# Patient Record
Sex: Female | Born: 1945 | Race: Black or African American | Hispanic: No | Smoking: Never smoker
Health system: Southern US, Community
[De-identification: ages and names within clinical notes are randomized; demographics above are authoritative.]

## PROBLEM LIST (undated history)

## (undated) DIAGNOSIS — T8859XA Other complications of anesthesia, initial encounter: Secondary | ICD-10-CM

## (undated) DIAGNOSIS — K2970 Gastritis, unspecified, without bleeding: Secondary | ICD-10-CM

## (undated) DIAGNOSIS — G971 Other reaction to spinal and lumbar puncture: Secondary | ICD-10-CM

## (undated) DIAGNOSIS — F985 Adult onset fluency disorder: Secondary | ICD-10-CM

## (undated) DIAGNOSIS — F329 Major depressive disorder, single episode, unspecified: Secondary | ICD-10-CM

## (undated) DIAGNOSIS — K469 Unspecified abdominal hernia without obstruction or gangrene: Secondary | ICD-10-CM

## (undated) DIAGNOSIS — Z7409 Other reduced mobility: Secondary | ICD-10-CM

## (undated) DIAGNOSIS — Z9289 Personal history of other medical treatment: Secondary | ICD-10-CM

## (undated) DIAGNOSIS — E669 Obesity, unspecified: Secondary | ICD-10-CM

## (undated) DIAGNOSIS — A048 Other specified bacterial intestinal infections: Secondary | ICD-10-CM

## (undated) DIAGNOSIS — E119 Type 2 diabetes mellitus without complications: Secondary | ICD-10-CM

## (undated) DIAGNOSIS — R413 Other amnesia: Secondary | ICD-10-CM

## (undated) DIAGNOSIS — K635 Polyp of colon: Secondary | ICD-10-CM

## (undated) DIAGNOSIS — M1990 Unspecified osteoarthritis, unspecified site: Secondary | ICD-10-CM

## (undated) DIAGNOSIS — IMO0001 Reserved for inherently not codable concepts without codable children: Secondary | ICD-10-CM

## (undated) DIAGNOSIS — Z87442 Personal history of urinary calculi: Secondary | ICD-10-CM

## (undated) DIAGNOSIS — G4733 Obstructive sleep apnea (adult) (pediatric): Secondary | ICD-10-CM

## (undated) DIAGNOSIS — K2270 Barrett's esophagus without dysplasia: Secondary | ICD-10-CM

## (undated) DIAGNOSIS — M7022 Olecranon bursitis, left elbow: Secondary | ICD-10-CM

## (undated) DIAGNOSIS — I2510 Atherosclerotic heart disease of native coronary artery without angina pectoris: Secondary | ICD-10-CM

## (undated) DIAGNOSIS — M810 Age-related osteoporosis without current pathological fracture: Secondary | ICD-10-CM

## (undated) DIAGNOSIS — T4145XA Adverse effect of unspecified anesthetic, initial encounter: Secondary | ICD-10-CM

## (undated) DIAGNOSIS — I471 Supraventricular tachycardia, unspecified: Secondary | ICD-10-CM

## (undated) DIAGNOSIS — D649 Anemia, unspecified: Secondary | ICD-10-CM

## (undated) DIAGNOSIS — H409 Unspecified glaucoma: Secondary | ICD-10-CM

## (undated) DIAGNOSIS — M1991 Primary osteoarthritis, unspecified site: Secondary | ICD-10-CM

## (undated) DIAGNOSIS — G629 Polyneuropathy, unspecified: Secondary | ICD-10-CM

## (undated) DIAGNOSIS — D509 Iron deficiency anemia, unspecified: Secondary | ICD-10-CM

## (undated) DIAGNOSIS — G4730 Sleep apnea, unspecified: Secondary | ICD-10-CM

## (undated) DIAGNOSIS — F32A Depression, unspecified: Secondary | ICD-10-CM

## (undated) DIAGNOSIS — K5790 Diverticulosis of intestine, part unspecified, without perforation or abscess without bleeding: Secondary | ICD-10-CM

## (undated) DIAGNOSIS — I10 Essential (primary) hypertension: Secondary | ICD-10-CM

## (undated) DIAGNOSIS — F419 Anxiety disorder, unspecified: Secondary | ICD-10-CM

## (undated) HISTORY — PX: EXTRACORPOREAL SHOCK WAVE LITHOTRIPSY: SHX1557

## (undated) HISTORY — DX: Obesity, unspecified: E66.9

## (undated) HISTORY — DX: Olecranon bursitis, left elbow: M70.22

## (undated) HISTORY — DX: Unspecified osteoarthritis, unspecified site: M19.90

## (undated) HISTORY — DX: Other specified bacterial intestinal infections: A04.8

## (undated) HISTORY — DX: Adult onset fluency disorder: F98.5

## (undated) HISTORY — PX: UMBILICAL HERNIA REPAIR: SHX196

## (undated) HISTORY — DX: Obstructive sleep apnea (adult) (pediatric): G47.33

## (undated) HISTORY — DX: Type 2 diabetes mellitus without complications: E11.9

## (undated) HISTORY — DX: Age-related osteoporosis without current pathological fracture: M81.0

## (undated) HISTORY — DX: Diverticulosis of intestine, part unspecified, without perforation or abscess without bleeding: K57.90

## (undated) HISTORY — DX: Barrett's esophagus without dysplasia: K22.70

## (undated) HISTORY — DX: Primary osteoarthritis, unspecified site: M19.91

## (undated) HISTORY — DX: Other amnesia: R41.3

## (undated) HISTORY — PX: TUBAL LIGATION: SHX77

## (undated) HISTORY — DX: Atherosclerotic heart disease of native coronary artery without angina pectoris: I25.10

## (undated) HISTORY — DX: Morbid (severe) obesity due to excess calories: E66.01

## (undated) HISTORY — PX: OTHER SURGICAL HISTORY: SHX169

## (undated) HISTORY — DX: Supraventricular tachycardia: I47.1

## (undated) HISTORY — DX: Essential (primary) hypertension: I10

## (undated) HISTORY — DX: Other reduced mobility: Z74.09

## (undated) HISTORY — DX: Gastritis, unspecified, without bleeding: K29.70

## (undated) HISTORY — PX: HERNIA REPAIR: SHX51

## (undated) HISTORY — PX: CHOLECYSTECTOMY: SHX55

## (undated) HISTORY — DX: Anemia, unspecified: D64.9

## (undated) HISTORY — DX: Supraventricular tachycardia, unspecified: I47.10

## (undated) HISTORY — PX: KNEE ARTHROSCOPY: SUR90

## (undated) HISTORY — DX: Iron deficiency anemia, unspecified: D50.9

## (undated) HISTORY — DX: Sleep apnea, unspecified: G47.30

## (undated) HISTORY — DX: Unspecified abdominal hernia without obstruction or gangrene: K46.9

## (undated) HISTORY — DX: Depression, unspecified: F32.A

## (undated) HISTORY — DX: Major depressive disorder, single episode, unspecified: F32.9

## (undated) HISTORY — DX: Polyp of colon: K63.5

## (undated) SURGERY — Surgical Case
Anesthesia: *Unknown

---

## 1984-01-14 DIAGNOSIS — G971 Other reaction to spinal and lumbar puncture: Secondary | ICD-10-CM

## 1984-01-14 HISTORY — DX: Other reaction to spinal and lumbar puncture: G97.1

## 2001-01-13 HISTORY — PX: CARDIAC CATHETERIZATION: SHX172

## 2001-03-21 ENCOUNTER — Inpatient Hospital Stay (HOSPITAL_COMMUNITY): Admission: EM | Admit: 2001-03-21 | Discharge: 2001-03-24 | Payer: Self-pay | Admitting: Emergency Medicine

## 2001-03-21 ENCOUNTER — Encounter: Payer: Self-pay | Admitting: Emergency Medicine

## 2005-11-18 ENCOUNTER — Encounter: Admission: RE | Admit: 2005-11-18 | Discharge: 2005-12-22 | Payer: Self-pay | Admitting: Orthopedic Surgery

## 2005-12-23 ENCOUNTER — Encounter: Admission: RE | Admit: 2005-12-23 | Discharge: 2006-01-14 | Payer: Self-pay | Admitting: Orthopedic Surgery

## 2006-01-15 ENCOUNTER — Encounter: Admission: RE | Admit: 2006-01-15 | Discharge: 2006-03-31 | Payer: Self-pay | Admitting: Orthopedic Surgery

## 2006-03-13 ENCOUNTER — Encounter: Admission: RE | Admit: 2006-03-13 | Discharge: 2006-03-31 | Payer: Self-pay | Admitting: Orthopedic Surgery

## 2006-08-04 ENCOUNTER — Ambulatory Visit: Payer: Self-pay | Admitting: Physical Medicine & Rehabilitation

## 2006-08-04 ENCOUNTER — Encounter
Admission: RE | Admit: 2006-08-04 | Discharge: 2006-11-02 | Payer: Self-pay | Admitting: Physical Medicine & Rehabilitation

## 2006-09-17 ENCOUNTER — Ambulatory Visit: Payer: Self-pay | Admitting: Physical Medicine & Rehabilitation

## 2006-10-26 ENCOUNTER — Ambulatory Visit: Payer: Self-pay | Admitting: Internal Medicine

## 2006-10-27 ENCOUNTER — Encounter: Payer: Self-pay | Admitting: Internal Medicine

## 2006-10-27 ENCOUNTER — Ambulatory Visit: Payer: Self-pay | Admitting: Internal Medicine

## 2006-11-23 ENCOUNTER — Ambulatory Visit (HOSPITAL_COMMUNITY): Admission: RE | Admit: 2006-11-23 | Discharge: 2006-11-23 | Payer: Self-pay | Admitting: General Surgery

## 2006-11-23 ENCOUNTER — Encounter (HOSPITAL_BASED_OUTPATIENT_CLINIC_OR_DEPARTMENT_OTHER): Payer: Self-pay | Admitting: General Surgery

## 2006-12-15 DIAGNOSIS — M170 Bilateral primary osteoarthritis of knee: Secondary | ICD-10-CM | POA: Insufficient documentation

## 2006-12-15 DIAGNOSIS — I2510 Atherosclerotic heart disease of native coronary artery without angina pectoris: Secondary | ICD-10-CM | POA: Insufficient documentation

## 2007-03-01 DIAGNOSIS — F330 Major depressive disorder, recurrent, mild: Secondary | ICD-10-CM | POA: Insufficient documentation

## 2007-03-25 DIAGNOSIS — D126 Benign neoplasm of colon, unspecified: Secondary | ICD-10-CM | POA: Insufficient documentation

## 2007-03-25 DIAGNOSIS — F32A Depression, unspecified: Secondary | ICD-10-CM | POA: Insufficient documentation

## 2007-03-25 DIAGNOSIS — Z87442 Personal history of urinary calculi: Secondary | ICD-10-CM | POA: Insufficient documentation

## 2007-03-25 DIAGNOSIS — IMO0001 Reserved for inherently not codable concepts without codable children: Secondary | ICD-10-CM | POA: Insufficient documentation

## 2007-03-25 DIAGNOSIS — F329 Major depressive disorder, single episode, unspecified: Secondary | ICD-10-CM

## 2007-03-25 DIAGNOSIS — I2510 Atherosclerotic heart disease of native coronary artery without angina pectoris: Secondary | ICD-10-CM | POA: Insufficient documentation

## 2007-03-25 DIAGNOSIS — E1149 Type 2 diabetes mellitus with other diabetic neurological complication: Secondary | ICD-10-CM

## 2007-03-25 DIAGNOSIS — I10 Essential (primary) hypertension: Secondary | ICD-10-CM

## 2007-03-25 DIAGNOSIS — K649 Unspecified hemorrhoids: Secondary | ICD-10-CM | POA: Insufficient documentation

## 2007-03-25 DIAGNOSIS — K5730 Diverticulosis of large intestine without perforation or abscess without bleeding: Secondary | ICD-10-CM | POA: Insufficient documentation

## 2007-03-25 DIAGNOSIS — G4730 Sleep apnea, unspecified: Secondary | ICD-10-CM

## 2007-03-25 DIAGNOSIS — F3289 Other specified depressive episodes: Secondary | ICD-10-CM | POA: Insufficient documentation

## 2007-03-25 DIAGNOSIS — G4733 Obstructive sleep apnea (adult) (pediatric): Secondary | ICD-10-CM | POA: Insufficient documentation

## 2007-03-31 ENCOUNTER — Encounter: Admission: RE | Admit: 2007-03-31 | Discharge: 2007-03-31 | Payer: Self-pay | Admitting: Family Medicine

## 2007-04-07 DIAGNOSIS — N3946 Mixed incontinence: Secondary | ICD-10-CM | POA: Insufficient documentation

## 2007-05-21 DIAGNOSIS — E1122 Type 2 diabetes mellitus with diabetic chronic kidney disease: Secondary | ICD-10-CM | POA: Insufficient documentation

## 2007-05-29 ENCOUNTER — Encounter: Admission: RE | Admit: 2007-05-29 | Discharge: 2007-05-29 | Payer: Self-pay | Admitting: Family Medicine

## 2008-04-04 ENCOUNTER — Encounter: Admission: RE | Admit: 2008-04-04 | Discharge: 2008-04-04 | Payer: Self-pay | Admitting: Obstetrics & Gynecology

## 2008-04-11 ENCOUNTER — Encounter: Admission: RE | Admit: 2008-04-11 | Discharge: 2008-04-11 | Payer: Self-pay | Admitting: Obstetrics & Gynecology

## 2008-07-31 ENCOUNTER — Encounter
Admission: RE | Admit: 2008-07-31 | Discharge: 2008-10-29 | Payer: Self-pay | Admitting: Physical Medicine & Rehabilitation

## 2008-08-02 ENCOUNTER — Ambulatory Visit: Payer: Self-pay | Admitting: Physical Medicine & Rehabilitation

## 2008-09-06 ENCOUNTER — Ambulatory Visit: Payer: Self-pay | Admitting: Physical Medicine & Rehabilitation

## 2008-09-13 ENCOUNTER — Encounter
Admission: RE | Admit: 2008-09-13 | Discharge: 2008-12-12 | Payer: Self-pay | Admitting: Physical Medicine & Rehabilitation

## 2008-11-28 ENCOUNTER — Encounter
Admission: RE | Admit: 2008-11-28 | Discharge: 2009-01-04 | Payer: Self-pay | Admitting: Physical Medicine & Rehabilitation

## 2008-11-29 ENCOUNTER — Ambulatory Visit: Payer: Self-pay | Admitting: Physical Medicine & Rehabilitation

## 2009-03-02 ENCOUNTER — Encounter
Admission: RE | Admit: 2009-03-02 | Discharge: 2009-05-31 | Payer: Self-pay | Admitting: Physical Medicine & Rehabilitation

## 2009-04-05 ENCOUNTER — Encounter: Admission: RE | Admit: 2009-04-05 | Discharge: 2009-04-05 | Payer: Self-pay | Admitting: Obstetrics & Gynecology

## 2009-04-06 ENCOUNTER — Ambulatory Visit: Payer: Self-pay | Admitting: Physical Medicine & Rehabilitation

## 2009-04-11 ENCOUNTER — Encounter
Admission: RE | Admit: 2009-04-11 | Discharge: 2009-07-05 | Payer: Self-pay | Admitting: Physical Medicine & Rehabilitation

## 2009-06-29 ENCOUNTER — Encounter
Admission: RE | Admit: 2009-06-29 | Discharge: 2009-07-06 | Payer: Self-pay | Admitting: Physical Medicine & Rehabilitation

## 2009-07-06 ENCOUNTER — Ambulatory Visit: Payer: Self-pay | Admitting: Physical Medicine & Rehabilitation

## 2009-10-25 ENCOUNTER — Encounter
Admission: RE | Admit: 2009-10-25 | Discharge: 2010-01-23 | Payer: Self-pay | Source: Home / Self Care | Attending: Physical Medicine & Rehabilitation | Admitting: Physical Medicine & Rehabilitation

## 2009-11-02 ENCOUNTER — Ambulatory Visit: Payer: Self-pay | Admitting: Physical Medicine & Rehabilitation

## 2009-11-16 ENCOUNTER — Ambulatory Visit: Payer: Self-pay | Admitting: Physical Medicine & Rehabilitation

## 2009-11-20 ENCOUNTER — Ambulatory Visit (HOSPITAL_COMMUNITY)
Admission: RE | Admit: 2009-11-20 | Discharge: 2009-11-20 | Payer: Self-pay | Source: Home / Self Care | Admitting: Physical Medicine & Rehabilitation

## 2010-02-03 ENCOUNTER — Encounter: Payer: Self-pay | Admitting: Family Medicine

## 2010-02-03 ENCOUNTER — Encounter: Payer: Self-pay | Admitting: Obstetrics & Gynecology

## 2010-03-04 ENCOUNTER — Other Ambulatory Visit: Payer: Self-pay | Admitting: *Deleted

## 2010-03-04 DIAGNOSIS — Z1231 Encounter for screening mammogram for malignant neoplasm of breast: Secondary | ICD-10-CM

## 2010-03-05 ENCOUNTER — Encounter: Payer: Medicare Other | Attending: Physical Medicine & Rehabilitation

## 2010-03-05 ENCOUNTER — Ambulatory Visit: Payer: Medicare Other | Admitting: Physical Medicine & Rehabilitation

## 2010-03-05 DIAGNOSIS — M1610 Unilateral primary osteoarthritis, unspecified hip: Secondary | ICD-10-CM

## 2010-03-05 DIAGNOSIS — G8929 Other chronic pain: Secondary | ICD-10-CM | POA: Insufficient documentation

## 2010-03-05 DIAGNOSIS — M1710 Unilateral primary osteoarthritis, unspecified knee: Secondary | ICD-10-CM

## 2010-03-05 DIAGNOSIS — M719 Bursopathy, unspecified: Secondary | ICD-10-CM | POA: Insufficient documentation

## 2010-03-05 DIAGNOSIS — M7520 Bicipital tendinitis, unspecified shoulder: Secondary | ICD-10-CM | POA: Insufficient documentation

## 2010-03-05 DIAGNOSIS — E669 Obesity, unspecified: Secondary | ICD-10-CM

## 2010-03-05 DIAGNOSIS — F329 Major depressive disorder, single episode, unspecified: Secondary | ICD-10-CM

## 2010-03-05 DIAGNOSIS — M67919 Unspecified disorder of synovium and tendon, unspecified shoulder: Secondary | ICD-10-CM | POA: Insufficient documentation

## 2010-04-15 ENCOUNTER — Ambulatory Visit: Payer: Medicare Other

## 2010-04-17 ENCOUNTER — Inpatient Hospital Stay: Admission: RE | Admit: 2010-04-17 | Payer: Medicare Other | Source: Ambulatory Visit

## 2010-05-28 NOTE — Assessment & Plan Note (Signed)
Jade Ellison is an old patient of mine, who had treated previously for back  and hip pain.  She had been seeing Dr. Dorene Grebe for orthopedic  management of her knee and hips.  Dr. August Saucer had mentioned that it was too  risky to try knee replacement surgery for her and did not have much to  offer her from an orthopedic standpoint.  They tried injections with  little relief.  She is back here and seeking other options for pain  control.  Pain in her knees are most prominent in the left than the  right as well as right hip pain.  The hip and knees are sharp and aching  in nature.  Pain worsens with walking, bending, and prolonged standing.  She feels very limited with her activities.  She has tried to lose  weight and has lost 10 pounds over the last few months.  Her mood has  been poor as she has been stressed at home and she is fed up with her  situation.   The patient states that she can walk about 10 minutes at a time.  She  uses a cane or walker for balance.  She rarely gets out of the  house so  she is afraid of how her appearance is to others.  She notes occasional  bladder control issues.  Pain interferes with general activity,  relations with others, and enjoyment of life on a moderate-to-severe  level as a whole.   CURRENT MEDICATIONS:  1. HCTZ and Actos, which is on hold.  2. Metformin.  3. Crestor.  4. Travatan drops.  5. Amlodipine.  6. Benazepril.  7. Hydrocodone 5/500 one to two per day p.r.n.  8. Aspirin.  9. Omega-3 supplement.   REVIEW OF SYSTEMS:  Notable for depression, anxiety, trouble walking,  and bladder control issues.  Full review is in the written health and  history section of the chart.   PAST MEDICAL HISTORY:  Positive for hypertension, glaucoma, non-insulin  requiring diabetes, arthritis, obesity, and hemorrhoids with removal of  kidney stones.   SOCIAL HISTORY:  The patient is widowed and lives alone.  She did not  disclosed some of her family problems  today.   FAMILY HISTORY:  Positive diabetes, high blood pressure, and heart  disease.   PHYSICAL EXAMINATION:  VITAL SIGNS:  Blood pressure is 129/57, pulse is  91, and respiratory rate is 18.  She is sating 99% on room air.  GENERAL:  The patient is generally pleasant, alert, and oriented x3.  She is obese.  EXTREMITIES:  On examination of the leg, she had external rotation of  the left femur with mild valgus deformity as well.  Less deformity was  noted in the right knee.  Both knees were painful somewhat with resisted  extension and flexion exercises today.  Right hip was painful with  movement particularly with Luisa Hart maneuver.  Strength seemed to be  fairly well preserved except at the left knee, where strength was 4-  4+/5.  There was definitely pain inhibition there.  Strength in the  upper extremities is 5/5.  The patient walk with antalgic gait favoring  both limbs really today.  She had difficulty transferring from a lying  to sitting position and sitting to standing position.  She used a cane  for balance.  HEART:  Regular.  CHEST:  Clear.  ABDOMEN:  Soft, nontender.  The patient was alert and appropriate.  She  is well dressed.  Did  not test her back and leg today for range of  motion or pain.  Both knees had some crepitus with movement, but no  structural instability that I could see on provocative maneuvers today.   ASSESSMENT:  1. Bilateral knee pain and osteoarthritis.  2. Osteoarthritis, right hip.  3. Morbid obesity.  4. Depression.  5. Non-insulin requiring diabetes.   PLAN:  1. Obviously the first thing that is most important for her is to lose      weight.  She is aware of this and at least wants to loose weight.      We will start by sending her to [facility] for diet      and weight loss suggestion and plan.  2. We will initiate low-dose Mobic 7.5 mg daily for any inflammatory      effects to see if we can reduce her pain levels and thus  increase      the activity.  3. I encouraged her to use her hydrocodone for breakthrough pain and      perhaps schedule a pill prior to her morning exercise.  4. I recommended Glucosamine chondroitin supplements.  She may take      her Omega-3 fatty acids as well.  5. The patient may do well with osteoarthritis braces for the knees to      help unload them.  6. Recommended ongoing aquatic pool-based therapy to help increase her      activity while unloading the knees.  She might do well with a      stationary bike as well.  7. I will see her back in 4-6 week's time.      Ranelle Oyster, M.D.  Electronically Signed     ZTS/MedQ  D:  08/02/2008 12:37:57  T:  08/03/2008 01:59:24  Job #:  [id number]   cc:   G. Dorene Grebe, M.D.  Fax: [phone]

## 2010-05-28 NOTE — Assessment & Plan Note (Signed)
Jade Ellison is back regarding her chronic knee and back pain.  She has done  a bit better with the Mobic and glucosamine supplements.  She is  enrolled in weight loss course next week and she has been working on  better diet.  She has lost a bit of weight.  She uses hydrocodone for  more severe pain, but tries to be sparing with it due to the  constipation side effects.  Her pain is 7-8/10.  Pain interferes with  general activity, relations with others, enjoyment of life on a moderate  level.  Sleep is fair.   REVIEW OF SYSTEMS:  Notable for trouble walking, depression, anxiety.  Full 14-point review is in the written health and history section of the  chart.   SOCIAL HISTORY:  Unchanged.  She did go on a trip with her daughter  requiring walking and this was tough for her.   PHYSICAL EXAMINATION:  VITAL SIGNS:  Blood pressure is 125/71, pulse is  87, respiratory rate 18.  She is sating 98% on room air.  GENERAL:  The patient is pleasant, alert, and oriented x3.  Affect is  bright and appropriate.  She remains overweight.  She uses  her cane for  gait and is antalgic bilaterally, but more on the left.  She has  significant valgus deformity of the left knee and pain over the medial  aspect.  Pain is more notable with flexion than extension today.  Strength is inhibited at knee extension and flexion due to pain and  rated strength there is 3-4/5.  Otherwise strength is in the 5/5 range.  Sensory exam is normal.  Cognitively, she is intact.  HEART:  Regular.  CHEST:  Clear.  ABDOMEN:  Soft, nontender.   ASSESSMENT:  1. Bilateral knee pain left greater than right with osteoarthritis.  2. Osteoarthritis, right hip.  3. Morbid obesity.  4. Diabetes type 2.   PLAN:  1. Continue weight loss and diet efforts.  She understands that this      is a long-term process.  She seems to have realistic goals.  2. Mobic 7.5 mg daily.  3. Hydrocodone 5/500 for breakthrough pain one daily p.r.n.  4.  Continue supplements.  5. We will send her through [facility] for osteoarthritis brace      with the medial brace for the left leg.  Then, this will be very      helpful for her.  6. I will see her back in about 3 months.      Ranelle Oyster, M.D.  Electronically Signed    ZTS/MedQ  D:  09/06/2008 10:51:36  T:  09/07/2008 03:08:04  Job #:  [id number]   cc:   G. Dorene Grebe, M.D.  Fax: [phone]

## 2010-05-28 NOTE — Op Note (Signed)
NAMETARRIE, MCMICHEN              ACCOUNT NO.:  [id number]   MEDICAL RECORD NO.:  [id number]          PATIENT TYPE:  AMB   LOCATION:  SDS                          FACILITY:  [facility]   PHYSICIAN:  Leonie Man, M.D.   DATE OF BIRTH:  [birth year]   DATE OF PROCEDURE:  11/23/2006  DATE OF DISCHARGE:                               OPERATIVE REPORT   PREOPERATIVE DIAGNOSIS:  Anal polyp involving internal hemorrhoid.   POSTOPERATIVE DIAGNOSIS:  Anal polyp involving internal hemorrhoid.   PROCEDURE:  Hemorrhoidectomy with polypectomy   SURGEON:  Leonie Man, M.D.   ASSISTANT:  O.R. nurse.   ANESTHESIA:  General.   The patient is a 65 year old morbidly obese female presenting with anal  drainage.  She has recently undergone colonoscopy and polypectomy.  She  presented with a prolapsed polyp coming through the anal verge.  She  comes to the operating room for excision after the risks and potential  benefits of surgery have been discussed, all questions answered, and  consent obtained.   PROCEDURE:  Following the induction of satisfactory general anesthesia,  the patient is positioned in lithotomy position and the perianal tissues  prepped and draped to be included in the sterile operative field.  Positive identification of the patient and the procedure is carried out.  The anal verge is dilated slightly to 2 fingerbreadths, and the  prolapsing polyp was grasped with a Pennington forceps, and the mucosal  prolapse is brought down out of the anus.  This area is then infiltrated  with 0.5% Marcaine with epinephrine.  Using electrocautery, the entire  hemorrhoid inclusive of the polyp is dissected away from the underlying  sphincters and removed for pathologic evaluation.  Hemostasis obtained  with electrocautery.  The mucosa and mucocutaneous junction is closed  with a running suture of 3-0 chromic catgut.  A Gelfoam pad is placed  within the anus over the incision line for additional  hemostasis.  Sponge and instrument counts are verified, the anesthetic reversed, and  the patient removed from the operating room to the recovery room in  stable condition.  She tolerated the procedure well.      Leonie Man, M.D.  Electronically Signed     PB/MEDQ  D:  11/23/2006  T:  11/23/2006  Job:  [id number]

## 2010-05-28 NOTE — Assessment & Plan Note (Signed)
[facility]                         GASTROENTEROLOGY OFFICE NOTE   LUJAIN, KRASZEWSKI                     MRN:          [id number]  DATE:10/26/2006                            DOB:          [birth year]    Ms. Vicens is a very nice 65 year old African American female who is  here today because of intermittent rectal bleeding and because she has  been evaluated by Dr. Leonie Man for hemorrhoidectomy and was told  to have a colonoscopy first.  Ms. Covell has been followed by Dr.  Tamela Oddi.  She has a coronary artery disease, diabetes mellitus on  oral hypoglycemic agents.  She has also high blood pressure and obesity.  She was diagnosed with sleep apnea and recently underwent lithotripsy.  She has 3-4 bowel movements a day which is quite different than it used  to be.  he is having more loose stools and urgent stools.  She is status  post cholecystectomy.  She has also total knee replacements in the past.   MEDICATIONS:  1. Zoloft 100 mg daily.  2. Metformin 1,000 mg daily.  3. Crestor 10 mg p.o. daily.  4. Quinapril 40 mg p.o. daily.  5. Actos 30 mg p.o. daily.  6. Tiazac 180 mg daily.  7. HCTZ 40 mg p.o. daily.  8. Xalatan drops.  9. Percocet.  10.Naprosyn.  11.Omega-3.  12.Glucosamine.   PAST HISTORY:  1. The patient had a colonoscopy at [facility] more than 10      years ago but does not know who did it and what was the outcome.      She also might have had an upper endoscopy years ago in      [place], [place].  2. Past history is significant for kidney stones.  3. High blood pressure.  4. Obesity.  5. Diabetes for 22 years.  6. Depression.  7. She had an open cholecystectomy in 1995.  8. Hernia surgery in 1984.  9. Tubal ligation in 1986.  10.She was found to have __________  of the arteries to her heart      after having a cardiac catheterization.  11.She also had scar tissue removed on her left lung.   FAMILY HISTORY:  Positive for diabetes in brother and sister, heart  disease in mother and father.   SOCIAL HISTORY:  She has one child.  She has some college education.  She does not smoke and drinks alcohol socially.   REVIEW OF SYSTEMS:  Positive for obesity, swelling of her feet, frequent  cough, severe fatigue, night sweats, excessive urination, blood in her  urine, shortness of breath.   PHYSICAL EXAMINATION:  VITAL SIGNS:  Blood pressure 114/70, pulse 60,  and weight was 300 pounds.  GENERAL:  She was quite obese, very nice, pleasant, alert and oriented.  EYES:  Sclerae nonicteric.  NECK:  Supple.  No adenopathy.  LUNGS:  Clear to auscultation.  COR:  Normal S1, normal S2.  ABDOMEN:  Protuberant, very obese, soft with post cholecystectomy scar  in the right upper quadrant.  There was no tenderness on her abdominal  exam which  was quite limited because of the enormous size it.  She had a  periumbilical hernia.  RECTAL:  Shows a prolapsing 2nd degree hemorrhoid which was quite  erythematous, injected, and had some contact bleeding.  It was reducible  by pushing it into the rectal os and rectal ampulla but it prolapsed  spontaneously again.  There was no stool in the rectal ampulla to check  for blood.  No other hemorrhoids were noted.  EXTREMITIES:  No edema.   IMPRESSION:  86. A 65 year old African American female with a change in bowel habits      which could be related to either her medications such as Metformin      or possibly due to post cholecystectomy state.  This could be due      to irritable bowel syndrome as well.  2. Rectal bleeding.  May be related to protruding 2nd degree      hemorrhoid but at her age of 31 she needs to be evaluated for colon      polyps/colon cancer.  3. Diabetes mellitus.  4. High blood pressure.  5. Obesity.  6. Coronary artery disease by history.  7. Sleep apnea.  8. Status post left sided lithotripsy.   PLAN:  1. Colonoscopy has  been discussed with the patient.  She will use the      routine colonoscopy prep with modification of her diabetic      medications.  2. Anusol-HC suppositories q.h.s.  3. Bentyl 10 mg p.o. b.i.d. to slow down her bowel movements.  4. Samples of Analpram cream 2.5% given to use for rectal irritation.   I will forward this report to Dr. Lurene Shadow whom she is supposed to see  back for a hemorrhoidectomy.     Hedwig Morton. Juanda Chance, MD  Electronically Signed    DMB/MedQ  DD: 10/26/2006  DT: 10/26/2006  Job #: [id number]   cc:   Roseanna Rainbow, M.D.  Leonie Man, M.D.  Veverly Fells. Al[place]imer, M.D.

## 2010-05-28 NOTE — Procedures (Signed)
NAMELILLAR, [name]              ACCOUNT NO.:  [id number]   MEDICAL RECORD NO.:  [id number]          PATIENT TYPE:  REC   LOCATION:  TPC                          FACILITY:  [facility]   PHYSICIAN:  Ranelle Oyster, M.D.DATE OF BIRTH:  1945/06/10   DATE OF PROCEDURE:  09/18/2006  DATE OF DISCHARGE:                               OPERATIVE REPORT   Jade Ellison is here for Synvisc injections.  ICD-9 code 715.96.   DESCRIPTION OF PROCEDURE:  After informed consent and preparation of the  skin with Betadine, we injected via lateral approach both knees using 2  mL of aqueous Synvisc solution.  The patient tolerated it well.  She was  given patient information regarding the medication itself.  I will see  her back in about 7-10 days for the second of three injections.      Ranelle Oyster, M.D.  Electronically Signed     ZTS/MEDQ  D:  09/18/2006 12:39:19  T:  09/18/2006 13:42:59  Job:  [id number]

## 2010-05-31 NOTE — Cardiovascular Report (Signed)
[place]  Patient:    Jade Ellison, Jade Ellison Visit Number: [id number] MRN: [id number]          Service Type: MED Location: 2000 2010 01 Attending Physician:  Nelta Numbers Dictated by:   Noralyn Pick. Eden Emms, M.D., [facility] Proc. Date: 03/23/01 Admit Date:  03/21/2001   CC:         Gabriel Earing, M.D.  Madolyn Frieze Crenshaw, M.D. [facility]   Cardiac Catheterization  INDICATION:  Recurrent chest pain in a diabetic relieved with nitroglycerin.  PROCEDURE:  Coronary Angiography.  CARDIOLOGIST:  Noralyn Pick. Eden Emms, M.D., [facility]  DESCRIPTION OF PROCEDURE:  Catheterization was done from the right femoral artery.  The patient had somewhat unusual anatomy.  The first inguinal crease was way below the femoral head using fluoroscopy.  Visually our stick seemed quite high, but I made sure we were over the femoral head by fluoroscopy.  RESULTS:  The left main coronary artery was normal.  Left anterior descending artery was normal.  First and second diagonal branches were normal.  The circumflex coronary artery was normal.  Right coronary artery had a 30% discrete lesion in the mid PDA.  For a diabetic, the patient had extremely large arteries.  RAO VENTRICULOGRAPHY:  RAO ventriculography was normal.  Ejection fraction was in the 65% range.  There was no gradient across the aortic valve and no MR. LV pressure was in the 160/18 range.  Aortic pressure was in the 170/68 range.  IMPRESSION:  The patients chest pain would appear to be noncardiac in etiology.  She actually has quite large arteries for a diabetic, and we will observe her for 24 hours since she is at high risk for retroperitoneal bleed given how deep her artery is and her size.  So long as she does not have any drop in her hemoglobin or bleeding, she will discharged in the morning. Dictated by:   Noralyn Pick Eden Emms, M.D., [facility] Attending Physician:  Nelta Numbers DD:   03/23/01 TD:  03/23/01 Job: [id number]

## 2010-05-31 NOTE — Discharge Summary (Signed)
Jade Ellison. [facility]  Patient:    Jade Ellison, Jade Ellison Visit Number: [id number] MRN: [id number]          Service Type: MED Location: 2000 2010 01 Attending Physician:  Nelta Numbers Dictated by:   Jade Ellison, P.A. Admit Date:  03/21/2001 Discharge Date: 03/24/2001   CC:         Dr. Enzo Ellison at [facility]  Dr. Andi Ellison at [facility]   Referring Physician Discharge [facility]  DATE OF BIRTH:  Dec 31, 1945  PROCEDURES: 1. Cardiac catheterization. 2. Coronary arteriogram. 3. Left ventriculogram.  HOSPITAL COURSE:  [name] is a 64 year old female with no known history of coronary artery disease who has multiple risk factors for coronary artery disease and presented to the emergency room with substernal chest pain that radiated to her right neck.  This occurred at approximately 2:45 a.m. on the morning of March 21, 2001.  The patient was seen in the emergency room and admitted to rule out MI and for further evaluation.  She was started on aspirin, heparin, and Zocor was added to her medication regimen.  She also had a low-dose beta blocker added as well.  Her enzymes were negative for MI and she was scheduled for a cardiac catheterization.  The cardiac catheterization was performed on March 23, 2001.  It showed normal coronary arteries with a normal LV and an EF of 55%.  The next day, the patient was ambulating without difficulty and her groin was stable.  There was no further cardiac indicated at this time.  The patient had some hematuria and a urinalysis and culture were done.  The urinalysis did show a urine that was positive for blood and a few epithelial cells as well as a few bacteria, so she was started on empiric antibiotics. The culture, though, came back with less than 10,000 colonies of multiple species felt contaminant and so the antibiotics were discontinued.  The patient had a lipid profile checked to  evaluate her for hyperlipidemia but this was in normal limits with an HDL of 48 and an LDL of 85.  It was felt that no therapy was indicated.  Because she had no further symptoms and her catheterization was without coronary artery disease, she was considered stable for discharge on March 24, 2001.  LABORATORY DATA:  Hemoglobin 13.2, hematocrit 39.4, wbcs 6.0, platelets 177. Total cholesterol 160, triglycerides 86, HDL 58, LDL 85.  Serial CK-MB a day. troponin I negative for MI.  LFTs within normal limits.  Amylase within normal limits.  Sodium 139, potassium 4.2, chloride 107, CO2 26, BUN 9, creatinine 0.7, glucose 153.  Chest x-ray:  No acute abnormalities.  DISCHARGE CONDITION:  Stable.  DISCHARGE DIAGNOSES: 1. Chest pain, no significant coronary artery disease by catheterization this    admission, symptoms resolved. 2. Non-insulin-dependent diabetes mellitus. 3. Hypertension. 4. Obesity. 5. Obstructive sleep apnea. 6. Status post cholecystectomy, cesarean section, total knee replacement, and    bilateral tubal ligation. 7. Family history of coronary artery disease.  DISCHARGE INSTRUCTIONS:  ACTIVITY:  Her activity level is to include no driving, sexual, or strenuous activity for two days.  DIET:  She is to stick to a low fat diabetic diet.  WOUND CARE:  She is to call the office for bleeding, swelling, or drainage at the catheterization site.  FOLLOW-UP:  She is to get a BMET in one week at family physicians office. She is to follow up with Dr. Glennon Ellison  on a p.r.n. basis.  She is to follow up with Dr. Enzo Ellison and Dr. Andi Ellison.  DISCHARGE MEDICATIONS: 1. Glucophage 500 mg b.i.d., restart March 26, 2001. 2. Prandin 2 mg t.i.d. 3. Amaryl 4 mg b.i.d. 4. HCTZ 12.5 mg q.d. 5. Topamax 50 mg q.d. 6. Lisinopril 20 mg q.d. 7. Tiazac 300 mg q.d. Dictated by:   Jade Ellison, P.A. Attending Physician:  Nelta Numbers DD:  03/24/01 TD:  03/24/01 Job:  [id number]

## 2010-06-07 ENCOUNTER — Ambulatory Visit: Payer: Medicare Other

## 2010-06-17 ENCOUNTER — Other Ambulatory Visit: Payer: Self-pay | Admitting: Family Medicine

## 2010-06-17 DIAGNOSIS — Z1231 Encounter for screening mammogram for malignant neoplasm of breast: Secondary | ICD-10-CM

## 2010-06-24 ENCOUNTER — Ambulatory Visit: Payer: Medicare Other

## 2010-07-01 ENCOUNTER — Encounter: Payer: Medicare Other | Attending: Physical Medicine & Rehabilitation | Admitting: Physical Medicine & Rehabilitation

## 2010-07-22 ENCOUNTER — Encounter: Payer: Medicare Other | Attending: Physical Medicine & Rehabilitation | Admitting: Physical Medicine & Rehabilitation

## 2010-07-22 DIAGNOSIS — F329 Major depressive disorder, single episode, unspecified: Secondary | ICD-10-CM

## 2010-07-22 DIAGNOSIS — M1710 Unilateral primary osteoarthritis, unspecified knee: Secondary | ICD-10-CM | POA: Insufficient documentation

## 2010-07-22 DIAGNOSIS — M1610 Unilateral primary osteoarthritis, unspecified hip: Secondary | ICD-10-CM

## 2010-07-22 DIAGNOSIS — E669 Obesity, unspecified: Secondary | ICD-10-CM

## 2010-07-22 DIAGNOSIS — M25569 Pain in unspecified knee: Secondary | ICD-10-CM | POA: Insufficient documentation

## 2010-07-22 DIAGNOSIS — M67919 Unspecified disorder of synovium and tendon, unspecified shoulder: Secondary | ICD-10-CM | POA: Insufficient documentation

## 2010-07-22 DIAGNOSIS — M719 Bursopathy, unspecified: Secondary | ICD-10-CM | POA: Insufficient documentation

## 2010-07-22 NOTE — Assessment & Plan Note (Signed)
Jade Ellison is back regarding her multiple pain issues, particularly the left knee.  She continues to have significant pain and is limited with knee flexion now.  She has hard time getting in her car.  The injection helped in November last year, but she has not had another one since. She uses the hydrocodone for some breakthrough pain relief.  The Voltaren gel and Pennsaid drops were no longer helping.  She does like her brace, but feels limited in its use due to her inability to get into car with it.  REVIEW OF SYSTEMS:  Notable for the above.  She does report some limb swelling, urine retention and depression.  Full 12-point review is written health and history section of the chart.  SOCIAL HISTORY:  The patient is widow, lives alone.  PHYSICAL EXAMINATION:  VITAL SIGNS:  Blood pressure is 147/53, pulse 101, respiratory rate 18 and she is satting 98% on room air. GENERAL:  The patient is pleasant and alert.  Her weight is unchanged. EXTREMITIES:  She has antalgia left more than right leg.  She has significant crepitus in the left knee.  I am unable to flex her past 70- 75 degrees. CARDIAC:  Heart rate is slightly tachycardic. CHEST:  Clear. ABDOMEN:  Soft and nontender.  ASSESSMENT: 1. 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     Arthritis is severe. 2. Right rotator cuff syndrome/bicipital tendonitis/bursitis. 3. Morbid obesity.  PLAN: 1. I discussed options including total knee replacement.  She would     like to hold off on this and work on her weight further.  Discussed     range of motion and appropriate shoe wear, use of her brace, etc. 2. I increased her hydrocodone to 7.5 to use q.6-8 h. p.r.n. 3. Discussed low impact activities such as pool walking, a stationary     bike, etc. which she can try. 4. I will see her back here in about 3 months.     Ranelle Oyster, M.D. Electronically Signed    ZTS/MedQ D:  07/22/2010 13:24:54  T:   07/22/2010 22:37:24  Job #:  [id number]  cc:   Devra Dopp, MD

## 2010-09-27 ENCOUNTER — Telehealth: Payer: Self-pay | Admitting: Internal Medicine

## 2010-09-27 NOTE — Telephone Encounter (Signed)
Patient scheduled on 10/02/10 at 9:15 AM. Lurena Joiner to fax records to [place].

## 2010-09-30 ENCOUNTER — Encounter: Payer: Self-pay | Admitting: *Deleted

## 2010-10-02 ENCOUNTER — Encounter: Payer: Self-pay | Admitting: *Deleted

## 2010-10-02 ENCOUNTER — Other Ambulatory Visit (INDEPENDENT_AMBULATORY_CARE_PROVIDER_SITE_OTHER): Payer: Medicare Other

## 2010-10-02 ENCOUNTER — Encounter: Payer: Self-pay | Admitting: Internal Medicine

## 2010-10-02 ENCOUNTER — Telehealth: Payer: Self-pay | Admitting: *Deleted

## 2010-10-02 ENCOUNTER — Ambulatory Visit (INDEPENDENT_AMBULATORY_CARE_PROVIDER_SITE_OTHER): Payer: Medicare Other | Admitting: Internal Medicine

## 2010-10-02 DIAGNOSIS — D649 Anemia, unspecified: Secondary | ICD-10-CM

## 2010-10-02 DIAGNOSIS — R1013 Epigastric pain: Secondary | ICD-10-CM

## 2010-10-02 DIAGNOSIS — R197 Diarrhea, unspecified: Secondary | ICD-10-CM

## 2010-10-02 DIAGNOSIS — R141 Gas pain: Secondary | ICD-10-CM

## 2010-10-02 DIAGNOSIS — K3189 Other diseases of stomach and duodenum: Secondary | ICD-10-CM

## 2010-10-02 DIAGNOSIS — R142 Eructation: Secondary | ICD-10-CM

## 2010-10-02 LAB — CBC WITH DIFFERENTIAL/PLATELET
Basophils Relative: 0.1 % (ref 0.0–3.0)
Eosinophils Relative: 0.8 % (ref 0.0–5.0)
HCT: 26.2 % — ABNORMAL LOW (ref 36.0–46.0)
Lymphs Abs: 0.9 10*3/uL (ref 0.7–4.0)
MCV: 57.9 fl — ABNORMAL LOW (ref 78.0–100.0)
Monocytes Absolute: 0.5 10*3/uL (ref 0.1–1.0)
RBC: 4.58 Mil/uL (ref 3.87–5.11)
WBC: 6 10*3/uL (ref 4.5–10.5)

## 2010-10-02 LAB — COMPREHENSIVE METABOLIC PANEL
Alkaline Phosphatase: 64 U/L (ref 39–117)
BUN: 24 mg/dL — ABNORMAL HIGH (ref 6–23)
Creatinine, Ser: 1 mg/dL (ref 0.4–1.2)
Glucose, Bld: 101 mg/dL — ABNORMAL HIGH (ref 70–99)
Total Bilirubin: 0.9 mg/dL (ref 0.3–1.2)

## 2010-10-02 MED ORDER — ALIGN PO CAPS: 1.0000 | ORAL_CAPSULE | Freq: Every day | ORAL | Status: DC

## 2010-10-02 MED ORDER — OMEPRAZOLE 20 MG PO CPDR: 20.0000 mg | DELAYED_RELEASE_CAPSULE | Freq: Every day | ORAL | Status: DC

## 2010-10-02 MED ORDER — METRONIDAZOLE 250 MG PO TABS: 250.0000 mg | ORAL_TABLET | Freq: Four times a day (QID) | ORAL | Status: AC

## 2010-10-02 NOTE — Telephone Encounter (Signed)
Per Dr Juanda Chance, patient needs blood transfusion. I have spoken to Sarah @ [facility] Short Stay and patient has been scheduled for type and cross and blood transfusion (2 units prbc) @ 8 am on 10/03/10. I have spoken to patient and have advised to her to be at [facility] @ 8 am 10/03/10. Orders have also been sent to [facility]. I have also scheduled patient for a previsit and endoscopy/colonoscopy as per Dr Regino Schultze recommendations and patient has been made aware of these dates and times as well.

## 2010-10-02 NOTE — Progress Notes (Signed)
See phone note from 10/03/10

## 2010-10-02 NOTE — Progress Notes (Signed)
Jade Ellison 03/06/1945 MRN [id number]    History of Present Illness:  This is a 65 year old African American female with a several week history of diarrhea which occurs during the day and occasionally at night. Her usual bowel habits are 2 bowel movements a day. She is now  having loose stools several times a day. There has been no blood. There has been a lot of indigestion, belching ,dyspepsia and even vomiting. She took amoxicillin twice a day from a dentist office 5-6 weeks ago. There is a remote history of an open cholecystectomy. She has been on metformin 1,000 mg twice a day for several years without having diarrhea. She takes Metamucil 2 teaspoon daily. There is no family history of colon cancer. A colonoscopy in October 2008 showed an anorectal polyp which was removed. She had mild diverticulosis.   Past Medical History  Diagnosis Date  . Colon polyp     polypoid colorectal mucosa  . Diverticulosis   . Depression   . Nephrolithiasis   . Sleep apnea   . Hypertension   . Obesity   . Diabetes mellitus   . Coronary artery disease   . Hemorrhoids   . Hernia of unspecified site of abdominal cavity without mention of obstruction or gangrene   . Arthritis    Past Surgical History  Procedure Date  . Left sided lithotripsy   . Cholecystectomy   . Knee arthroscopy   . Cardiac catheterization   . Hernia repair   . Tubal ligation     reports that she has never smoked. She does not have any smokeless tobacco history on file. She reports that she drinks alcohol. She reports that she does not use illicit drugs. family history includes Diabetes in her brother and sister; Heart disease in her father and mother; and Kidney cancer in her brother. Allergies  Allergen Reactions  . Codeine         Review of Systems: Positive for dysphagia and odynophagia as well as dyspepsia. Negative for constipation or rectal bleeding  The remainder of the 10  point ROS is negative except as  outlined in H&P   Physical Exam: General appearance  Well developed, in no distress, markedly obese. Eyes- non icteric HEENT nontraumatic, normocephalic. Mouth no lesions, tongue papillated, no cheilosis, no thrush. Neck supple without adenopathy, thyroid not enlarged, no carotid bruits, no JVD. Lungs Clear to auscultation bilaterally. Cor normal S1 normal S2, regular rhythm , no murmur,  quiet precordium. Abdomen massively obese, soft without tenderness. Normal active bowel sounds. Post-open cholecystectomy scar in right upper quadrant. No fluid wave. Rectal: Unable to do, patient could not get up on examining table. Extremities no pedal edema. Skin no lesions. Neurological alert and oriented x 3. Psychological normal mood and affect.  Assessment and Plan:  Problem #1 diarrhea. This seema to be multifactorial. Metformin can cause diarrhea. Post cholecystectomy state can cause choleretic diarrhea. A recent onset of diarrhea seems likely attributed to recent antibiotics for dental work. A C.Diff toxin was negative and stool studies were negative. We will treat her empirically with Flagyl 250 mg by mouth 4 times a day x 10 days. We will check a metabolic panel and CBC. I gave her samples of probiotics to take daily.  Problem  #2 dyspepsia. She has done a lot of belching. She is post remote cholecystectomy. We are checking her liver function tests. We will obtain an upper abdominal ultrasound and start her empirically on Prilosec 20 mg daily. If she  is not improved in 5-7 days, she will call us. We may then consider an upper endoscopy and colonoscopy.  10/02/2010 Jade Ellison

## 2010-10-02 NOTE — Patient Instructions (Signed)
We have sent the following medications to your pharmacy for you to pick up at your convenience: Flagyl 250 mg 1 tablet 4 times daily x 10 days. Prilosec 20 mg once daily. Your physician has requested that you go to the basement for the following lab work before leaving today: CMET, CBC You have been scheduled for an abdominal ultrasound at [facility] (1st floor of hospital) on Friday 10/04/10 at 11:00 am. Please arrive 15 minutes prior to your appointment for registration. Make certain not to have anything to eat or drink 6 hours prior to your appointment. Should you need to reschedule your appointment, please contact radiology at [phone]. CC: Dr Providence Lanius

## 2010-10-03 ENCOUNTER — Ambulatory Visit (HOSPITAL_COMMUNITY): Payer: Medicare Other | Attending: Internal Medicine

## 2010-10-03 ENCOUNTER — Other Ambulatory Visit: Payer: Self-pay | Admitting: Internal Medicine

## 2010-10-03 DIAGNOSIS — D509 Iron deficiency anemia, unspecified: Secondary | ICD-10-CM | POA: Insufficient documentation

## 2010-10-03 LAB — GLUCOSE, CAPILLARY: Glucose-Capillary: 81 mg/dL (ref 70–99)

## 2010-10-04 ENCOUNTER — Ambulatory Visit (HOSPITAL_COMMUNITY)
Admission: RE | Admit: 2010-10-04 | Discharge: 2010-10-04 | Disposition: A | Discharge status: Home / Self Care | Payer: Medicare Other | Source: Ambulatory Visit | Attending: Internal Medicine | Admitting: Internal Medicine

## 2010-10-04 DIAGNOSIS — R141 Gas pain: Secondary | ICD-10-CM | POA: Insufficient documentation

## 2010-10-04 DIAGNOSIS — K3189 Other diseases of stomach and duodenum: Secondary | ICD-10-CM | POA: Insufficient documentation

## 2010-10-04 DIAGNOSIS — R142 Eructation: Secondary | ICD-10-CM | POA: Insufficient documentation

## 2010-10-04 DIAGNOSIS — Z9089 Acquired absence of other organs: Secondary | ICD-10-CM | POA: Insufficient documentation

## 2010-10-04 DIAGNOSIS — R1013 Epigastric pain: Secondary | ICD-10-CM

## 2010-10-04 DIAGNOSIS — R197 Diarrhea, unspecified: Secondary | ICD-10-CM | POA: Insufficient documentation

## 2010-10-04 LAB — CROSSMATCH
Antibody Screen: NEGATIVE
Unit division: 0

## 2010-10-07 ENCOUNTER — Telehealth: Payer: Self-pay | Admitting: *Deleted

## 2010-10-07 NOTE — Telephone Encounter (Signed)
Message copied by Daphine Deutscher on Mon Oct 07, 2010 10:17 AM ------      Message from: [place], [place]      Created: Fri Oct 04, 2010  8:00 PM       Please call pt with normal ultrasound.

## 2010-10-07 NOTE — Telephone Encounter (Signed)
Patient given results as per Dr. Brodie 

## 2010-10-11 ENCOUNTER — Ambulatory Visit (AMBULATORY_SURGERY_CENTER): Payer: Medicare Other | Admitting: *Deleted

## 2010-10-11 ENCOUNTER — Telehealth: Payer: Self-pay | Admitting: Internal Medicine

## 2010-10-11 VITALS — Ht 64.0 in | Wt 294.1 lb

## 2010-10-11 DIAGNOSIS — D500 Iron deficiency anemia secondary to blood loss (chronic): Secondary | ICD-10-CM

## 2010-10-11 MED ORDER — PEG-KCL-NACL-NASULF-NA ASC-C 100 G PO SOLR: 1.0000 | Freq: Once | ORAL | Status: DC

## 2010-10-11 NOTE — Telephone Encounter (Signed)
Rx for MoviPrep called in to [facility] on [address].  Pt notified. Jade Ellison

## 2010-10-14 ENCOUNTER — Encounter: Payer: Self-pay | Admitting: Internal Medicine

## 2010-10-14 ENCOUNTER — Ambulatory Visit: Payer: Medicare Other | Admitting: Physical Medicine & Rehabilitation

## 2010-10-14 ENCOUNTER — Ambulatory Visit (AMBULATORY_SURGERY_CENTER): Payer: Medicare Other | Admitting: Internal Medicine

## 2010-10-14 DIAGNOSIS — K2270 Barrett's esophagus without dysplasia: Secondary | ICD-10-CM

## 2010-10-14 DIAGNOSIS — Z1211 Encounter for screening for malignant neoplasm of colon: Secondary | ICD-10-CM

## 2010-10-14 DIAGNOSIS — D500 Iron deficiency anemia secondary to blood loss (chronic): Secondary | ICD-10-CM

## 2010-10-14 DIAGNOSIS — K2970 Gastritis, unspecified, without bleeding: Secondary | ICD-10-CM

## 2010-10-14 DIAGNOSIS — Z86010 Personal history of colonic polyps: Secondary | ICD-10-CM

## 2010-10-14 DIAGNOSIS — K2940 Chronic atrophic gastritis without bleeding: Secondary | ICD-10-CM

## 2010-10-14 DIAGNOSIS — R197 Diarrhea, unspecified: Secondary | ICD-10-CM

## 2010-10-14 DIAGNOSIS — K2990 Gastroduodenitis, unspecified, without bleeding: Secondary | ICD-10-CM

## 2010-10-14 HISTORY — DX: Barrett's esophagus without dysplasia: K22.70

## 2010-10-14 LAB — GLUCOSE, CAPILLARY: Glucose-Capillary: 113 mg/dL — ABNORMAL HIGH (ref 70–99)

## 2010-10-14 MED ORDER — SODIUM CHLORIDE 0.9 % IV SOLN: 500.0000 mL | INTRAVENOUS | Status: DC

## 2010-10-14 NOTE — Patient Instructions (Signed)
Discharge instructions given with verbal understanding.  Handouts on gastritis,esophagitis,diverticulosis given.  Resume previous medications.

## 2010-10-15 ENCOUNTER — Telehealth: Payer: Self-pay | Admitting: *Deleted

## 2010-10-15 ENCOUNTER — Telehealth: Payer: Self-pay | Admitting: Internal Medicine

## 2010-10-15 NOTE — Telephone Encounter (Signed)
Please finish the Flagyl prescription which was  Written for 10 days. Please finish the Prilosec which was written for 30 days.

## 2010-10-15 NOTE — Telephone Encounter (Signed)
Patient wants to know if Dr. Juanda Chance wants her to continue her prilosec and flagyl.   IF so, she needs refills on both.   This note forwarded to Dr. Juanda Chance.

## 2010-10-15 NOTE — Telephone Encounter (Signed)

## 2010-10-17 ENCOUNTER — Telehealth: Payer: Self-pay | Admitting: *Deleted

## 2010-10-17 ENCOUNTER — Encounter: Payer: Self-pay | Admitting: Internal Medicine

## 2010-10-17 NOTE — Telephone Encounter (Signed)
Duplicated encounter.

## 2010-10-17 NOTE — Telephone Encounter (Signed)
Spoke with patient today, and patient was told to take her flagyl as ordered and her prilosec for 30 days.   She will call us if it helps.  Patient thanked me for calling her back.

## 2010-10-19 ENCOUNTER — Other Ambulatory Visit: Payer: Self-pay | Admitting: Internal Medicine

## 2010-10-22 LAB — COMPREHENSIVE METABOLIC PANEL
Albumin: 4.1
BUN: 15
Creatinine, Ser: 0.9
Total Bilirubin: 0.9
Total Protein: 6.7

## 2010-10-22 LAB — CBC
HCT: 35.6 — ABNORMAL LOW
MCV: 81.3
Platelets: 316
RDW: 16.3 — ABNORMAL HIGH

## 2010-10-22 LAB — DIFFERENTIAL
Basophils Absolute: 0
Lymphocytes Relative: 17
Monocytes Absolute: 0.5
Neutro Abs: 5.3
Neutrophils Relative %: 74

## 2010-10-23 ENCOUNTER — Other Ambulatory Visit: Payer: Self-pay | Admitting: Internal Medicine

## 2010-10-23 NOTE — Telephone Encounter (Signed)
Patient states that she has now completed the 10 day Flagyl course. She states that her stools are better consistency now. However, she still has 4-5 stools daily which still are not quite normal for her. She states that she stopped taking the Align that was originally recommended to her because she ran out. I have asked that she get some more Align over the counter (since she feels like that helped). I have also told her she may take Imodium OTC. However, she states that Imodium does not seem to help. Dr Juanda Chance- Do I need to send her any new prescriptions for continued problems?

## 2010-10-24 NOTE — Telephone Encounter (Signed)
Bentyl 20mg  po bid, #40, 1 refill. If she already has it, send Lomotil 1 po qd, #30,  1 refill.

## 2010-10-25 MED ORDER — DICYCLOMINE HCL 20 MG PO TABS: 20.0000 mg | ORAL_TABLET | Freq: Two times a day (BID) | ORAL | Status: DC

## 2010-10-25 NOTE — Telephone Encounter (Signed)
Patient states that she has never tried Bentyl. I have advised her that we will send script to her pharmacy for her to take twice daily. Patient verbalizes understanding.

## 2010-10-31 NOTE — Telephone Encounter (Signed)
See other phone message  

## 2010-11-15 ENCOUNTER — Encounter: Payer: Medicare Other | Attending: Physical Medicine & Rehabilitation | Admitting: Physical Medicine & Rehabilitation

## 2010-11-15 DIAGNOSIS — M67919 Unspecified disorder of synovium and tendon, unspecified shoulder: Secondary | ICD-10-CM | POA: Insufficient documentation

## 2010-11-15 DIAGNOSIS — M25559 Pain in unspecified hip: Secondary | ICD-10-CM | POA: Insufficient documentation

## 2010-11-15 DIAGNOSIS — M542 Cervicalgia: Secondary | ICD-10-CM | POA: Insufficient documentation

## 2010-11-15 DIAGNOSIS — K209 Esophagitis, unspecified without bleeding: Secondary | ICD-10-CM | POA: Insufficient documentation

## 2010-11-15 DIAGNOSIS — M79609 Pain in unspecified limb: Secondary | ICD-10-CM | POA: Insufficient documentation

## 2010-11-15 DIAGNOSIS — M25519 Pain in unspecified shoulder: Secondary | ICD-10-CM | POA: Insufficient documentation

## 2010-11-15 DIAGNOSIS — M7530 Calcific tendinitis of unspecified shoulder: Secondary | ICD-10-CM

## 2010-11-15 DIAGNOSIS — M1710 Unilateral primary osteoarthritis, unspecified knee: Secondary | ICD-10-CM | POA: Insufficient documentation

## 2010-11-15 DIAGNOSIS — IMO0001 Reserved for inherently not codable concepts without codable children: Secondary | ICD-10-CM | POA: Insufficient documentation

## 2010-11-15 DIAGNOSIS — F329 Major depressive disorder, single episode, unspecified: Secondary | ICD-10-CM

## 2010-11-15 DIAGNOSIS — E669 Obesity, unspecified: Secondary | ICD-10-CM

## 2010-11-15 DIAGNOSIS — K279 Peptic ulcer, site unspecified, unspecified as acute or chronic, without hemorrhage or perforation: Secondary | ICD-10-CM | POA: Insufficient documentation

## 2010-11-15 DIAGNOSIS — M7520 Bicipital tendinitis, unspecified shoulder: Secondary | ICD-10-CM | POA: Insufficient documentation

## 2010-11-15 DIAGNOSIS — M719 Bursopathy, unspecified: Secondary | ICD-10-CM | POA: Insufficient documentation

## 2010-11-15 NOTE — Assessment & Plan Note (Signed)
HISTORY:  Jade Ellison is back regarding her multiple pain complaints. Unfortunately she suffered some gastritis and esophagitis, requiring blood transfusion I believe admission to the hospital.  She has lost some weight, as result had some more pain afterwards.  Pain is most prominent in the right leg as well as her hips and left shoulder.  Neck has also been a big problem  over the last few weeks and bothersome more when she rotates to either side.  Pain is about 9/10 today.  REVIEW OF SYSTEMS:  Notable for trouble walking, depression, urine retention, poor appetite, sleep apnea.  Full 12-point review is in the written health and history section of the chart.  SOCIAL HISTORY:  The patient is widowed, lives alone.  PHYSICAL EXAMINATION:  VITAL SIGNS:  Blood pressure is 151/76, pulse 89, respiratory rate 18, and she is satting 97% on room air. GENERAL:  The patient is pleasant, alert.  She in some distress but as appropriate otherwise cognitively.  Both shoulders are notable for rotator cuff impingement signs particularly in the left arm today.  Both the sternocleidomastoids are tender particularly in the middle and upper 3rd and somewhat tight with palpation.  She had pain with rotation, lateral bending to either side.  Strength is generally preserved except for pain inhibition in the upper extremities.  Right knee remains tender with resisted flexion.  It is a significantly difficult time transferring from sit to stand, due to knee pain. HEART:  Regular. CHEST:  Clear. ABDOMEN:  Soft and nontender.  ASSESSMENT: 1. 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 2. Right rotator cuff syndrome with bicipital tendonitis and bursitis.     The patient now is left-sided symptoms also. 3. Morbid obesity. 4. Myofascial cervical pain. 5. Peptic ulcer disease/esophagitis.  PLAN: 1. I asked the patient immediately stopped for Relafen which was not     stopped for some reason  by her gastroenterologist. 2. We will use the Robaxin for spasm. 3. I will send the patient for physical therapy to work on cervical     range of motion as well as rotator cuff exercises scapular     stabilization.  She likely will need some type of interventional     assistance with these problems in my opinion, although we will try     therapy first. 4. Refill hydrocodone 7.5/325 #75 today. 5. Add Voltaren gel again to the regimen 1% t.i.d. to the left     bilateral shoulder girdles and neck #3 tubes. 6. I will see her back here in about a month.     Jade Ellison, M.D. Electronically Signed    ZTS/MedQ D:  11/15/2010 13:27:53  T:  11/15/2010 14:54:41  Job #:  [id number]  cc:   Devra Dopp, MD

## 2010-11-20 ENCOUNTER — Ambulatory Visit: Payer: Medicare Other

## 2010-11-27 ENCOUNTER — Ambulatory Visit: Payer: Medicare Other | Admitting: Physical Therapy

## 2010-12-18 ENCOUNTER — Ambulatory Visit: Payer: Medicare Other

## 2010-12-20 ENCOUNTER — Ambulatory Visit: Payer: Medicare Other

## 2010-12-26 ENCOUNTER — Other Ambulatory Visit: Payer: Self-pay | Admitting: Internal Medicine

## 2010-12-27 ENCOUNTER — Encounter: Payer: Medicare Other | Admitting: Physical Medicine & Rehabilitation

## 2011-01-27 ENCOUNTER — Other Ambulatory Visit: Payer: Self-pay | Admitting: Internal Medicine

## 2011-02-14 ENCOUNTER — Encounter: Payer: Medicare Other | Attending: Neurosurgery | Admitting: Neurosurgery

## 2011-02-14 DIAGNOSIS — IMO0001 Reserved for inherently not codable concepts without codable children: Secondary | ICD-10-CM | POA: Insufficient documentation

## 2011-02-14 DIAGNOSIS — M542 Cervicalgia: Secondary | ICD-10-CM

## 2011-02-14 DIAGNOSIS — M1710 Unilateral primary osteoarthritis, unspecified knee: Secondary | ICD-10-CM | POA: Insufficient documentation

## 2011-02-14 DIAGNOSIS — M25519 Pain in unspecified shoulder: Secondary | ICD-10-CM | POA: Insufficient documentation

## 2011-02-14 DIAGNOSIS — M25569 Pain in unspecified knee: Secondary | ICD-10-CM | POA: Insufficient documentation

## 2011-02-14 DIAGNOSIS — M67919 Unspecified disorder of synovium and tendon, unspecified shoulder: Secondary | ICD-10-CM | POA: Insufficient documentation

## 2011-02-14 DIAGNOSIS — M719 Bursopathy, unspecified: Secondary | ICD-10-CM | POA: Insufficient documentation

## 2011-02-15 NOTE — Assessment & Plan Note (Signed)
This is a patient of Dr. Riley Kill, seen for right shoulder pain and bilateral knee pain.  She reports pain unchanged at 8-9.  It is constant.  General activity level is 8.  Pain is worse at night.  Sleep patterns are fair.  Walking, bending, standing activity aggravate; rest, heat, and medication helps.  She walks without assistance.  She uses a cane or walker.  She can walk about 15 minutes at a time.  Functionally, she is on disability.  REVIEW OF SYSTEMS:  Notable for difficulties as described above as well as some bladder control issues, trouble walking, limb swelling, painful urination, weight gain, sleep apnea.  Past medical history, social history, and family history are unchanged.  PHYSICAL EXAMINATION:  VITAL SIGNS:  Her blood pressure is 145/61, pulse 99, respirations 18, O2 sats 98 on room air. MUSCULOSKELETAL:  Sensation is intact in upper and lower extremities due to her size and strength, does seems hard to complete. NEUROLOGIC: Constitutionally, she is morbidly obese.  She is alert and oriented x3. She has a significant limp.  ASSESSMENT: 1. History of bilateral knee osteoarthritis. 2. Right rotator cuff syndrome. 3. Morbid obesity. 4. Myofascial cervical pain.  PLAN:  Refill hydrocodone 7.5/325, one p.o. q.8 hours p.r.n. 75 with no refills.  Her questions were encouraged and answered.  She will follow up here in a month.     Jade Ellison Electronically Signed    RLW/MedQ D:  02/14/2011 14:15:58  T:  02/15/2011 04:30:46  Job #:  [id number]

## 2011-02-26 ENCOUNTER — Ambulatory Visit: Payer: Medicare Other

## 2011-03-14 ENCOUNTER — Encounter: Payer: Self-pay | Admitting: *Deleted

## 2011-03-14 ENCOUNTER — Encounter: Payer: Medicare Other | Attending: Physical Medicine & Rehabilitation | Admitting: *Deleted

## 2011-03-14 VITALS — BP 147/68 | HR 98 | Resp 18 | Ht 64.0 in | Wt 300.0 lb

## 2011-03-14 DIAGNOSIS — IMO0001 Reserved for inherently not codable concepts without codable children: Secondary | ICD-10-CM | POA: Insufficient documentation

## 2011-03-14 DIAGNOSIS — M542 Cervicalgia: Secondary | ICD-10-CM | POA: Insufficient documentation

## 2011-03-14 DIAGNOSIS — M719 Bursopathy, unspecified: Secondary | ICD-10-CM | POA: Insufficient documentation

## 2011-03-14 DIAGNOSIS — M1710 Unilateral primary osteoarthritis, unspecified knee: Secondary | ICD-10-CM | POA: Insufficient documentation

## 2011-03-14 DIAGNOSIS — M67919 Unspecified disorder of synovium and tendon, unspecified shoulder: Secondary | ICD-10-CM | POA: Insufficient documentation

## 2011-03-14 MED ORDER — HYDROCODONE-ACETAMINOPHEN 7.5-325 MG PO TABS: 1.0000 | ORAL_TABLET | Freq: Three times a day (TID) | ORAL | Status: DC | PRN

## 2011-03-14 NOTE — Progress Notes (Signed)
Reports not getting much better. Couldn't go to PT. Very difficult ambulating to exam room today. Walks a little in the house. Wishes she could have more convenient place to go for her Rx's. C/O neck arthritis. States keeps meds in safe place. No questions voiced.

## 2011-04-02 ENCOUNTER — Other Ambulatory Visit: Payer: Self-pay | Admitting: Internal Medicine

## 2011-04-08 ENCOUNTER — Other Ambulatory Visit: Payer: Self-pay | Admitting: Physical Medicine & Rehabilitation

## 2011-04-09 ENCOUNTER — Encounter: Payer: Medicare Other | Admitting: Physical Medicine & Rehabilitation

## 2011-04-09 ENCOUNTER — Telehealth: Payer: Self-pay | Admitting: Physical Medicine & Rehabilitation

## 2011-04-09 MED ORDER — HYDROCODONE-ACETAMINOPHEN 7.5-325 MG PO TABS: 1.0000 | ORAL_TABLET | Freq: Three times a day (TID) | ORAL | Status: DC | PRN

## 2011-04-09 NOTE — Telephone Encounter (Signed)
Pt aware that she will have to be seen in order to get rx.

## 2011-04-09 NOTE — Telephone Encounter (Signed)
Patient cannot come in today.  Caught something from granddaughter.  Hard for her to come in anyway, because it is hard for her to get in/out of care with her knee the way it is.  Please call

## 2011-04-09 NOTE — Telephone Encounter (Signed)
Ok

## 2011-04-09 NOTE — Telephone Encounter (Signed)
Rx called in. Pt aware

## 2011-04-09 NOTE — Telephone Encounter (Signed)
Pt is wanting to get an rx this month without having to come in. Please advise.

## 2011-04-09 NOTE — Telephone Encounter (Signed)
Patient would like to p/u prescription this month because it is too painful getting in and out of car.  Please advise.

## 2011-04-10 DIAGNOSIS — Z8719 Personal history of other diseases of the digestive system: Secondary | ICD-10-CM | POA: Insufficient documentation

## 2011-04-10 DIAGNOSIS — D649 Anemia, unspecified: Secondary | ICD-10-CM | POA: Insufficient documentation

## 2011-04-10 DIAGNOSIS — D509 Iron deficiency anemia, unspecified: Secondary | ICD-10-CM | POA: Insufficient documentation

## 2011-04-10 DIAGNOSIS — E66813 Obesity, class 3: Secondary | ICD-10-CM | POA: Insufficient documentation

## 2011-04-22 ENCOUNTER — Other Ambulatory Visit: Payer: Self-pay | Admitting: Family Medicine

## 2011-04-22 DIAGNOSIS — Z780 Asymptomatic menopausal state: Secondary | ICD-10-CM

## 2011-04-22 DIAGNOSIS — Z1231 Encounter for screening mammogram for malignant neoplasm of breast: Secondary | ICD-10-CM

## 2011-04-24 ENCOUNTER — Telehealth: Payer: Self-pay | Admitting: Oncology

## 2011-04-24 NOTE — Telephone Encounter (Signed)
called pt and scheduled appt for 04/16.  aill fax over appt to Dr. Malva Cogan

## 2011-04-25 ENCOUNTER — Telehealth: Payer: Self-pay | Admitting: Hematology and Oncology

## 2011-04-25 NOTE — Telephone Encounter (Signed)
Referred by Dr. Daphine Deutscher Dx- Anemia

## 2011-04-28 ENCOUNTER — Encounter: Payer: Self-pay | Admitting: *Deleted

## 2011-04-28 ENCOUNTER — Other Ambulatory Visit: Payer: Self-pay | Admitting: Oncology

## 2011-04-28 DIAGNOSIS — D649 Anemia, unspecified: Secondary | ICD-10-CM

## 2011-04-29 ENCOUNTER — Telehealth: Payer: Self-pay | Admitting: Oncology

## 2011-04-29 ENCOUNTER — Encounter: Payer: Self-pay | Admitting: Oncology

## 2011-04-29 ENCOUNTER — Ambulatory Visit (HOSPITAL_BASED_OUTPATIENT_CLINIC_OR_DEPARTMENT_OTHER): Payer: Medicare Other | Admitting: Oncology

## 2011-04-29 ENCOUNTER — Ambulatory Visit: Payer: Medicare Other

## 2011-04-29 ENCOUNTER — Ambulatory Visit: Payer: Medicare Other | Admitting: Lab

## 2011-04-29 VITALS — BP 151/81 | HR 66 | Temp 98.6°F | Ht 64.0 in | Wt 299.5 lb

## 2011-04-29 DIAGNOSIS — D649 Anemia, unspecified: Secondary | ICD-10-CM

## 2011-04-29 LAB — CBC WITH DIFFERENTIAL/PLATELET
BASO%: 0.4 % (ref 0.0–2.0)
EOS%: 0.8 % (ref 0.0–7.0)
HCT: 32.5 % — ABNORMAL LOW (ref 34.8–46.6)
LYMPH%: 15.3 % (ref 14.0–49.7)
MCH: 20 pg — ABNORMAL LOW (ref 25.1–34.0)
MCHC: 29.1 g/dL — ABNORMAL LOW (ref 31.5–36.0)
MONO%: 8.5 % (ref 0.0–14.0)
NEUT%: 75 % (ref 38.4–76.8)
Platelets: 203 10*3/uL (ref 145–400)

## 2011-04-29 NOTE — Telephone Encounter (Signed)
appts made and printed for pt aom °

## 2011-04-29 NOTE — Progress Notes (Signed)
Note dictated

## 2011-04-29 NOTE — Progress Notes (Signed)
Patient came in today as a new patient with her daughter,she has two insurance.I did explain to her our financial assistance program and co-pay assistance,she said she get to much money a month, and she think she will be [place].

## 2011-04-30 NOTE — Progress Notes (Signed)
CC:   Cameron Sprang, NP  REASON FOR CONSULTATION:  Anemia.  HISTORY OF PRESENT ILLNESS:  This is a pleasant 66 year old woman native of [place] who currently lives in [place] and has retired from the school system, currently lives by herself although she has a daughter that intermittently lives with her.  She is a pleasant 66 year old woman with a past medical history that is significant for diabetes and arthritis.  She was also diagnosed with what appears to be iron deficiency anemia and about a week ago started on iron replacement.  Her diagnosis of anemia appears to date back to September of 2012 where she received blood transfusion at that time.  Her GI workup including a colonoscopy that was done on October of 2012 was unrevealing.  There were no polyps or cancers or diverticula.  She had also an endoscopy which showed a mild gastritis and esophagitis and was prescribed a PPI at that time.  She has done fairly well.  She does report some occasional fatigue but has not had any bleeding to speak of.  She had not had any hematochezia, had not reported any melena, had not reported any changes in her performance status, had not had any gyn bleeding and had not reported any epistaxis.  Her most recent CBC from what I can see done on 04/14/2011 showed a hemoglobin of 9.6, white cell count of 7.1. Her MCV was 65, RDW was 20, platelet count of 200.  She had iron levels showed an iron level of 20, saturation of 5% and TIBC 421.  She has tolerated the iron fairly well.  She had not had any dyspepsia, had not reported any constipation, overall was doing relatively well.  REVIEW OF SYSTEMS:  Had not reported any headaches, blurry vision, double vision.  Had not reported any motor or sensory neuropathy.  Had not reported any alteration in mental status.  No reported any psychiatric issues, depression.  Not reported any fever, chills, sweats. Has not reported any cough, hemoptysis,  hematemesis.  No nausea or vomiting.  No abdominal pain, hematochezia or melena.  No genitourinary complaints.  The rest of review of systems was unremarkable.  PAST MEDICAL HISTORY:  Significant for diabetes, arthritis, history of iron deficiency anemia, obesity, history of gastritis.  MEDICATIONS:  She is currently on amlodipine, Bentyl, Lasix, Norco, Glucophage, multivitamin, naproxen, Benicar, omega-3, Prilosec, Actos, Metamucil, Crestor and Zoloft.  ALLERGIES:  To codeine.  SOCIAL HISTORY:  She is widowed.  She has 1 biological daughter and 1 adopted.  She denied any alcohol or tobacco abuse.  FAMILY HISTORY:  There is really no history of any blood disorders.  No history of sickle cell or thalassemia.  PHYSICAL EXAMINATION:  General:  Alert, awake female appearing in no active distress today.  Vital signs:  Her blood pressure is 151/81, pulse 66, respirations 20.  She is afebrile.  HEENT:  Head is normocephalic, atraumatic.  Pupils equal, round, reactive to light. Oral mucosa moist and pink.  Neck:  Supple without adenopathy.  Heart: Regular rate and rhythm.  S1, S2.  Lungs:  Clear to auscultation without wheeze or dullness to percussion.  Abdomen:  Soft.  Nontender.  No hepatosplenomegaly.  Extremities:  No clubbing, cyanosis or edema. Neurological:  Intact motor and sensory and deep tendon reflexes.  LABORATORY DATA:  Discussed in the history of present illness.  ASSESSMENT AND PLAN:  A 66 year old female with the following issues:  Microcytosis, microcytic anemia.  Undoubtedly this is related  to iron deficiency anemia although the differential diagnosis was fully detailed today with Ms. Rubye Oaks.  Other etiologies include hemoglobinopathy such as sickle cell anemia or thalassemia as definitely a possibility.  I do not think there are any other causes of anemia such as anemia of chronic disease, myelodysplastic syndrome, hemolytic process are all I think extremely  unlikely.  In terms of working this up I am repeating a CBC as well as iron studies today to confirm that, also hemoglobin electrophoresis to make sure she does not have any evidence of hemoglobinopathy.  In terms of management I discussed today in detail with Ms. Mclaurin that iron supplements orally is a reasonable maneuver to try.  If she does not have any good response in about 6 weeks, I talked to her about the risks and benefits of using IV iron in the form of Feraheme or IV dextran and for the time being she would like to defer that until she has completed oral therapy.  All her questions were answered today.    ______________________________ Benjiman Core, M.D. FNS/MEDQ  D:  04/29/2011  T:  04/29/2011  Job:  [id number]

## 2011-05-01 ENCOUNTER — Other Ambulatory Visit: Payer: Medicare Other

## 2011-05-01 ENCOUNTER — Ambulatory Visit: Payer: Medicare Other

## 2011-05-01 LAB — HEMOGLOBINOPATHY EVALUATION
Hgb A2 Quant: 1.6 % — ABNORMAL LOW (ref 2.2–3.2)
Hgb A: 98.4 % — ABNORMAL HIGH (ref 96.8–97.8)

## 2011-05-01 LAB — IRON AND TIBC
TIBC: 387 ug/dL (ref 250–470)
UIBC: 264 ug/dL (ref 125–400)

## 2011-05-01 LAB — COMPREHENSIVE METABOLIC PANEL
AST: 16 U/L (ref 0–37)
Alkaline Phosphatase: 76 U/L (ref 39–117)
BUN: 11 mg/dL (ref 6–23)
Calcium: 9.1 mg/dL (ref 8.4–10.5)
Creatinine, Ser: 0.71 mg/dL (ref 0.50–1.10)

## 2011-05-01 LAB — FERRITIN: Ferritin: 25 ng/mL (ref 10–291)

## 2011-05-07 ENCOUNTER — Other Ambulatory Visit: Payer: Self-pay | Admitting: Physical Medicine & Rehabilitation

## 2011-05-08 ENCOUNTER — Encounter: Payer: Self-pay | Admitting: *Deleted

## 2011-05-08 ENCOUNTER — Telehealth: Payer: Self-pay | Admitting: *Deleted

## 2011-05-08 ENCOUNTER — Other Ambulatory Visit: Payer: Self-pay | Admitting: Physical Medicine & Rehabilitation

## 2011-05-08 NOTE — Telephone Encounter (Signed)
Message copied by Richardson Chiquito on Thu May 08, 2011  8:12 AM ------      Message from: Hart Carwin      Created: Wed May 07, 2011 11:31 PM       Please keep her appointment. Iron deficiency anemia. Will likely need a SBCE      ----- Message -----         From: Richardson Chiquito, CMA         Sent: 05/07/2011   4:23 PM           To: Hart Carwin, MD            DB-      This patient is on your schedule for 05/19/11 for anemia (she scheduled her own appt). On review of her chart, it appears she is already being worked up for anemia by Dr Clelia Croft.... Is there any reason we need to see her for this in addition to Dr Clelia Croft?

## 2011-05-19 ENCOUNTER — Ambulatory Visit: Payer: Medicare Other | Admitting: Internal Medicine

## 2011-05-19 ENCOUNTER — Telehealth: Payer: Self-pay | Admitting: Internal Medicine

## 2011-05-19 NOTE — Telephone Encounter (Signed)
Please charge no show 

## 2011-05-21 ENCOUNTER — Ambulatory Visit
Admission: RE | Admit: 2011-05-21 | Discharge: 2011-05-21 | Disposition: A | Discharge status: Home / Self Care | Payer: Medicare Other | Source: Ambulatory Visit | Attending: Family Medicine | Admitting: Family Medicine

## 2011-05-21 DIAGNOSIS — Z780 Asymptomatic menopausal state: Secondary | ICD-10-CM

## 2011-05-21 DIAGNOSIS — Z1231 Encounter for screening mammogram for malignant neoplasm of breast: Secondary | ICD-10-CM

## 2011-05-23 ENCOUNTER — Ambulatory Visit: Payer: Medicare Other | Admitting: Internal Medicine

## 2011-05-28 ENCOUNTER — Encounter: Payer: Medicare Other | Attending: Neurosurgery | Admitting: Physical Medicine & Rehabilitation

## 2011-05-28 DIAGNOSIS — M1710 Unilateral primary osteoarthritis, unspecified knee: Secondary | ICD-10-CM | POA: Insufficient documentation

## 2011-05-28 DIAGNOSIS — M67919 Unspecified disorder of synovium and tendon, unspecified shoulder: Secondary | ICD-10-CM | POA: Insufficient documentation

## 2011-05-28 DIAGNOSIS — M25519 Pain in unspecified shoulder: Secondary | ICD-10-CM | POA: Insufficient documentation

## 2011-05-28 DIAGNOSIS — IMO0001 Reserved for inherently not codable concepts without codable children: Secondary | ICD-10-CM | POA: Insufficient documentation

## 2011-05-28 DIAGNOSIS — M542 Cervicalgia: Secondary | ICD-10-CM | POA: Insufficient documentation

## 2011-05-28 DIAGNOSIS — M25569 Pain in unspecified knee: Secondary | ICD-10-CM | POA: Insufficient documentation

## 2011-05-28 DIAGNOSIS — M719 Bursopathy, unspecified: Secondary | ICD-10-CM | POA: Insufficient documentation

## 2011-05-29 ENCOUNTER — Telehealth: Payer: Self-pay | Admitting: *Deleted

## 2011-05-29 NOTE — Telephone Encounter (Signed)
Pt daughter had to change appointment because of her work schedule. Appointment has been moved to 06/10/11 but pt will need a refill on her medication before appointment.

## 2011-05-29 NOTE — Telephone Encounter (Signed)
Pt daughter aware that pt isn't due for a refill until 06/07/11. Walker Kehr to give [place] a call on next Thursday so we can get medication refilled. She agreed.

## 2011-06-06 ENCOUNTER — Encounter: Payer: Self-pay | Admitting: Internal Medicine

## 2011-06-06 ENCOUNTER — Other Ambulatory Visit: Payer: Self-pay | Admitting: Physical Medicine & Rehabilitation

## 2011-06-06 ENCOUNTER — Ambulatory Visit (INDEPENDENT_AMBULATORY_CARE_PROVIDER_SITE_OTHER): Payer: Medicare Other | Admitting: Internal Medicine

## 2011-06-06 ENCOUNTER — Other Ambulatory Visit: Payer: Self-pay | Admitting: *Deleted

## 2011-06-06 VITALS — BP 140/76 | HR 88 | Ht 64.0 in | Wt 285.2 lb

## 2011-06-06 DIAGNOSIS — D509 Iron deficiency anemia, unspecified: Secondary | ICD-10-CM

## 2011-06-06 DIAGNOSIS — D649 Anemia, unspecified: Secondary | ICD-10-CM

## 2011-06-06 NOTE — Patient Instructions (Addendum)
We have scheduled you for a capsule endoscopy.with separate instructions given. CC: Dr Maryelizabeth Rowan, Dr Clelia Croft

## 2011-06-06 NOTE — Progress Notes (Signed)
Jade Ellison [birth year] MRN [id number]        History of Present Illness:  This is a 66 year old white female with a chronic iron deficiency anemia partially responsive to iron supplements. She had a colonoscopy in 2008 and again in October 2012 without finding of specific bleeding lesion. She was at that time having diarrhea which was attributed to metformin. She had a cholecystectomy in the past. Upper endoscopy in October 2012 showed  mild gastritis with metaplasia and also Barrett's esophagus. There was no bleeding from the upper GI tract. On April 16 ,2013 hemoglobin 9.4 ,hematocrit 32.5 with MCV of 69. Her iron saturation was so 32% while taking oral iron. She has been on omeprazole 20 mg a day. Marland Kitchen Upper abdominal ultrasound in September 2012 showed  normal common bile duct at 5.5 mm. Normal-appearing liver, poor visualization of the pancreas due to patient's size   Past Medical History  Diagnosis Date  . Colon polyp     polypoid colorectal mucosa  . Diverticulosis   . Depression   . Nephrolithiasis   . Sleep apnea   . Hypertension   . Obesity   . Diabetes mellitus   . Hemorrhoids   . Hernia of unspecified site of abdominal cavity without mention of obstruction or gangrene   . Arthritis   . Anemia   . Gastritis   . Esophagitis   . Barrett esophagus 10/14/10   Past Surgical History  Procedure Date  . Left sided lithotripsy   . Cholecystectomy   . Knee arthroscopy   . Cardiac catheterization   . Hernia repair   . Tubal ligation   . Cesarean section 1986    reports that she has never smoked. She does not have any smokeless tobacco history on file. She reports that she drinks alcohol. She reports that she does not use illicit drugs. family history includes Diabetes in her brother and sister; Heart disease in her father and mother; and Kidney cancer in her brother. Allergies  Allergen Reactions  . Codeine Rash  . Nsaids     Abdominal Pain        Review of  Systems: Occasional pressure at the side of the ventral hernia. Denies dysphagia odynophagia  The remainder of the 10 point ROS is negative except as outlined in H&P   Physical Exam: General appearance  Well developed, in no distress. Massively overweight Eyes- non icteric. HEENT nontraumatic, normocephalic. Mouth no lesions, tongue papillated, no cheilosis. Neck supple without adenopathy, thyroid not enlarged, no carotid bruits, no JVD. Lungs Clear to auscultation bilaterally. Cor normal S1, normal S2, regular rhythm, no murmur,  quiet precordium. Abdomen: Well-healed surgical scar. Reducible ventral hernia below the umbilical is slightly tender. Normal active bowel sounds Rectal: Unable to do D2 patient's size and immobility Extremities pedal edema.bilat Skin no lesions. Neurological alert and oriented x 3. Psychological normal mood and affect.  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 Partial response to iron supplements resulting in temporary  improvement in her Hgb,which eventually keeps drifting down. Negative upper endoscopy and colonoscopy. It is not clear as to the source of the blood loss. We will proceed with small bowel capsule endoscopy to look for small bowel lesions such as leiomyoma or AV malformations, less likely Crohn's disease. I have explained the procedure to the patient and her daughter she will follow up instructions prior to the capsule endoscopy. She may need iron infusion in the future to restore her iron  stores.   06/06/2011 Jade Ellison

## 2011-06-10 ENCOUNTER — Encounter: Payer: Medicare Other | Admitting: Physical Medicine & Rehabilitation

## 2011-06-10 ENCOUNTER — Ambulatory Visit: Payer: Medicare Other | Admitting: Oncology

## 2011-06-10 ENCOUNTER — Other Ambulatory Visit: Payer: Medicare Other | Admitting: Lab

## 2011-06-12 ENCOUNTER — Ambulatory Visit (INDEPENDENT_AMBULATORY_CARE_PROVIDER_SITE_OTHER): Payer: Medicare Other | Admitting: Internal Medicine

## 2011-06-12 DIAGNOSIS — R195 Other fecal abnormalities: Secondary | ICD-10-CM

## 2011-06-12 DIAGNOSIS — D500 Iron deficiency anemia secondary to blood loss (chronic): Secondary | ICD-10-CM

## 2011-06-12 NOTE — Progress Notes (Signed)
Patient here for capsule endoscopy for Dr. Juanda Chance. Patient and daughter verbalized understanding of all verbal and written information. Patient has been NPO and did the prep for the procedure. Patient swallowed pill without difficulty. Lot-[id number] 25 Expires- 2014-05

## 2011-06-14 ENCOUNTER — Other Ambulatory Visit: Payer: Self-pay | Admitting: Internal Medicine

## 2011-06-16 ENCOUNTER — Encounter: Payer: Self-pay | Admitting: Internal Medicine

## 2011-06-19 ENCOUNTER — Telehealth: Payer: Self-pay | Admitting: *Deleted

## 2011-06-19 ENCOUNTER — Other Ambulatory Visit: Payer: Self-pay | Admitting: *Deleted

## 2011-06-19 NOTE — Telephone Encounter (Signed)
Patient called to reschedule appointments.  Request sent to scheduling to follow-up with patient from POF sent from Dr. Clelia Croft on 04/29/2011.  Patient verbalizes understanding, will contact office if does not hear about re-scheduling appointment.

## 2011-06-23 ENCOUNTER — Telehealth: Payer: Self-pay | Admitting: Internal Medicine

## 2011-06-23 DIAGNOSIS — D649 Anemia, unspecified: Secondary | ICD-10-CM

## 2011-06-23 NOTE — Telephone Encounter (Signed)
Small bowl capsule endoscopy  On 06/12/2011 showed  Duodenal avm"s and gastritis ( EGD did not show any bleeding). Avm's are likely causing low grade GI blood blood. I recommend to have an Iron infusion  To supply Iron IV, in an efficient way. Please set up Iron infusion 1000mg  IV Imfed  Preceded by a test dose of 25 mg IV . Please recheck CBC 2 weeks following Imfed infusion.

## 2011-06-23 NOTE — Telephone Encounter (Signed)
Spoke with patient and she is calling for her capsule endo results. Please, advise.

## 2011-06-24 ENCOUNTER — Other Ambulatory Visit: Payer: Self-pay | Admitting: *Deleted

## 2011-06-24 ENCOUNTER — Telehealth: Payer: Self-pay | Admitting: Internal Medicine

## 2011-06-24 DIAGNOSIS — D509 Iron deficiency anemia, unspecified: Secondary | ICD-10-CM

## 2011-06-24 NOTE — Telephone Encounter (Signed)
Patient calling to report she has not seen the capsule from SBCE in her stool. Should she have KUB?

## 2011-06-24 NOTE — Telephone Encounter (Signed)
Scheduled patient for IV iron at [facility] short stay on 07/01/11 at 10:30 AM. Labs in EPIC. Patient notified and results and recommendations.

## 2011-06-24 NOTE — Telephone Encounter (Signed)
OK to get KUB, "r/o retained capsule"

## 2011-06-24 NOTE — Telephone Encounter (Signed)
Spoke with patient's daughter and she will come for the xray.

## 2011-06-25 ENCOUNTER — Telehealth: Payer: Self-pay | Admitting: Oncology

## 2011-06-25 NOTE — Telephone Encounter (Signed)
Pt called today to r/s 6/18 appt to 6/25.

## 2011-07-01 ENCOUNTER — Ambulatory Visit: Payer: Medicare Other | Admitting: Oncology

## 2011-07-01 ENCOUNTER — Encounter (HOSPITAL_COMMUNITY): Payer: Self-pay

## 2011-07-01 ENCOUNTER — Other Ambulatory Visit: Payer: Medicare Other | Admitting: Lab

## 2011-07-01 ENCOUNTER — Encounter (HOSPITAL_COMMUNITY)
Admission: RE | Admit: 2011-07-01 | Discharge: 2011-07-01 | Disposition: A | Discharge status: Home / Self Care | Payer: Medicare Other | Source: Ambulatory Visit | Attending: Internal Medicine | Admitting: Internal Medicine

## 2011-07-01 DIAGNOSIS — D509 Iron deficiency anemia, unspecified: Secondary | ICD-10-CM | POA: Insufficient documentation

## 2011-07-01 MED ORDER — SODIUM CHLORIDE 0.9 % IV SOLN
1300.0000 mg | Freq: Once | INTRAVENOUS | Status: AC
  Administered 2011-07-01: 1300 mg via INTRAVENOUS
  Filled 2011-07-01: qty 26

## 2011-07-01 MED ORDER — SODIUM CHLORIDE 0.9 % IV SOLN
Freq: Once | INTRAVENOUS | Status: AC
  Administered 2011-07-01: 11:00:00 via INTRAVENOUS

## 2011-07-01 MED ORDER — SODIUM CHLORIDE 0.9 % IV SOLN
25.0000 mg | Freq: Once | INTRAVENOUS | Status: AC
  Administered 2011-07-01: 25 mg via INTRAVENOUS
  Filled 2011-07-01: qty 0.5

## 2011-07-01 MED ORDER — IRON DEXTRAN 50 MG/ML IJ SOLN
1330.0000 mg | Freq: Once | INTRAMUSCULAR | Status: DC
  Filled 2011-07-01: qty 26.6

## 2011-07-01 NOTE — Progress Notes (Signed)
MEDICATION RELATED CONSULT NOTE - INITIAL   Pharmacy Consult for IV Iron Indication: anemia  Allergies  Allergen Reactions  . Codeine Rash  . Nsaids     Abdominal Pain    Patient Measurements: Height: 5\' 3"  (160 cm) Weight: 286 lb (129.729 kg) (Weighed at the doctor's office yesterday.) IBW/kg (Calculated) : 52.4    Vital Signs: Temp: 99 F (37.2 C) (06/18 1045) Temp src: Oral (06/18 1045) BP: 153/89 mmHg (06/18 1045) Pulse Rate: 90  (06/18 1045) Intake/Output from previous day:   Intake/Output from this shift:    Labs: No results found for this basename: WBC:3,HGB:3,HCT:3,PLT:3,APTT:3;INR:3,CREATININE:3,LABCREA:3,CREATININE:3,CREAT24HRUR:3,MG:3,PHOS:3,ALBUMIN:3,PROT:3,ALBUMIN:3,AST:3,ALT:3,ALKPHOS:3,BILITOT:3,BILIDIR:3,IBILI:3 in the last 72 hours Estimated Creatinine Clearance: 91 ml/min (by C-G formula based on Cr of 0.71).   Microbiology: No results found for this or any previous visit (from the past 720 hour(s)).  Medical History: Past Medical History  Diagnosis Date  . Colon polyp     polypoid colorectal mucosa  . Diverticulosis   . Depression   . Nephrolithiasis   . Sleep apnea   . Hypertension   . Obesity   . Diabetes mellitus   . Hemorrhoids   . Hernia of unspecified site of abdominal cavity without mention of obstruction or gangrene   . Arthritis   . Anemia   . Gastritis   . Esophagitis   . Barrett esophagus 10/14/10    Medications:  Scheduled:    . sodium chloride   Intravenous Once  . iron dextran (INFED/DEXFERRUM) infusion  1,330 mg Intravenous Once  . iron dextran (INFED/DEXFERRUM) infusion  25 mg Intravenous Once    Assessment: 66 yof w/ iron deficiency anemia. Pharmacy asked to dose IV Iron hgb 9.4, desired 14.8 [0.0442 x (14.8-9.4) x 52.4] + (0.26x52.4) = 26 ml x 50mg /ml = 1300mg     Plan:  25mg  IV test dose followed by 1300mg   IV iron x 1  Jade Ellison 07/01/2011,10:49 AM

## 2011-07-01 NOTE — Progress Notes (Signed)
Tolerated test dose well

## 2011-07-07 ENCOUNTER — Telehealth: Payer: Self-pay | Admitting: *Deleted

## 2011-07-07 ENCOUNTER — Other Ambulatory Visit: Payer: Self-pay | Admitting: *Deleted

## 2011-07-07 MED ORDER — HYDROCODONE-ACETAMINOPHEN 7.5-325 MG PO TABS: 1.0000 | ORAL_TABLET | Freq: Three times a day (TID) | ORAL | Status: DC | PRN

## 2011-07-07 NOTE — Telephone Encounter (Signed)
Appointment on Friday but will need a refill on pain medication.

## 2011-07-07 NOTE — Telephone Encounter (Signed)
Error

## 2011-07-08 ENCOUNTER — Other Ambulatory Visit (HOSPITAL_BASED_OUTPATIENT_CLINIC_OR_DEPARTMENT_OTHER): Payer: Medicare Other | Admitting: Lab

## 2011-07-08 ENCOUNTER — Telehealth: Payer: Self-pay | Admitting: *Deleted

## 2011-07-08 ENCOUNTER — Encounter: Payer: Self-pay | Admitting: Oncology

## 2011-07-08 ENCOUNTER — Ambulatory Visit (HOSPITAL_BASED_OUTPATIENT_CLINIC_OR_DEPARTMENT_OTHER): Payer: Medicare Other | Admitting: Oncology

## 2011-07-08 ENCOUNTER — Telehealth: Payer: Self-pay | Admitting: Oncology

## 2011-07-08 VITALS — BP 136/77 | HR 80 | Temp 97.1°F | Ht 63.0 in | Wt 284.0 lb

## 2011-07-08 DIAGNOSIS — D509 Iron deficiency anemia, unspecified: Secondary | ICD-10-CM

## 2011-07-08 DIAGNOSIS — D649 Anemia, unspecified: Secondary | ICD-10-CM | POA: Insufficient documentation

## 2011-07-08 LAB — CBC WITH DIFFERENTIAL/PLATELET
BASO%: 0.4 % (ref 0.0–2.0)
Basophils Absolute: 0 10*3/uL (ref 0.0–0.1)
EOS%: 0.8 % (ref 0.0–7.0)
MCH: 24 pg — ABNORMAL LOW (ref 25.1–34.0)
MCHC: 31 g/dL — ABNORMAL LOW (ref 31.5–36.0)
MCV: 77.2 fL — ABNORMAL LOW (ref 79.5–101.0)
MONO%: 7.7 % (ref 0.0–14.0)
RDW: 19.3 % — ABNORMAL HIGH (ref 11.2–14.5)
lymph#: 1.1 10*3/uL (ref 0.9–3.3)

## 2011-07-08 LAB — IRON AND TIBC
TIBC: 334 ug/dL (ref 250–470)
UIBC: 262 ug/dL (ref 125–400)

## 2011-07-08 NOTE — Telephone Encounter (Signed)
Called patient to see if she passed capsule or just has not come for xray. She states she is waiting on her daughter to bring her for xray.

## 2011-07-08 NOTE — Patient Instructions (Addendum)
Your Hemoglobin is normal today - 12.9 Continue iron tablets 3 times a day. Follow-up with [place] in about 3 months for labs to monitor your anemia.  For gas/belching: Beano drops with each meal OR Gas-X (Simethicone) up to 4 times a day as needed.

## 2011-07-08 NOTE — Progress Notes (Signed)
Hematology and Oncology Follow Up Visit  SHAKAYLA HICKOX [id number] Mar 23, 1945 66 y.o. 07/08/2011 3:59 PM DEWEY,ELIZABETH, MDDewey, Lanora Manis, MD   Principle Diagnosis: Iron deficiency anemia  Current therapy: Ferrous sulfate 325 mg PO TID. She was given iron dextran 1300 mg IV on 07/01/11 at her GI office   Interim History:  Ms Issa is a 67 year old female seen for follow-up today. The patient has been seen by GI and had an capsule endoscopy. Reports that she had a dose of IV iron at [facility] last week. Reports fatigue is better. No chest pain, shortness of breath, dyspnea. No abdominal pain, nausea, or vomiting. Reports that she has belching at times. She has not noticed any bleeding recently.  Medications: I have reviewed the patient's current medications. Current outpatient prescriptions:ferrous sulfate 325 (65 FE) MG tablet, Take 325 mg by mouth 3 (three) times daily., Disp: , Rfl: ;  amLODipine (NORVASC) 5 MG tablet, Take 5 mg by mouth daily.  , Disp: , Rfl: ;  aspirin 325 MG tablet, Take 81 mg by mouth daily. , Disp: , Rfl: ;  benazepril (LOTENSIN) 10 MG tablet, Take 10 mg by mouth daily., Disp: , Rfl:  bifidobacterium infantis (ALIGN) capsule, Take 1 capsule by mouth daily., Disp: 1 capsule, Rfl: 0;  dicyclomine (BENTYL) 20 MG tablet, TAKE ONE TABLET BY MOUTH TWICE DAILY, Disp: 60 tablet, Rfl: 5;  furosemide (LASIX) 40 MG tablet, Take 40 mg by mouth as needed.  , Disp: , Rfl: ;  HYDROcodone-acetaminophen (NORCO) 7.5-325 MG per tablet, Take 1 tablet by mouth every 8 (eight) hours as needed for pain., Disp: 75 tablet, Rfl: 0 metFORMIN (GLUCOPHAGE) 1000 MG tablet, Take 1,000 mg by mouth 2 (two) times daily with a meal.  , Disp: , Rfl: ;  Multiple Vitamins-Minerals (MULTIVITAL PO), Take 1 capsule by mouth daily.  , Disp: , Rfl: ;  olmesartan (BENICAR) 40 MG tablet, Take 40 mg by mouth daily.  , Disp: , Rfl: ;  OMEGA 3 1000 MG CAPS, Take 1 capsule by mouth daily.  , Disp: , Rfl:  omeprazole  (PRILOSEC) 20 MG capsule, TAKE ONE CAPSULE BY MOUTH EVERY DAY, Disp: 30 capsule, Rfl: 3;  pioglitazone (ACTOS) 45 MG tablet, Take 45 mg by mouth daily., Disp: , Rfl: ;  Psyllium (METAMUCIL) 28.3 % POWD, Takes 2 tsp in 8 ounces of water once a day , Disp: , Rfl: ;  rosuvastatin (CRESTOR) 20 MG tablet, Take 20 mg by mouth daily.  , Disp: , Rfl: ;  sertraline (ZOLOFT) 50 MG tablet, Take 50 mg by mouth daily.  , Disp: , Rfl:  Travoprost, BAK Free, (TRAVATAMN) 0.004 % SOLN ophthalmic solution, Apply 1 drop to eye at bedtime.  , Disp: , Rfl:  Current facility-administered medications:0.9 %  sodium chloride infusion, 500 mL, Intravenous, Continuous, Hart Carwin, MD  Allergies:  Allergies  Allergen Reactions  . Codeine Rash  . Nsaids     Abdominal Pain    Past Medical History, Surgical history, Social history, and Family History were reviewed and updated.  Review of Systems: Constitutional:  Negative for fever, chills, night sweats, anorexia, weight loss, pain. Cardiovascular: no chest pain or dyspnea on exertion Respiratory: no cough, shortness of breath, or wheezing Neurological: no TIA or stroke symptoms Dermatological: negative ENT: negative Skin: Negative. Gastrointestinal: no abdominal pain, change in bowel habits, or black or bloody stools Genito-Urinary: no dysuria, trouble voiding, or hematuria Hematological and Lymphatic: negative Breast: negative for breast lumps Musculoskeletal:  negative Remaining ROS negative.  Physical Exam: Blood pressure 136/77, pulse 80, temperature 97.1 F (36.2 C), temperature source Oral, height 5\' 3"  (1.6 m), weight 284 lb (128.822 kg). ECOG: 1-2 General appearance: alert, cooperative and no distress Head: Normocephalic, without obvious abnormality, atraumatic Neck: no adenopathy, no carotid bruit, no JVD, supple, symmetrical, trachea midline and thyroid not enlarged, symmetric, no tenderness/mass/nodules Lymph nodes: Cervical, supraclavicular, and  axillary nodes normal. Heart:regular rate and rhythm, S1, S2 normal, no murmur, click, rub or gallop Lung:chest clear, no wheezing, rales, normal symmetric air entry, no tachypnea, retractions or cyanosis Abdomen: soft, non-tender, without masses or organomegaly EXT:no erythema, induration, or nodules  Lab Results: Lab Results  Component Value Date   WBC 6.7 07/08/2011   HGB 12.9 07/08/2011   HCT 41.7 07/08/2011   MCV 77.2* 07/08/2011   PLT 178 07/08/2011     Chemistry      Component Value Date/Time   NA 140 04/29/2011 1434   K 3.8 04/29/2011 1434   CL 110 04/29/2011 1434   CO2 26 04/29/2011 1434   BUN 11 04/29/2011 1434   CREATININE 0.71 04/29/2011 1434      Component Value Date/Time   CALCIUM 9.1 04/29/2011 1434   ALKPHOS 76 04/29/2011 1434   AST 16 04/29/2011 1434   ALT 9 04/29/2011 1434   BILITOT 0.6 04/29/2011 1434     Impression and Plan: This is a 66 year old female with the following issues: 1. Iron deficiency anemia. GI work-up in progress due to heme positive stools. Hemoglobin has now corrected with oral iron and IV iron. Recommend that she continue oral iron. Will continue to monitor her labs. She may need additional IV iron in the future. 2. DM. Continue Metformin and Actos per PCP. 3. Hyperlipidemia. Continue Crestor per PCP. 4. HTN. Continue Norvasc, Lotensin, Lasix, and Benicar per PCP. 5. Follow-up. In about 3 months to monitor labs.  Spent more than half the time coordinating care.    Clenton Pare 6/25/20133:59 PM

## 2011-07-08 NOTE — Telephone Encounter (Signed)
lmonvm for pt re appt for 9/25. Schedule mailed today.

## 2011-07-08 NOTE — Telephone Encounter (Signed)
Message copied by Daphine Deutscher on Tue Jul 08, 2011  8:52 AM ------      Message from: Daphine Deutscher      Created: Tue Jun 24, 2011 12:59 PM       Did patient have KUB for DB

## 2011-07-09 ENCOUNTER — Ambulatory Visit (INDEPENDENT_AMBULATORY_CARE_PROVIDER_SITE_OTHER)
Admission: RE | Admit: 2011-07-09 | Discharge: 2011-07-09 | Disposition: A | Discharge status: Home / Self Care | Payer: Medicare Other | Source: Ambulatory Visit | Attending: Internal Medicine | Admitting: Internal Medicine

## 2011-07-09 DIAGNOSIS — D509 Iron deficiency anemia, unspecified: Secondary | ICD-10-CM

## 2011-07-11 ENCOUNTER — Telehealth: Payer: Self-pay | Admitting: *Deleted

## 2011-07-11 ENCOUNTER — Encounter: Payer: Medicare Other | Admitting: Physical Medicine & Rehabilitation

## 2011-07-11 NOTE — Telephone Encounter (Signed)
Please call pt with normal Hgb up to 12.0 was 9.0 in April. ----- Message ----- From: Daphine Deutscher, RN Sent: 07/11/2011 1:43 PM To: Hart Carwin, MD Patient notified.

## 2011-07-29 DIAGNOSIS — M8580 Other specified disorders of bone density and structure, unspecified site: Secondary | ICD-10-CM | POA: Insufficient documentation

## 2011-08-01 ENCOUNTER — Telehealth: Payer: Self-pay | Admitting: Physical Medicine & Rehabilitation

## 2011-08-01 NOTE — Telephone Encounter (Signed)
Can take Mobic if she can tolerate NSAIDS, can refill oxy on 22, she should put her meds in a safe place, it could be deadly for a 66 year old !!!

## 2011-08-01 NOTE — Telephone Encounter (Signed)
Pt was given Norco on 07/07/11 #75. Please advise.

## 2011-08-01 NOTE — Telephone Encounter (Signed)
Pt aware. She is going to call us on Monday so she can get a refill of her Hydrocodone.

## 2011-08-01 NOTE — Telephone Encounter (Signed)
Patient receiving HH PT.  Having hard time getting mobile.  Has lost pain pills at house.  66yr old put them somewhere.  Needs something for pain.

## 2011-08-02 ENCOUNTER — Other Ambulatory Visit: Payer: Self-pay | Admitting: Physical Medicine & Rehabilitation

## 2011-08-08 ENCOUNTER — Telehealth: Payer: Self-pay

## 2011-08-08 NOTE — Telephone Encounter (Signed)
The wrong dose of Hydrocodone was called in for her. I advised her to take the med like she is used and call us when she is almost out and we will call in the right dose for her.

## 2011-08-08 NOTE — Telephone Encounter (Signed)
Pt called regarding incorrect sig on her last script normally she gets #75 but this time she only got #60.  Please advise.

## 2011-08-12 ENCOUNTER — Ambulatory Visit: Payer: Medicare Other | Admitting: Physical Medicine & Rehabilitation

## 2011-08-22 ENCOUNTER — Emergency Department (HOSPITAL_COMMUNITY)
Admission: EM | Admit: 2011-08-22 | Discharge: 2011-08-22 | Disposition: A | Discharge status: Home / Self Care | Payer: Medicare Other | Attending: Emergency Medicine | Admitting: Emergency Medicine

## 2011-08-22 ENCOUNTER — Encounter (HOSPITAL_COMMUNITY): Payer: Self-pay | Admitting: Emergency Medicine

## 2011-08-22 DIAGNOSIS — Z886 Allergy status to analgesic agent status: Secondary | ICD-10-CM | POA: Insufficient documentation

## 2011-08-22 DIAGNOSIS — I10 Essential (primary) hypertension: Secondary | ICD-10-CM | POA: Insufficient documentation

## 2011-08-22 DIAGNOSIS — Z7982 Long term (current) use of aspirin: Secondary | ICD-10-CM | POA: Insufficient documentation

## 2011-08-22 DIAGNOSIS — Z885 Allergy status to narcotic agent status: Secondary | ICD-10-CM | POA: Insufficient documentation

## 2011-08-22 DIAGNOSIS — Z79899 Other long term (current) drug therapy: Secondary | ICD-10-CM | POA: Insufficient documentation

## 2011-08-22 DIAGNOSIS — R202 Paresthesia of skin: Secondary | ICD-10-CM

## 2011-08-22 DIAGNOSIS — E119 Type 2 diabetes mellitus without complications: Secondary | ICD-10-CM | POA: Insufficient documentation

## 2011-08-22 DIAGNOSIS — R209 Unspecified disturbances of skin sensation: Secondary | ICD-10-CM | POA: Insufficient documentation

## 2011-08-22 LAB — BASIC METABOLIC PANEL
BUN: 12 mg/dL (ref 6–23)
Calcium: 9.8 mg/dL (ref 8.4–10.5)
Creatinine, Ser: 0.78 mg/dL (ref 0.50–1.10)
GFR calc Af Amer: >90 mL/min (ref >90)
GFR calc non Af Amer: 85 mL/min — ABNORMAL LOW (ref >90)
Glucose, Bld: 99 mg/dL (ref 70–99)

## 2011-08-22 LAB — CBC WITH DIFFERENTIAL/PLATELET
Basophils Relative: 0 % (ref 0–1)
Eosinophils Absolute: 0.1 10*3/uL (ref 0.0–0.7)
Eosinophils Relative: 1 % (ref 0–5)
Hemoglobin: 12.7 g/dL (ref 12.0–15.0)
Lymphs Abs: 1.4 10*3/uL (ref 0.7–4.0)
MCH: 25.3 pg — ABNORMAL LOW (ref 26.0–34.0)
MCHC: 31.8 g/dL (ref 30.0–36.0)
MCV: 79.6 fL (ref 78.0–100.0)
Monocytes Relative: 8 % (ref 3–12)
Neutrophils Relative %: 70 % (ref 43–77)
RBC: 5.01 MIL/uL (ref 3.87–5.11)

## 2011-08-22 NOTE — ED Notes (Signed)
Pt states that she has not had her BP meds in past 2 days.

## 2011-08-22 NOTE — ED Notes (Signed)
Pt from home.  Pt was eating peppers, mushrooms and onions when she began to have L hand numbness that spread to her elbow and then to her face.  Pt also began to have chest pressure and sob.  EMS called.  Stroke scale negative by EMS.  CBG 79 by ems.  Numbness subsided by the time she got in the ambulance and chest pressure and sob subsided by the time she got to the ED.  Lungs equal and clear, stroke scale negative, and pt denies chest pain at this time.

## 2011-08-22 NOTE — ED Provider Notes (Signed)
History     CSN: [id number]  Arrival date & time 08/22/11  1413   First MD Initiated Contact with Patient 08/22/11 1502      Chief Complaint  Patient presents with  . Numbness  . Shortness of Breath    (Consider location/radiation/quality/duration/timing/severity/associated sxs/prior treatment) Patient is a 66 y.o. female presenting with shortness of breath. The history is provided by the patient and a relative.  Shortness of Breath  Associated symptoms include shortness of breath. Pertinent negatives include no fever and no cough.   66 year old, female, with history of diabetes, hypertension, morbid obesity, presents to emergency department complaining of numbness in her left arm, and face, along with mild chest tightness and lightheadedness.  Her symptoms began after she was eating peppers mushrooms, and onions.  Her symptoms lasted for probably 15 minutes and then resolved.  She denied pain anywhere.  She denied nausea, vomiting.  She denied weakness, or palpitations.  She denies recent illness.  She is completely asymptomatic now.  Past Medical History  Diagnosis Date  . Colon polyp     polypoid colorectal mucosa  . Diverticulosis   . Depression   . Nephrolithiasis   . Sleep apnea   . Hypertension   . Obesity   . Diabetes mellitus   . Hemorrhoids   . Hernia of unspecified site of abdominal cavity without mention of obstruction or gangrene   . Arthritis   . Anemia   . Gastritis   . Esophagitis   . Barrett esophagus 10/14/10    Past Surgical History  Procedure Date  . Left sided lithotripsy   . Cholecystectomy   . Knee arthroscopy   . Cardiac catheterization   . Hernia repair   . Tubal ligation   . Cesarean section 1986    Family History  Problem Relation Age of Onset  . Heart disease Mother   . Heart disease Father   . Diabetes Brother   . Diabetes Sister   . Kidney cancer Brother     History  Substance Use Topics  . Smoking status: Never Smoker   .  Smokeless tobacco: Not on file  . Alcohol Use: 0.6 oz/week    1 Glasses of wine per week     wine sometimes on holidays and bedtime    OB History    Grav Para Term Preterm Abortions TAB SAB Ect Mult Living                  Review of Systems  Constitutional: Negative for fever and diaphoresis.  HENT: Negative for neck pain.   Eyes: Negative for visual disturbance.  Respiratory: Positive for chest tightness and shortness of breath. Negative for cough.   Cardiovascular: Negative for palpitations and leg swelling.  Gastrointestinal: Negative for nausea, vomiting and abdominal pain.  Genitourinary: Negative for dysuria.  Musculoskeletal: Negative for back pain.  Skin: Negative for rash.  Neurological: Positive for dizziness and light-headedness. Negative for headaches.  Hematological: Does not bruise/bleed easily.  Psychiatric/Behavioral: Negative for confusion.  All other systems reviewed and are negative.    Allergies  Codeine and Nsaids  Home Medications   Current Outpatient Rx  Name Route Sig Dispense Refill  . AMLODIPINE BESYLATE 5 MG PO TABS Oral Take 5 mg by mouth daily.      . ASPIRIN 325 MG PO TABS Oral Take 81 mg by mouth daily.     Marland Kitchen BENAZEPRIL HCL 10 MG PO TABS Oral Take 10 mg by mouth daily.    Marland Kitchen  CIPROFLOXACIN HCL 500 MG PO TABS Oral Take 500 mg by mouth 2 (two) times daily.    Marland Kitchen DICYCLOMINE HCL 20 MG PO TABS Oral Take 20 mg by mouth every 6 (six) hours.    Di Kindle SULFATE 325 (65 FE) MG PO TABS Oral Take 325 mg by mouth 3 (three) times daily.    Marland Kitchen METFORMIN HCL 1000 MG PO TABS Oral Take 1,000 mg by mouth 2 (two) times daily with a meal.      . MIRABEGRON ER 50 MG PO TB24 Oral Take 50 mg by mouth daily.    . MULTIVITAL PO Oral Take 1 capsule by mouth daily.      Marland Kitchen OLMESARTAN MEDOXOMIL 40 MG PO TABS Oral Take 40 mg by mouth daily.      . OMEGA 3 1000 MG PO CAPS Oral Take 1 capsule by mouth daily.      Marland Kitchen OMEPRAZOLE 20 MG PO CPDR Oral Take 20 mg by mouth daily.     Marland Kitchen ALIGN 4 MG PO CAPS Oral Take 1 capsule by mouth daily.    . PSYLLIUM 28.3 % PO POWD  Takes 2 tsp in 8 ounces of water once a day     . ROSUVASTATIN CALCIUM 20 MG PO TABS Oral Take 20 mg by mouth daily.      . TRAVOPROST (BAK FREE) 0.004 % OP SOLN Ophthalmic Apply 1 drop to eye at bedtime.        BP 174/79  Pulse 87  Temp 97.6 F (36.4 C) (Oral)  Resp 20  SpO2 100%  Physical Exam  Nursing note and vitals reviewed. Constitutional: She is oriented to person, place, and time. No distress.       Morbidly obese  HENT:  Head: Normocephalic and atraumatic.  Eyes: Conjunctivae are normal.  Neck: Normal range of motion. Neck supple. No JVD present.       No bruit  Cardiovascular: Normal rate.   No murmur heard. Pulmonary/Chest: Effort normal and breath sounds normal. She has no rales.  Abdominal: Soft. Bowel sounds are normal.  Musculoskeletal: Normal range of motion. She exhibits no edema.  Neurological: She is alert and oriented to person, place, and time. She has normal strength. No cranial nerve deficit.  Skin: Skin is warm and dry. No rash noted.  Psychiatric: She has a normal mood and affect. Thought content normal.    ED Course  Procedures (including critical care time) 66 year old, female, had left-sided paresthesias, with mild chest tightness and shortness of breath.  That lasted approximately 15 minutes following eating food.  Today.  She is completely asymptomatic now.  Her neuro neurological examination, and physical examination are unremarkable except for her morbid obesity.  We'll perform laboratory testing, and an EKG, for evaluation.  There is no indication, that she's had a stroke or acute coronary syndrome.  Labs Reviewed - No data to display No results found.   No diagnosis found.  ECG Normal sinus rhythm at 90 beats per minute. Normal axis. Normal intervals. Normal.  ST and T waves. Impression normal EKG   MDM  Left-sided numbness.  No evidence of  stroke, or ACS.  Symptoms resolved, normal.  Physical except for morbid obesity        Cheri Guppy, MD 08/22/11 [phone]

## 2011-08-29 ENCOUNTER — Other Ambulatory Visit: Payer: Self-pay

## 2011-08-29 MED ORDER — HYDROCODONE-ACETAMINOPHEN 7.5-325 MG PO TABS: 1.0000 | ORAL_TABLET | Freq: Three times a day (TID) | ORAL | Status: DC | PRN

## 2011-09-19 ENCOUNTER — Emergency Department (HOSPITAL_COMMUNITY): Payer: Medicare Other

## 2011-09-19 ENCOUNTER — Encounter (HOSPITAL_COMMUNITY): Payer: Self-pay | Admitting: Emergency Medicine

## 2011-09-19 ENCOUNTER — Inpatient Hospital Stay (HOSPITAL_COMMUNITY): Payer: Medicare Other

## 2011-09-19 ENCOUNTER — Observation Stay (HOSPITAL_COMMUNITY)
Admission: EM | Admit: 2011-09-19 | Discharge: 2011-09-20 | Disposition: A | Discharge status: Home / Self Care | Payer: Medicare Other | Attending: Internal Medicine | Admitting: Internal Medicine

## 2011-09-19 DIAGNOSIS — G4733 Obstructive sleep apnea (adult) (pediatric): Secondary | ICD-10-CM | POA: Diagnosis present

## 2011-09-19 DIAGNOSIS — D649 Anemia, unspecified: Secondary | ICD-10-CM | POA: Diagnosis present

## 2011-09-19 DIAGNOSIS — IMO0001 Reserved for inherently not codable concepts without codable children: Secondary | ICD-10-CM | POA: Diagnosis present

## 2011-09-19 DIAGNOSIS — Z860100 Personal history of colon polyps, unspecified: Secondary | ICD-10-CM

## 2011-09-19 DIAGNOSIS — E785 Hyperlipidemia, unspecified: Secondary | ICD-10-CM | POA: Insufficient documentation

## 2011-09-19 DIAGNOSIS — E119 Type 2 diabetes mellitus without complications: Secondary | ICD-10-CM

## 2011-09-19 DIAGNOSIS — R130 Aphagia: Secondary | ICD-10-CM | POA: Diagnosis present

## 2011-09-19 DIAGNOSIS — R4789 Other speech disturbances: Secondary | ICD-10-CM | POA: Insufficient documentation

## 2011-09-19 DIAGNOSIS — R1310 Dysphagia, unspecified: Secondary | ICD-10-CM | POA: Insufficient documentation

## 2011-09-19 DIAGNOSIS — G43109 Migraine with aura, not intractable, without status migrainosus: Principal | ICD-10-CM

## 2011-09-19 DIAGNOSIS — F8081 Childhood onset fluency disorder: Secondary | ICD-10-CM

## 2011-09-19 DIAGNOSIS — G4730 Sleep apnea, unspecified: Secondary | ICD-10-CM

## 2011-09-19 DIAGNOSIS — F329 Major depressive disorder, single episode, unspecified: Secondary | ICD-10-CM

## 2011-09-19 DIAGNOSIS — I471 Supraventricular tachycardia, unspecified: Secondary | ICD-10-CM

## 2011-09-19 DIAGNOSIS — D500 Iron deficiency anemia secondary to blood loss (chronic): Secondary | ICD-10-CM

## 2011-09-19 DIAGNOSIS — F985 Adult onset fluency disorder: Secondary | ICD-10-CM | POA: Insufficient documentation

## 2011-09-19 DIAGNOSIS — I10 Essential (primary) hypertension: Secondary | ICD-10-CM

## 2011-09-19 DIAGNOSIS — I498 Other specified cardiac arrhythmias: Secondary | ICD-10-CM | POA: Insufficient documentation

## 2011-09-19 DIAGNOSIS — F3289 Other specified depressive episodes: Secondary | ICD-10-CM

## 2011-09-19 LAB — DIFFERENTIAL
Basophils Relative: 0 % (ref 0–1)
Eosinophils Absolute: 0.1 10*3/uL (ref 0.0–0.7)
Eosinophils Relative: 2 % (ref 0–5)
Monocytes Absolute: 0.4 10*3/uL (ref 0.1–1.0)
Monocytes Relative: 8 % (ref 3–12)
Neutro Abs: 3.2 10*3/uL (ref 1.7–7.7)

## 2011-09-19 LAB — COMPREHENSIVE METABOLIC PANEL
Albumin: 3.8 g/dL (ref 3.5–5.2)
BUN: 16 mg/dL (ref 6–23)
Calcium: 10.1 mg/dL (ref 8.4–10.5)
Chloride: 101 mEq/L (ref 96–112)
Creatinine, Ser: 0.84 mg/dL (ref 0.50–1.10)
Total Bilirubin: 0.8 mg/dL (ref 0.3–1.2)

## 2011-09-19 LAB — CK TOTAL AND CKMB (NOT AT ARMC)
CK, MB: 2.9 ng/mL (ref 0.3–4.0)
Relative Index: 1.7 (ref 0.0–2.5)
Total CK: 167 U/L (ref 7–177)

## 2011-09-19 LAB — URINALYSIS, ROUTINE W REFLEX MICROSCOPIC
Bilirubin Urine: NEGATIVE
Nitrite: POSITIVE — AB
Specific Gravity, Urine: 1.014 (ref 1.005–1.030)
pH: 7.5 (ref 5.0–8.0)

## 2011-09-19 LAB — GLUCOSE, CAPILLARY: Glucose-Capillary: 183 mg/dL — ABNORMAL HIGH (ref 70–99)

## 2011-09-19 LAB — CBC
HCT: 40.1 % (ref 36.0–46.0)
Hemoglobin: 12.9 g/dL (ref 12.0–15.0)
MCH: 26.1 pg (ref 26.0–34.0)
MCH: 26.1 pg (ref 26.0–34.0)
MCHC: 32.2 g/dL (ref 30.0–36.0)
MCV: 81.2 fL (ref 78.0–100.0)
Platelets: 194 10*3/uL (ref 150–400)
RBC: 4.86 MIL/uL (ref 3.87–5.11)
RDW: 16 % — ABNORMAL HIGH (ref 11.5–15.5)

## 2011-09-19 LAB — POCT I-STAT, CHEM 8
Creatinine, Ser: 0.9 mg/dL (ref 0.50–1.10)
Glucose, Bld: 164 mg/dL — ABNORMAL HIGH (ref 70–99)
Hemoglobin: 13.9 g/dL (ref 12.0–15.0)
Sodium: 140 mEq/L (ref 135–145)
TCO2: 26 mmol/L (ref 0–100)

## 2011-09-19 LAB — URINE MICROSCOPIC-ADD ON

## 2011-09-19 LAB — TROPONIN I: Troponin I: <0.3 ng/mL (ref <0.30)

## 2011-09-19 LAB — CREATININE, SERUM: Creatinine, Ser: 0.66 mg/dL (ref 0.50–1.10)

## 2011-09-19 MED ORDER — ACETAMINOPHEN 650 MG RE SUPP: 650.0000 mg | Freq: Four times a day (QID) | RECTAL | Status: DC | PRN

## 2011-09-19 MED ORDER — ALIGN 4 MG PO CAPS: 1.0000 | ORAL_CAPSULE | Freq: Every day | ORAL | Status: DC

## 2011-09-19 MED ORDER — SODIUM CHLORIDE 0.9 % IJ SOLN
3.0000 mL | Freq: Two times a day (BID) | INTRAMUSCULAR | Status: DC
  Administered 2011-09-19: 21:00:00 via INTRAVENOUS

## 2011-09-19 MED ORDER — INSULIN ASPART 100 UNIT/ML ~~LOC~~ SOLN: 0.0000 [IU] | Freq: Every day | SUBCUTANEOUS | Status: DC

## 2011-09-19 MED ORDER — ALUM & MAG HYDROXIDE-SIMETH 200-200-20 MG/5ML PO SUSP: 30.0000 mL | Freq: Four times a day (QID) | ORAL | Status: DC | PRN

## 2011-09-19 MED ORDER — LEVALBUTEROL HCL 0.63 MG/3ML IN NEBU
0.6300 mg | INHALATION_SOLUTION | Freq: Four times a day (QID) | RESPIRATORY_TRACT | Status: DC | PRN
  Filled 2011-09-19: qty 3

## 2011-09-19 MED ORDER — OMEGA 3 1000 MG PO CAPS: 1.0000 | ORAL_CAPSULE | Freq: Every day | ORAL | Status: DC

## 2011-09-19 MED ORDER — ONDANSETRON HCL 4 MG PO TABS: 4.0000 mg | ORAL_TABLET | Freq: Four times a day (QID) | ORAL | Status: DC | PRN

## 2011-09-19 MED ORDER — ONDANSETRON HCL 4 MG/2ML IJ SOLN: 4.0000 mg | Freq: Four times a day (QID) | INTRAMUSCULAR | Status: DC | PRN

## 2011-09-19 MED ORDER — ALPRAZOLAM 0.5 MG PO TABS
1.0000 mg | ORAL_TABLET | Freq: Every day | ORAL | Status: DC
  Filled 2011-09-19: qty 2

## 2011-09-19 MED ORDER — ACETAMINOPHEN 325 MG PO TABS: 650.0000 mg | ORAL_TABLET | Freq: Four times a day (QID) | ORAL | Status: DC | PRN

## 2011-09-19 MED ORDER — METOPROLOL TARTRATE 1 MG/ML IV SOLN
INTRAVENOUS | Status: AC
  Administered 2011-09-19: 5 mg via INTRAVENOUS
  Filled 2011-09-19: qty 5

## 2011-09-19 MED ORDER — METOPROLOL TARTRATE 1 MG/ML IV SOLN
5.0000 mg | INTRAVENOUS | Status: AC
  Administered 2011-09-19: 5 mg via INTRAVENOUS

## 2011-09-19 MED ORDER — HYDROCODONE-ACETAMINOPHEN 7.5-325 MG PO TABS
1.0000 | ORAL_TABLET | Freq: Three times a day (TID) | ORAL | Status: DC | PRN
  Administered 2011-09-20: 1 via ORAL
  Filled 2011-09-19: qty 1

## 2011-09-19 MED ORDER — SODIUM CHLORIDE 0.9 % IV SOLN: 500.0000 mL | INTRAVENOUS | Status: DC

## 2011-09-19 MED ORDER — ENOXAPARIN SODIUM 40 MG/0.4ML ~~LOC~~ SOLN
40.0000 mg | SUBCUTANEOUS | Status: DC
  Administered 2011-09-19: 40 mg via SUBCUTANEOUS
  Filled 2011-09-19 (×2): qty 0.4

## 2011-09-19 MED ORDER — TRAVOPROST (BAK FREE) 0.004 % OP SOLN
1.0000 [drp] | Freq: Every day | OPHTHALMIC | Status: DC
  Administered 2011-09-19: 1 [drp] via OPHTHALMIC
  Filled 2011-09-19: qty 2.5

## 2011-09-19 MED ORDER — ASPIRIN EC 81 MG PO TBEC
162.0000 mg | DELAYED_RELEASE_TABLET | Freq: Every day | ORAL | Status: DC
  Administered 2011-09-20: 162 mg via ORAL
  Filled 2011-09-19: qty 2

## 2011-09-19 MED ORDER — MORPHINE SULFATE 4 MG/ML IJ SOLN
4.0000 mg | Freq: Once | INTRAMUSCULAR | Status: AC
  Administered 2011-09-19: 4 mg via INTRAVENOUS
  Filled 2011-09-19: qty 1

## 2011-09-19 MED ORDER — PSYLLIUM 95 % PO PACK
1.0000 | PACK | Freq: Every day | ORAL | Status: DC
  Administered 2011-09-20: 1 via ORAL
  Filled 2011-09-19 (×2): qty 1

## 2011-09-19 MED ORDER — ALPRAZOLAM 0.25 MG PO TABS: 0.2500 mg | ORAL_TABLET | Freq: Two times a day (BID) | ORAL | Status: DC | PRN

## 2011-09-19 MED ORDER — DILTIAZEM HCL 100 MG IV SOLR
5.0000 mg/h | INTRAVENOUS | Status: DC
  Administered 2011-09-19: 5 mg/h via INTRAVENOUS
  Filled 2011-09-19 (×2): qty 100

## 2011-09-19 MED ORDER — PANTOPRAZOLE SODIUM 40 MG PO TBEC
40.0000 mg | DELAYED_RELEASE_TABLET | Freq: Every day | ORAL | Status: DC
  Administered 2011-09-20: 40 mg via ORAL
  Filled 2011-09-19: qty 1

## 2011-09-19 MED ORDER — SODIUM CHLORIDE 0.9 % IV SOLN
INTRAVENOUS | Status: DC
  Administered 2011-09-19: 21:00:00 via INTRAVENOUS

## 2011-09-19 MED ORDER — LOSARTAN POTASSIUM 50 MG PO TABS
50.0000 mg | ORAL_TABLET | Freq: Every day | ORAL | Status: DC
  Administered 2011-09-20: 50 mg via ORAL
  Filled 2011-09-19: qty 1

## 2011-09-19 MED ORDER — FERROUS SULFATE 325 (65 FE) MG PO TABS
325.0000 mg | ORAL_TABLET | Freq: Three times a day (TID) | ORAL | Status: DC
  Administered 2011-09-19 – 2011-09-20 (×3): 325 mg via ORAL
  Filled 2011-09-19 (×4): qty 1

## 2011-09-19 MED ORDER — ATORVASTATIN CALCIUM 40 MG PO TABS
40.0000 mg | ORAL_TABLET | Freq: Every day | ORAL | Status: DC
  Filled 2011-09-19: qty 1

## 2011-09-19 MED ORDER — INSULIN ASPART 100 UNIT/ML ~~LOC~~ SOLN
0.0000 [IU] | Freq: Three times a day (TID) | SUBCUTANEOUS | Status: DC
  Administered 2011-09-20: 1 [IU] via SUBCUTANEOUS

## 2011-09-19 MED ORDER — BACID PO TABS
2.0000 | ORAL_TABLET | Freq: Every day | ORAL | Status: DC
  Administered 2011-09-19 – 2011-09-20 (×2): 2 via ORAL
  Filled 2011-09-19 (×2): qty 2

## 2011-09-19 MED ORDER — DILTIAZEM LOAD VIA INFUSION
10.0000 mg | Freq: Once | INTRAVENOUS | Status: AC
  Administered 2011-09-19: 10 mg via INTRAVENOUS
  Filled 2011-09-19: qty 10

## 2011-09-19 MED ORDER — DICYCLOMINE HCL 20 MG PO TABS
20.0000 mg | ORAL_TABLET | Freq: Four times a day (QID) | ORAL | Status: DC
  Administered 2011-09-19 – 2011-09-20 (×3): 20 mg via ORAL
  Filled 2011-09-19 (×9): qty 1

## 2011-09-19 MED ORDER — SODIUM CHLORIDE 0.9 % IV SOLN: INTRAVENOUS | Status: DC

## 2011-09-19 MED ORDER — METOPROLOL TARTRATE 25 MG PO TABS
25.0000 mg | ORAL_TABLET | Freq: Two times a day (BID) | ORAL | Status: DC
  Administered 2011-09-19 – 2011-09-20 (×2): 25 mg via ORAL
  Filled 2011-09-19 (×4): qty 1

## 2011-09-19 MED ORDER — PROCHLORPERAZINE EDISYLATE 5 MG/ML IJ SOLN
10.0000 mg | Freq: Once | INTRAMUSCULAR | Status: AC
  Administered 2011-09-19: 10 mg via INTRAVENOUS
  Filled 2011-09-19 (×2): qty 2

## 2011-09-19 MED ORDER — OMEGA-3-ACID ETHYL ESTERS 1 G PO CAPS
1.0000 g | ORAL_CAPSULE | Freq: Every day | ORAL | Status: DC
  Administered 2011-09-20: 1 g via ORAL
  Filled 2011-09-19: qty 1

## 2011-09-19 MED ORDER — LORAZEPAM 2 MG/ML IJ SOLN
0.5000 mg | INTRAMUSCULAR | Status: DC | PRN
  Administered 2011-09-19: 0.5 mg via INTRAVENOUS
  Filled 2011-09-19: qty 1

## 2011-09-19 MED ORDER — DIPHENHYDRAMINE HCL 50 MG/ML IJ SOLN
25.0000 mg | Freq: Once | INTRAMUSCULAR | Status: AC
  Administered 2011-09-19: 25 mg via INTRAVENOUS
  Filled 2011-09-19: qty 0.5

## 2011-09-19 NOTE — ED Notes (Signed)
Patient from the time of arrival to currently patient has runs of SVT lasting 4 seconds with heart increases to 160's.

## 2011-09-19 NOTE — ED Provider Notes (Signed)
History     CSN: [id number]  Arrival date & time 09/19/11  1141   First MD Initiated Contact with Patient 09/19/11 1144      Chief Complaint  Patient presents with  . Code Stroke    (Consider location/radiation/quality/duration/timing/severity/associated sxs/prior treatment) The history is provided by the patient.   patient was brought in as a code stroke. She has a headache and has had stuttering. Family members noticed the stuttering at 9 AM. Last normal was last night. She states it is a throbbing headache. No other numbness or weakness. No fevers. No chest pain. No palpitations. She's not had stuttering before. She states she was talking with her brother on the phone when it started.  Past Medical History  Diagnosis Date  . Colon polyp     polypoid colorectal mucosa  . Diverticulosis   . Depression   . Nephrolithiasis   . Sleep apnea   . Hypertension   . Obesity   . Diabetes mellitus   . Hemorrhoids   . Hernia of unspecified site of abdominal cavity without mention of obstruction or gangrene   . Arthritis   . Anemia   . Gastritis   . Esophagitis   . Barrett esophagus 10/14/10    Past Surgical History  Procedure Date  . Left sided lithotripsy   . Cholecystectomy   . Knee arthroscopy   . Cardiac catheterization   . Hernia repair   . Tubal ligation   . Cesarean section 1986    Family History  Problem Relation Age of Onset  . Heart disease Mother   . Heart disease Father   . Diabetes Brother   . Diabetes Sister   . Kidney cancer Brother     History  Substance Use Topics  . Smoking status: Never Smoker   . Smokeless tobacco: Not on file  . Alcohol Use: 0.6 oz/week    1 Glasses of wine per week     wine sometimes on holidays and bedtime    OB History    Grav Para Term Preterm Abortions TAB SAB Ect Mult Living                  Review of Systems  Constitutional: Negative for diaphoresis and fatigue.  Eyes: Negative for pain.  Respiratory: Negative  for choking.   Cardiovascular: Negative for chest pain.  Gastrointestinal: Negative for abdominal pain.  Genitourinary: Negative for frequency and flank pain.  Musculoskeletal: Negative for back pain.  Neurological: Positive for speech difficulty and headaches. Negative for seizures and numbness.  Psychiatric/Behavioral: Negative for confusion.    Allergies  Codeine and Nsaids  Home Medications   Current Outpatient Rx  Name Route Sig Dispense Refill  . AMLODIPINE BESYLATE 5 MG PO TABS Oral Take 5 mg by mouth daily.      . ASPIRIN EC 81 MG PO TBEC Oral Take 162 mg by mouth daily.    Marland Kitchen DICYCLOMINE HCL 20 MG PO TABS Oral Take 20 mg by mouth every 6 (six) hours.    Di Kindle SULFATE 325 (65 FE) MG PO TABS Oral Take 325 mg by mouth 3 (three) times daily.    Marland Kitchen HYDROCODONE-ACETAMINOPHEN 7.5-325 MG PO TABS Oral Take 1 tablet by mouth every 8 (eight) hours as needed for pain. 75 tablet 0  . METFORMIN HCL 1000 MG PO TABS Oral Take 1,000 mg by mouth 2 (two) times daily with a meal.      . MIRABEGRON ER 50 MG PO TB24  Oral Take 50 mg by mouth daily.    . MULTIVITAL PO Oral Take 1 capsule by mouth daily.      Marland Kitchen OLMESARTAN MEDOXOMIL 40 MG PO TABS Oral Take 40 mg by mouth daily.      . OMEGA 3 1000 MG PO CAPS Oral Take 1 capsule by mouth daily.      Marland Kitchen OMEPRAZOLE 20 MG PO CPDR Oral Take 20 mg by mouth daily.    Marland Kitchen ALIGN 4 MG PO CAPS Oral Take 1 capsule by mouth daily.    . PSYLLIUM 28.3 % PO POWD  Takes 2 tsp in 8 ounces of water once a day    . ROSUVASTATIN CALCIUM 20 MG PO TABS Oral Take 20 mg by mouth daily.      . TRAVOPROST (BAK FREE) 0.004 % OP SOLN Both Eyes Place 1 drop into both eyes at bedtime.       BP 149/82  Pulse 92  Temp 98 F (36.7 C) (Oral)  Resp 20  SpO2 98%  Physical Exam  Nursing note and vitals reviewed. Constitutional: She is oriented to person, place, and time. She appears well-developed and well-nourished.  HENT:  Head: Normocephalic and atraumatic.  Eyes: EOM are  normal. Pupils are equal, round, and reactive to light.  Neck: Normal range of motion. Neck supple.  Cardiovascular: Normal rate, regular rhythm and normal heart sounds.   No murmur heard. Pulmonary/Chest: Effort normal and breath sounds normal. No respiratory distress. She has no wheezes. She has no rales.  Abdominal: Soft. Bowel sounds are normal. She exhibits no distension. There is no tenderness. There is no rebound and no guarding.  Musculoskeletal: Normal range of motion.  Neurological: She is alert and oriented to person, place, and time. No cranial nerve deficit.       Patient is awake and appropriate. Moves all extremities equally. Has stuttering at times. During the exam she is a tremor of the right hand that also comes and go. Complete NIH score done by neurology.  Skin: Skin is warm and dry.  Psychiatric: She has a normal mood and affect. Her speech is normal.    ED Course  Procedures (including critical care time)  Labs Reviewed  CBC - Abnormal; Notable for the following:    RDW 16.0 (*)     All other components within normal limits  COMPREHENSIVE METABOLIC PANEL - Abnormal; Notable for the following:    Glucose, Bld 163 (*)     GFR calc non Af Amer 71 (*)     GFR calc Af Amer 82 (*)     All other components within normal limits  POCT I-STAT, CHEM 8 - Abnormal; Notable for the following:    Glucose, Bld 164 (*)     All other components within normal limits  PROTIME-INR  APTT  DIFFERENTIAL  CK TOTAL AND CKMB  TROPONIN I   Ct Head Wo Contrast  09/19/2011  *RADIOLOGY REPORT*  Clinical Data: No evidence for dense MCA.  . Code stroke.  Speaking difficulty.  CT HEAD WITHOUT CONTRAST  Technique:  Contiguous axial images were obtained from the base of the skull through the vertex without contrast.  Comparison: None.  Findings: There is no evidence for acute hemorrhage, hydrocephalus, mass lesion, or abnormal extra-axial fluid collection.  No definite CT evidence for acute  infarction.  Patchy low attenuation in the deep hemispheric and periventricular white matter is nonspecific, but likely reflects chronic microvascular ischemic demyelination. The visualized paranasal sinuses and  mastoid air cells are clear  IMPRESSION: No acute intracranial abnormality.  Chronic microvascular ischemic white matter demyelination.  I personally called the results of the study to the physician on call for the stroke team at pager [phone].  This call was made at to 11:57 a.m. on 09/19/2011.   Original Report Authenticated By: ERIC A. MANSELL, M.D.      1. Stuttering   2. SVT (supraventricular tachycardia)     Date: 09/19/2011  Rate: 65  Rhythm: normal sinus rhythm and premature atrial contractions (PAC)  QRS Axis: normal  Intervals: normal  ST/T Wave abnormalities: normal  Conduction Disutrbances:none  Narrative Interpretation:   Old EKG Reviewed: unchanged   Date: 09/19/2011  Rate: 161  Rhythm: supraventricular tachycardia (SVT)  QRS Axis: normal  Intervals: normal  ST/T Wave abnormalities: nonspecific ST/T changes  Conduction Disutrbances:none  Narrative Interpretation:   Old EKG Reviewed: changes noted      MDM  Patient with stuttering. Negative head CT. Seen by neurology and requested MRI. Not a TPA candidate to 2 severity of symptoms and timing. While in ER she was found to have episodes of SVT. No history of this per the patient. They last few seconds. She will be admitted to medicine with cardiology consult.        Juliet Rude. Rubin Payor, MD 09/19/11 1350

## 2011-09-19 NOTE — H&P (Signed)
[facility] History and Physical  Jade Ellison VWU:[id number] DOB: 22-Jul-1945 DOA: 09/19/2011  Referring physician: Carmell Ellison, ER physician PCP: Jade Rowan, MD   Chief Complaint: Headache and stuttering speech  HPI: Jade Ellison is a 66 y.o. female with past medical history of diabetes mellitus, obstructive sleep apnea and hypertension who today started having episodes of headache and stuttering speech. There is a concern for possibility of stroke and patient came into the emergency room. She was noted to have some elevated blood pressures and a CT scan of the head was negative. Neurology was called for consultation evaluation found that her speech to be improved with distraction. They felt that this was more stress related versus migraine with aura. The plan was to treat the patient in the emergency room with medicine for anxiety and send her home but it was noted that she started having rapid heart rate. EKG confirmed rapid SVT and it was felt patient needed to come in for further admission. Patient initially was meant to go to the floor, but upon arrival had continued SVT was not responding to IV Lopressor so she was transferred to the step down unit. [facility] cardiology was consult at and has seen the patient as well.                   Review of Systems: when I saw the patient, she was quite tearful and agitated. She complained of a mild headache. She denied any vision changes, dysphasia, chest pain, shortness of breath, wheeze, cough. She did complain of palpitations. She denied any abdominal pain, hematuria, dysuria, constipation, diarrhea, focal extremity numbness weakness or pain. Review systems is otherwise negative.   Past Medical History  Diagnosis Date  . Colon polyp     polypoid colorectal mucosa  . Diverticulosis   . Depression   . Nephrolithiasis   . Sleep apnea   . Hypertension   . Obesity   . Diabetes mellitus   . Hemorrhoids   . Hernia of  unspecified site of abdominal cavity without mention of obstruction or gangrene   . Arthritis   . Anemia   . Gastritis   . Esophagitis   . Barrett esophagus 10/14/10   Past Surgical History  Procedure Date  . Left sided lithotripsy   . Cholecystectomy   . Knee arthroscopy   . Cardiac catheterization   . Hernia repair   . Tubal ligation   . Cesarean section 1986   Social History:  reports that she has never smoked. She does not have any smokeless tobacco history on file. She reports that she drinks about .6 ounces of alcohol per week. She reports that she does not use illicit drugs. Patient lives at home with other family members. She is normally able to participate in almost all activities of daily living. She says that lately she's been under a increased amount of stress because she cares for her 50-year-old granddaughter while her daughter works which she feels is becoming very overwhelming.   Allergies  Allergen Reactions  . Codeine Rash  . Nsaids     Abdominal Pain    Family History  Problem Relation Age of Onset  . Heart disease Mother   . Heart disease Father   . Diabetes Brother   . Diabetes Sister   . Kidney cancer Brother      Prior to Admission medications   Medication Sig Start Date End Date Taking? Authorizing Provider  amLODipine (NORVASC) 5 MG tablet Take 5  mg by mouth daily.     Yes Historical Provider, MD  aspirin EC 81 MG tablet Take 162 mg by mouth daily.   Yes Historical Provider, MD  dicyclomine (BENTYL) 20 MG tablet Take 20 mg by mouth every 6 (six) hours.   Yes Historical Provider, MD  ferrous sulfate 325 (65 FE) MG tablet Take 325 mg by mouth 3 (three) times daily.   Yes Historical Provider, MD  HYDROcodone-acetaminophen (NORCO) 7.5-325 MG per tablet Take 1 tablet by mouth every 8 (eight) hours as needed for pain. 08/29/11  Yes Ranelle Oyster, MD  losartan (COZAAR) 50 MG tablet Take 50 mg by mouth daily.   Yes Historical Provider, MD  metFORMIN  (GLUCOPHAGE) 1000 MG tablet Take 1,000 mg by mouth 2 (two) times daily with a meal.     Yes Historical Provider, MD  mirabegron ER (MYRBETRIQ) 50 MG TB24 Take 50 mg by mouth daily.   Yes Historical Provider, MD  Multiple Vitamins-Minerals (MULTIVITAL PO) Take 1 capsule by mouth daily.     Yes Historical Provider, MD  OMEGA 3 1000 MG CAPS Take 1 capsule by mouth daily.     Yes Historical Provider, MD  omeprazole (PRILOSEC) 20 MG capsule Take 20 mg by mouth daily.   Yes Historical Provider, MD  Probiotic Product (ALIGN) 4 MG CAPS Take 1 capsule by mouth daily.   Yes Historical Provider, MD  Psyllium (METAMUCIL) 28.3 % POWD Takes 2 tsp in 8 ounces of water once a day   Yes Historical Provider, MD  rosuvastatin (CRESTOR) 20 MG tablet Take 20 mg by mouth daily.     Yes Historical Provider, MD  Travoprost, BAK Free, (TRAVATAMN) 0.004 % SOLN ophthalmic solution Place 1 drop into both eyes at bedtime.    Yes Historical Provider, MD   Physical Exam: Filed Vitals:   09/19/11 1559 09/19/11 1745 09/19/11 1845 09/19/11 2000  BP: 148/90 149/85 141/83 131/59  Pulse:  84    Temp:   99 F (37.2 C) 98.8 F (37.1 C)  TempSrc:   Oral Oral  Resp:    20  Height:   5\' 3"  (1.6 m)   Weight:   127.9 kg (281 lb 15.5 oz)   SpO2:   95% 94%     General:   Alert and oriented x3, mild distress secondary to feeling very anxious, tearful, fatigued and looks younger than stated age  Eyes:  Sclera nonicteric, extraocular movements are intact  ENT:  normocephalic, atraumatic, mucous membranes are moist  Neck:  no thyromegaly, neck is thick, no carotid bruits   Cardiovascular: Currently rate controlled, regular rhythm, S1-S2   Respiratory:  clear to auscultation bilaterally  Abdomen:  soft, nontender, nondistended, morbidly obese, positive bowel sounds   Skin:  no skin breaks tears or lesions  Musculoskeletal: No clubbing or cyanosis or edema   Psychiatric: Patient is anxious but otherwise appropriate, no  evidence of acute psychoses   Neurologic:  no focal deficits, normal finger to nose. Negative for Babinski. Musculoskeletal exam notes close to symmetric 5 minus/5 upper and lower extremity for flexion extension and grip. She has some mild tremor but looks to be more irregular and not consistent   Labs on Admission:  Basic Metabolic Panel:  Lab 09/19/11 7829 09/19/11 1150  NA 140 141  K 3.8 3.8  CL 102 101  CO2 -- 28  GLUCOSE 164* 163*  BUN 16 16  CREATININE 0.90 0.84  CALCIUM -- 10.1  MG -- --  PHOS -- --   Liver Function Tests:  Lab 09/19/11 1150  AST 16  ALT 9  ALKPHOS 86  BILITOT 0.8  PROT 7.4  ALBUMIN 3.8   CBC:  Lab 09/19/11 1157 09/19/11 1150  WBC -- 4.8  NEUTROABS -- 3.2  HGB 13.9 12.9  HCT 41.0 40.1  MCV -- 81.2  PLT -- 195   Cardiac Enzymes:  Lab 09/19/11 1926 09/19/11 1150  CKTOTAL -- 167  CKMB -- 2.9  CKMBINDEX -- --  TROPONINI <0.30 <0.30   CBG:  Lab 09/19/11 1603  GLUCAP 105*    Radiological Exams on Admission: Ct Head Wo Contrast  09/19/2011    IMPRESSION: No acute intracranial abnormality.  Chronic microvascular ischemic white matter demyelination.  I personally called the results of the study to the physician on call for the stroke team at pager [phone].  This call was made at to 11:57 a.m. on 09/19/2011.   Original Report Authenticated By: ERIC A. MANSELL, M.D.    Mr Brain Wo Contrast  09/19/2011   IMPRESSION: 1. No acute intracranial abnormality. 2.  Mild for age nonspecific white matter signal changes, most commonly due to small vessel disease.   Original Report Authenticated By: Harley Hallmark, M.D.     EKG: Independently reviewed.  SVT   Assessment/Plan Principal Problem:  *SVT (supraventricular tachycardia): Appreciate cardiology help. Continue work on rate control. 2-D echo pending. Hopefully by treating anxiety, this will help with her heart rate.  Active Problems:  DIABETES MELLITUS: Stable. Slight nasal. A1c is  excellent.   OBESITY: Stable.   DEPRESSION: Counseled. Treating anxiety with when necessary Xanax.   HYPERTENSION: Continue home meds plus chronotropic agents.   SLEEP APNEA: Stable.   Anemia: Stable.   Aphagia  Migraine headache with aura: This is not a stroke. CT and MRI confirmed this. This is more migraine with aura versus stress related.  1.   Code Status:  full code  Family Communication:  plan discussed with multiple family members in the room Disposition Plan: Home tomorrow  Time spent:  50 minutes  Hollice Espy [facility] Pager [phone]   If 7PM-7AM, please contact night-coverage [email or web address] Password [facility] 09/19/2011, 8:26 PM

## 2011-09-19 NOTE — Progress Notes (Signed)
Disposition Note  Jade Ellison, is a 66 y.o. female,   MRN: [id number]  -  DOB - 19-Apr-1945  Outpatient Primary MD for the patient is [facility], [place] Past Medical History  Diagnosis Date  . Colon polyp     polypoid colorectal mucosa  . Diverticulosis   . Depression   . Nephrolithiasis   . Sleep apnea   . Hypertension   . Obesity   . Diabetes mellitus   . Hemorrhoids   . Hernia of unspecified site of abdominal cavity without mention of obstruction or gangrene   . Arthritis   . Anemia   . Gastritis   . Esophagitis   . Barrett esophagus 10/14/10     Blood pressure 149/82, pulse 92, temperature 98 F (36.7 C), temperature source Oral, resp. rate 20, SpO2 98.00%.  Active Problems:  SVT (supraventricular tachycardia)  Aphagia  DIABETES MELLITUS  OBESITY  DEPRESSION  HYPERTENSION  SLEEP APNEA  Anemia   66 yo female who was last seen normal last night at bedtime was found to have stuttering slurred speech at 9 am this morning.  She was brought to the ED CT head was negative for stroke.  She was seen by neuro who did not think this was a stroke but recommended getting an MRI and discharging the patient.    During this time period the EDP began to notice short episodes of SVT on the monitor, this was captured in her EKG.  [facility] was called as she is a patient of Dr. Tresa Endo.  They have agreed to consult.    At this point the patient is still stuttering.  She is reportedly asymptomatic from the SVT.  I have requested a cardiac tele bed with neuro focus for this patient with acute onset of aphagia and newly observed SVT.  Algis Downs, PA-C [facility] Pager: [phone]

## 2011-09-19 NOTE — H&P (Signed)
Reason for Consult: SVT  Referring Physician: ER MD   Jade Ellison is an 66 y.o. female.    Chief Complaint:  Pt brought in by EMS. "Code Stroke" with stuttering and headache.  HPI: 66 yo female who was last seen normal last night at bedtime was found to have stuttering slurred speech at 9 am this morning. She was brought to the ED CT head was negative for stroke. She was seen by neuro who did not think this was a stroke but recommended getting an MRI and discharging the patient. During this time period the EDP began to notice short episodes of SVT on the monitor, this was captured on EKG. [facility] is called for consult as she is a patient of Dr. Allyson Sabal. At this point the patient is still stuttering. She is reportedly asymptomatic from the SVT.  Though multiple episodes continue to occur.  IV lopressor 5 mg IV has been given.  The patient and her daughter state she awoke feeling well, she was talking to her brother on the telephone she began stuttering this was new for her. She had the headache first and then the stuttering began.   The stuttering was significant on arrival to the emergency room but currently only on a few words does it began and quickly resolves.  Her headache continues to be fairly significant.     Patient has a history of normal coronary arteries per cardiac catheterization in 2003 other history includes obstructive sleep apnea unable to wear CPAP, hypertension hyperlipidemia type 2 diabetes mellitus.  She was last seen by Dr. Allyson Sabal July 2013, she was stable at that time and I find no history of SVT in the past. She occurred in September 2007 EF was greater than 55% there was trace mitral regurg and trace tricuspid regurg.   Past Medical History  Diagnosis Date  . Colon polyp     polypoid colorectal mucosa  . Diverticulosis   . Depression   . Nephrolithiasis   . Sleep apnea   . Hypertension   . Obesity   . Diabetes mellitus   . Hemorrhoids   . Hernia of unspecified site  of abdominal cavity without mention of obstruction or gangrene   . Arthritis   . Anemia   . Gastritis   . Esophagitis   . Barrett esophagus 10/14/10    Past Surgical History  Procedure Date  . Left sided lithotripsy   . Cholecystectomy   . Knee arthroscopy   . Cardiac catheterization   . Hernia repair   . Tubal ligation   . Cesarean section 1986    Family History  Problem Relation Age of Onset  . Heart disease Mother   . Heart disease Father   . Diabetes Brother   . Diabetes Sister   . Kidney cancer Brother    Social History:  reports that she has never smoked. She does not have any smokeless tobacco history on file. She reports that she drinks about .6 ounces of alcohol per week. She reports that she does not use illicit drugs.  Allergies:  Allergies  Allergen Reactions  . Codeine Rash  . Nsaids     Abdominal Pain    Medications Prior to Admission  Medication Sig Dispense Refill  . amLODipine (NORVASC) 5 MG tablet Take 5 mg by mouth daily.        Marland Kitchen aspirin EC 81 MG tablet Take 162 mg by mouth daily.      Marland Kitchen dicyclomine (BENTYL) 20 MG  tablet Take 20 mg by mouth every 6 (six) hours.      . ferrous sulfate 325 (65 FE) MG tablet Take 325 mg by mouth 3 (three) times daily.      Marland Kitchen HYDROcodone-acetaminophen (NORCO) 7.5-325 MG per tablet Take 1 tablet by mouth every 8 (eight) hours as needed for pain.  75 tablet  0  . losartan (COZAAR) 50 MG tablet Take 50 mg by mouth daily.      . metFORMIN (GLUCOPHAGE) 1000 MG tablet Take 1,000 mg by mouth 2 (two) times daily with a meal.        . mirabegron ER (MYRBETRIQ) 50 MG TB24 Take 50 mg by mouth daily.      . Multiple Vitamins-Minerals (MULTIVITAL PO) Take 1 capsule by mouth daily.        . OMEGA 3 1000 MG CAPS Take 1 capsule by mouth daily.        Marland Kitchen omeprazole (PRILOSEC) 20 MG capsule Take 20 mg by mouth daily.      . Probiotic Product (ALIGN) 4 MG CAPS Take 1 capsule by mouth daily.      . Psyllium (METAMUCIL) 28.3 % POWD Takes 2  tsp in 8 ounces of water once a day      . rosuvastatin (CRESTOR) 20 MG tablet Take 20 mg by mouth daily.        . Travoprost, BAK Free, (TRAVATAMN) 0.004 % SOLN ophthalmic solution Place 1 drop into both eyes at bedtime.         Results for orders placed during the hospital encounter of 09/19/11 (from the past 48 hour(s))  PROTIME-INR     Status: Normal   Collection Time   09/19/11 11:50 AM      Component Value Range Comment   Prothrombin Time 13.4  11.6 - 15.2 seconds    INR 1.00  0.00 - 1.49   APTT     Status: Normal   Collection Time   09/19/11 11:50 AM      Component Value Range Comment   aPTT 28  24 - 37 seconds   CBC     Status: Abnormal   Collection Time   09/19/11 11:50 AM      Component Value Range Comment   WBC 4.8  4.0 - 10.5 K/uL    RBC 4.94  3.87 - 5.11 MIL/uL    Hemoglobin 12.9  12.0 - 15.0 g/dL    HCT 19.1  47.8 - 29.5 %    MCV 81.2  78.0 - 100.0 fL    MCH 26.1  26.0 - 34.0 pg    MCHC 32.2  30.0 - 36.0 g/dL    RDW 62.1 (*) 30.8 - 15.5 %    Platelets 195  150 - 400 K/uL   DIFFERENTIAL     Status: Normal   Collection Time   09/19/11 11:50 AM      Component Value Range Comment   Neutrophils Relative 66  43 - 77 %    Neutro Abs 3.2  1.7 - 7.7 K/uL    Lymphocytes Relative 24  12 - 46 %    Lymphs Abs 1.1  0.7 - 4.0 K/uL    Monocytes Relative 8  3 - 12 %    Monocytes Absolute 0.4  0.1 - 1.0 K/uL    Eosinophils Relative 2  0 - 5 %    Eosinophils Absolute 0.1  0.0 - 0.7 K/uL    Basophils Relative 0  0 - 1 %  Basophils Absolute 0.0  0.0 - 0.1 K/uL   COMPREHENSIVE METABOLIC PANEL     Status: Abnormal   Collection Time   09/19/11 11:50 AM      Component Value Range Comment   Sodium 141  135 - 145 mEq/L    Potassium 3.8  3.5 - 5.1 mEq/L    Chloride 101  96 - 112 mEq/L    CO2 28  19 - 32 mEq/L    Glucose, Bld 163 (*) 70 - 99 mg/dL    BUN 16  6 - 23 mg/dL    Creatinine, Ser 1.61  0.50 - 1.10 mg/dL    Calcium 09.6  8.4 - 10.5 mg/dL    Total Protein 7.4  6.0 - 8.3 g/dL     Albumin 3.8  3.5 - 5.2 g/dL    AST 16  0 - 37 U/L    ALT 9  0 - 35 U/L    Alkaline Phosphatase 86  39 - 117 U/L    Total Bilirubin 0.8  0.3 - 1.2 mg/dL    GFR calc non Af Amer 71 (*) >90 mL/min    GFR calc Af Amer 82 (*) >90 mL/min   CK TOTAL AND CKMB     Status: Normal   Collection Time   09/19/11 11:50 AM      Component Value Range Comment   Total CK 167  7 - 177 U/L    CK, MB 2.9  0.3 - 4.0 ng/mL    Relative Index 1.7  0.0 - 2.5   TROPONIN I     Status: Normal   Collection Time   09/19/11 11:50 AM      Component Value Range Comment   Troponin I <0.30  <0.30 ng/mL   POCT I-STAT, CHEM 8     Status: Abnormal   Collection Time   09/19/11 11:57 AM      Component Value Range Comment   Sodium 140  135 - 145 mEq/L    Potassium 3.8  3.5 - 5.1 mEq/L    Chloride 102  96 - 112 mEq/L    BUN 16  6 - 23 mg/dL    Creatinine, Ser 0.45  0.50 - 1.10 mg/dL    Glucose, Bld 409 (*) 70 - 99 mg/dL    Calcium, Ion 8.11  9.14 - 1.30 mmol/L    TCO2 26  0 - 100 mmol/L    Hemoglobin 13.9  12.0 - 15.0 g/dL    HCT 78.2  95.6 - 21.3 %   GLUCOSE, CAPILLARY     Status: Abnormal   Collection Time   09/19/11  4:03 PM      Component Value Range Comment   Glucose-Capillary 105 (*) 70 - 99 mg/dL    Comment 1 Notify RN      Ct Head Wo Contrast  09/19/2011  *RADIOLOGY REPORT*  Clinical Data: No evidence for dense MCA.  . Code stroke.  Speaking difficulty.  CT HEAD WITHOUT CONTRAST  Technique:  Contiguous axial images were obtained from the base of the skull through the vertex without contrast.  Comparison: None.  Findings: There is no evidence for acute hemorrhage, hydrocephalus, mass lesion, or abnormal extra-axial fluid collection.  No definite CT evidence for acute infarction.  Patchy low attenuation in the deep hemispheric and periventricular white matter is nonspecific, but likely reflects chronic microvascular ischemic demyelination. The visualized paranasal sinuses and mastoid air cells are clear  IMPRESSION: No  acute intracranial abnormality.  Chronic  microvascular ischemic white matter demyelination.  I personally called the results of the study to the physician on call for the stroke team at pager [phone].  This call was made at to 11:57 a.m. on 09/19/2011.   Original Report Authenticated By: ERIC A. MANSELL, M.D.    Mr Brain Wo Contrast  09/19/2011  *RADIOLOGY REPORT*  Clinical Data: 66 year old female Code stroke, abnormal speech and stuttering. Severe headache.  MRI HEAD WITHOUT CONTRAST  Technique:  Multiplanar, multiecho pulse sequences of the brain and surrounding structures were obtained according to standard protocol without intravenous contrast.  Comparison: Head CT without contrast 1150 hours the same day.  Findings: Mildly heterogeneous diffusion signal in the brainstem, but No restricted diffusion to suggest acute infarction.  Major intracranial vascular flow voids are preserved.  Partially empty sella configuration.  No ventriculomegaly. No midline shift, mass effect, or evidence of mass lesion.  No acute intracranial hemorrhage identified.     Cervicomedullary junction is within normal limits.  Mild degenerative ligamentous hypertrophy about the odontoid, otherwise negative visualized cervical spine.  Mild scattered cerebral and brain stem white matter T2 and FLAIR hyperintensity.  Deep gray matter nuclei and cerebellum are within normal limits.  Postoperative changes to the right globe.  Minimal paranasal sinus mucosal thickening.  Normal bone marrow signal.  Negative scalp soft tissues.  IMPRESSION: 1. No acute intracranial abnormality. 2.  Mild for age nonspecific white matter signal changes, most commonly due to small vessel disease.   Original Report Authenticated By: Ulla Potash III, M.D.     ROS: General:No recent colds, fevers, No tick bites. Skin:No rashes no ulcers HEENT:No blurred vision no double vision JY:NWGNFA any chest pain PUL:She has chronic shortness of breath but that has not  increased at all in the last several weeks or today, She has had a chronic hacking cough all summer.  She was given nebulizer at one point but she rarely uses. GI:No diarrhea constipation or melena GU:No hematuria or dysuria MS:Her joint pain no back pain Neuro:Stuttering today otherwise no neuro problems until today, No history of migraines Endo:She is diabetic her diabetes has been controlled she has had 48 pound weight loss over the last 9 months that she has tried to do   Blood pressure 149/85, pulse 84, temperature 98.3 F (36.8 C), temperature source Oral, resp. rate 20, SpO2 96.00%. PE: General:Alert and oriented when talking to her but easily drifts to sleep Skin:Warm and dry brisk capillary refill no rashes HEENT:Normocephalic sclera clear, positive facial symmetry with smile and frown, tongue midline Neck:supple no JVD, no carotid bruits Heart:S1S2 RRR with occ. Runs of rapid HR. Lungs:clear without rales, rhonchi or wheezes Abd:+ BS, soft, non tender Ext:no edema, 2 + pedal pulses bil.,  Neuro:Oriented to person, place and time, stuttering on occ. Word.  Grips and push pull of upper ext. Equal.  Finger strength equal.  Lower ext. With equal push pull.      Assessment/Plan Principal Problem:  *SVT (supraventricular tachycardia) Active Problems:  DIABETES MELLITUS  OBESITY  DEPRESSION  HYPERTENSION  SLEEP APNEA  Anemia  Aphagia  PLAN: She continues with episodes of SVT with a heart rate of 170. We have given 5 mg IV Lopressor, though the tachycardia continues, frequently.  Will add IV cardizem and transfer to stepdown, if no step down beds will need ICU. Follow cardiac enzymes.  Currently negative.  MRI and CT of head without acute process.  Will check 2 d echo. ? therapeutic heparin.  Will give  compazine, and benadryl for headache now.  Also pt on Mirabegron for bladder spasms for 1 month.  Side effects are headache and tach.  Will D/C for now.  Will hold norvasc while  on Dilt.   INGOLD,LAURA R 09/19/2011, 5:58 PM  ATTENDING ATTESTATION:  I have seen and examined the patient along with Nada Boozer, NP.  I have reviewed the chart, notes and new data.  I agree with Laura's findings, examination & recommendations as noted above.  Brief Description: 66 year old morbidly obese woman with the type 2 diabetes on insulin, OSA on CPAP who is followed with Dr. Allyson Sabal at [facility] for risk factor modification with hypertension hyperlipidemia. He has had a heart catheterization 2003 which showed normal coronary arteries. She knew relatively well until last night when she started having some stuttering speech concerning for possible TIA or stroke symptoms. She's been evaluated with MRI and CTA of the head/brain which did not reveal any significant findings.  Almost surreptitiously, she has been having episodes of a essentially asymptomatic SVT with an ECG showed documenting a rate of 161 beats a minute. She has noted some palpitation in the past but no significant episodes and nose appear near syncope type symptoms.  Her exam is essentially benign for an abnormal cardiac findings.  Key new findings / data: MRI of brain and CT of the head noted  Recommendation:  I agree with the beta blocker therapy. Beta blockers likely more Vicodin calcitonin blockers.  Hopefully as the beta-blockade level increases, her episodes will come as frequent.  Check 2-D echocardiogram to ensure no structural abnormalities however previous echocardiogram in our office was relatively normal with no evidence of mitral prolapse just a mild concentric LVH.  She were to go into prolonged episode of SVT overnight would consider vagal maneuvers such as carotid massage or coughing/Valsalva. If this is not successful would use IV adenosine.  We will continue to monitor for any further episodes.  Marykay Lex, M.D., M.S. [facility] [address] [place], [place]  [address]  [phone]  09/19/2011 8:10 PM

## 2011-09-19 NOTE — ED Notes (Signed)
Patient at home witnessed at 0900 stuttering, slurred speech, and headache. EMS called and transported patient as a code stroke.  Currently patient ax4 answering and following commands appropriate. Headache 8/10. Throbbing.

## 2011-09-19 NOTE — Care Management Note (Unsigned)
    Page 1 of 1   09/19/2011     4:09:54 PM   CARE MANAGEMENT NOTE 09/19/2011  Patient:  Jade Ellison, Jade Ellison   Account Number:  [id number]  Date Initiated:  09/19/2011  Documentation initiated by:  GRAVES-BIGELOW,Aalijah Mims  Subjective/Objective Assessment:   Pt admitted with a code stoke called. Plan for MRI.     Action/Plan:   CM will continue to monitor for disposition needs.   Anticipated DC Date:  09/22/2011   Anticipated DC Plan:  HOME W HOME HEALTH SERVICES         Choice offered to / List presented to:             Status of service:  In process, will continue to follow Medicare Important Message given?   (If response is "NO", the following Medicare IM given date fields will be blank) Date Medicare IM given:   Date Additional Medicare IM given:    Discharge Disposition:    Per UR Regulation:  Reviewed for med. necessity/level of care/duration of stay  If discussed at Long Length of Stay Meetings, dates discussed:    Comments:

## 2011-09-19 NOTE — ED Notes (Signed)
Patient called a code stroke enroute to hospital per EMS at 1131.  Patient arrived to ED 1140 with Stroke team arrival at 1136 and EDP 1140. Last seen normal 0900. Patient arrived to CT at 1143 and phlebotomist arrival at 33. Neurologist Dr Amada Jupiter cancelled code stroke at 60. NIHSS completed score zero with intermittent stuttering resolves with distraction under #9 best language.

## 2011-09-19 NOTE — Consult Note (Signed)
Reason for Consult: Stuttering Referring Physician: Benjiman Core  CC: Stuttering  History is obtained from: Patient  HPI: Jade Ellison is an 66 y.o. female who was normal up until about 9 AM this morning at which point she was stuttering. Because of the severity of her stuttering, she called EMS and was brought in as a code stroke.  On arrival here, she did have stuttering but would be able to speak normally if she were distracted. She also had a mild tremor on finger-nose-finger on the right that also appear distractible.  She does endorse being under a lot of stress at home. But doesn't feel like anything recent majorly has changed.  She also does complain of headache  ROS: An 11 point ROS was performed and is negative except as noted in the HPI.  Past Medical History  Diagnosis Date  . Colon polyp     polypoid colorectal mucosa  . Diverticulosis   . Depression   . Nephrolithiasis   . Sleep apnea   . Hypertension   . Obesity   . Diabetes mellitus   . Hemorrhoids   . Hernia of unspecified site of abdominal cavity without mention of obstruction or gangrene   . Arthritis   . Anemia   . Gastritis   . Esophagitis   . 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 Immediate and remote memory are intact. Patient is able to give a clear and coherent history, however she frequently stutters during the interview. This does appear  to be distractible and when she begins talking without thinking about it, she is able to talk fluently. Cranial Nerves: II: Visual Fields are full. Pupils are equal, round, and reactive to light.  Discs are sharp. III,IV, VI: EOMI without ptosis or diploplia.  V,VII: Facial sensation and movement are symmetric.  VIII: hearing is intact to voice X: Uvula elevates symmetrically XI: Shoulder shrug is symmetric. XII: tongue is midline without atrophy or fasciculations.  Motor: Tone is normal. Bulk is normal. 5/5 strength was present in all four extremities.  Sensory: Sensation is symmetric to light touch and temperature in the arms and legs. Deep Tendon Reflexes: 2+ and symmetric in the biceps and patellae.  Plantars: Toes are downgoing bilaterally.  Cerebellar: She does have a mild tremor on finger nose finger on the right, however this also appears distractible but less reliably so than her stuttering Gait: Did not assess due to the urgent evaluation of the patient as a code stroke   I have reviewed labs in epic and the results pertinent to this consultation are: EMP, CBC unremarkable glucose mildly elevated  I have reviewed the images obtained: CT head-negative MRI head-nonspecific white matter changes, no stroke  Impression: Stuttering of nonorganic etiology. It is possible that this represents competent migraine and could consider treating her headache as migraine, but with the distractible nature of her deficit,  I suspect psychogenic etiology  Recommendations: 1) could consider Compazine 10 mg IV, Benadryl 25 mg IV for headache 2) no further neurological evaluation is necessary at this time.   Ritta Slot, MD [facility] [phone]

## 2011-09-19 NOTE — ED Notes (Signed)
Spoke with patient and daughter at bedside who is tearful and concerned for her mother.  Explained plan of care to patient and family member and verbalized understanding.

## 2011-09-19 NOTE — Progress Notes (Signed)
UR Completed Malu Pellegrini Graves-Bigelow, RN,BSN [phone]  

## 2011-09-19 NOTE — Progress Notes (Signed)
Called by RN for new admission from ED with lots of episodes of SVT.  Upon arrival to patients room, RN and family at bedside.  Patient laying in bed, denies CP or SOB stated she feels anxious.  MD paged and notified.  Orders received.  Recommend SDU bed for closer monitoring.  RN to call if assistance needed

## 2011-09-19 NOTE — ED Notes (Signed)
EKG taken at first arrival to ED room after CT scan while in room patient heart rate 160 second ekg taken and given to EDP

## 2011-09-19 NOTE — Progress Notes (Addendum)
Pt arrived to floor from ED and went into SVT, HR 170-160s; pt stated she felt palpations; pt anxious and shivering, states her head is 10/10 headache; pt frequently continued to flip in and out of SVT with palpations;  Rapid called and made aware, Dr.Krishnan paged and made aware along with Nada Boozer; pt was given 5mg  IV lopressor and 0.5mg  IV ativan, pt still continued to have bursts of SVT after receiving meds; pt was then started on Cardizem gtt going at 5mg , 10mg  bolus given and order to transfer pt to stepdown; report called to Joy,RN on 2900; pt transferred to 2927, pt remained in stable condition

## 2011-09-20 DIAGNOSIS — I10 Essential (primary) hypertension: Secondary | ICD-10-CM

## 2011-09-20 DIAGNOSIS — G43109 Migraine with aura, not intractable, without status migrainosus: Principal | ICD-10-CM

## 2011-09-20 HISTORY — DX: Essential (primary) hypertension: I10

## 2011-09-20 LAB — GLUCOSE, CAPILLARY

## 2011-09-20 MED ORDER — METOPROLOL TARTRATE 25 MG PO TABS: 25.0000 mg | ORAL_TABLET | Freq: Two times a day (BID) | ORAL | Status: DC

## 2011-09-20 MED ORDER — ALPRAZOLAM 0.25 MG PO TABS: 0.2500 mg | ORAL_TABLET | Freq: Two times a day (BID) | ORAL | Status: AC | PRN

## 2011-09-20 NOTE — Progress Notes (Addendum)
  Echocardiogram 2D Echocardiogram with Bubble study has been performed.  Jade Ellison Jade Ellison 09/20/2011, 4:54 PM

## 2011-09-20 NOTE — Evaluation (Signed)
Physical Therapy Evaluation Patient Details Name: Jade Ellison MRN: [id number] DOB: 22-Dec-1945 Today's Date: 09/20/2011 Time: 9528-4132 PT Time Calculation (min): 36 min  PT Assessment / Plan / Recommendation Clinical Impression  Patient s/p SVT with decr mobility secondary to pain in left knee and right hip.  Patient has difficulty with sit to stand and needs to work on her endurance as well.  Will benefit from purchasing a gait belt and HHPT services as well.      PT Assessment  Patient needs continued PT services    Follow Up Recommendations  Home health PT;Supervision/Assistance - 24 hour    Barriers to Discharge        Equipment Recommendations   (gait belt)    Recommendations for Other Services     Frequency Min 3X/week    Precautions / Restrictions Precautions Precautions: Fall Restrictions Weight Bearing Restrictions: No   Pertinent Vitals/Pain VSS, Some pain      Mobility  Bed Mobility Bed Mobility: Rolling Right;Right Sidelying to Sit;Sitting - Scoot to Edge of Bed Rolling Right: 4: Min assist Right Sidelying to Sit: 4: Min assist Sitting - Scoot to Edge of Bed: 4: Min assist Details for Bed Mobility Assistance: cues for technique.  Assist for safety as patient to far to right side of bed on arrival.   Transfers Transfers: Sit to Stand;Stand to Sit Sit to Stand: 3: Mod assist;With upper extremity assist;From elevated surface;From bed Stand to Sit: 4: Min assist;With upper extremity assist;To bed Details for Transfer Assistance: Patient needed cues for hand placement.  Patient pulled up on RW even though this PT instructed not to do so.  Patient and daughter confirm that that is the only way patient can get up at home secondary to left knee and right hip pain.  Patient reports her lift chair broke and the technician did not show last week to fix it.  Recommended a gait belt to daughter to use to assist with transfers in the mean time.  Daoughter is going to get  a gait belt.   Ambulation/Gait Ambulation/Gait Assistance: 4: Min assist Ambulation Distance (Feet): 65 Feet Assistive device: Rolling walker Ambulation/Gait Assistance Details: Patient needed cues for sequencing steps and RW.  Patient moves slowly and needed cues to stay close to RW as well.  Ambulates very slowly.   Gait Pattern: Step-to pattern;Decreased stride length;Decreased hip/knee flexion - left;Trunk flexed Gait velocity: decreased Stairs: No Wheelchair Mobility Wheelchair Mobility: No         PT Diagnosis: Generalized weakness  PT Problem List: Decreased mobility;Decreased activity tolerance PT Treatment Interventions: DME instruction;Gait training;Therapeutic activities;Therapeutic exercise;Balance training;Patient/family education;Functional mobility training   PT Goals Acute Rehab PT Goals PT Goal Formulation: With patient Time For Goal Achievement: 09/27/11 Potential to Achieve Goals: Good Pt will go Sit to Stand: with min assist;with upper extremity assist PT Goal: Sit to Stand - Progress: Goal set today Pt will Ambulate: 51 - 150 feet;with supervision;with least restrictive assistive device PT Goal: Ambulate - Progress: Goal set today  Visit Information  Last PT Received On: 09/20/11 Assistance Needed: +1    Subjective Data  Subjective: "I have a lot of trouble getting up from a chair." Patient Stated Goal: To go home   Prior Functioning  Home Living Lives With: Alone Available Help at Discharge: Family;Available PRN/intermittently (daughter) Type of Home: House Home Access: Level entry Home Layout: One level Bathroom Shower/Tub: Health visitor: Standard Home Adaptive Equipment: Walker - rolling;Straight cane;Bedside commode/3-in-1 Additional Comments:  uses cane at night, Uses RW for longer distances Prior Function Level of Independence: Independent with assistive device(s) Able to Take Stairs?: No Driving: No Vocation:  Retired Musician: No difficulties Dominant Hand: Right    Cognition  Overall Cognitive Status: Appears within functional limits for tasks assessed/performed Arousal/Alertness: Awake/alert Orientation Level: Appears intact for tasks assessed Behavior During Session: [facility] for tasks performed    Extremity/Trunk Assessment Right Upper Extremity Assessment RUE ROM/Strength/Tone: [facility] for tasks assessed Left Upper Extremity Assessment LUE ROM/Strength/Tone: [facility] for tasks assessed Right Lower Extremity Assessment RLE ROM/Strength/Tone: [facility] for tasks assessed Left Lower Extremity Assessment LLE ROM/Strength/Tone: [facility] for tasks assessed   Balance    End of Session PT - End of Session Equipment Utilized During Treatment: Gait belt Activity Tolerance: Patient tolerated treatment well Patient left: with call bell/phone within reach;in bed;with family/visitor present Nurse Communication: Mobility status       INGOLD,Yzabelle Calles 09/20/2011, 3:53 PM  [facility] [phone] (pager)

## 2011-09-20 NOTE — Progress Notes (Signed)
[facility]  DAILY PROGRESS NOTE   Subjective:  No events overnight. No further SVT episodes. Feels well today.  Objective:  Temp:  [98 F (36.7 C)-99 F (37.2 C)] 98.1 F (36.7 C) (09/07 0745) Pulse Rate:  [84-93] 84  (09/06 1745) Resp:  [18-24] 18  (09/07 0745) BP: (109-168)/(46-93) 127/65 mmHg (09/07 0720) SpO2:  [94 %-100 %] 96 % (09/07 0745) Weight:  [127.9 kg (281 lb 15.5 oz)] 127.9 kg (281 lb 15.5 oz) (09/06 1845) Weight change:   Intake/Output from previous day: 09/06 0701 - 09/07 0700 In: 280.8 [I.V.:280.8] Out: 425 [Urine:425]  Intake/Output from this shift: Total I/O In: 12 [I.V.:12] Out: -   Medications: Current Facility-Administered Medications  Medication Dose Route Frequency Provider Last Rate Last Dose  . 0.9 %  sodium chloride infusion   Intravenous STAT Juliet Rude. Pickering, MD      . 0.9 %  sodium chloride infusion  500 mL Intravenous Continuous Nada Boozer, NP      . 0.9 %  sodium chloride infusion   Intravenous Continuous Hollice Espy, MD      . acetaminophen (TYLENOL) tablet 650 mg  650 mg Oral Q6H PRN Hollice Espy, MD       Or  . acetaminophen (TYLENOL) suppository 650 mg  650 mg Rectal Q6H PRN Hollice Espy, MD      . ALPRAZolam Prudy Feeler) tablet 0.25 mg  0.25 mg Oral BID PRN Hollice Espy, MD      . ALPRAZolam Prudy Feeler) tablet 1 mg  1 mg Oral QHS Hollice Espy, MD      . alum & mag hydroxide-simeth (MAALOX/MYLANTA) 200-200-20 MG/5ML suspension 30 mL  30 mL Oral Q6H PRN Hollice Espy, MD      . aspirin EC tablet 162 mg  162 mg Oral Daily Nada Boozer, NP      . atorvastatin (LIPITOR) tablet 40 mg  40 mg Oral q1800 Nada Boozer, NP      . dicyclomine (BENTYL) tablet 20 mg  20 mg Oral Q6H Nada Boozer, NP   20 mg at 09/20/11 0758  . diltiazem (CARDIZEM) 1 mg/mL load via infusion 10 mg  10 mg Intravenous Once Hollice Espy, MD   10 mg at 09/19/11 1751   And  . diltiazem (CARDIZEM) 100 mg in  dextrose 5 % 100 mL infusion  5-15 mg/hr Intravenous Continuous Hollice Espy, MD   5 mg/hr at 09/19/11 2000  . diphenhydrAMINE (BENADRYL) injection 25 mg  25 mg Intravenous Once Nada Boozer, NP   25 mg at 09/19/11 2120  . enoxaparin (LOVENOX) injection 40 mg  40 mg Subcutaneous Q24H Hollice Espy, MD   40 mg at 09/19/11 2115  . ferrous sulfate tablet 325 mg  325 mg Oral TID Nada Boozer, NP   325 mg at 09/19/11 2115  . HYDROcodone-acetaminophen (NORCO) 7.5-325 MG per tablet 1 tablet  1 tablet Oral Q8H PRN Nada Boozer, NP      . insulin aspart (novoLOG) injection 0-5 Units  0-5 Units Subcutaneous QHS Hollice Espy, MD      . insulin aspart (novoLOG) injection 0-9 Units  0-9 Units Subcutaneous TID WC Hollice Espy, MD      . lactobacillus acidophilus (BACID) tablet 2 tablet  2 tablet Oral Daily Hollice Espy, MD   2 tablet at 09/19/11 2115  . levalbuterol (XOPENEX) nebulizer solution 0.63 mg  0.63 mg Nebulization Q6H PRN Sendil K  Rito Ehrlich, MD      . LORazepam (ATIVAN) injection 0.5 mg  0.5 mg Intravenous Q4H PRN Hollice Espy, MD   0.5 mg at 09/19/11 1650  . losartan (COZAAR) tablet 50 mg  50 mg Oral Daily Nada Boozer, NP      . metoprolol (LOPRESSOR) injection 5 mg  5 mg Intravenous STAT Nada Boozer, NP   5 mg at 09/19/11 1647  . metoprolol tartrate (LOPRESSOR) tablet 25 mg  25 mg Oral BID Nada Boozer, NP   25 mg at 09/19/11 2114  . morphine 4 MG/ML injection 4 mg  4 mg Intravenous Once [employer]. Pickering, MD   4 mg at 09/19/11 1314  . omega-3 acid ethyl esters (LOVAZA) capsule 1 g  1 g Oral Daily Hollice Espy, MD      . ondansetron [facility]) tablet 4 mg  4 mg Oral Q6H PRN Hollice Espy, MD       Or  . ondansetron (ZOFRAN) injection 4 mg  4 mg Intravenous Q6H PRN Hollice Espy, MD      . pantoprazole (PROTONIX) EC tablet 40 mg  40 mg Oral Q1200 Nada Boozer, NP      . prochlorperazine (COMPAZINE) injection 10 mg  10 mg Intravenous Once Nada Boozer, NP    10 mg at 09/19/11 2121  . psyllium (HYDROCIL/METAMUCIL) packet 1 packet  1 packet Oral Daily Nada Boozer, NP      . sodium chloride 0.9 % injection 3 mL  3 mL Intravenous Q12H Hollice Espy, MD      . Travoprost (BAK Free) (TRAVATAN) 0.004 % ophthalmic solution SOLN 1 drop  1 drop Both Eyes QHS Nada Boozer, NP   1 drop at 09/19/11 2115  . DISCONTD: ALIGN CAPS 1 capsule  1 capsule Oral Daily Nada Boozer, NP      . DISCONTD: OMEGA 3 CAPS 1,000 mg  1 capsule Oral Daily Nada Boozer, NP        Physical Exam: General appearance: alert and no distress Neck: no adenopathy, no carotid bruit, no JVD, supple, symmetrical, trachea midline and thyroid not enlarged, symmetric, no tenderness/mass/nodules Lungs: clear to auscultation bilaterally Heart: regular rate and rhythm, S1, S2 normal, no murmur, click, rub or gallop Abdomen: soft, non-tender; bowel sounds normal; no masses,  no organomegaly and morbidly obese Extremities: extremities normal, atraumatic, no cyanosis or edema Pulses: 2+ and symmetric Skin: Skin color, texture, turgor normal. No rashes or lesions Neurologic: Alert and oriented X 3, normal strength and tone. Normal symmetric reflexes. Normal coordination and gait Cranial nerves: normal  Lab Results: Results for orders placed during the hospital encounter of 09/19/11 (from the past 48 hour(s))  PROTIME-INR     Status: Normal   Collection Time   09/19/11 11:50 AM      Component Value Range Comment   Prothrombin Time 13.4  11.6 - 15.2 seconds    INR 1.00  0.00 - 1.49   APTT     Status: Normal   Collection Time   09/19/11 11:50 AM      Component Value Range Comment   aPTT 28  24 - 37 seconds   CBC     Status: Abnormal   Collection Time   09/19/11 11:50 AM      Component Value Range Comment   WBC 4.8  4.0 - 10.5 K/uL    RBC 4.94  3.87 - 5.11 MIL/uL    Hemoglobin 12.9  12.0 - 15.0 g/dL  HCT 40.1  36.0 - 46.0 %    MCV 81.2  78.0 - 100.0 fL    MCH 26.1  26.0 - 34.0 pg     MCHC 32.2  30.0 - 36.0 g/dL    RDW 19.1 (*) 47.8 - 15.5 %    Platelets 195  150 - 400 K/uL   DIFFERENTIAL     Status: Normal   Collection Time   09/19/11 11:50 AM      Component Value Range Comment   Neutrophils Relative 66  43 - 77 %    Neutro Abs 3.2  1.7 - 7.7 K/uL    Lymphocytes Relative 24  12 - 46 %    Lymphs Abs 1.1  0.7 - 4.0 K/uL    Monocytes Relative 8  3 - 12 %    Monocytes Absolute 0.4  0.1 - 1.0 K/uL    Eosinophils Relative 2  0 - 5 %    Eosinophils Absolute 0.1  0.0 - 0.7 K/uL    Basophils Relative 0  0 - 1 %    Basophils Absolute 0.0  0.0 - 0.1 K/uL   COMPREHENSIVE METABOLIC PANEL     Status: Abnormal   Collection Time   09/19/11 11:50 AM      Component Value Range Comment   Sodium 141  135 - 145 mEq/L    Potassium 3.8  3.5 - 5.1 mEq/L    Chloride 101  96 - 112 mEq/L    CO2 28  19 - 32 mEq/L    Glucose, Bld 163 (*) 70 - 99 mg/dL    BUN 16  6 - 23 mg/dL    Creatinine, Ser 2.95  0.50 - 1.10 mg/dL    Calcium 62.1  8.4 - 10.5 mg/dL    Total Protein 7.4  6.0 - 8.3 g/dL    Albumin 3.8  3.5 - 5.2 g/dL    AST 16  0 - 37 U/L    ALT 9  0 - 35 U/L    Alkaline Phosphatase 86  39 - 117 U/L    Total Bilirubin 0.8  0.3 - 1.2 mg/dL    GFR calc non Af Amer 71 (*) >90 mL/min    GFR calc Af Amer 82 (*) >90 mL/min   CK TOTAL AND CKMB     Status: Normal   Collection Time   09/19/11 11:50 AM      Component Value Range Comment   Total CK 167  7 - 177 U/L    CK, MB 2.9  0.3 - 4.0 ng/mL    Relative Index 1.7  0.0 - 2.5   TROPONIN I     Status: Normal   Collection Time   09/19/11 11:50 AM      Component Value Range Comment   Troponin I <0.30  <0.30 ng/mL   POCT I-STAT, CHEM 8     Status: Abnormal   Collection Time   09/19/11 11:57 AM      Component Value Range Comment   Sodium 140  135 - 145 mEq/L    Potassium 3.8  3.5 - 5.1 mEq/L    Chloride 102  96 - 112 mEq/L    BUN 16  6 - 23 mg/dL    Creatinine, Ser 3.08  0.50 - 1.10 mg/dL    Glucose, Bld 657 (*) 70 - 99 mg/dL    Calcium,  Ion 8.46  1.13 - 1.30 mmol/L    TCO2 26  0 - 100 mmol/L  Hemoglobin 13.9  12.0 - 15.0 g/dL    HCT 40.9  81.1 - 91.4 %   GLUCOSE, CAPILLARY     Status: Abnormal   Collection Time   09/19/11  4:03 PM      Component Value Range Comment   Glucose-Capillary 105 (*) 70 - 99 mg/dL    Comment 1 Notify RN     MRSA PCR SCREENING     Status: Normal   Collection Time   09/19/11  7:10 PM      Component Value Range Comment   MRSA by PCR NEGATIVE  NEGATIVE   SEDIMENTATION RATE     Status: Abnormal   Collection Time   09/19/11  7:25 PM      Component Value Range Comment   Sed Rate 32 (*) 0 - 22 mm/hr   TSH     Status: Normal   Collection Time   09/19/11  7:25 PM      Component Value Range Comment   TSH 0.815  0.350 - 4.500 uIU/mL   TROPONIN I     Status: Normal   Collection Time   09/19/11  7:26 PM      Component Value Range Comment   Troponin I <0.30  <0.30 ng/mL   URINALYSIS, ROUTINE W REFLEX MICROSCOPIC     Status: Abnormal   Collection Time   09/19/11  8:17 PM      Component Value Range Comment   Color, Urine YELLOW  YELLOW    APPearance CLEAR  CLEAR    Specific Gravity, Urine 1.014  1.005 - 1.030    pH 7.5  5.0 - 8.0    Glucose, UA NEGATIVE  NEGATIVE mg/dL    Hgb urine dipstick TRACE (*) NEGATIVE    Bilirubin Urine NEGATIVE  NEGATIVE    Ketones, ur NEGATIVE  NEGATIVE mg/dL    Protein, ur 30 (*) NEGATIVE mg/dL    Urobilinogen, UA 1.0  0.0 - 1.0 mg/dL    Nitrite POSITIVE (*) NEGATIVE    Leukocytes, UA TRACE (*) NEGATIVE   URINE MICROSCOPIC-ADD ON     Status: Abnormal   Collection Time   09/19/11  8:17 PM      Component Value Range Comment   Squamous Epithelial / LPF FEW (*) RARE    WBC, UA 7-10  <3 WBC/hpf    RBC / HPF 0-2  <3 RBC/hpf    Bacteria, UA MANY (*) RARE   CBC     Status: Abnormal   Collection Time   09/19/11  9:00 PM      Component Value Range Comment   WBC 6.9  4.0 - 10.5 K/uL    RBC 4.86  3.87 - 5.11 MIL/uL    Hemoglobin 12.7  12.0 - 15.0 g/dL    HCT 78.2  95.6 - 21.3 %     MCV 80.5  78.0 - 100.0 fL    MCH 26.1  26.0 - 34.0 pg    MCHC 32.5  30.0 - 36.0 g/dL    RDW 08.6 (*) 57.8 - 15.5 %    Platelets 194  150 - 400 K/uL   CREATININE, SERUM     Status: Normal   Collection Time   09/19/11  9:00 PM      Component Value Range Comment   Creatinine, Ser 0.66  0.50 - 1.10 mg/dL    GFR calc non Af Amer >90  >90 mL/min    GFR calc Af Amer >90  >90 mL/min   GLUCOSE,  CAPILLARY     Status: Abnormal   Collection Time   09/19/11  9:22 PM      Component Value Range Comment   Glucose-Capillary 183 (*) 70 - 99 mg/dL   TROPONIN I     Status: Normal   Collection Time   09/20/11 12:07 AM      Component Value Range Comment   Troponin I <0.30  <0.30 ng/mL   TROPONIN I     Status: Normal   Collection Time   09/20/11  6:00 AM      Component Value Range Comment   Troponin I <0.30  <0.30 ng/mL   GLUCOSE, CAPILLARY     Status: Abnormal   Collection Time   09/20/11  7:44 AM      Component Value Range Comment   Glucose-Capillary 118 (*) 70 - 99 mg/dL    Comment 1 Notify RN       Imaging: Ct Head Wo Contrast  09/19/2011  *RADIOLOGY REPORT*  Clinical Data: No evidence for dense MCA.  . Code stroke.  Speaking difficulty.  CT HEAD WITHOUT CONTRAST  Technique:  Contiguous axial images were obtained from the base of the skull through the vertex without contrast.  Comparison: None.  Findings: There is no evidence for acute hemorrhage, hydrocephalus, mass lesion, or abnormal extra-axial fluid collection.  No definite CT evidence for acute infarction.  Patchy low attenuation in the deep hemispheric and periventricular white matter is nonspecific, but likely reflects chronic microvascular ischemic demyelination. The visualized paranasal sinuses and mastoid air cells are clear  IMPRESSION: No acute intracranial abnormality.  Chronic microvascular ischemic white matter demyelination.  I personally called the results of the study to the physician on call for the stroke team at pager [phone].  This  call was made at to 11:57 a.m. on 09/19/2011.   Original Report Authenticated By: ERIC A. MANSELL, M.D.    Mr Brain Wo Contrast  09/19/2011  *RADIOLOGY REPORT*  Clinical Data: 66 year old female Code stroke, abnormal speech and stuttering. Severe headache.  MRI HEAD WITHOUT CONTRAST  Technique:  Multiplanar, multiecho pulse sequences of the brain and surrounding structures were obtained according to standard protocol without intravenous contrast.  Comparison: Head CT without contrast 1150 hours the same day.  Findings: Mildly heterogeneous diffusion signal in the brainstem, but No restricted diffusion to suggest acute infarction.  Major intracranial vascular flow voids are preserved.  Partially empty sella configuration.  No ventriculomegaly. No midline shift, mass effect, or evidence of mass lesion.  No acute intracranial hemorrhage identified.     Cervicomedullary junction is within normal limits.  Mild degenerative ligamentous hypertrophy about the odontoid, otherwise negative visualized cervical spine.  Mild scattered cerebral and brain stem white matter T2 and FLAIR hyperintensity.  Deep gray matter nuclei and cerebellum are within normal limits.  Postoperative changes to the right globe.  Minimal paranasal sinus mucosal thickening.  Normal bone marrow signal.  Negative scalp soft tissues.  IMPRESSION: 1. No acute intracranial abnormality. 2.  Mild for age nonspecific white matter signal changes, most commonly due to small vessel disease.   Original Report Authenticated By: Harley Hallmark, M.D.     Assessment:  1. Principal Problem: 2.  *SVT (supraventricular tachycardia) 3. Active Problems: 4.  DIABETES MELLITUS 5.  OBESITY 6.  DEPRESSION 7.  HYPERTENSION 8.  SLEEP APNEA 9.  Anemia 10.  Aphagia 11.  Migraine headache with aura 12.   Plan:  1. She is feeling much better today. No SVT overnight.  On metoprolol in sinus with HR controlled. Can probably go to telemetry today. PT evaluation for  weakness with ambulation. Plan echo today with bubble contrast study. No focal deficits today on neurologic exam.  Time Spent Directly with Patient:  15 minutes  Length of Stay:  LOS: 1 day   Chrystie Nose, MD, [facility] Attending Cardiologist [facility]  Madelynn Malson C 09/20/2011, 8:55 AM

## 2011-09-20 NOTE — Discharge Summary (Signed)
Physician Discharge Summary  Jade Ellison JWJ:[id number] DOB: Nov 07, 1945 DOA: 09/19/2011  PCP: Maryelizabeth Rowan, MD  Admit date: 09/19/2011 Discharge date: 09/20/2011  Recommendations for Outpatient Follow-up:  1. Patient will followup with her cardiologist office in the next week or 2 for results of echo/bubble study 2. Patient will followup with her primary care physician in the next month as needed.  Discharge Diagnoses:  Principal Problem:  *SVT (supraventricular tachycardia) Active Problems:  DIABETES MELLITUS  OBESITY  DEPRESSION  HYPERTENSION  SLEEP APNEA  Anemia  Aphagia  Migraine headache with aura   Discharge Condition: Improved, being discharged home  Diet recommendation: Heart healthy, carb modified  Filed Weights   09/19/11 1845  Weight: 127.9 kg (281 lb 15.5 oz)    History of present illness:  Patient is a 66 year old African American female past history hypertension, diabetes mellitus and sleep apnea who initially started having episodes of headache and stuttering speech on day of admission. There is a concern for the possibility of strokes the patient with the emergency room for further evaluation. Initial CT scan was negative and neurology found she had no other deficits other than stuttering speech. Speech seemed to improve and the patient was distracted. Neurology felt that this was either related to stress manifestation versus migraine with aura. The plan was to discharge the patient home, but then she is noted to be quite tachycardic on telemetry and an EKG noted SVT. Patient was admitted for this.  Hospital Course:  SVT: Patient initially was placed in step down unit for use of chronotropic drip agents. She was given doses of IV Lopressor and when this did not improve, she was started on continuous IV Cardizem. Cardiology followed with the patient and by hospital day 2, she is able to be weaned off the Cardizem drip and started on by mouth Lopressor which she  tolerated. Patient was felt to be stable and underwent a 2-D echo with bubble study. Results of this are pending and the patient will followup with her cardiologist in his office for results.  Speech disturbance and headache: Felt to be more stress related versus migraine with aura. Patient was given a dose of Ativan after she was admitted and slept quite comfortably. When she was first admitted, she told me that she was having severe stress at home because she was helping to care for her granddaughter while her daughter worked. She felt that this was too much of a burden for her she felt quite stressed. Patient was quite tearful and agitated. I started her on when necessary Xanax 0.25 twice a day in the hospital day 2, the patient said she felt like a new self and she said that she felt much better still going to give her a prescription for Xanax when necessary.  Hypertension: Patient did well. Blood pressures were stable on her home medicines plus by mouth Lopressor. She couldn't get his prescription for this.  Diabetes mellitus: Stable with home medications plus sliding scale.  Obstructive sleep apnea: Stable issue.  Procedures:  Two-dimensional echo with bubble study: Results are pending  CT scan of the head done 9/6: No acute findings  MRI of the brain done 9/6: No acute findings including negative for CVA  Consultations:  Cardiology-[facility]  Neurology-[facility]patrick  Discharge Exam: Filed Vitals:   09/20/11 0720 09/20/11 0745 09/20/11 1050 09/20/11 1215  BP: 127/65  117/51 130/82  Pulse:    66  Temp:  98.1 F (36.7 C)  97.9 F (36.6 C)  TempSrc:  Oral  Oral  Resp:  18  20  Height:    5\' 3"  (1.6 m)  Weight:      SpO2:  96%  98%    General: Alert and oriented x3, no acute distress, doing much better HEENT: Normocephalic, atraumatic, mucous hemorrhage are moist Cardiovascular: Regular rate and rhythm, S1-S2 Respiratory: Clear to auscultation bilaterally Abdomen: Soft, obese,  nontender, positive bowel sounds Extremities: No clubbing or cyanosis, trace pitting edema  Discharge Instructions  Discharge Orders    Future Appointments: Provider: Department: Dept Phone: Center:   10/08/2011 11:30 AM Windell Hummingbird [facility] [phone] None   10/08/2011 12:00 PM Benjiman Core, MD [facility] [phone] None     Future Orders Please Complete By Expires   Diet - low sodium heart healthy      Diet Carb Modified      Increase activity slowly        Medication List  As of 09/20/2011  3:19 PM   STOP taking these medications         MULTIVITAL PO         TAKE these medications         ALIGN 4 MG Caps   Take 1 capsule by mouth daily.      ALPRAZolam 0.25 MG tablet   Commonly known as: XANAX   Take 1 tablet (0.25 mg total) by mouth 2 (two) times daily as needed for anxiety.      amLODipine 5 MG tablet   Commonly known as: NORVASC   Take 5 mg by mouth daily.      aspirin EC 81 MG tablet   Take 162 mg by mouth daily.      dicyclomine 20 MG tablet   Commonly known as: BENTYL   Take 20 mg by mouth every 6 (six) hours.      ferrous sulfate 325 (65 FE) MG tablet   Take 325 mg by mouth 3 (three) times daily.      HYDROcodone-acetaminophen 7.5-325 MG per tablet   Commonly known as: NORCO   Take 1 tablet by mouth every 8 (eight) hours as needed for pain.      losartan 50 MG tablet   Commonly known as: COZAAR   Take 50 mg by mouth daily.      METAMUCIL 28.3 % Powd   Generic drug: Psyllium   Takes 2 tsp in 8 ounces of water once a day      metFORMIN 1000 MG tablet   Commonly known as: GLUCOPHAGE   Take 1,000 mg by mouth 2 (two) times daily with a meal.      metoprolol tartrate 25 MG tablet   Commonly known as: LOPRESSOR   Take 1 tablet (25 mg total) by mouth 2 (two) times daily.      MYRBETRIQ 50 MG Tb24   Generic drug: mirabegron ER   Take 50 mg by mouth daily.      OMEGA 3 1000 MG Caps   Take 1 capsule by mouth daily.       omeprazole 20 MG capsule   Commonly known as: PRILOSEC   Take 20 mg by mouth daily.      rosuvastatin 20 MG tablet   Commonly known as: CRESTOR   Take 20 mg by mouth daily.      Travoprost (BAK Free) 0.004 % Soln ophthalmic solution   Commonly known as: TRAVATAN   Place 1 drop into both eyes at bedtime.  Follow-up Information    Follow up with Runell Gess, MD. (Dr. Hazle Coca office will call with date and time for Echo and follow up with Dr. Allyson Sabal.)    Contact information:   [address] [place] [address] [phone]       Follow up with [facility], MD in 1 month. (As needed)    Contact information:   [address] [place] [address] [phone]           The results of significant diagnostics from this hospitalization (including imaging, microbiology, ancillary and laboratory) are listed below for reference.    Significant Diagnostic Studies: Ct Head Wo Contrast  09/19/2011    IMPRESSION: No acute intracranial abnormality.  Chronic microvascular ischemic white matter demyelination.  I personally called the results of the study to the physician on call for the stroke team at pager [phone].  This call was made at to 11:57 a.m. on 09/19/2011.   Original Report Authenticated By: ERIC A. MANSELL, M.D.    Mr Brain Wo Contrast  09/19/2011   IMPRESSION: 1. No acute intracranial abnormality. 2.  Mild for age nonspecific white matter signal changes, most commonly due to small vessel disease.   Original Report Authenticated By: Harley Hallmark, M.D.     Microbiology: Recent Results (from the past 240 hour(s))  MRSA PCR SCREENING     Status: Normal   Collection Time   09/19/11  7:10 PM      Component Value Range Status Comment   MRSA by PCR NEGATIVE  NEGATIVE Final      Labs: Basic Metabolic Panel:  Lab 09/19/11 9518 09/19/11 1157 09/19/11 1150  NA -- 140 141  K -- 3.8 3.8  CL -- 102 101    CO2 -- -- 28  GLUCOSE -- 164* 163*  BUN -- 16 16  CREATININE 0.66 0.90 0.84  CALCIUM -- -- 10.1  MG -- -- --  PHOS -- -- --   Liver Function Tests:  Lab 09/19/11 1150  AST 16  ALT 9  ALKPHOS 86  BILITOT 0.8  PROT 7.4  ALBUMIN 3.8   CBC:  Lab 09/19/11 2100 09/19/11 1157 09/19/11 1150  WBC 6.9 -- 4.8  NEUTROABS -- -- 3.2  HGB 12.7 13.9 12.9  HCT 39.1 41.0 40.1  MCV 80.5 -- 81.2  PLT 194 -- 195   Cardiac Enzymes:  Lab 09/20/11 0600 09/20/11 0007 09/19/11 1926 09/19/11 1150  CKTOTAL -- -- -- 167  CKMB -- -- -- 2.9  CKMBINDEX -- -- -- --  TROPONINI <0.30 <0.30 <0.30 <0.30   CBG:  Lab 09/20/11 1216 09/20/11 0744 09/19/11 2122 09/19/11 1603  GLUCAP 134* 118* 183* 105*    Time coordinating discharge: 30 minutes  Signed:  Hollice Espy  [facility] 09/20/2011, 3:19 PM

## 2011-10-01 ENCOUNTER — Other Ambulatory Visit: Payer: Self-pay | Admitting: Physical Medicine & Rehabilitation

## 2011-10-02 ENCOUNTER — Telehealth: Payer: Self-pay | Admitting: Physical Medicine & Rehabilitation

## 2011-10-02 MED ORDER — HYDROCODONE-ACETAMINOPHEN 7.5-325 MG PO TABS: 1.0000 | ORAL_TABLET | Freq: Three times a day (TID) | ORAL | Status: DC | PRN

## 2011-10-02 NOTE — Telephone Encounter (Signed)
Refill on Hydrocodone.  Homebound now.  Can Dr call in Rx?

## 2011-10-07 ENCOUNTER — Telehealth: Payer: Self-pay | Admitting: Oncology
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
## 2011-10-08 ENCOUNTER — Other Ambulatory Visit: Payer: Medicare Other | Admitting: Lab

## 2011-10-08 ENCOUNTER — Ambulatory Visit: Payer: Medicare Other | Admitting: Oncology

## 2011-10-29 ENCOUNTER — Other Ambulatory Visit: Payer: Self-pay | Admitting: Physical Medicine & Rehabilitation

## 2011-10-31 ENCOUNTER — Other Ambulatory Visit (HOSPITAL_COMMUNITY): Payer: Self-pay | Admitting: Internal Medicine

## 2011-10-31 ENCOUNTER — Other Ambulatory Visit: Payer: Self-pay | Admitting: Physical Medicine & Rehabilitation

## 2011-10-31 ENCOUNTER — Telehealth: Payer: Self-pay | Admitting: *Deleted

## 2011-10-31 NOTE — Telephone Encounter (Signed)
error 

## 2011-11-05 ENCOUNTER — Telehealth: Payer: Self-pay | Admitting: Oncology

## 2011-11-05 NOTE — Telephone Encounter (Signed)
Pt needed to reschedule missed appt.....schedule lab and est for 10.31.13.

## 2011-11-12 ENCOUNTER — Telehealth: Payer: Self-pay | Admitting: Oncology

## 2011-11-12 NOTE — Telephone Encounter (Signed)
Pt needed and after 2:00pm appt....rescheduled to 3:30 lab 4:00 est

## 2011-11-13 ENCOUNTER — Telehealth: Payer: Self-pay | Admitting: Oncology

## 2011-11-13 ENCOUNTER — Other Ambulatory Visit: Payer: Medicare Other | Admitting: Lab

## 2011-11-13 ENCOUNTER — Other Ambulatory Visit: Payer: Medicare Other

## 2011-11-13 ENCOUNTER — Ambulatory Visit: Payer: Medicare Other | Admitting: Oncology

## 2011-11-13 NOTE — Telephone Encounter (Signed)
Pt called to reschedule appts due to daughter work schedule she could not make todays appt.

## 2011-11-18 ENCOUNTER — Other Ambulatory Visit: Payer: Self-pay | Admitting: Internal Medicine

## 2011-11-28 ENCOUNTER — Other Ambulatory Visit: Payer: Self-pay | Admitting: *Deleted

## 2011-11-28 MED ORDER — DICYCLOMINE HCL 20 MG PO TABS: 20.0000 mg | ORAL_TABLET | Freq: Two times a day (BID) | ORAL | Status: DC

## 2011-12-09 ENCOUNTER — Other Ambulatory Visit: Payer: Medicare Other | Admitting: Lab

## 2011-12-09 ENCOUNTER — Ambulatory Visit: Payer: Medicare Other | Admitting: Oncology

## 2011-12-15 ENCOUNTER — Telehealth: Payer: Self-pay | Admitting: Oncology

## 2011-12-15 NOTE — Telephone Encounter (Signed)
pt call and need to reschedule missed appt....DONE

## 2011-12-17 ENCOUNTER — Telehealth: Payer: Self-pay | Admitting: Oncology

## 2011-12-17 ENCOUNTER — Ambulatory Visit (HOSPITAL_BASED_OUTPATIENT_CLINIC_OR_DEPARTMENT_OTHER): Payer: Medicare Other | Admitting: Oncology

## 2011-12-17 ENCOUNTER — Telehealth: Payer: Self-pay | Admitting: *Deleted

## 2011-12-17 ENCOUNTER — Other Ambulatory Visit (HOSPITAL_BASED_OUTPATIENT_CLINIC_OR_DEPARTMENT_OTHER): Payer: Medicare Other | Admitting: Lab

## 2011-12-17 VITALS — BP 165/89 | HR 68 | Temp 98.0°F | Resp 20

## 2011-12-17 DIAGNOSIS — D649 Anemia, unspecified: Secondary | ICD-10-CM

## 2011-12-17 DIAGNOSIS — D509 Iron deficiency anemia, unspecified: Secondary | ICD-10-CM

## 2011-12-17 LAB — CBC WITH DIFFERENTIAL/PLATELET
BASO%: 0.4 % (ref 0.0–2.0)
Eosinophils Absolute: 0.1 10*3/uL (ref 0.0–0.5)
HCT: 42 % (ref 34.8–46.6)
LYMPH%: 20.2 % (ref 14.0–49.7)
MONO#: 0.5 10*3/uL (ref 0.1–0.9)
NEUT#: 3.9 10*3/uL (ref 1.5–6.5)
NEUT%: 70.1 % (ref 38.4–76.8)
Platelets: 164 10*3/uL (ref 145–400)
RBC: 5.06 10*6/uL (ref 3.70–5.45)
WBC: 5.5 10*3/uL (ref 3.9–10.3)
lymph#: 1.1 10*3/uL (ref 0.9–3.3)

## 2011-12-17 LAB — FERRITIN: Ferritin: 228 ng/mL (ref 10–291)

## 2011-12-17 MED ORDER — HYDROCODONE-ACETAMINOPHEN 5-500 MG PO TABS: 1.0000 | ORAL_TABLET | Freq: Three times a day (TID) | ORAL | Status: DC | PRN

## 2011-12-17 NOTE — Telephone Encounter (Signed)
appts made and printed for pt aom °

## 2011-12-17 NOTE — Telephone Encounter (Signed)
ok 

## 2011-12-17 NOTE — Telephone Encounter (Signed)
Patient is requesting refill on Hydrocodone/Acetaminophen 5-500mg  one po q 8 hrs prn Disp: 75. Please advise if ok to fill.

## 2011-12-17 NOTE — Progress Notes (Signed)
Hematology and Oncology Follow Up Visit  Jade Ellison [id number] 1945-12-24 66 y.o. 12/17/2011 3:45 PM Jade Ellison, MDDewey, Jade Manis, MD   Principle Diagnosis: Iron deficiency anemia  Current therapy: Ferrous sulfate 325 mg PO TID. She was given iron dextran 1300 mg IV on 07/01/11 at her GI office   Interim History:  [name] is a 66 year old female seen for follow-up today. She is doing well since her last visit, she is still taking iron three times a day. Reports fatigue is much better. No chest pain, shortness of breath, dyspnea. No abdominal pain, nausea, or vomiting. Reports that she has belching at times. She has not noticed any bleeding recently.  Medications: I have reviewed the patient's current medications. Current outpatient prescriptions:amLODipine (NORVASC) 5 MG tablet, Take 5 mg by mouth daily.  , Disp: , Rfl: ;  aspirin EC 81 MG tablet, Take 162 mg by mouth daily., Disp: , Rfl: ;  dicyclomine (BENTYL) 20 MG tablet, Take 1 tablet (20 mg total) by mouth 2 (two) times daily., Disp: 60 tablet, Rfl: 2;  ferrous sulfate 325 (65 FE) MG tablet, Take 325 mg by mouth 3 (three) times daily., Disp: , Rfl:  HYDROcodone-acetaminophen (VICODIN) 5-500 MG per tablet, Take 1 tablet by mouth every 8 (eight) hours as needed for pain., Disp: 75 tablet, Rfl: 0;  losartan (COZAAR) 50 MG tablet, Take 50 mg by mouth daily., Disp: , Rfl: ;  metFORMIN (GLUCOPHAGE) 1000 MG tablet, Take 1,000 mg by mouth 2 (two) times daily with a meal.  , Disp: , Rfl:  metoprolol tartrate (LOPRESSOR) 25 MG tablet, Take 1 tablet (25 mg total) by mouth 2 (two) times daily., Disp: 60 tablet, Rfl: 0;  mirabegron ER (MYRBETRIQ) 50 MG TB24, Take 50 mg by mouth daily., Disp: , Rfl: ;  OMEGA 3 1000 MG CAPS, Take 1 capsule by mouth daily.  , Disp: , Rfl: ;  omeprazole (PRILOSEC) 20 MG capsule, TAKE 1 CAPSULE BY MOUTH DAILY, Disp: 30 capsule, Rfl: 0 Probiotic Product (ALIGN) 4 MG CAPS, Take 1 capsule by mouth daily., Disp: , Rfl: ;   Psyllium (METAMUCIL) 28.3 % POWD, Takes 2 tsp in 8 ounces of water once a day, Disp: , Rfl: ;  rosuvastatin (CRESTOR) 20 MG tablet, Take 20 mg by mouth daily.  , Disp: , Rfl: ;  Travoprost, BAK Free, (TRAVATAMN) 0.004 % SOLN ophthalmic solution, Place 1 drop into both eyes at bedtime. , Disp: , Rfl:   Allergies:  Allergies  Allergen Reactions  . Codeine Rash  . Nsaids     Abdominal Pain    Past Medical History, Surgical history, Social history, and Family History were reviewed and updated.  Review of Systems: Constitutional:  Negative for fever, chills, night sweats, anorexia, weight loss, pain. Cardiovascular: no chest pain or dyspnea on exertion Respiratory: no cough, shortness of breath, or wheezing Neurological: no TIA or stroke symptoms Dermatological: negative ENT: negative Skin: Negative. Gastrointestinal: no abdominal pain, change in bowel habits, or black or bloody stools Genito-Urinary: no dysuria, trouble voiding, or hematuria Hematological and Lymphatic: negative Breast: negative for breast lumps Musculoskeletal: negative Remaining ROS negative.  Physical Exam: Blood pressure 165/89, pulse 68, temperature 98 F (36.7 C), temperature source Oral, resp. rate 20. ECOG: 1-2 General appearance: alert, cooperative and no distress Head: Normocephalic, without obvious abnormality, atraumatic Neck: no adenopathy, no carotid bruit, no JVD, supple, symmetrical, trachea midline and thyroid not enlarged, symmetric, no tenderness/mass/nodules Lymph nodes: Cervical, supraclavicular, and axillary nodes normal. Heart:regular  rate and rhythm, S1, S2 normal, no murmur, click, rub or gallop Lung:chest clear, no wheezing, rales, normal symmetric air entry, no tachypnea, retractions or cyanosis Abdomen: soft, non-tender, without masses or organomegaly EXT:no erythema, induration, or nodules  Lab Results: Lab Results  Component Value Date   WBC 5.5 12/17/2011   HGB 13.5 12/17/2011    HCT 42.0 12/17/2011   MCV 83.1 12/17/2011   PLT 164 12/17/2011     Chemistry      Component Value Date/Time   NA 140 09/19/2011 1157   K 3.8 09/19/2011 1157   CL 102 09/19/2011 1157   CO2 28 09/19/2011 1150   BUN 16 09/19/2011 1157   CREATININE 0.66 09/19/2011 2100      Component Value Date/Time   CALCIUM 10.1 09/19/2011 1150   ALKPHOS 86 09/19/2011 1150   AST 16 09/19/2011 1150   ALT 9 09/19/2011 1150   BILITOT 0.8 09/19/2011 1150     Impression and Plan: This is a 66 year old female with the following issues: 1. Iron deficiency anemia. GI work-up in progress due to heme positive stools. Hemoglobin has now corrected with oral iron and IV iron. Recommend that she continue oral iron once a day. Will continue to monitor her labs. She may need additional IV iron in the future. 2. DM. Continue Metformin and Actos per PCP. 3. Hyperlipidemia. Continue Crestor per PCP. 4. HTN. Continue Norvasc, Lotensin, Lasix, and Benicar per PCP. 5. Follow-up. In about 6 months to monitor labs.     Carley Strickling 12/4/20133:45 PM

## 2011-12-17 NOTE — Telephone Encounter (Signed)
Prescription was verbally called into [facility] pharmacy.

## 2011-12-22 ENCOUNTER — Other Ambulatory Visit: Payer: Self-pay | Admitting: *Deleted

## 2011-12-22 MED ORDER — OMEPRAZOLE 20 MG PO CPDR: 20.0000 mg | DELAYED_RELEASE_CAPSULE | Freq: Every day | ORAL | Status: DC

## 2011-12-30 ENCOUNTER — Encounter: Payer: Self-pay | Admitting: Internal Medicine

## 2011-12-30 ENCOUNTER — Other Ambulatory Visit (INDEPENDENT_AMBULATORY_CARE_PROVIDER_SITE_OTHER): Payer: Medicare Other

## 2011-12-30 ENCOUNTER — Ambulatory Visit (INDEPENDENT_AMBULATORY_CARE_PROVIDER_SITE_OTHER): Payer: Medicare Other | Admitting: Internal Medicine

## 2011-12-30 VITALS — BP 156/82 | HR 68 | Ht 63.0 in | Wt 273.6 lb

## 2011-12-30 DIAGNOSIS — D509 Iron deficiency anemia, unspecified: Secondary | ICD-10-CM

## 2011-12-30 DIAGNOSIS — K31811 Angiodysplasia of stomach and duodenum with bleeding: Secondary | ICD-10-CM

## 2011-12-30 LAB — CBC WITH DIFFERENTIAL/PLATELET
Basophils Absolute: 0 10*3/uL (ref 0.0–0.1)
Eosinophils Absolute: 0.1 10*3/uL (ref 0.0–0.7)
Hemoglobin: 13.9 g/dL (ref 12.0–15.0)
Lymphocytes Relative: 19.8 % (ref 12.0–46.0)
Lymphs Abs: 1.3 10*3/uL (ref 0.7–4.0)
MCHC: 32.4 g/dL (ref 30.0–36.0)
Monocytes Relative: 7.6 % (ref 3.0–12.0)
Neutro Abs: 4.6 10*3/uL (ref 1.4–7.7)
Platelets: 199 10*3/uL (ref 150.0–400.0)
RDW: 14.7 % — ABNORMAL HIGH (ref 11.5–14.6)

## 2011-12-30 LAB — IBC PANEL
Iron: 63 ug/dL (ref 42–145)
Saturation Ratios: 19.5 % — ABNORMAL LOW (ref 20.0–50.0)

## 2011-12-30 NOTE — Patient Instructions (Addendum)
You have been scheduled for an enteroscopy with [facility]. Please follow written instructions given to you at your visit today. If you use inhalers (even only as needed) or a CPAP machine, please bring them with you on the day of your procedure.  Your physician has requested that you go to the basement for the following lab work before leaving today: IBC, CBC  You may eat yogurt instead of taking Align.  CC: Dr Clelia Croft, Dr Duanne Guess

## 2011-12-30 NOTE — Progress Notes (Signed)
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 Her upper endoscopy in October 2012 showed gastritis and Barrett's esophagus. Her colonoscopy was negative. Her initial hemoglobin was 9.4 with an MCV of 69. She received an iron infusion and has been on iron supplements with good response in her hemoglobin which came up to 13.5 and most recently 12.7 but her MCV is still low at 74. She denies abdominal pain, melena, nausea or dysphagia. Her level of energy has improved.   Past Medical History  Diagnosis Date  . Colon polyp     polypoid colorectal mucosa  . Diverticulosis   . Depression   . Nephrolithiasis   . Sleep apnea   . Hypertension   . Obesity   . Diabetes mellitus   . Hemorrhoids   . Hernia of unspecified site of abdominal cavity without mention of obstruction or gangrene   . Arthritis   . Anemia   . Gastritis   . Esophagitis   . Barrett esophagus 10/14/10   Past Surgical History  Procedure Date  . Left sided lithotripsy   . Cholecystectomy   . Knee arthroscopy   . Cardiac catheterization   . Hernia repair   . Tubal ligation   . Cesarean section 1986    reports that she has never smoked. She has never used smokeless tobacco. She reports that she drinks about .6 ounces of alcohol per week. She reports that she does not use illicit drugs. family history includes Diabetes in her brother and sister; Heart disease in her father and mother; and Kidney cancer in her brother. Allergies  Allergen Reactions  . Codeine Rash  . Nsaids     Abdominal Pain        Review of Systems: Negative for dysphagia odynophagia. Weight stable  The remainder of the 10 point ROS is negative except as outlined in H&P   Physical Exam: General appearance  Well developed, in no distress.. Skin no lesions. Neurological alert and  oriented x 3. Psychological normal mood and affect.  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 She may possibly have multiple AVMs in the small bowel which were not visualized on the small bowel capsule endoscopy. She also has Barrett's esophagus which needs to be rechecked endoscopically. We will go ahead with upper endoscopy/enteroscopy with plans for argon laser ablation of the AVM if it is within the reach of the enteroscope and we will repeat biopsies for Barrett's. She will remain on Prilosec 20 mg daily. We will recheck her iron studies as well as B12 and sprue profile and hemoglobin today. She will continue iron supplements. She was told to reduce her iron to one a day.   12/30/2011 Jade Ellison

## 2012-01-13 ENCOUNTER — Telehealth: Payer: Self-pay | Admitting: Internal Medicine

## 2012-01-13 NOTE — Telephone Encounter (Signed)
Patient reports that she has a viral illness with vomiting and diarrhea, she also reports that she was started on an antibiotic for chest congestion and fever.  She is weak and doesn't feel she can have the procedure.  She will call back to reschedule.

## 2012-01-13 NOTE — Telephone Encounter (Signed)
Left message for patient to call back  

## 2012-01-15 ENCOUNTER — Encounter (HOSPITAL_COMMUNITY): Admission: RE | Payer: Self-pay | Source: Ambulatory Visit

## 2012-01-15 ENCOUNTER — Ambulatory Visit (HOSPITAL_COMMUNITY): Admission: RE | Admit: 2012-01-15 | Payer: Medicare Other | Source: Ambulatory Visit | Admitting: Internal Medicine

## 2012-01-15 SURGERY — ENTEROSCOPY
Anesthesia: Moderate Sedation

## 2012-01-19 ENCOUNTER — Other Ambulatory Visit: Payer: Self-pay | Admitting: *Deleted

## 2012-01-20 ENCOUNTER — Telehealth: Payer: Self-pay

## 2012-01-20 NOTE — Telephone Encounter (Signed)
Jade Ellison has been getting monthly refills for hydrocodone but has not been seen by you since  11/2010. She did see Rusty in Feb and Alicia Amel in March of 2013 but has cancelled or no showed every appt since.  (last 5 appts) I can tell her no rx without appt but looks like they have been told that in phone messages but set up appts and then get phone refills and cancel.  Please advise.

## 2012-01-20 NOTE — Telephone Encounter (Signed)
Patients daughter called concerned about why her mothers medication was denied.  Patient has bronchitis and relies on her daughter for transportation.  Her daughter says she can bring her in next month but she cannot get her to the office this month.  Please advise.

## 2012-01-21 ENCOUNTER — Telehealth: Payer: Self-pay | Admitting: *Deleted

## 2012-01-21 NOTE — Telephone Encounter (Signed)
No more refills without an in-person visit

## 2012-01-21 NOTE — Telephone Encounter (Signed)
Notified Elvia Collum that we will not refill medication until patient is seen in our office.

## 2012-01-23 ENCOUNTER — Encounter
Payer: Medicare Other | Attending: Physical Medicine and Rehabilitation | Admitting: Physical Medicine and Rehabilitation

## 2012-01-23 ENCOUNTER — Encounter: Payer: Self-pay | Admitting: Physical Medicine and Rehabilitation

## 2012-01-23 VITALS — BP 154/88 | HR 51 | Resp 14 | Ht 61.0 in | Wt 273.0 lb

## 2012-01-23 DIAGNOSIS — K209 Esophagitis, unspecified without bleeding: Secondary | ICD-10-CM | POA: Insufficient documentation

## 2012-01-23 DIAGNOSIS — K3189 Other diseases of stomach and duodenum: Secondary | ICD-10-CM | POA: Insufficient documentation

## 2012-01-23 DIAGNOSIS — M7520 Bicipital tendinitis, unspecified shoulder: Secondary | ICD-10-CM | POA: Insufficient documentation

## 2012-01-23 DIAGNOSIS — M1610 Unilateral primary osteoarthritis, unspecified hip: Secondary | ICD-10-CM | POA: Insufficient documentation

## 2012-01-23 DIAGNOSIS — M1710 Unilateral primary osteoarthritis, unspecified knee: Secondary | ICD-10-CM | POA: Insufficient documentation

## 2012-01-23 DIAGNOSIS — E119 Type 2 diabetes mellitus without complications: Secondary | ICD-10-CM | POA: Insufficient documentation

## 2012-01-23 DIAGNOSIS — M67919 Unspecified disorder of synovium and tendon, unspecified shoulder: Secondary | ICD-10-CM | POA: Insufficient documentation

## 2012-01-23 DIAGNOSIS — M25559 Pain in unspecified hip: Secondary | ICD-10-CM | POA: Insufficient documentation

## 2012-01-23 DIAGNOSIS — M25569 Pain in unspecified knee: Secondary | ICD-10-CM | POA: Insufficient documentation

## 2012-01-23 DIAGNOSIS — M1990 Unspecified osteoarthritis, unspecified site: Secondary | ICD-10-CM

## 2012-01-23 DIAGNOSIS — M719 Bursopathy, unspecified: Secondary | ICD-10-CM | POA: Insufficient documentation

## 2012-01-23 DIAGNOSIS — R209 Unspecified disturbances of skin sensation: Secondary | ICD-10-CM | POA: Insufficient documentation

## 2012-01-23 DIAGNOSIS — G4730 Sleep apnea, unspecified: Secondary | ICD-10-CM | POA: Insufficient documentation

## 2012-01-23 DIAGNOSIS — I10 Essential (primary) hypertension: Secondary | ICD-10-CM | POA: Insufficient documentation

## 2012-01-23 DIAGNOSIS — M25519 Pain in unspecified shoulder: Secondary | ICD-10-CM | POA: Insufficient documentation

## 2012-01-23 DIAGNOSIS — M542 Cervicalgia: Secondary | ICD-10-CM | POA: Insufficient documentation

## 2012-01-23 MED ORDER — HYDROCODONE-ACETAMINOPHEN 7.5-325 MG PO TABS: 1.0000 | ORAL_TABLET | Freq: Three times a day (TID) | ORAL | Status: DC | PRN

## 2012-01-23 NOTE — Patient Instructions (Addendum)
Continue with your exercise program, restart aquatic exercises when the weather get warmer.You could try Arnica cream for your arthritic and muscle pain .

## 2012-01-23 NOTE — Progress Notes (Signed)
Subjective:    Patient ID: Jade Ellison, female    DOB: 02/23/1945, 67 y.o.   MRN: [id number]  HPI The patient is a year old female female, who presents with . The patient complains about moderate severe pain in both knee, left worse, right hip pain, neck, both shoulders.  , which radiate to. Patient also complains about numbness and tingling in  Both hands and feet  . She describes the pain as dull and stabbing intermittendly, depending on active  . Applying heat, taking medications , changing positions alleviate the symptoms. Prolonged standing  aggrevates the symptoms. The patient grades his pain as a  7/10. Pain Inventory Average Pain 8 Pain Right Now 7 My pain is intermittent and dull  In the last 24 hours, has pain interfered with the following? General activity 10 Relation with others 8 Enjoyment of life 6 What TIME of day is your pain at its worst? daytime Sleep (in general) Good  Pain is worse with: walking and some activites Pain improves with: heat/ice and medication Relief from Meds: 7  Mobility walk with assistance use a cane use a walker how many minutes can you walk? 5 ability to climb steps?  no do you drive?  no use a wheelchair Do you have any goals in this area?  yes  Function disabled: date disabled 32 I need assistance with the following:  dressing, bathing, household duties and shopping Do you have any goals in this area?  yes  Neuro/Psych bladder control problems bowel control problems weakness numbness tingling trouble walking depression anxiety  Prior Studies Any changes since last visit?  no  Physicians involved in your care Any changes since last visit?  no   Family History  Problem Relation Age of Onset  . Heart disease Mother   . Heart disease Father   . Diabetes Brother   . Diabetes Sister   . Kidney cancer Brother    History   Social History  . Marital Status: Widowed    Spouse Name: N/A    Number of Children: 1  .  Years of Education: N/A   Occupational History  . RETIRED    Social History Main Topics  . Smoking status: Never Smoker   . Smokeless tobacco: Never Used  . Alcohol Use: 0.6 oz/week    1 Glasses of wine per week     Comment: wine sometimes on holidays and bedtime  . Drug Use: No  . Sexually Active: None   Other Topics Concern  . None   Social History Narrative  . None   Past Surgical History  Procedure Date  . Left sided lithotripsy   . Cholecystectomy   . Knee arthroscopy   . Cardiac catheterization   . Hernia repair   . Tubal ligation   . Cesarean section 1986   Past Medical History  Diagnosis Date  . Colon polyp     polypoid colorectal mucosa  . Diverticulosis   . Depression   . Nephrolithiasis   . Sleep apnea   . Hypertension   . Obesity   . Diabetes mellitus   . Hemorrhoids   . Hernia of unspecified site of abdominal cavity without mention of obstruction or gangrene   . Arthritis   . Anemia   . Gastritis   . Esophagitis   . Barrett esophagus 10/14/10   BP 154/88  Pulse 51  Resp 14  Ht 5\' 1"  (1.549 m)  Wt 273 lb (123.832 kg)  BMI 51.58  kg/m2  SpO2 98%     Review of Systems  Respiratory: Positive for apnea.   Musculoskeletal: Positive for myalgias, arthralgias and gait problem.  Neurological: Positive for weakness and numbness.  Psychiatric/Behavioral: Positive for dysphoric mood. The patient is nervous/anxious.   All other systems reviewed and are negative.       Objective:   Physical Exam Symmetric normal motor tone is noted throughout. Normal muscle bulk. Muscle testing reveals 5/5 muscle strength of the upper extremity, and 5/5 of the lower extremity. Full range of motion in upper extremities, extension deficit 5-10 degrees in the left knee, 10-15 degrees in the right knee, flexion 90-100 degrees in the right knee, 80 degrees in the left. ROM of spine is restricted. Fine motor movements are normal in both hands. DTR in the upper and lower  extremity are present and symmetric 2+. No clonus is noted.  Patient in a wheel chair. .        Assessment & Plan:  1. 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  2. Right rotator cuff syndrome with bicipital tendonitis and bursitis.  3. Morbid obesity.  4. Myofascial cervical pain.  5. Peptic ulcer disease/esophagitis. 6. Arthritis of right hip Patient should continue with the exercises program she learned from PT, and restart her aquatic exercises, when the weather gets a little warmer. Follow up in 3 month.

## 2012-01-26 ENCOUNTER — Telehealth: Payer: Self-pay | Admitting: Internal Medicine

## 2012-01-27 ENCOUNTER — Telehealth: Payer: Self-pay | Admitting: *Deleted

## 2012-01-27 DIAGNOSIS — K922 Gastrointestinal hemorrhage, unspecified: Secondary | ICD-10-CM

## 2012-01-27 NOTE — Telephone Encounter (Signed)
Left a message for patient to call me to reschedule hospital procedure.(enteroscopy with [facility] on a Monday or Thursday per MD)

## 2012-01-27 NOTE — Telephone Encounter (Signed)
It will have to be done on Mondays or Thursdays

## 2012-01-27 NOTE — Telephone Encounter (Signed)
Left a message for patient to call me. 

## 2012-01-27 NOTE — Telephone Encounter (Signed)
Patient is asking to reschedule her enteroscopy with [facility] at hospital. Please, advise with a date you prefer.

## 2012-01-28 ENCOUNTER — Other Ambulatory Visit: Payer: Self-pay | Admitting: *Deleted

## 2012-01-28 DIAGNOSIS — K922 Gastrointestinal hemorrhage, unspecified: Secondary | ICD-10-CM

## 2012-01-28 NOTE — Telephone Encounter (Signed)
Spoke with patient and she prefers Thursday. Scheduled at [facility] endo enteroscopy with [facility] on 02/05/12 at 7:00/8:00 AM. [name] Booking number [phone].Patient has written instructions from her cancelled procedure. Reviewed them over the phone with patient. She voices understanding.

## 2012-02-04 NOTE — H&P (Signed)
  Jade Ellison [birth year] MRN [id number]        History of Present Illness:  This is a 67 y.o female with microcytic anemia, corrected with Iron infusion and evidence of duodenal avm on SBCE in Oct 2012. She has a Barrett;s esophagus diagnosed on EGD Oct 2012. Her colonoscopy was normal. She is having enteroscopy with possible Argon laser ablation of small bowl avm.   Past Medical History  Diagnosis Date  . Colon polyp     polypoid colorectal mucosa  . Diverticulosis   . Depression   . Nephrolithiasis   . Sleep apnea   . Hypertension   . Obesity   . Diabetes mellitus   . Hemorrhoids   . Hernia of unspecified site of abdominal cavity without mention of obstruction or gangrene   . Arthritis   . Anemia   . Gastritis   . Esophagitis   . Barrett esophagus 10/14/10   Past Surgical History  Procedure Date  . Left sided lithotripsy   . Cholecystectomy   . Knee arthroscopy   . Cardiac catheterization   . Hernia repair   . Tubal ligation   . Cesarean section 1986    reports that she has never smoked. She has never used smokeless tobacco. She reports that she drinks about .6 ounces of alcohol per week. She reports that she does not use illicit drugs. family history includes Diabetes in her brother and sister; Heart disease in her father and mother; and Kidney cancer in her brother. Allergies  Allergen Reactions  . Codeine Rash  . Nsaids     Abdominal Pain        Review of Systems:  The remainder of the 10 point ROS is negative except as outlined in H&P   Physical Exam: General appearance  Well developed, in no distress. Eyes- non icteric. HEENT nontraumatic, normocephalic. Mouth no lesions, tongue papillated, no cheilosis. Neck supple without adenopathy, thyroid not enlarged, no carotid bruits, no JVD. Lungs Clear to auscultation bilaterally. Cor normal S1, normal S2, regular rhythm, no murmur,  quiet precordium. Abdomen: soft, non tender,  Rectal:heme  negative stool Extremities no pedal edema. Skin no lesions. Neurological alert and oriented x 3. Psychological normal mood and affect.  Assessment and Plan:  Iron deficiency anemia corrected with Iron infusion,  S/p complete GI eval showing duodenal avm.Plan enteroscopy and attempt to localize the avm and ablate it. Also plan to obtain surveillence biopsies from Barrett's esophagus   02/04/2012 [name]

## 2012-02-05 ENCOUNTER — Ambulatory Visit (HOSPITAL_COMMUNITY)
Admission: RE | Admit: 2012-02-05 | Discharge: 2012-02-05 | Disposition: A | Discharge status: Home / Self Care | Payer: Medicare Other | Source: Ambulatory Visit | Attending: Internal Medicine | Admitting: Internal Medicine

## 2012-02-05 ENCOUNTER — Encounter (HOSPITAL_COMMUNITY)
Admission: RE | Disposition: A | Discharge status: Home / Self Care | Payer: Self-pay | Source: Ambulatory Visit | Attending: Internal Medicine

## 2012-02-05 ENCOUNTER — Encounter (HOSPITAL_COMMUNITY): Payer: Self-pay | Admitting: *Deleted

## 2012-02-05 DIAGNOSIS — K922 Gastrointestinal hemorrhage, unspecified: Secondary | ICD-10-CM

## 2012-02-05 DIAGNOSIS — A048 Other specified bacterial intestinal infections: Secondary | ICD-10-CM | POA: Insufficient documentation

## 2012-02-05 DIAGNOSIS — G4730 Sleep apnea, unspecified: Secondary | ICD-10-CM | POA: Insufficient documentation

## 2012-02-05 DIAGNOSIS — I10 Essential (primary) hypertension: Secondary | ICD-10-CM | POA: Insufficient documentation

## 2012-02-05 DIAGNOSIS — K2960 Other gastritis without bleeding: Secondary | ICD-10-CM | POA: Insufficient documentation

## 2012-02-05 DIAGNOSIS — K2270 Barrett's esophagus without dysplasia: Secondary | ICD-10-CM | POA: Insufficient documentation

## 2012-02-05 DIAGNOSIS — E119 Type 2 diabetes mellitus without complications: Secondary | ICD-10-CM | POA: Insufficient documentation

## 2012-02-05 DIAGNOSIS — D649 Anemia, unspecified: Secondary | ICD-10-CM

## 2012-02-05 DIAGNOSIS — K319 Disease of stomach and duodenum, unspecified: Secondary | ICD-10-CM | POA: Insufficient documentation

## 2012-02-05 DIAGNOSIS — E669 Obesity, unspecified: Secondary | ICD-10-CM | POA: Insufficient documentation

## 2012-02-05 HISTORY — PX: HOT HEMOSTASIS: SHX5433

## 2012-02-05 HISTORY — PX: ENTEROSCOPY: SHX5533

## 2012-02-05 LAB — GLUCOSE, CAPILLARY: Glucose-Capillary: 99 mg/dL (ref 70–99)

## 2012-02-05 SURGERY — ENTEROSCOPY
Anesthesia: Moderate Sedation

## 2012-02-05 MED ORDER — DIPHENHYDRAMINE HCL 50 MG/ML IJ SOLN
INTRAMUSCULAR | Status: AC
  Filled 2012-02-05: qty 1

## 2012-02-05 MED ORDER — MIDAZOLAM HCL 10 MG/2ML IJ SOLN
INTRAMUSCULAR | Status: AC
  Filled 2012-02-05: qty 4

## 2012-02-05 MED ORDER — FENTANYL CITRATE 0.05 MG/ML IJ SOLN
INTRAMUSCULAR | Status: AC
  Filled 2012-02-05: qty 4

## 2012-02-05 MED ORDER — MIDAZOLAM HCL 10 MG/2ML IJ SOLN
INTRAMUSCULAR | Status: DC | PRN
  Administered 2012-02-05 (×3): 2 mg via INTRAVENOUS
  Administered 2012-02-05: 1 mg via INTRAVENOUS

## 2012-02-05 MED ORDER — DIPHENHYDRAMINE HCL 50 MG/ML IJ SOLN
INTRAMUSCULAR | Status: DC | PRN
  Administered 2012-02-05: 25 mg via INTRAVENOUS

## 2012-02-05 MED ORDER — BUTAMBEN-TETRACAINE-BENZOCAINE 2-2-14 % EX AERO
INHALATION_SPRAY | CUTANEOUS | Status: DC | PRN
  Administered 2012-02-05: 2 via TOPICAL

## 2012-02-05 MED ORDER — SODIUM CHLORIDE 0.9 % IV SOLN
INTRAVENOUS | Status: DC
  Administered 2012-02-05: 08:00:00 via INTRAVENOUS

## 2012-02-05 MED ORDER — FENTANYL CITRATE 0.05 MG/ML IJ SOLN
INTRAMUSCULAR | Status: DC | PRN
  Administered 2012-02-05 (×2): 25 ug via INTRAVENOUS

## 2012-02-05 NOTE — Interval H&P Note (Signed)
History and Physical Interval Note:  02/05/2012 8:04 AM  Jade Ellison  has presented today for surgery, with the diagnosis of chronic GI bleed  The various methods of treatment have been discussed with the patient and family. After consideration of risks, benefits and other options for treatment, the patient has consented to  Procedure(s) (LRB) with comments: ENTEROSCOPY (N/A) HOT HEMOSTASIS (ARGON PLASMA COAGULATION/BICAP) (N/A) as a surgical intervention .  The patient's history has been reviewed, patient examined, no change in status, stable for surgery.  I have reviewed the patient's chart and labs.  Questions were answered to the patient's satisfaction.     [name]

## 2012-02-05 NOTE — Op Note (Signed)
[facility] [address] [place], [address]   OPERATIVE PROCEDURE REPORT  PATIENT: Jade Ellison, Jade Ellison  MR#: [id number] BIRTHDATE: Oct 04, 1945 , 66  yrs. old GENDER: Female ENDOSCOPIST: Hart Carwin, MD REFERRED BY:  Eli Hose, M.D., Dr [facility] PROCEDURE DATE: 02/05/2012 PROCEDURE:   Small bowel enteroscopy with biopsy ASA CLASS:   Class III INDICATIONS:1.  iron deficiency anemia. ,corrected with iron, SBCE showed   avm in the duodenum, hx of Barrett's esophagus 2011 MEDICATIONS: These medications were titrated to patient response per physician's verbal order, , Versed 7 mg IV, Fentanyl 50 mcg IV, and Benadryl 25 mg IV TOPICAL ANESTHETIC:   Cetacaine Spray  DESCRIPTION OF PROCEDURE:   After the risks benefits and alternatives of the procedure were thoroughly explained, informed consent was obtained.  The Pentax EC-[id number] (s/n [id number]) endoscope was introduced through the mouth  and advanced to the proximal jejunum jejunum , limited by Without limitations.   The instrument was slowly withdrawn as the mucosa was fully examined.    Gastritis was found in the gastric antrum, superficial erosions, no active bleedingA biopsy for H.  pylori was taken. Jejunum to 150 cm was normal with no evidence of avm's, biopsies were taken from g-e junction to r/o Barrett's esophagus  Retroflexed views revealed no abnormalities.    The scope was then withdrawn from the patient and the procedure terminated.  COMPLICATIONS: There were no complications. ENDOSCOPIC IMPRESSION: Gastritis was found , s/p biopsies Hx Barrett's esophagus, s/p biopsies No evidence of avm's  RECOMMENDATIONS: await biopsy results continue to monitor H/H  REPEAT EXAM: no recall  _______________________________ eSignedHart Carwin, MD 02/05/2012 9:07 AM   CC:

## 2012-02-06 ENCOUNTER — Encounter: Payer: Self-pay | Admitting: Internal Medicine

## 2012-02-06 ENCOUNTER — Encounter (HOSPITAL_COMMUNITY): Payer: Self-pay | Admitting: Internal Medicine

## 2012-02-07 ENCOUNTER — Encounter (HOSPITAL_COMMUNITY): Payer: Self-pay | Admitting: Emergency Medicine

## 2012-02-07 ENCOUNTER — Emergency Department (HOSPITAL_COMMUNITY)
Admission: EM | Admit: 2012-02-07 | Discharge: 2012-02-08 | Disposition: A | Discharge status: Home / Self Care | Payer: Medicare Other | Attending: Emergency Medicine | Admitting: Emergency Medicine

## 2012-02-07 DIAGNOSIS — I10 Essential (primary) hypertension: Secondary | ICD-10-CM | POA: Insufficient documentation

## 2012-02-07 DIAGNOSIS — Z7982 Long term (current) use of aspirin: Secondary | ICD-10-CM | POA: Insufficient documentation

## 2012-02-07 DIAGNOSIS — F329 Major depressive disorder, single episode, unspecified: Secondary | ICD-10-CM | POA: Insufficient documentation

## 2012-02-07 DIAGNOSIS — E119 Type 2 diabetes mellitus without complications: Secondary | ICD-10-CM | POA: Insufficient documentation

## 2012-02-07 DIAGNOSIS — Z860100 Personal history of colon polyps, unspecified: Secondary | ICD-10-CM | POA: Insufficient documentation

## 2012-02-07 DIAGNOSIS — Z87442 Personal history of urinary calculi: Secondary | ICD-10-CM | POA: Insufficient documentation

## 2012-02-07 DIAGNOSIS — E669 Obesity, unspecified: Secondary | ICD-10-CM | POA: Insufficient documentation

## 2012-02-07 DIAGNOSIS — Z8739 Personal history of other diseases of the musculoskeletal system and connective tissue: Secondary | ICD-10-CM | POA: Insufficient documentation

## 2012-02-07 DIAGNOSIS — H53149 Visual discomfort, unspecified: Secondary | ICD-10-CM | POA: Insufficient documentation

## 2012-02-07 DIAGNOSIS — Z79899 Other long term (current) drug therapy: Secondary | ICD-10-CM | POA: Insufficient documentation

## 2012-02-07 DIAGNOSIS — R51 Headache: Secondary | ICD-10-CM | POA: Insufficient documentation

## 2012-02-07 DIAGNOSIS — F3289 Other specified depressive episodes: Secondary | ICD-10-CM | POA: Insufficient documentation

## 2012-02-07 DIAGNOSIS — R1110 Vomiting, unspecified: Secondary | ICD-10-CM

## 2012-02-07 DIAGNOSIS — R112 Nausea with vomiting, unspecified: Secondary | ICD-10-CM | POA: Insufficient documentation

## 2012-02-07 DIAGNOSIS — D649 Anemia, unspecified: Secondary | ICD-10-CM | POA: Insufficient documentation

## 2012-02-07 DIAGNOSIS — Z8719 Personal history of other diseases of the digestive system: Secondary | ICD-10-CM | POA: Insufficient documentation

## 2012-02-07 DIAGNOSIS — Z8669 Personal history of other diseases of the nervous system and sense organs: Secondary | ICD-10-CM | POA: Insufficient documentation

## 2012-02-07 DIAGNOSIS — K2270 Barrett's esophagus without dysplasia: Secondary | ICD-10-CM | POA: Insufficient documentation

## 2012-02-07 MED ORDER — ONDANSETRON HCL 4 MG/2ML IJ SOLN
4.0000 mg | Freq: Once | INTRAMUSCULAR | Status: AC
  Administered 2012-02-07: 4 mg via INTRAVENOUS
  Filled 2012-02-07: qty 2

## 2012-02-07 MED ORDER — DIPHENHYDRAMINE HCL 50 MG/ML IJ SOLN
25.0000 mg | Freq: Once | INTRAMUSCULAR | Status: AC
  Administered 2012-02-07: 25 mg via INTRAVENOUS
  Filled 2012-02-07: qty 1

## 2012-02-07 MED ORDER — MORPHINE SULFATE 2 MG/ML IJ SOLN
2.0000 mg | Freq: Once | INTRAMUSCULAR | Status: AC
  Administered 2012-02-07: 2 mg via INTRAVENOUS
  Filled 2012-02-07: qty 1

## 2012-02-07 MED ORDER — MORPHINE SULFATE 4 MG/ML IJ SOLN
4.0000 mg | Freq: Once | INTRAMUSCULAR | Status: AC
  Administered 2012-02-07: 4 mg via INTRAVENOUS
  Filled 2012-02-07: qty 1

## 2012-02-07 MED ORDER — SODIUM CHLORIDE 0.9 % IV BOLUS (SEPSIS)
1000.0000 mL | Freq: Once | INTRAVENOUS | Status: AC
  Administered 2012-02-07: 1000 mL via INTRAVENOUS

## 2012-02-07 MED ORDER — METOCLOPRAMIDE HCL 5 MG/ML IJ SOLN
10.0000 mg | Freq: Once | INTRAMUSCULAR | Status: AC
  Administered 2012-02-07: 10 mg via INTRAVENOUS
  Filled 2012-02-07: qty 2

## 2012-02-07 NOTE — ED Provider Notes (Signed)
History     CSN: [id number]  Arrival date & time 02/07/12  2105   First MD Initiated Contact with Patient 02/07/12 2113      Chief Complaint  Patient presents with  . Headache  . Emesis    (Consider location/radiation/quality/duration/timing/severity/associated sxs/prior treatment) HPI Comments: 67 y/o F p/w ha. Intermittent x1 week. Gradual onset today. Worsening. Associated emesis today. Also with intermittent stuttering of speech today. Similar to prior presentation few months ago. Otherwise feeling well. No fevers. Onset about 2.5 hours ago.  Patient is a 67 y.o. female presenting with headaches and vomiting. The history is provided by the patient.  Headache  This is a recurrent problem. Episode onset: 2.5 hrs ago. The problem occurs constantly. The problem has been gradually worsening. The headache is associated with emotional stress. The pain is located in the left unilateral region. The quality of the pain is described as throbbing. The pain is severe. The pain does not radiate. Associated symptoms include nausea and vomiting. Pertinent negatives include no fever, no chest pressure and no shortness of breath. She has tried oral narcotic analgesics for the symptoms. The treatment provided mild relief.  Emesis  This is a new problem. The current episode started 1 to 2 hours ago. The problem occurs 2 to 4 times per day. The problem has not changed since onset.The emesis has an appearance of stomach contents. There has been no fever. Associated symptoms include headaches. Pertinent negatives include no abdominal pain, no chills, no cough, no diarrhea and no fever.    Past Medical History  Diagnosis Date  . Colon polyp     polypoid colorectal mucosa  . Diverticulosis   . Depression   . Nephrolithiasis   . Sleep apnea   . Hypertension   . Obesity   . Diabetes mellitus   . Hemorrhoids   . Hernia of unspecified site of abdominal cavity without mention of obstruction or gangrene     . Arthritis   . Anemia   . Gastritis   . Esophagitis   . Barrett esophagus 10/14/10    Past Surgical History  Procedure Date  . Left sided lithotripsy   . Cholecystectomy   . Knee arthroscopy   . Cardiac catheterization   . Hernia repair   . Tubal ligation   . Cesarean section 1986  . Enteroscopy 02/05/2012    Procedure: ENTEROSCOPY;  Surgeon: Hart Carwin, MD;  Location: [facility];  Service: Endoscopy;  Laterality: N/A;  . Hot hemostasis 02/05/2012    Procedure: HOT HEMOSTASIS (ARGON PLASMA COAGULATION/BICAP);  Surgeon: Hart Carwin, MD;  Location: [facility];  Service: Endoscopy;  Laterality: N/A;    Family History  Problem Relation Age of Onset  . Heart disease Mother   . Heart disease Father   . Diabetes Brother   . Diabetes Sister   . Kidney cancer Brother     History  Substance Use Topics  . Smoking status: Never Smoker   . Smokeless tobacco: Never Used  . Alcohol Use: 0.6 oz/week    1 Glasses of wine per week     Comment: wine sometimes on holidays and bedtime    OB History    Grav Para Term Preterm Abortions TAB SAB Ect Mult Living                  Review of Systems  Constitutional: Negative for fever and chills.  HENT: Negative for congestion and rhinorrhea.   Eyes: Positive for photophobia. Negative  for pain and visual disturbance.  Respiratory: Negative for cough and shortness of breath.   Cardiovascular: Negative for chest pain and leg swelling.  Gastrointestinal: Positive for nausea and vomiting. Negative for abdominal pain and diarrhea.  Genitourinary: Negative for dysuria, hematuria, flank pain and difficulty urinating.  Musculoskeletal: Negative for back pain.  Skin: Negative for color change and rash.  Neurological: Positive for headaches. Negative for dizziness.       Stuttering today  All other systems reviewed and are negative.    Allergies  Codeine and Nsaids  Home Medications   Current Outpatient Rx  Name  Route  Sig   Dispense  Refill  . ALPRAZOLAM 0.5 MG PO TABS   Oral   Take 0.5 mg by mouth at bedtime as needed. For anxiety         . AMLODIPINE BESYLATE 5 MG PO TABS   Oral   Take 5 mg by mouth daily.          . ASPIRIN EC 81 MG PO TBEC   Oral   Take 162 mg by mouth daily.         Marland Kitchen DICYCLOMINE HCL 20 MG PO TABS   Oral   Take 1 tablet (20 mg total) by mouth 2 (two) times daily.   60 tablet   2   . FERROUS SULFATE 325 (65 FE) MG PO TABS   Oral   Take 325 mg by mouth daily with breakfast.          . FLUOXETINE HCL 20 MG PO CAPS   Oral   Take 20 mg by mouth daily.          . FUROSEMIDE 40 MG PO TABS   Oral   Take 40 mg by mouth daily.          Marland Kitchen HYDROCODONE-ACETAMINOPHEN 5-500 MG PO TABS               . LATANOPROST 0.005 % OP SOLN   Both Eyes   Place 1 drop into both eyes daily.          Marland Kitchen LOSARTAN POTASSIUM 50 MG PO TABS   Oral   Take 50 mg by mouth daily.         Marland Kitchen METFORMIN HCL 1000 MG PO TABS   Oral   Take 1,000 mg by mouth 2 (two) times daily with a meal.           . METOPROLOL TARTRATE 25 MG PO TABS   Oral   Take 25 mg by mouth daily.         . OMEGA 3 1000 MG PO CAPS   Oral   Take 1 capsule by mouth daily.          Marland Kitchen OMEPRAZOLE 20 MG PO CPDR   Oral   Take 1 capsule (20 mg total) by mouth daily.   30 capsule   3   . ROSUVASTATIN CALCIUM 20 MG PO TABS   Oral   Take 20 mg by mouth daily.           . TRAVOPROST (BAK FREE) 0.004 % OP SOLN   Both Eyes   Place 1 drop into both eyes at bedtime.            BP 147/75  Pulse 78  Temp 99.8 F (37.7 C) (Oral)  Resp 14  SpO2 94%  Physical Exam  Nursing note and vitals reviewed. Constitutional: She is oriented to person, place, and  time. She appears well-developed and well-nourished.       Laying in bed. Intermittent stuttering.  HENT:  Head: Normocephalic and atraumatic.  Eyes: Conjunctivae normal are normal. Right eye exhibits no discharge. Left eye exhibits no discharge.    Neck: No tracheal deviation present.  Cardiovascular: Normal rate, regular rhythm, normal heart sounds and intact distal pulses.   Pulmonary/Chest: Effort normal and breath sounds normal. No stridor. No respiratory distress. She has no wheezes. She has no rales.  Abdominal: Soft. She exhibits no distension. There is no tenderness. There is no rigidity and no guarding.  Musculoskeletal: She exhibits no edema and no tenderness.  Neurological: She is alert and oriented to person, place, and time. She has normal strength. No cranial nerve deficit or sensory deficit. Coordination normal. GCS eye subscore is 4. GCS verbal subscore is 5. GCS motor subscore is 6.       Decreased strength in RLE only 2/2 chronic hip pain. Otherwise strength intact  Skin: Skin is warm and dry.  Psychiatric: She has a normal mood and affect. Her behavior is normal.    ED Course  Procedures (including critical care time)   Labs Reviewed  URINALYSIS, ROUTINE W REFLEX MICROSCOPIC  CBC  COMPREHENSIVE METABOLIC PANEL   No results found.   No diagnosis found.    MDM   67 y/o F p/w HA HDS, af.  No thunderclap or worst ha of life to suggest SAH. No fevers or meningeal signs to suggest meningitis. Able to ROM neck. Baseline per family member. Patient with h/o arthritis.  Neurologically intact, symptoms similar to prior ha throughout this past week and prior admission. Doubt stroke. No h/o trauma.  Due to persistence of symptoms. Will obtain CT head and labs to further assess.  Doubt idiopathic intracranial hypertension, cerebral venous thrombosis, temporal arteritis, skull fracture, epidural hematoma, subdural hematoma, subarachnoid hemorrhage, or other intracranial hemorrhage, concussion, trigeminal neuralgia, cluster headache, eye pathology or other emergent pathology as this is an atypical history and physical, low risk, and primary diagnosis is much more likely.  Care of pt taken over by Dr. Bebe Shaggy at  12:36 AM          Stevie Kern, MD 02/08/12 [phone]

## 2012-02-07 NOTE — ED Notes (Signed)
PT. ARRIVED WITH EMS FROM HOME ,  REPORTS HEADACHE FOR SEVERAL DAYS AND VOMITTED TODAY , RECEIVED ZOFRAN 4 MG IV PTA.  VOMITTED AT ARRIVAL WITH [facility].

## 2012-02-08 ENCOUNTER — Emergency Department (HOSPITAL_COMMUNITY): Payer: Medicare Other

## 2012-02-08 LAB — COMPREHENSIVE METABOLIC PANEL
ALT: 7 U/L (ref 0–35)
AST: 13 U/L (ref 0–37)
Albumin: 3.6 g/dL (ref 3.5–5.2)
Alkaline Phosphatase: 76 U/L (ref 39–117)
BUN: 11 mg/dL (ref 6–23)
Chloride: 99 mEq/L (ref 96–112)
Potassium: 4 mEq/L (ref 3.5–5.1)
Sodium: 137 mEq/L (ref 135–145)
Total Protein: 7.1 g/dL (ref 6.0–8.3)

## 2012-02-08 LAB — URINE MICROSCOPIC-ADD ON

## 2012-02-08 LAB — URINALYSIS, ROUTINE W REFLEX MICROSCOPIC
Ketones, ur: NEGATIVE mg/dL
Leukocytes, UA: NEGATIVE
Nitrite: NEGATIVE
Protein, ur: 100 mg/dL — AB
Urobilinogen, UA: 0.2 mg/dL (ref 0.0–1.0)

## 2012-02-08 LAB — CBC
MCHC: 33.1 g/dL (ref 30.0–36.0)
Platelets: 147 10*3/uL — ABNORMAL LOW (ref 150–400)
RDW: 14.8 % (ref 11.5–15.5)
WBC: 7.5 10*3/uL (ref 4.0–10.5)

## 2012-02-08 NOTE — ED Provider Notes (Signed)
Assumed care of patient at signout Here for HA and vomiting.  Jade Ellison has been present for a week  per patient/family She is feeling improved - no focal weakness noted.  No signs of glaucoma Referred to neuro as outpatient Patient/family feel comfortable with discharging patient  [name], MD 02/08/12 [phone]

## 2012-02-08 NOTE — ED Notes (Signed)
TRANSPORTED TO RADIOLOGY. FALL RISK WRIST BAND / RED NON SLIP SOCKS APPLIED.

## 2012-02-08 NOTE — ED Notes (Signed)
PT. UNABLE TO AMBULATE WITH EXTENSIVE ASSISTANCE. PT. IS UNSTEADY WHEN STANDING.

## 2012-02-08 NOTE — ED Provider Notes (Signed)
I saw and evaluated the patient, reviewed the resident's note and I agree with the findings and plan.   Consetta Cosner, MD 02/08/12 1532 

## 2012-02-08 NOTE — ED Notes (Signed)
I gave the patient a warm blanket. 

## 2012-02-24 ENCOUNTER — Telehealth: Payer: Self-pay | Admitting: *Deleted

## 2012-02-24 MED ORDER — HYDROCODONE-ACETAMINOPHEN 5-325 MG PO TABS: 1.0000 | ORAL_TABLET | Freq: Three times a day (TID) | ORAL | Status: DC | PRN

## 2012-02-24 NOTE — Telephone Encounter (Signed)
Called to get refill on hydrocodone.

## 2012-02-24 NOTE — Telephone Encounter (Signed)
Because of tylenol greater than 325mg  in controlled medications from now on, rx called in as hydrocodone 5/325 insteatd of 5/500.

## 2012-03-16 ENCOUNTER — Other Ambulatory Visit: Payer: Self-pay | Admitting: Internal Medicine

## 2012-03-22 ENCOUNTER — Other Ambulatory Visit: Payer: Self-pay

## 2012-03-22 MED ORDER — HYDROCODONE-ACETAMINOPHEN 5-325 MG PO TABS: 1.0000 | ORAL_TABLET | Freq: Three times a day (TID) | ORAL | Status: DC | PRN

## 2012-04-15 ENCOUNTER — Telehealth: Payer: Self-pay

## 2012-04-15 NOTE — Telephone Encounter (Signed)
Requested carotid results. Gave normal results.

## 2012-04-22 ENCOUNTER — Encounter: Payer: Self-pay | Admitting: Physical Medicine and Rehabilitation

## 2012-04-22 ENCOUNTER — Encounter
Payer: Medicare Other | Attending: Physical Medicine and Rehabilitation | Admitting: Physical Medicine and Rehabilitation

## 2012-04-22 VITALS — BP 123/43 | HR 63 | Resp 16 | Ht 63.0 in | Wt 258.8 lb

## 2012-04-22 DIAGNOSIS — M1611 Unilateral primary osteoarthritis, right hip: Secondary | ICD-10-CM

## 2012-04-22 DIAGNOSIS — Z5181 Encounter for therapeutic drug level monitoring: Secondary | ICD-10-CM

## 2012-04-22 DIAGNOSIS — Z79899 Other long term (current) drug therapy: Secondary | ICD-10-CM

## 2012-04-22 DIAGNOSIS — M129 Arthropathy, unspecified: Secondary | ICD-10-CM

## 2012-04-22 DIAGNOSIS — M169 Osteoarthritis of hip, unspecified: Secondary | ICD-10-CM

## 2012-04-22 DIAGNOSIS — M25569 Pain in unspecified knee: Secondary | ICD-10-CM | POA: Insufficient documentation

## 2012-04-22 DIAGNOSIS — M1990 Unspecified osteoarthritis, unspecified site: Secondary | ICD-10-CM

## 2012-04-22 DIAGNOSIS — M25561 Pain in right knee: Secondary | ICD-10-CM

## 2012-04-22 MED ORDER — DICLOFENAC SODIUM 1 % TD GEL: 2.0000 g | Freq: Four times a day (QID) | TRANSDERMAL | Status: DC

## 2012-04-22 MED ORDER — HYDROCODONE-ACETAMINOPHEN 5-325 MG PO TABS
1.0000 | ORAL_TABLET | Freq: Three times a day (TID) | ORAL | Status: DC | PRN
Start: 2012-04-22 — End: 2012-05-21

## 2012-04-22 NOTE — Progress Notes (Signed)
Subjective:    Patient ID: Jade Ellison, female    DOB: [birth year], 67 y.o.   MRN: [id number]  HPI The patient is a 67 year old female, who presents with right hip pain . The patient also complains about moderate pain in both knee, left worse, right hip pain, neck, both shoulders. Patient also complains about numbness and tingling in both hands and feet . She describes the pain as dull and stabbing intermittendly, depending on activety . Applying heat, taking medications , changing positions alleviate the symptoms. Prolonged standing aggrevates the symptoms. The patient grades his pain as a 7/10.  Pain Inventory Average Pain 7 Pain Right Now 7 My pain is dull and aching  In the last 24 hours, has pain interfered with the following? General activity 8 Relation with others 8 Enjoyment of life 10 What TIME of day is your pain at its worst? daytime and night Sleep (in general) Good  Pain is worse with: walking, sitting and some activites Pain improves with: heat/ice and medication Relief from Meds: 7  Mobility walk with assistance use a walker ability to climb steps?  no do you drive?  no  Function disabled: date disabled 81 I need assistance with the following:  household duties and shopping  Neuro/Psych bladder control problems trouble walking depression anxiety  Prior Studies Any changes since last visit?  yes x-rays done at [facility] ortho  Physicians involved in your care Any changes since last visit?  no   Family History  Problem Relation Age of Onset  . Heart disease Mother   . Heart disease Father   . Diabetes Brother   . Diabetes Sister   . Kidney cancer Brother    History   Social History  . Marital Status: Widowed    Spouse Name: N/A    Number of Children: 1  . Years of Education: N/A   Occupational History  . RETIRED    Social History Main Topics  . Smoking status: Never Smoker   . Smokeless tobacco: Never Used  . Alcohol Use: 0.6 oz/week     1 Glasses of wine per week     Comment: wine sometimes on holidays and bedtime  . Drug Use: No  . Sexually Active: None   Other Topics Concern  . None   Social History Narrative  . None   Past Surgical History  Procedure Laterality Date  . Left sided lithotripsy    . Cholecystectomy    . Knee arthroscopy    . Cardiac catheterization    . Hernia repair    . Tubal ligation    . Cesarean section  1986  . Enteroscopy  02/05/2012    Procedure: ENTEROSCOPY;  Surgeon: Hart Carwin, MD;  Location: [facility];  Service: Endoscopy;  Laterality: N/A;  . Hot hemostasis  02/05/2012    Procedure: HOT HEMOSTASIS (ARGON PLASMA COAGULATION/BICAP);  Surgeon: Hart Carwin, MD;  Location: [facility];  Service: Endoscopy;  Laterality: N/A;   Past Medical History  Diagnosis Date  . Colon polyp     polypoid colorectal mucosa  . Diverticulosis   . Depression   . Nephrolithiasis   . Sleep apnea   . Hypertension   . Obesity   . Diabetes mellitus   . Hemorrhoids   . Hernia of unspecified site of abdominal cavity without mention of obstruction or gangrene   . Arthritis   . Anemia   . Gastritis   . Esophagitis   . Barrett esophagus  10/14/10   BP 123/43  Pulse 63  Resp 16  Ht 5\' 3"  (1.6 m)  Wt 258 lb 12.8 oz (117.391 kg)  BMI 45.86 kg/m2  SpO2 97%    Review of Systems  Constitutional: Positive for appetite change.  Genitourinary: Positive for difficulty urinating.  Musculoskeletal:       Hip pain  Neurological:       Tingling  Psychiatric/Behavioral: Positive for dysphoric mood. The patient is nervous/anxious.   All other systems reviewed and are negative.       Objective:   Physical Exam  Constitutional: She is oriented to person, place, and time. She appears well-developed and well-nourished.  Morbidly obese In wheel chair  HENT:  Head: Normocephalic.  Neck: Neck supple.  Musculoskeletal: She exhibits tenderness.  Neurological: She is alert and oriented to  person, place, and time.  Skin: Skin is warm and dry.  Psychiatric: She has a normal mood and affect.    Symmetric normal motor tone is noted throughout. Normal muscle bulk. Muscle testing reveals 5/5 muscle strength of the upper extremity, and 5/5 of the lower extremity. Full range of motion in upper extremities, extension deficit 5-10 degrees in the left knee, 10-15 degrees in the right knee, flexion 90-100 degrees in the right knee, 80 degrees in the left. ROM of spine is restricted. Fine motor movements are normal in both hands.  DTR in the upper and lower extremity are present and symmetric 2+. No clonus is noted.  Patient in a wheel chair. .         Assessment & Plan:  1. 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 Prescribed Voltaren gel, patient can not tolerate oral anti inflammatories. 2. Right rotator cuff syndrome with bicipital tendonitis and bursitis.  3. Morbid obesity.  4. Myofascial cervical pain.  5. Peptic ulcer disease/esophagitis.  6. Arthritis of right hip  Patient should continue with the exercises program she learned from PT, and restart her aquatic exercises. Also continue with loosing weight.  Follow up in 3 month.

## 2012-04-22 NOTE — Patient Instructions (Signed)
Try to start aquatic exercises.  You can try Arnica cream for your arthritic pain.

## 2012-04-23 ENCOUNTER — Other Ambulatory Visit: Payer: Self-pay

## 2012-04-23 MED ORDER — HYDROCODONE-ACETAMINOPHEN 7.5-325 MG PO TABS: 1.0000 | ORAL_TABLET | Freq: Three times a day (TID) | ORAL | Status: DC | PRN

## 2012-05-10 ENCOUNTER — Other Ambulatory Visit: Payer: Self-pay

## 2012-05-10 DIAGNOSIS — Z1231 Encounter for screening mammogram for malignant neoplasm of breast: Secondary | ICD-10-CM

## 2012-05-21 ENCOUNTER — Other Ambulatory Visit: Payer: Self-pay

## 2012-05-21 MED ORDER — HYDROCODONE-ACETAMINOPHEN 7.5-325 MG PO TABS: 1.0000 | ORAL_TABLET | Freq: Three times a day (TID) | ORAL | Status: DC | PRN

## 2012-06-01 ENCOUNTER — Ambulatory Visit: Payer: Medicare Other

## 2012-06-08 ENCOUNTER — Ambulatory Visit: Payer: Medicare Other

## 2012-06-16 ENCOUNTER — Ambulatory Visit (HOSPITAL_BASED_OUTPATIENT_CLINIC_OR_DEPARTMENT_OTHER): Payer: Medicare Other | Admitting: Oncology

## 2012-06-16 ENCOUNTER — Other Ambulatory Visit (HOSPITAL_BASED_OUTPATIENT_CLINIC_OR_DEPARTMENT_OTHER): Payer: Medicare Other | Admitting: Lab

## 2012-06-16 ENCOUNTER — Telehealth: Payer: Self-pay | Admitting: Oncology

## 2012-06-16 VITALS — BP 151/69 | HR 54 | Temp 97.6°F | Resp 20 | Ht 63.0 in | Wt 263.0 lb

## 2012-06-16 DIAGNOSIS — D509 Iron deficiency anemia, unspecified: Secondary | ICD-10-CM

## 2012-06-16 DIAGNOSIS — E119 Type 2 diabetes mellitus without complications: Secondary | ICD-10-CM

## 2012-06-16 DIAGNOSIS — D649 Anemia, unspecified: Secondary | ICD-10-CM

## 2012-06-16 DIAGNOSIS — E785 Hyperlipidemia, unspecified: Secondary | ICD-10-CM

## 2012-06-16 DIAGNOSIS — I10 Essential (primary) hypertension: Secondary | ICD-10-CM

## 2012-06-16 LAB — IRON AND TIBC
%SAT: 20 % (ref 20–55)
Iron: 60 ug/dL (ref 42–145)
TIBC: 306 ug/dL (ref 250–470)
UIBC: 246 ug/dL (ref 125–400)

## 2012-06-16 LAB — CBC WITH DIFFERENTIAL/PLATELET
Basophils Absolute: 0 10*3/uL (ref 0.0–0.1)
Eosinophils Absolute: 0.1 10*3/uL (ref 0.0–0.5)
HGB: 13.8 g/dL (ref 11.6–15.9)
MONO#: 0.5 10*3/uL (ref 0.1–0.9)
NEUT#: 3.8 10*3/uL (ref 1.5–6.5)
RDW: 15.6 % — ABNORMAL HIGH (ref 11.2–14.5)
lymph#: 1.4 10*3/uL (ref 0.9–3.3)

## 2012-06-16 NOTE — Addendum Note (Signed)
Addended by: Reesa Chew on: 06/16/2012 04:25 PM   Modules accepted: Medications

## 2012-06-16 NOTE — Progress Notes (Signed)
Hematology and Oncology Follow Up Visit  Jade Ellison [id number] 1945/08/03 67 y.o. 06/16/2012 3:57 PM ANDY,CAMILLE L, MDDewey, Lanora Manis, MD   Principle Diagnosis: 67 year old woman with Iron deficiency anemia diagnosed in 2013.   Current therapy: Ferrous sulfate 325 mg PO daily. She was given iron dextran 1300 mg IV on 07/01/11 under the care of her GI doctors.   Interim History:  Jade Ellison is a 67 year old female seen for follow-up today. She is doing well since her last visit, she is still taking iron once a day. Reports fatigue is much better. No chest pain, shortness of breath, dyspnea. No abdominal pain, nausea, or vomiting. Reports that she has belching at times. She has not noticed any bleeding recently. She had not reported any bleeding or any complications related to Fe intake.   Medications: I have reviewed the patient's current medications.   Current Outpatient Prescriptions  Medication Sig Dispense Refill  . ALPRAZolam (XANAX) 0.5 MG tablet Take 0.5 mg by mouth at bedtime as needed. For anxiety      . amLODipine (NORVASC) 5 MG tablet Take 5 mg by mouth daily.       Marland Kitchen aspirin EC 81 MG tablet Take 162 mg by mouth daily.      . diclofenac sodium (VOLTAREN) 1 % GEL Apply 2 g topically 4 (four) times daily.  2 Tube  2  . dicyclomine (BENTYL) 20 MG tablet Take 1 tablet (20 mg total) by mouth 2 (two) times daily.  60 tablet  2  . ferrous sulfate 325 (65 FE) MG tablet Take 325 mg by mouth daily with breakfast.       . FLUoxetine (PROZAC) 20 MG capsule Take 20 mg by mouth daily.       . furosemide (LASIX) 40 MG tablet Take 40 mg by mouth daily.       Marland Kitchen HYDROcodone-acetaminophen (NORCO) 7.5-325 MG per tablet Take 1 tablet by mouth every 8 (eight) hours as needed for pain. Must last 30 days.  75 tablet  0  . latanoprost (XALATAN) 0.005 % ophthalmic solution Place 1 drop into both eyes daily.       Marland Kitchen losartan (COZAAR) 50 MG tablet Take 50 mg by mouth daily.      . metFORMIN (GLUCOPHAGE)  1000 MG tablet Take 1,000 mg by mouth 2 (two) times daily with a meal.        . metoprolol tartrate (LOPRESSOR) 25 MG tablet Take 25 mg by mouth daily.      . OMEGA 3 1000 MG CAPS Take 1 capsule by mouth daily.       Marland Kitchen omeprazole (PRILOSEC) 20 MG capsule TAKE ONE CAPSULE BY MOUTH DAILY  30 capsule  2  . rosuvastatin (CRESTOR) 20 MG tablet Take 20 mg by mouth daily.        . Travoprost, BAK Free, (TRAVATAMN) 0.004 % SOLN ophthalmic solution Place 1 drop into both eyes at bedtime.        No current facility-administered medications for this visit.    Allergies:  Allergies  Allergen Reactions  . Codeine Rash  . Nsaids     Abdominal Pain    Past Medical History, Surgical history, Social history, and Family History were reviewed and updated.  Review of Systems: Constitutional:  Negative for fever, chills, night sweats, anorexia, weight loss, pain. Cardiovascular: no chest pain or dyspnea on exertion Respiratory: no cough, shortness of breath, or wheezing Neurological: no TIA or stroke symptoms Dermatological: negative  ENT: negative Skin: Negative. Gastrointestinal: no abdominal pain, change in bowel habits, or black or bloody stools Genito-Urinary: no dysuria, trouble voiding, or hematuria Hematological and Lymphatic: negative Breast: negative for breast lumps Musculoskeletal: negative Remaining ROS negative.  Physical Exam: Blood pressure 151/69, pulse 54, temperature 97.6 F (36.4 C), temperature source Oral, resp. rate 20, height 5\' 3"  (1.6 m), weight 263 lb (119.296 kg). ECOG: 2 General appearance: alert, cooperative and no distress Head: Normocephalic, without obvious abnormality, atraumatic Neck: no adenopathy, no carotid bruit, no JVD, supple, symmetrical, trachea midline and thyroid not enlarged, symmetric, no tenderness/mass/nodules Lymph nodes: Cervical, supraclavicular, and axillary nodes normal. Heart:regular rate and rhythm, S1, S2 normal, no murmur, click, rub or  gallop Lung:chest clear, no wheezing, rales, normal symmetric air entry, no tachypnea, retractions or cyanosis Abdomen: soft, non-tender, without masses or organomegaly EXT:no erythema, induration, or nodules  Lab Results: Lab Results  Component Value Date   WBC 5.8 06/16/2012   HGB 13.8 06/16/2012   HCT 42.1 06/16/2012   MCV 83.1 06/16/2012   PLT 178 06/16/2012     Chemistry      Component Value Date/Time   NA 137 02/08/2012 0105   K 4.0 02/08/2012 0105   CL 99 02/08/2012 0105   CO2 28 02/08/2012 0105   BUN 11 02/08/2012 0105   CREATININE 0.85 02/08/2012 0105      Component Value Date/Time   CALCIUM 9.2 02/08/2012 0105   ALKPHOS 76 02/08/2012 0105   AST 13 02/08/2012 0105   ALT 7 02/08/2012 0105   BILITOT 0.7 02/08/2012 0105     Impression and Plan:  This is a 67 year old female with the following issues: 1. Iron deficiency anemia. GI work-up is unrevealing. Hemoglobin has now corrected with oral iron and IV iron. Recommend that she continue oral iron once a day. Will continue to monitor her labs. She may need additional IV iron in the future. 2. DM. Continue Metformin and Actos per PCP. 3. Hyperlipidemia. Continue Crestor per PCP. 4. HTN. Continue Norvasc, Lotensin, Lasix, and Benicar per PCP. 5. Follow-up. In about 6 months to monitor labs.     Trig Mcbryar 6/4/20143:57 PM

## 2012-06-16 NOTE — Telephone Encounter (Signed)
gv and printed appt sched and avs for pt  °

## 2012-06-17 ENCOUNTER — Other Ambulatory Visit: Payer: Self-pay | Admitting: Physical Medicine and Rehabilitation

## 2012-07-13 ENCOUNTER — Ambulatory Visit: Payer: Medicare Other

## 2012-07-18 ENCOUNTER — Other Ambulatory Visit: Payer: Self-pay | Admitting: Internal Medicine

## 2012-07-21 ENCOUNTER — Other Ambulatory Visit: Payer: Self-pay | Admitting: Physical Medicine and Rehabilitation

## 2012-07-22 ENCOUNTER — Encounter
Payer: Medicare Other | Attending: Physical Medicine and Rehabilitation | Admitting: Physical Medicine and Rehabilitation

## 2012-07-28 ENCOUNTER — Ambulatory Visit: Payer: Medicare Other | Admitting: Physical Medicine and Rehabilitation

## 2012-08-20 ENCOUNTER — Other Ambulatory Visit: Payer: Self-pay | Admitting: Physical Medicine and Rehabilitation

## 2012-08-30 ENCOUNTER — Encounter: Payer: Self-pay | Admitting: Cardiovascular Disease

## 2012-08-31 ENCOUNTER — Encounter: Payer: Self-pay | Admitting: Physician Assistant

## 2012-08-31 ENCOUNTER — Ambulatory Visit (INDEPENDENT_AMBULATORY_CARE_PROVIDER_SITE_OTHER): Payer: Medicare Other | Admitting: Physician Assistant

## 2012-08-31 VITALS — BP 130/90 | HR 50 | Ht 63.0 in | Wt 262.0 lb

## 2012-08-31 DIAGNOSIS — I10 Essential (primary) hypertension: Secondary | ICD-10-CM

## 2012-08-31 DIAGNOSIS — E669 Obesity, unspecified: Secondary | ICD-10-CM

## 2012-08-31 NOTE — Assessment & Plan Note (Signed)
Blood pressure is well-controlled at this time. 

## 2012-08-31 NOTE — Patient Instructions (Signed)
Continue the great job you have been doing of losing weight!  Follow up with Dr. Allyson Sabal in 6 months.

## 2012-08-31 NOTE — Progress Notes (Signed)
Date:  08/31/2012   ID:  Jade Ellison, DOB 10-Aug-1945, MRN [id number]  PCP:  Jade Ora, MD  Primary Cardiologist:  Jade Ellison     History of Present Illness: Jade Ellison is a 67 y.o. female is history of morbid obesity, hypertension, normal coronary arteries and normal LV function by cath in 2003 by Dr. Eden Ellison, obstructive sleep apnea on CPAP, hyperlipidemia, type 2 diabetes mellitus.  Patient presents for 6 month followup.  She reports significant for left knee pain and hip pain which is chronic for her but limits her activity levels. She does report that looking forward to going back to water aerobics soon as her granddaughter goes to school.  The patient currently denies nausea, vomiting, fever, chest pain, shortness of breath, orthopnea, dizziness, PND, cough, congestion, abdominal pain, hematochezia, melena, lower extremity edema, claudication.  Wt Readings from Last 3 Encounters:  08/31/12 262 lb (118.842 kg)  06/16/12 263 lb (119.296 kg)  04/22/12 258 lb 12.8 oz (117.391 kg)     Past Medical History  Diagnosis Date  . Colon polyp     polypoid colorectal mucosa  . Diverticulosis   . Depression   . Nephrolithiasis   . Sleep apnea   . Obesity   . Diabetes mellitus   . Hemorrhoids   . Hernia of unspecified site of abdominal cavity without mention of obstruction or gangrene   . Arthritis   . Anemia   . Gastritis   . Esophagitis   . 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 Hypertension 09 07 2013    TRANSTHORACIC ECHO STUDY CONCLUSIONS   . Hypertension 09 07 2013    EJECTION FRACTION- 55%-60% .WALL MOTION WAS NORMAL    Current Outpatient Prescriptions  Medication Sig Dispense Refill  . ALPRAZolam (XANAX) 0.25 MG tablet 0.25 mg. Take 1 tablet (0.25 mg total) by mouth at bedtime as needed for Sleep or Anxiety.      Marland Kitchen amLODipine (NORVASC) 5 MG tablet Take 5 mg by mouth daily.       Marland Kitchen aspirin EC 81 MG tablet Take 162 mg by mouth daily.      . calcium-vitamin D (OSCAL WITH  D) 500-200 MG-UNIT per tablet Take 1 tablet by mouth daily. Take 1 tablet by mouth daily.      Marland Kitchen desonide (DESOWEN) 0.05 % cream Apply 0.05 application topically daily. Uses PRN      . diclofenac sodium (VOLTAREN) 1 % GEL Apply 2 g topically 4 (four) times daily.  2 Tube  2  . dicyclomine (BENTYL) 20 MG tablet Take 1 tablet (20 mg total) by mouth 2 (two) times daily.  60 tablet  2  . ferrous sulfate 325 (65 FE) MG tablet Take 325 mg by mouth daily with breakfast.       . FLUoxetine (PROZAC) 20 MG capsule Take 20 mg by mouth daily.       . furosemide (LASIX) 40 MG tablet Take 40 mg by mouth daily.       Marland Kitchen HYDROcodone-acetaminophen (NORCO) 7.5-325 MG per tablet TAKE 1 TABLET BY MOUTH EVERY 8 HOURS AS NEEDED FOR PAIN FOR 30 DAYS  75 tablet  0  . Inulin (METAMUCIL CLEAR & NATURAL) POWD Take 1 packet by mouth daily.      Marland Kitchen latanoprost (XALATAN) 0.005 % ophthalmic solution Place 1 drop into both eyes daily.       Marland Kitchen losartan (COZAAR) 50 MG tablet Take 50 mg by mouth daily.      Marland Kitchen  meloxicam (MOBIC) 7.5 MG tablet Take 1 tablet by mouth daily.      . metFORMIN (GLUCOPHAGE) 1000 MG tablet Take 1,000 mg by mouth daily.       . metoprolol tartrate (LOPRESSOR) 25 MG tablet Take 25 mg by mouth daily.      . Mouthwashes (BIOTENE/CALCIUM MT) Take 1 tablet by mouth daily. Use as directed 1 tablet in the mouth or throat daily.      . OMEGA 3 1000 MG CAPS Take 1 capsule by mouth daily.       Marland Kitchen omeprazole (PRILOSEC) 20 MG capsule TAKE ONE CAPSULE BY MOUTH EVERY DAY  30 capsule  2  . psyllium (METAMUCIL SMOOTH TEXTURE) 28 % packet Take 1 packet by mouth daily. Take 1 packet by mouth 2 (two) times daily.      . rosuvastatin (CRESTOR) 20 MG tablet Take 20 mg by mouth daily.        . Travoprost, BAK Free, (TRAVATAMN) 0.004 % SOLN ophthalmic solution Place 1 drop into both eyes at bedtime.        No current facility-administered medications for this visit.    Allergies:    Allergies  Allergen Reactions  . Codeine  Rash  . Nsaids     Abdominal Pain    Social History:  The patient  reports that she has never smoked. She has never used smokeless tobacco. She reports that she drinks about 0.6 ounces of alcohol per week. She reports that she does not use illicit drugs.   Family history:   Family History  Problem Relation Age of Onset  . Heart disease Mother   . Heart disease Father   . Diabetes Brother   . Diabetes Sister   . Kidney cancer Brother     ROS:  Please see the history of present illness.  All other systems reviewed and negative.   PHYSICAL EXAM: VS:  BP 130/90  Pulse 50  Ht 5\' 3"  (1.6 m)  Wt 262 lb (118.842 kg)  BMI 46.42 kg/m2 Obese well developed, in no acute distress HEENT: Pupils are equal round react to light accommodation extraocular movements are intact.  Neck: No cervical lymphadenopathy. Cardiac: Regular rate and rhythm without murmurs rubs or gallops. Lungs:  clear to auscultation bilaterally, no wheezing, rhonchi or rales Abd: soft, obese, nontender, positive bowel sounds all quadrants, Ext: no lower extremity edema.  2+ radial and dorsalis pedis pulses. Skin: warm and dry Neuro:  Grossly normal  EKG:  Sinus bradycardia 51bpm.  ASSESSMENT AND PLAN:  Problem List Items Addressed This Visit   OBESITY     Patient reports losing approximately 60 pounds. I've encouraged her to keep up the good work.    Obesity - Primary   HYPERTENSION     Blood pressure is well-controlled at this time.

## 2012-08-31 NOTE — Assessment & Plan Note (Signed)
Patient reports losing approximately 60 pounds. I've encouraged her to keep up the good work.

## 2012-09-28 ENCOUNTER — Ambulatory Visit (HOSPITAL_COMMUNITY)
Admission: RE | Admit: 2012-09-28 | Discharge: 2012-09-28 | Disposition: A | Discharge status: Home / Self Care | Payer: Medicare Other | Source: Ambulatory Visit | Attending: Family Medicine | Admitting: Family Medicine

## 2012-09-28 DIAGNOSIS — Z1231 Encounter for screening mammogram for malignant neoplasm of breast: Secondary | ICD-10-CM | POA: Insufficient documentation

## 2012-10-18 ENCOUNTER — Encounter: Payer: Self-pay | Admitting: *Deleted

## 2012-11-01 ENCOUNTER — Other Ambulatory Visit: Payer: Self-pay | Admitting: Internal Medicine

## 2012-12-21 ENCOUNTER — Encounter (INDEPENDENT_AMBULATORY_CARE_PROVIDER_SITE_OTHER): Payer: Self-pay

## 2012-12-21 ENCOUNTER — Ambulatory Visit (HOSPITAL_BASED_OUTPATIENT_CLINIC_OR_DEPARTMENT_OTHER): Payer: Medicare Other | Admitting: Oncology

## 2012-12-21 ENCOUNTER — Other Ambulatory Visit (HOSPITAL_BASED_OUTPATIENT_CLINIC_OR_DEPARTMENT_OTHER): Payer: Medicare Other | Admitting: Lab

## 2012-12-21 VITALS — BP 124/78 | HR 68 | Temp 97.0°F | Resp 18

## 2012-12-21 DIAGNOSIS — D649 Anemia, unspecified: Secondary | ICD-10-CM

## 2012-12-21 DIAGNOSIS — E785 Hyperlipidemia, unspecified: Secondary | ICD-10-CM

## 2012-12-21 DIAGNOSIS — I10 Essential (primary) hypertension: Secondary | ICD-10-CM

## 2012-12-21 DIAGNOSIS — D509 Iron deficiency anemia, unspecified: Secondary | ICD-10-CM

## 2012-12-21 DIAGNOSIS — E119 Type 2 diabetes mellitus without complications: Secondary | ICD-10-CM

## 2012-12-21 DIAGNOSIS — D539 Nutritional anemia, unspecified: Secondary | ICD-10-CM

## 2012-12-21 LAB — COMPREHENSIVE METABOLIC PANEL (CC13)
Albumin: 4 g/dL (ref 3.5–5.0)
BUN: 25.9 mg/dL (ref 7.0–26.0)
CO2: 18 mEq/L — ABNORMAL LOW (ref 22–29)
Calcium: 9.8 mg/dL (ref 8.4–10.4)
Chloride: 107 mEq/L (ref 98–109)
Glucose: 105 mg/dl (ref 70–140)
Potassium: 4.6 mEq/L (ref 3.5–5.1)

## 2012-12-21 LAB — CBC WITH DIFFERENTIAL/PLATELET
Basophils Absolute: 0 10*3/uL (ref 0.0–0.1)
Eosinophils Absolute: 0.1 10*3/uL (ref 0.0–0.5)
HGB: 13.7 g/dL (ref 11.6–15.9)
MCV: 84.7 fL (ref 79.5–101.0)
NEUT#: 4.4 10*3/uL (ref 1.5–6.5)
RDW: 14.1 % (ref 11.2–14.5)
lymph#: 1.2 10*3/uL (ref 0.9–3.3)

## 2012-12-21 NOTE — Progress Notes (Signed)
Hematology and Oncology Follow Up Visit  Jade Ellison [id number] 11/05/1945 67 y.o. 12/21/2012 4:07 PM Jade Ellison, MDAndy, [name], MD   Principle Diagnosis: 67 year old woman with Iron deficiency anemia diagnosed in 2013.   Current therapy: Ferrous sulfate 325 mg PO daily. She was given iron dextran 1300 mg IV on 07/01/11 under the care of her GI doctors.   Interim History:  Jade Ellison is a 67 year old female seen for follow-up today. She is doing well since her last visit, she is still taking iron once a day. Reports fatigue is much better. No chest pain, shortness of breath, dyspnea. No abdominal pain, nausea, or vomiting. Reports that she has belching at times. She has not noticed any bleeding recently. She had not reported any bleeding or any complications related to Fe intake. She has not had any recent hospitalizations or illnesses.  Medications: I have reviewed the patient's current medications.   Current Outpatient Prescriptions  Medication Sig Dispense Refill  . ALPRAZolam (XANAX) 0.25 MG tablet 0.25 mg. Take 1 tablet (0.25 mg total) by mouth at bedtime as needed for Sleep or Anxiety.      Marland Kitchen amLODipine (NORVASC) 5 MG tablet Take 5 mg by mouth daily.       Marland Kitchen aspirin EC 81 MG tablet Take 162 mg by mouth daily.      Marland Kitchen desonide (DESOWEN) 0.05 % cream Apply 0.05 application topically daily. Uses PRN      . diclofenac sodium (VOLTAREN) 1 % GEL Apply 2 g topically 4 (four) times daily.  2 Tube  2  . dicyclomine (BENTYL) 20 MG tablet Take 1 tablet (20 mg total) by mouth 2 (two) times daily.  60 tablet  2  . ferrous sulfate 325 (65 FE) MG tablet Take 325 mg by mouth daily with breakfast.       . FLUoxetine (PROZAC) 20 MG capsule Take 20 mg by mouth daily.       . furosemide (LASIX) 40 MG tablet Take 40 mg by mouth daily.       Marland Kitchen HYDROcodone-acetaminophen (NORCO) 7.5-325 MG per tablet TAKE 1 TABLET BY MOUTH EVERY 8 HOURS AS NEEDED FOR PAIN FOR 30 DAYS  75 tablet  0  . Inulin  (METAMUCIL CLEAR & NATURAL) POWD Take 1 packet by mouth daily.      Marland Kitchen latanoprost (XALATAN) 0.005 % ophthalmic solution Place 1 drop into both eyes daily.       Marland Kitchen losartan (COZAAR) 50 MG tablet Take 50 mg by mouth daily.      . meloxicam (MOBIC) 7.5 MG tablet Take 1 tablet by mouth daily.      . metFORMIN (GLUCOPHAGE) 1000 MG tablet Take 1,000 mg by mouth daily.       . Mouthwashes (BIOTENE/CALCIUM MT) Take 1 tablet by mouth daily. Use as directed 1 tablet in the mouth or throat daily.      . OMEGA 3 1000 MG CAPS Take 1 capsule by mouth daily.       Marland Kitchen omeprazole (PRILOSEC) 20 MG capsule TAKE 1 CAPSULE BY MOUTH EVERY DAY  30 capsule  1  . psyllium (METAMUCIL SMOOTH TEXTURE) 28 % packet Take 1 packet by mouth daily. Take 1 packet by mouth 2 (two) times daily.      . rosuvastatin (CRESTOR) 20 MG tablet Take 20 mg by mouth daily.        . Travoprost, BAK Free, (TRAVATAMN) 0.004 % SOLN ophthalmic solution Place 1 drop into  both eyes at bedtime.       . calcium-vitamin D (OSCAL WITH D) 500-200 MG-UNIT per tablet Take 1 tablet by mouth daily. Take 1 tablet by mouth daily.      . metoprolol tartrate (LOPRESSOR) 25 MG tablet Take 25 mg by mouth daily.       No current facility-administered medications for this visit.    Allergies:  Allergies  Allergen Reactions  . Codeine Rash  . Nsaids     Abdominal Pain    Past Medical History, Surgical history, Social history, and Family History were reviewed and updated.  Review of Systems:  Remaining ROS negative.  Physical Exam: Blood pressure 124/78, pulse 68, temperature 97 F (36.1 C), temperature source Oral, resp. rate 18, SpO2 97.00%. ECOG: 2 General appearance: alert, cooperative and no distress Head: Normocephalic, without obvious abnormality, atraumatic Neck: no adenopathy, no carotid bruit, no JVD, supple, symmetrical, trachea midline and thyroid not enlarged, symmetric, no tenderness/mass/nodules Lymph nodes: Cervical, supraclavicular,  and axillary nodes normal. Heart:regular rate and rhythm, S1, S2 normal, no murmur, click, rub or gallop Lung:chest clear, no wheezing, rales, normal symmetric air entry, no tachypnea, retractions or cyanosis Abdomen: soft, non-tender, without masses or organomegaly EXT:no erythema, induration, or nodules  Lab Results: Lab Results  Component Value Date   WBC 6.2 12/21/2012   HGB 13.7 12/21/2012   HCT 42.6 12/21/2012   MCV 84.7 12/21/2012   PLT 164 12/21/2012     Chemistry      Component Value Date/Time   NA 137 02/08/2012 0105   K 4.0 02/08/2012 0105   CL 99 02/08/2012 0105   CO2 28 02/08/2012 0105   BUN 11 02/08/2012 0105   CREATININE 0.85 02/08/2012 0105      Component Value Date/Time   CALCIUM 9.2 02/08/2012 0105   ALKPHOS 76 02/08/2012 0105   AST 13 02/08/2012 0105   ALT 7 02/08/2012 0105   BILITOT 0.7 02/08/2012 0105     Impression and Plan:  This is a 67 year old female with the following issues: 1. Iron deficiency anemia. GI work-up is unrevealing. Hemoglobin has now corrected with oral iron and IV iron. Recommend that she continue oral iron once a day. Will continue to monitor her labs. She may need additional IV iron in the future. 2. DM. Continue Metformin and Actos per PCP. 3. Hyperlipidemia. Continue Crestor per PCP. 4. HTN. Continue Norvasc, Lotensin, Lasix, and Benicar per PCP. 5. Follow-up. In about 6 months to monitor labs.     Jade Ellison 12/9/20144:07 PM

## 2012-12-22 ENCOUNTER — Telehealth: Payer: Self-pay | Admitting: Oncology

## 2012-12-22 LAB — IRON AND TIBC CHCC
%SAT: 18 % — ABNORMAL LOW (ref 21–57)
UIBC: 249 ug/dL (ref 120–384)

## 2012-12-22 NOTE — Telephone Encounter (Signed)
S/w the pt and she is aware of her June 2015 appts. °

## 2012-12-24 ENCOUNTER — Ambulatory Visit: Payer: Medicare Other | Admitting: Internal Medicine

## 2013-01-07 IMAGING — US US ABDOMEN COMPLETE
1 series · 14 of 25 positions shown · non-contrast
Comparison: Renal ultrasound - 02/15/18 10; renal stone CT -
03/20/2008

CLINICAL DATA: Dyspepsia, diarrhea

ABDOMINAL ULTRASOUND COMPLETE

[Series 1: us abdomen complete · 0.37mm/px · 14 of 76 slices shown]
[im 1/76]
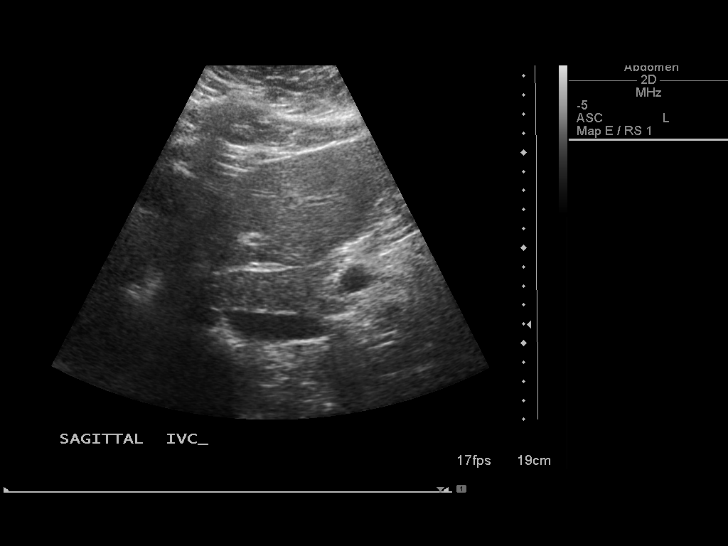
[im 7/76]
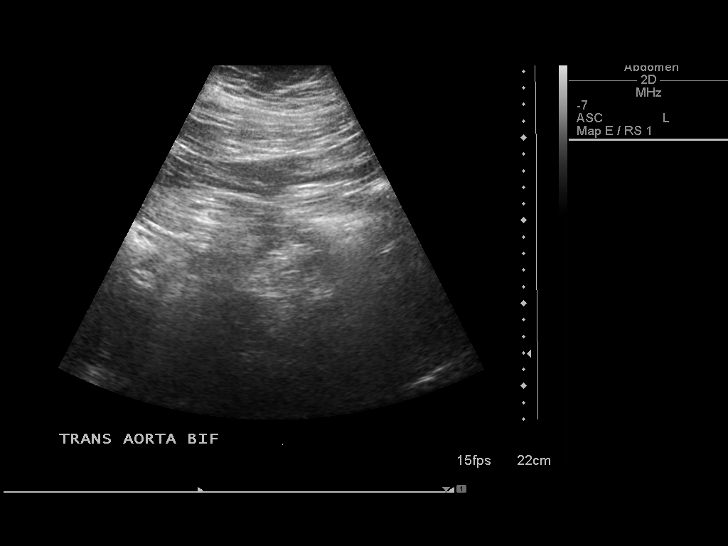
[im 13/76]
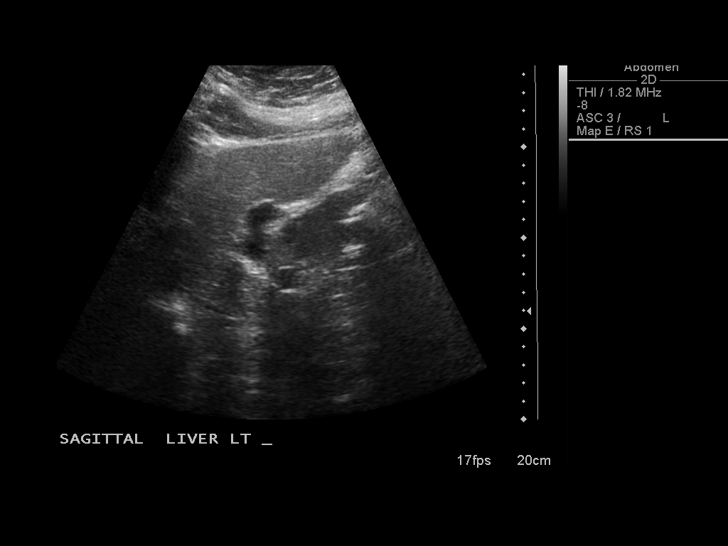
[im 19/76]
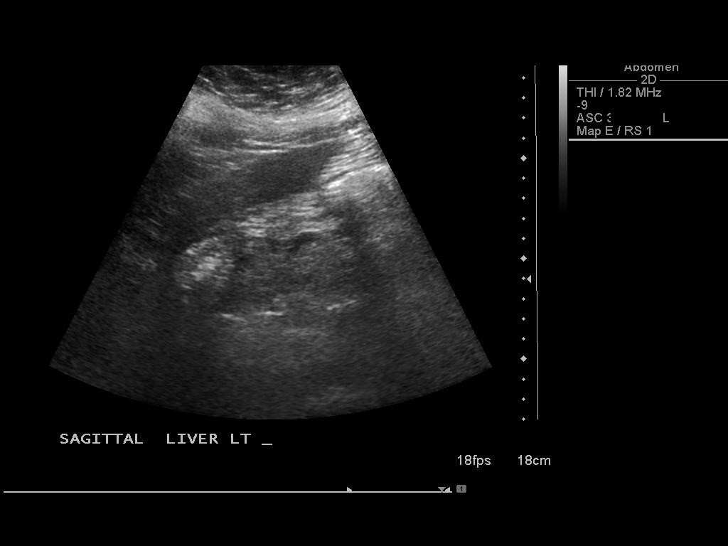
[im 26/76]
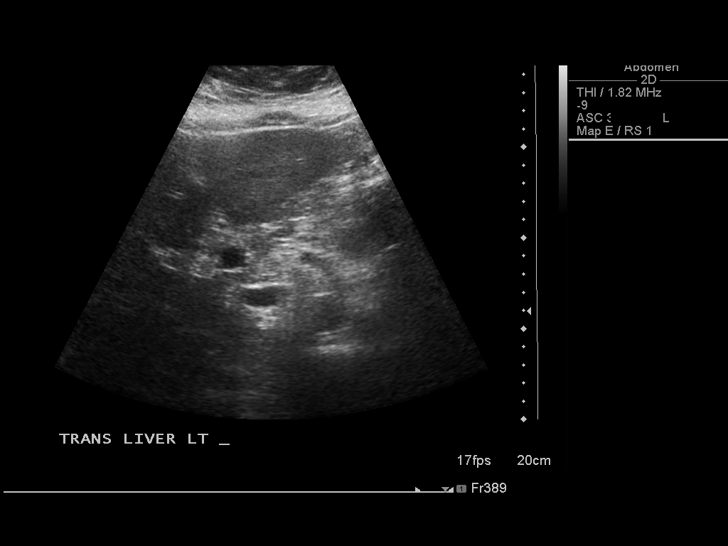
[im 29/76]
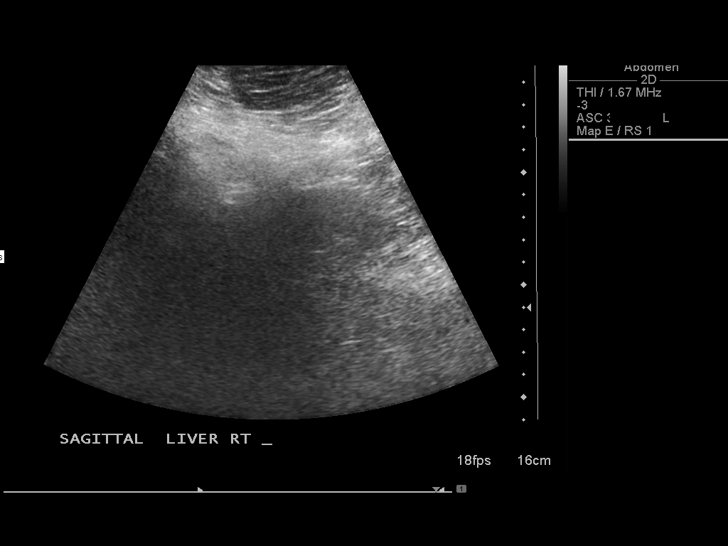
[im 35/76]
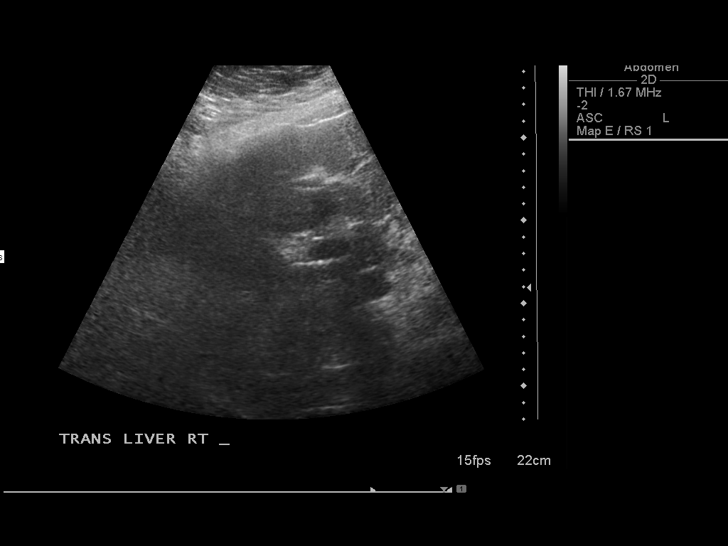
[im 41/76]
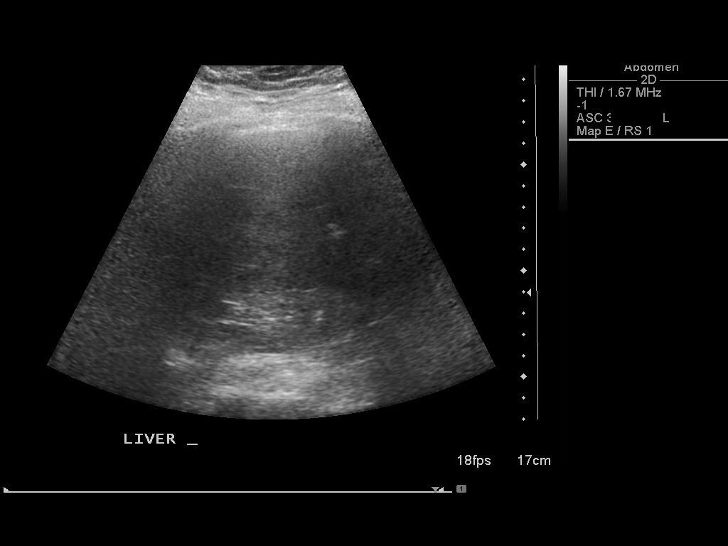
[im 47/76]
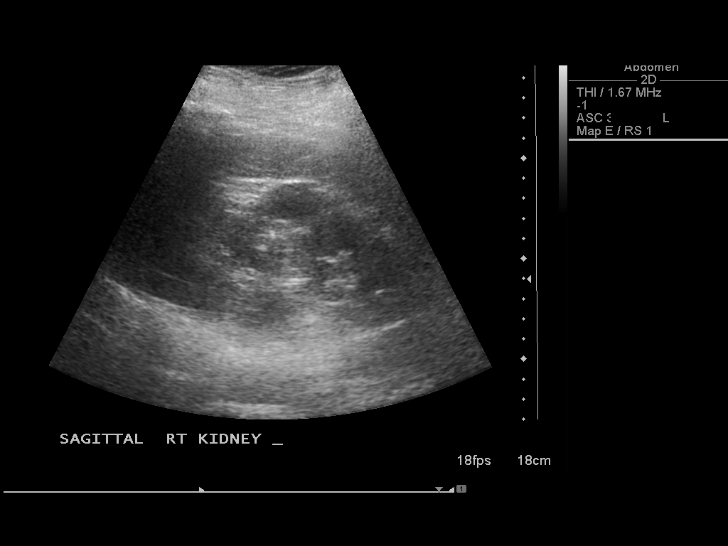
[im 51/76]
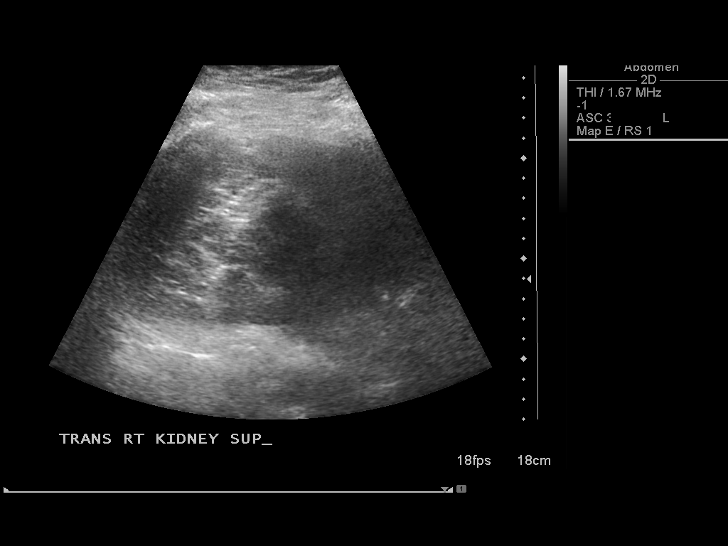
[im 57/76]
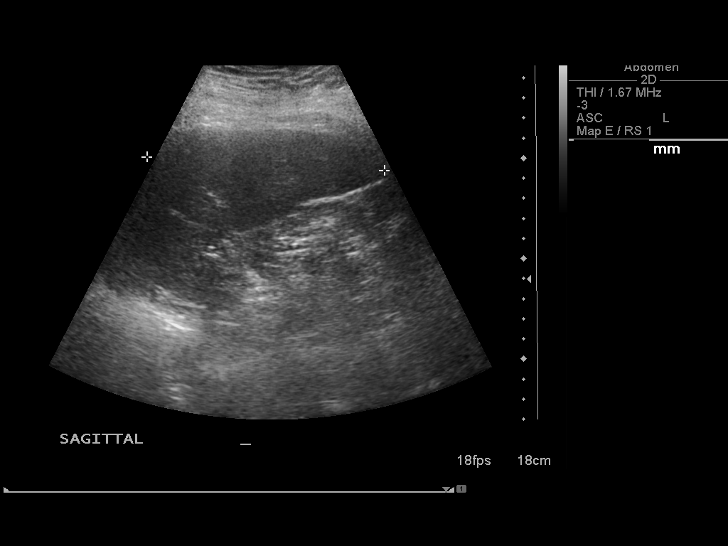
[im 63/76]
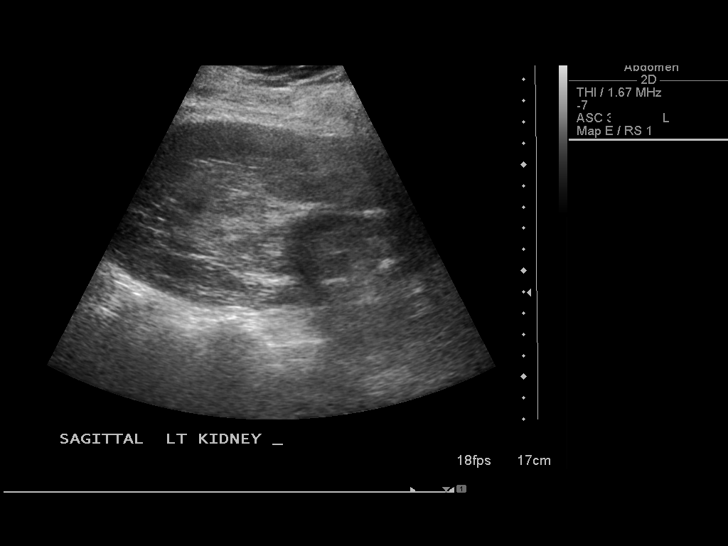
[im 69/76]
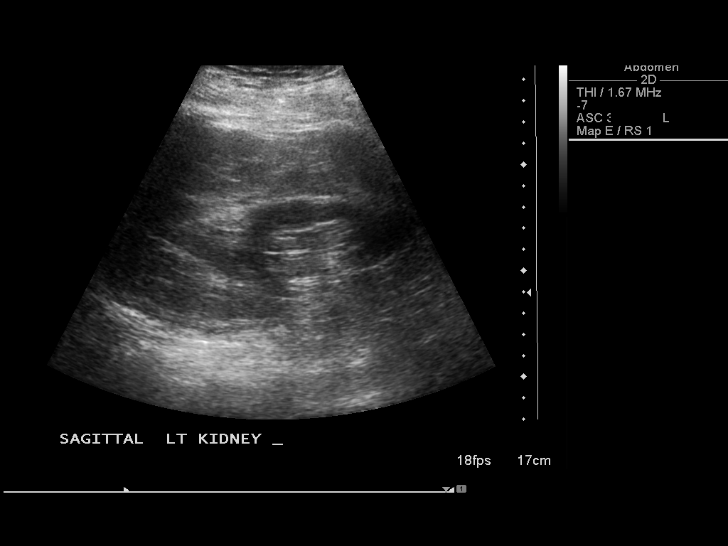
[im 76/76]
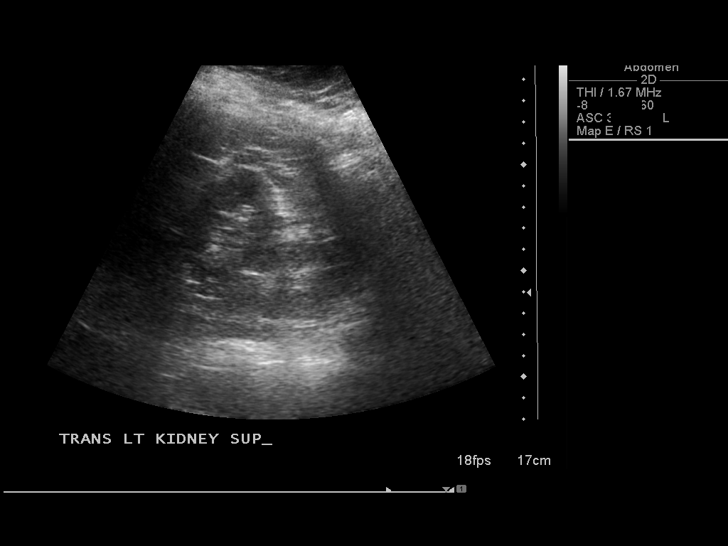

[14 of 25 positions shown; findings below may reference images not displayed]

FINDINGS: Gallbladder:  Surgically absent

Common Bile Duct:  Normal in size, measuring 5.5 mm in diameter

Liver: Homogeneous hepatic parenchymal echogenicity.  No discrete
hepatic lesions.  No ascites.

IVC:  Appears normal.

Pancreas:  Limited visualization secondary to patient body habitus
and bowel gas.

Spleen:  Normal in size, measuring 12 cm in length (volume
cc)

Right kidney:  Suboptimally visualized secondary to patient body
habitus.  Normal in size, measuring 10 cm in length.  Normal
cortical thickness and echogenicity.  No discrete renal lesions.
No urinary obstruction.

Left kidney:  Suboptimally visualized secondary to patient body
habitus.  Normal in size, measuring 13.6 cm in length.  Normal
cortical echogenicity and thickness.  No discrete renal lesions.
No urinary obstruction.

Abdominal Aorta:  No aneurysm identified.
IMPRESSION: 1.  Minimally limited examination secondary to patient body
habitus.  No sonographic explanation for patient's dyspepsia and
diarrhea.
2.  Post cholecystectomy.

## 2013-01-10 ENCOUNTER — Other Ambulatory Visit: Payer: Self-pay | Admitting: Internal Medicine

## 2013-02-15 ENCOUNTER — Telehealth: Payer: Self-pay | Admitting: Oncology

## 2013-02-15 NOTE — Telephone Encounter (Signed)
, °

## 2013-02-24 ENCOUNTER — Other Ambulatory Visit: Payer: Self-pay | Admitting: Internal Medicine

## 2013-03-16 ENCOUNTER — Other Ambulatory Visit: Payer: Self-pay | Admitting: Internal Medicine

## 2013-04-26 ENCOUNTER — Ambulatory Visit: Payer: Medicare Other | Admitting: Cardiovascular Disease

## 2013-05-24 ENCOUNTER — Encounter: Payer: Self-pay | Admitting: Cardiovascular Disease

## 2013-05-24 ENCOUNTER — Ambulatory Visit (INDEPENDENT_AMBULATORY_CARE_PROVIDER_SITE_OTHER): Payer: Medicare Other | Admitting: Cardiovascular Disease

## 2013-05-24 VITALS — BP 149/73 | HR 75 | Ht 63.0 in | Wt 283.0 lb

## 2013-05-24 DIAGNOSIS — I10 Essential (primary) hypertension: Secondary | ICD-10-CM

## 2013-05-24 DIAGNOSIS — G4730 Sleep apnea, unspecified: Secondary | ICD-10-CM

## 2013-05-24 DIAGNOSIS — E119 Type 2 diabetes mellitus without complications: Secondary | ICD-10-CM

## 2013-05-24 DIAGNOSIS — E785 Hyperlipidemia, unspecified: Secondary | ICD-10-CM | POA: Insufficient documentation

## 2013-05-24 NOTE — Patient Instructions (Signed)
Your physician wants you to follow-up in: 1 year with Dr Berry. You will receive a reminder letter in the mail two months in advance. If you don't receive a letter, please call our office to schedule the follow-up appointment.  

## 2013-05-24 NOTE — Assessment & Plan Note (Signed)
Recent lab work revealed total cholesterol 168, LDL of 84 and HDL of 68. She is on a statin drug.

## 2013-05-24 NOTE — Progress Notes (Signed)
05/24/2013 Jade Ellison   [birth year]  [id number]  Primary Physician Leamon Arnt, MD Primary Cardiologist: Lorretta Harp MD Renae Gloss   HPI:  The patient is a 68 year old, moderate to severely overweight, widowed [name], mother of two, grandmother to one grandchild, who is accompanied by one of her daughters today. I saw her six months ago. She has a history of normal coronary arteries and normal LV function by cath performed by Dr. Jenkins Rouge in 2003. Her other problems include obstructive sleep apnea currently weighing CPAP, hypertension, hyperlipidemia, and type 2 diabetes. She is otherwise asymptomatic. She was gained 20 pounds since I saw her last. Her most recent lipid profile performed 04/12/13 revealed a total cholesterol 168, LDL 84 and HDL of 68.     Current Outpatient Prescriptions  Medication Sig Dispense Refill  . ALPRAZolam (XANAX) 0.25 MG tablet 0.25 mg. Take 1 tablet (0.25 mg total) by mouth at bedtime as needed for Sleep or Anxiety.      Marland Kitchen aspirin EC 81 MG tablet Take 162 mg by mouth daily.      Marland Kitchen desonide (DESOWEN) 0.05 % cream Apply 8.18 application topically daily. Uses PRN      . diclofenac sodium (VOLTAREN) 1 % GEL Apply 2 g topically 4 (four) times daily.  2 Tube  2  . furosemide (LASIX) 40 MG tablet Take 40 mg by mouth as needed.       . latanoprost (XALATAN) 0.005 % ophthalmic solution Place 1 drop into both eyes daily.       Marland Kitchen losartan (COZAAR) 100 MG tablet Take 100 mg by mouth daily.      . metFORMIN (GLUCOPHAGE) 1000 MG tablet Take 1,000 mg by mouth daily.       . Mouthwashes (BIOTENE/CALCIUM MT) Take 1 tablet by mouth daily. Use as directed 1 tablet in the mouth or throat daily.      . OMEGA 3 1000 MG CAPS Take 1 capsule by mouth daily.       . psyllium (METAMUCIL SMOOTH TEXTURE) 28 % packet Take 1 packet by mouth daily. Take 1 packet by mouth 2 (two) times daily.      . rosuvastatin (CRESTOR) 20 MG tablet Take 20  mg by mouth daily.        . calcium-vitamin D (OSCAL WITH D) 500-200 MG-UNIT per tablet Take 1 tablet by mouth daily. Take 1 tablet by mouth daily.      . metoprolol tartrate (LOPRESSOR) 25 MG tablet Take 25 mg by mouth daily.       No current facility-administered medications for this visit.    Allergies  Allergen Reactions  . Codeine Rash  . Nsaids     Abdominal Pain    History   Social History  . Marital Status: Widowed    Spouse Name: N/A    Number of Children: 1  . Years of Education: N/A   Occupational History  . RETIRED    Social History Main Topics  . Smoking status: Never Smoker   . Smokeless tobacco: Never Used  . Alcohol Use: 0.6 oz/week    1 Glasses of wine per week     Comment: wine sometimes on holidays and bedtime  . Drug Use: No  . Sexual Activity: Not on file   Other Topics Concern  . Not on file   Social History Narrative  . No narrative on file     Review of Systems: General: negative for chills,  fever, night sweats or weight changes.  Cardiovascular: negative for chest pain, dyspnea on exertion, edema, orthopnea, palpitations, paroxysmal nocturnal dyspnea or shortness of breath Dermatological: negative for rash Respiratory: negative for cough or wheezing Urologic: negative for hematuria Abdominal: negative for nausea, vomiting, diarrhea, bright red blood per rectum, melena, or hematemesis Neurologic: negative for visual changes, syncope, or dizziness All other systems reviewed and are otherwise negative except as noted above.    Blood pressure 149/73, pulse 75, height 5\' 3"  (1.6 m), weight 283 lb (128.368 kg).  General appearance: alert and no distress Neck: no adenopathy, no carotid bruit, no JVD, supple, symmetrical, trachea midline and thyroid not enlarged, symmetric, no tenderness/mass/nodules Lungs: clear to auscultation bilaterally Heart: regular rate and rhythm, S1, S2 normal, no murmur, click, rub or gallop Extremities: extremities  normal, atraumatic, no cyanosis or edema  EKG normal sinus rhythm at 79 with occasional PVCs  ASSESSMENT AND PLAN:   SLEEP APNEA Uses a CPAP  HYPERTENSION Controlled on current medications  Hyperlipidemia Recent lab work revealed total cholesterol 168, LDL of 84 and HDL of 68. She is on a statin drug.      Lorretta Harp MD FACP,FACC,FAHA, [facility] 05/24/2013 4:21 PM

## 2013-05-24 NOTE — Assessment & Plan Note (Addendum)
Uses a CPAP 

## 2013-05-24 NOTE — Assessment & Plan Note (Signed)
Controlled on current medications 

## 2013-06-28 ENCOUNTER — Other Ambulatory Visit: Payer: Medicare Other

## 2013-06-28 ENCOUNTER — Ambulatory Visit: Payer: Medicare Other | Admitting: Oncology

## 2013-06-29 ENCOUNTER — Ambulatory Visit: Payer: Medicare Other | Admitting: Oncology

## 2013-06-29 ENCOUNTER — Other Ambulatory Visit: Payer: Medicare Other

## 2013-08-30 ENCOUNTER — Encounter: Payer: Self-pay | Admitting: Internal Medicine

## 2014-01-25 ENCOUNTER — Encounter: Payer: Self-pay | Admitting: Internal Medicine

## 2014-02-17 ENCOUNTER — Encounter: Payer: Self-pay | Admitting: Internal Medicine

## 2014-03-01 ENCOUNTER — Ambulatory Visit (AMBULATORY_SURGERY_CENTER): Payer: Self-pay

## 2014-03-01 ENCOUNTER — Telehealth: Payer: Self-pay

## 2014-03-01 VITALS — Ht 63.0 in | Wt 283.0 lb

## 2014-03-01 DIAGNOSIS — K2270 Barrett's esophagus without dysplasia: Secondary | ICD-10-CM

## 2014-03-01 NOTE — Progress Notes (Signed)
No home oxygen No past problems with anesthesia No allergies to eggs or soy No diet/weight loss meds  Has email  Emmi instructions given for endoscopy

## 2014-03-01 NOTE — Telephone Encounter (Signed)
Needs hospital appt .  Wheelchair bound and bmi>50.

## 2014-03-02 ENCOUNTER — Encounter: Payer: Self-pay | Admitting: *Deleted

## 2014-03-02 ENCOUNTER — Other Ambulatory Visit: Payer: Self-pay | Admitting: *Deleted

## 2014-03-02 DIAGNOSIS — K2270 Barrett's esophagus without dysplasia: Secondary | ICD-10-CM

## 2014-03-02 NOTE — Telephone Encounter (Signed)
(  After previsit realized) Pt is not even able to pivot by herself.  It took her daughter and I to get her to the scale and she almost fell(she was shaking the whole time while standing).  I'll forward to [facility] to get hospital appt.

## 2014-03-02 NOTE — Telephone Encounter (Signed)
On pre visit it was discovered that patient is wheelchair bound and has BMI>50. Does she need and OV or just set up at hospital for recall EGD. Please, advise.

## 2014-03-02 NOTE — Telephone Encounter (Signed)
Scheduled on April 19, 2014 at 10:30 AM at [facility] endo(Jill).

## 2014-03-02 NOTE — Telephone Encounter (Signed)
Patient given procedure date time and new instructions mailed to patient.

## 2014-03-02 NOTE — Telephone Encounter (Signed)
Last BMI was 50.14 in May 2015 ( OV DR Gwenlyn Found), it is still OK for [place]. Please find out if pt  Ambulates at home , sometimes people just  Find it easier to use wheelchair but are able to transfer from chair to stretcher.. If able to get up and walk then schedule direct   in [place].  Direct EGD is OK. Other wise [facility] direct EGD, conscious sedation.

## 2014-03-15 ENCOUNTER — Encounter: Payer: Medicare Other | Admitting: Internal Medicine

## 2014-04-18 NOTE — H&P (Signed)
  Jade Ellison Jan 21, 1945 MRN [id number]  History of Present Illness:  This is a 69 year old African American female with a new diagnosis of duodenal AV malformation found on a small bowel capsule endoscopy. Her upper endoscopy in October 2012 showed gastritis and Barrett's esophagus. Her colonoscopy was negative. Her initial hemoglobin was 9.4 with an MCV of 69. She received an iron infusion and has been on iron supplements with good response in her hemoglobin which came up to 13.5 and most recently 12.7 but her MCV is still low at 74. She denies abdominal pain, melena, nausea or dysphagia. Her level of energy has improved.   Past Medical History  Diagnosis Date  . Colon polyp     polypoid colorectal mucosa  . Diverticulosis   . Depression   . Nephrolithiasis   . Sleep apnea   . Hypertension   . Obesity   . Diabetes mellitus   . Hemorrhoids   . Hernia of unspecified site of abdominal cavity without mention of obstruction or gangrene   . Arthritis   . Anemia   . Gastritis   . Esophagitis   . Barrett esophagus 10/14/10   Past Surgical History  Procedure Date  . Left sided lithotripsy   . Cholecystectomy   . Knee arthroscopy   . Cardiac catheterization   . Hernia repair   . Tubal ligation   . Cesarean section 1986    reports that she has never smoked. She has never used smokeless tobacco. She reports that she drinks about .6 ounces of alcohol per week. She reports that she does not use illicit drugs. family history includes Diabetes in her brother and sister; Heart disease in her father and mother; and Kidney cancer in her brother. Allergies  Allergen Reactions  . Codeine Rash  . Nsaids     Abdominal Pain       Review of Systems: Negative for dysphagia odynophagia. Weight stable The remainder of the 10 point ROS is negative except as outlined in H&P   Physical Exam: General  appearance Well developed, in no distress.. Skin no lesions. Neurological alert and oriented x 3. Psychological normal mood and affect.  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 She may possibly have multiple AVMs in the small bowel which were not visualized on the small bowel capsule endoscopy. She also has Barrett's esophagus which needs to be rechecked endoscopically. We will go ahead with upper endoscopy/enteroscopy with plans for argon laser ablation of the AVM if it is within the reach of the enteroscope and we will repeat biopsies for Barrett's. She will remain on Prilosec 20 mg daily. We will recheck her iron studies as well as B12 and sprue profile and hemoglobin today. She will continue iron supplements. She was told to reduce her iron to one a day.   12/30/2011 Jade Ellison           Revision History       Date/Time User Action    > 12/30/2011 12:54 PM Lafayette Dragon, MD Sign     12/30/2011 11:39 AM Larina Bras, CMA Sign at close encounter     12/30/2011 11:19 AM Lafayette Dragon, MD Sign at close encounter               Psych Notes

## 2014-04-19 ENCOUNTER — Ambulatory Visit (HOSPITAL_COMMUNITY)
Admission: RE | Admit: 2014-04-19 | Discharge: 2014-04-19 | Disposition: A | Discharge status: Home / Self Care | Payer: Medicare Other | Source: Ambulatory Visit | Attending: Internal Medicine | Admitting: Internal Medicine

## 2014-04-19 ENCOUNTER — Encounter (HOSPITAL_COMMUNITY)
Admission: RE | Disposition: A | Discharge status: Home / Self Care | Payer: Self-pay | Source: Ambulatory Visit | Attending: Internal Medicine

## 2014-04-19 ENCOUNTER — Encounter (HOSPITAL_COMMUNITY): Payer: Self-pay | Admitting: *Deleted

## 2014-04-19 DIAGNOSIS — K2950 Unspecified chronic gastritis without bleeding: Secondary | ICD-10-CM | POA: Diagnosis not present

## 2014-04-19 DIAGNOSIS — Z6841 Body Mass Index (BMI) 40.0 and over, adult: Secondary | ICD-10-CM | POA: Insufficient documentation

## 2014-04-19 DIAGNOSIS — K2270 Barrett's esophagus without dysplasia: Secondary | ICD-10-CM | POA: Diagnosis not present

## 2014-04-19 DIAGNOSIS — K31811 Angiodysplasia of stomach and duodenum with bleeding: Secondary | ICD-10-CM | POA: Insufficient documentation

## 2014-04-19 DIAGNOSIS — I10 Essential (primary) hypertension: Secondary | ICD-10-CM | POA: Insufficient documentation

## 2014-04-19 DIAGNOSIS — Z86010 Personal history of colonic polyps: Secondary | ICD-10-CM | POA: Diagnosis not present

## 2014-04-19 DIAGNOSIS — G4730 Sleep apnea, unspecified: Secondary | ICD-10-CM | POA: Diagnosis not present

## 2014-04-19 DIAGNOSIS — E119 Type 2 diabetes mellitus without complications: Secondary | ICD-10-CM | POA: Insufficient documentation

## 2014-04-19 DIAGNOSIS — E669 Obesity, unspecified: Secondary | ICD-10-CM | POA: Diagnosis not present

## 2014-04-19 DIAGNOSIS — Z9049 Acquired absence of other specified parts of digestive tract: Secondary | ICD-10-CM | POA: Insufficient documentation

## 2014-04-19 DIAGNOSIS — R1013 Epigastric pain: Secondary | ICD-10-CM | POA: Diagnosis present

## 2014-04-19 DIAGNOSIS — K209 Esophagitis, unspecified: Secondary | ICD-10-CM | POA: Insufficient documentation

## 2014-04-19 DIAGNOSIS — M1990 Unspecified osteoarthritis, unspecified site: Secondary | ICD-10-CM | POA: Diagnosis not present

## 2014-04-19 HISTORY — PX: ESOPHAGOGASTRODUODENOSCOPY: SHX5428

## 2014-04-19 LAB — GLUCOSE, CAPILLARY: Glucose-Capillary: 108 mg/dL — ABNORMAL HIGH (ref 70–99)

## 2014-04-19 SURGERY — EGD (ESOPHAGOGASTRODUODENOSCOPY)
Anesthesia: Moderate Sedation

## 2014-04-19 MED ORDER — DIPHENHYDRAMINE HCL 50 MG/ML IJ SOLN
INTRAMUSCULAR | Status: AC
  Filled 2014-04-19: qty 1

## 2014-04-19 MED ORDER — DIPHENHYDRAMINE HCL 50 MG/ML IJ SOLN
INTRAMUSCULAR | Status: DC | PRN
  Administered 2014-04-19: 25 mg via INTRAVENOUS

## 2014-04-19 MED ORDER — FENTANYL CITRATE 0.05 MG/ML IJ SOLN
INTRAMUSCULAR | Status: AC
  Filled 2014-04-19: qty 2

## 2014-04-19 MED ORDER — BUTAMBEN-TETRACAINE-BENZOCAINE 2-2-14 % EX AERO
INHALATION_SPRAY | CUTANEOUS | Status: DC | PRN
  Administered 2014-04-19: 2 via TOPICAL

## 2014-04-19 MED ORDER — FENTANYL CITRATE 0.05 MG/ML IJ SOLN
INTRAMUSCULAR | Status: DC | PRN
  Administered 2014-04-19 (×2): 25 ug via INTRAVENOUS

## 2014-04-19 MED ORDER — SODIUM CHLORIDE 0.9 % IV SOLN
INTRAVENOUS | Status: DC
  Administered 2014-04-19: 500 mL via INTRAVENOUS

## 2014-04-19 MED ORDER — DICYCLOMINE HCL 20 MG PO TABS: 20.0000 mg | ORAL_TABLET | Freq: Two times a day (BID) | ORAL | Status: DC

## 2014-04-19 MED ORDER — MIDAZOLAM HCL 10 MG/2ML IJ SOLN
INTRAMUSCULAR | Status: AC
  Filled 2014-04-19: qty 2

## 2014-04-19 MED ORDER — MIDAZOLAM HCL 10 MG/2ML IJ SOLN
INTRAMUSCULAR | Status: DC | PRN
  Administered 2014-04-19: 1 mg via INTRAVENOUS
  Administered 2014-04-19: 2 mg via INTRAVENOUS
  Administered 2014-04-19: 1 mg via INTRAVENOUS
  Administered 2014-04-19: 2 mg via INTRAVENOUS

## 2014-04-19 NOTE — Interval H&P Note (Signed)
History and Physical Interval Note:  04/19/2014 10:43 AM  Jade Ellison  has presented today for surgery, with the diagnosis of recall EGD, wheelchair bound  The various methods of treatment have been discussed with the patient and family. After consideration of risks, benefits and other options for treatment, the patient has consented to  Procedure(s): ESOPHAGOGASTRODUODENOSCOPY (EGD) (N/A) as a surgical intervention .  The patient's history has been reviewed, patient examined, no change in status, stable for surgery.  I have reviewed the patient's chart and labs.  Questions were answered to the patient's satisfaction.     Delfin Edis

## 2014-04-19 NOTE — Discharge Instructions (Signed)
Esophagogastroduodenoscopy °Care After °Refer to this sheet in the next few weeks. These instructions provide you with information on caring for yourself after your procedure. Your caregiver may also give you more specific instructions. Your treatment has been planned according to current medical practices, but problems sometimes occur. Call your caregiver if you have any problems or questions after your procedure.  °HOME CARE INSTRUCTIONS °· Do not eat or drink anything until the numbing medicine (local anesthetic) has worn off and your gag reflex has returned. You will know that the local anesthetic has worn off when you can swallow comfortably. °· Do not drive for 12 hours after the procedure or as directed by your caregiver. °· Only take medicines as directed by your caregiver. °SEEK MEDICAL CARE IF:  °· You cannot stop coughing. °· You are not urinating at all or less than usual. °SEEK IMMEDIATE MEDICAL CARE IF: °· You have difficulty swallowing. °· You cannot eat or drink. °· You have worsening throat or chest pain. °· You have dizziness, lightheadedness, or you faint. °· You have nausea or vomiting. °· You have chills. °· You have a fever. °· You have severe abdominal pain. °· You have black, tarry, or bloody stools. °Document Released: 12/17/2011 Document Reviewed: 12/17/2011 °ExitCare® Patient Information ©2015 [employer]. This information is not intended to replace advice given to you by your health care provider. Make sure you discuss any questions you have with your health care provider. ° °

## 2014-04-19 NOTE — Op Note (Signed)
[facility] [place], [address]   ENDOSCOPY PROCEDURE REPORT  PATIENT: Jade Ellison, Jade Ellison  MR#: [id number] BIRTHDATE: Feb 25, 1945 , 69  yrs. old GENDER: female ENDOSCOPIST: Lafayette Dragon, MD REFERRED BY:  Rachell Cipro, M.D. PROCEDURE DATE:  04/19/2014 PROCEDURE:  EGD w/ biopsy ASA CLASS:     Class III INDICATIONS:  history of Barrett's esophagus in 2011 and 2012. 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  Patient is having dyspepsia.  PPIs not effective.  Rule out atrophic gastritis. MEDICATIONS: Benadryl 25 mg IV, Fentanyl 50 mcg IV, and Versed 6 mg IV TOPICAL ANESTHETIC: Cetacaine Spray  DESCRIPTION OF PROCEDURE: After the risks benefits and alternatives of the procedure were thoroughly explained, informed consent was obtained.  The Pentax Gastroscope [id number] endoscope was introduced through the mouth and advanced to the second portion of the duodenum , Without limitations.  The instrument was slowly withdrawn as the mucosa was fully examined.    Esophagus: esophageal mucosa appeared normal throughout the proximal, mid and distal esophagus. Squamocolumnar junction was regular. Multiple biopsies were obtained to follow up on Barrett's esophagus  Stomach: stomach was insufflated with air. There was no significant hiatal hernia. Gastric folds were diminished. Multiple biopsies were obtained from the gastric body to rule out intestinal metaplasia or atrophic gastritis. Gastric outlet was normal. Retroflexion of the endoscope confirmed absence of rugal folds  Duodenum: duodenal bulb and descending duodenum was normal. Biopsies were taken from second portion of duodenum to rule out sprue.[ The scope was then withdrawn from the patient and the procedure completed.  COMPLICATIONS: There were no immediate complications.  ENDOSCOPIC IMPRESSION: 1. history of Barrett's esophagus. Biopsies from the GE junction pending 2. History of  intestinal metaplasia in gastric body. Rule out atrophic gastritis. Biopsies pending 3. Chronic diarrhea. Status post biopsies from second portion of duodenum  RECOMMENDATIONS: 1. discontinue Prilosec 2.Bentyl 20 mg po bid 3. Gaviscon 1-2 tabs q 4 hours prn dyspepsia 4.await path report 5. Recall endoscopy pending path report  REPEAT EXAM:  eSigned:  Lafayette Dragon, MD 04/19/2014 11:31 AM    CC:  PATIENT NAME:  [name], [name] MR#: [id number]

## 2014-04-20 ENCOUNTER — Encounter (HOSPITAL_COMMUNITY): Payer: Self-pay | Admitting: Internal Medicine

## 2014-04-21 ENCOUNTER — Encounter: Payer: Self-pay | Admitting: Internal Medicine

## 2014-05-14 IMAGING — CT CT HEAD W/O CM
1 of 2 series · 13 of 30 positions shown, 17 images · non-contrast
Comparison: 09/19/2011 MR and CT.

CLINICAL DATA: Headache for several days.  Vomiting.  Chills.

CT HEAD WITHOUT CONTRAST
TECHNIQUE: Contiguous axial images were obtained from the base of
the skull through the vertex without contrast.

[Series 2: brain · axial · 0.47mm/px · z∈[+70,+204]mm · 13 of 36 slices shown, 17 images]
[im 3/36  brain]
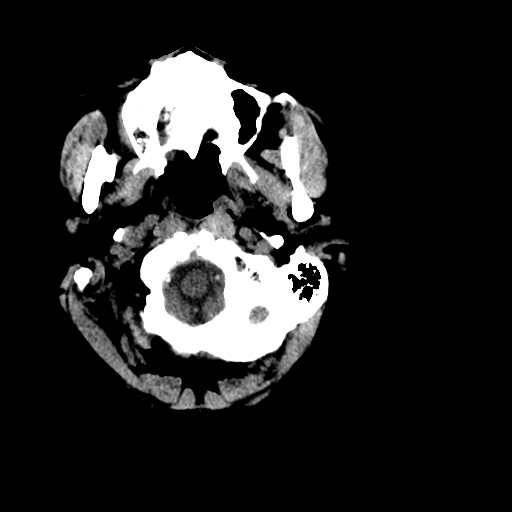
[im 3/36  bone]
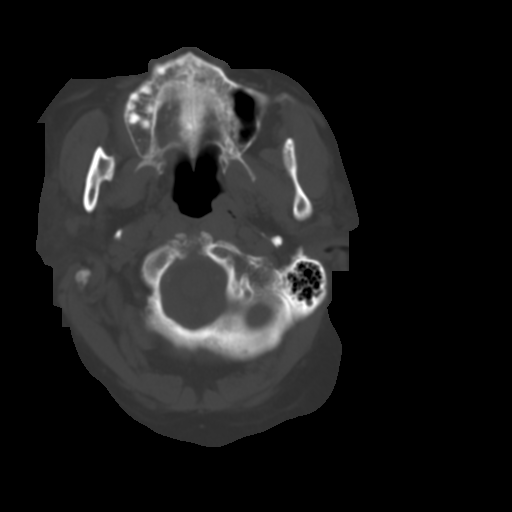
[im 6/36  brain]
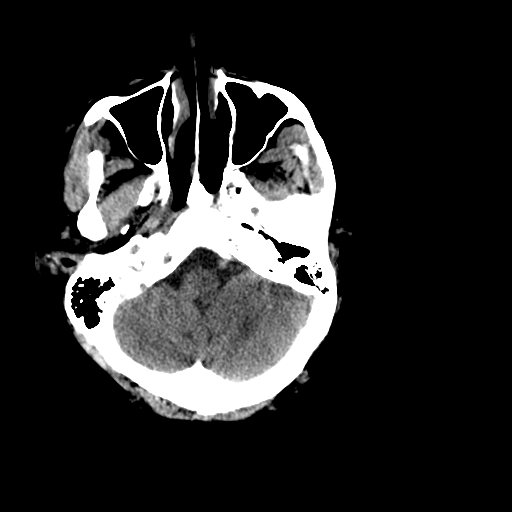
[im 8/36  brain]
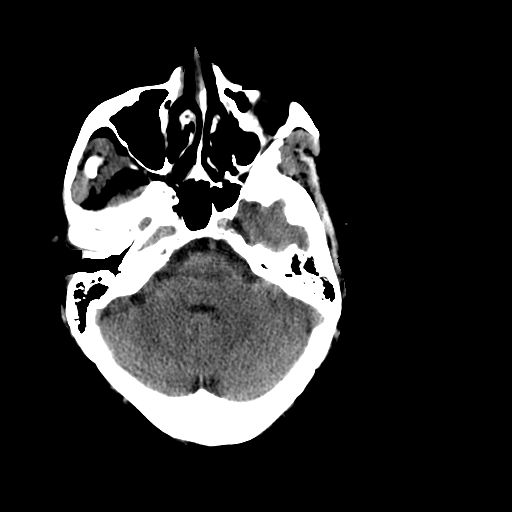
[im 11/36  brain]
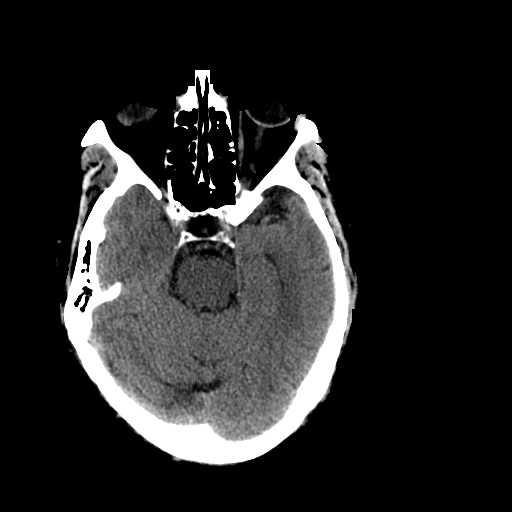
[im 13/36  brain]
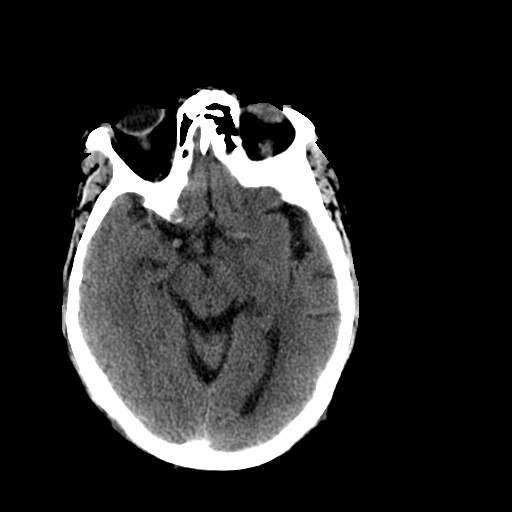
[im 13/36  bone]
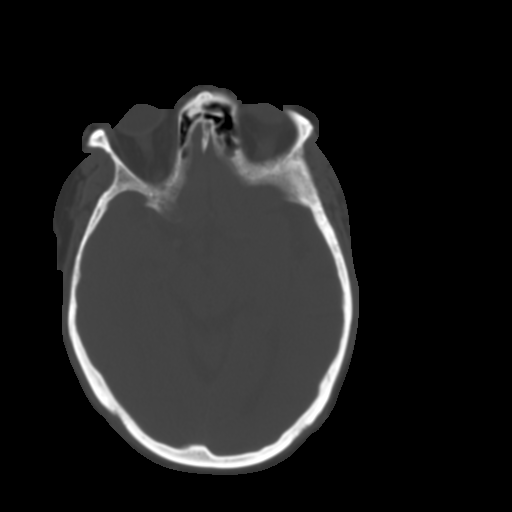
[im 16/36  brain]
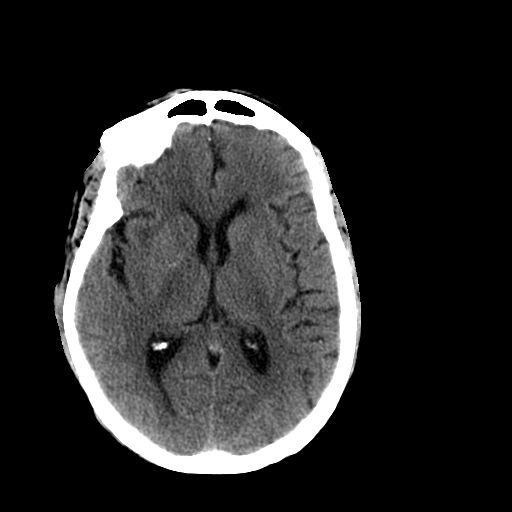
[im 18/36  brain]
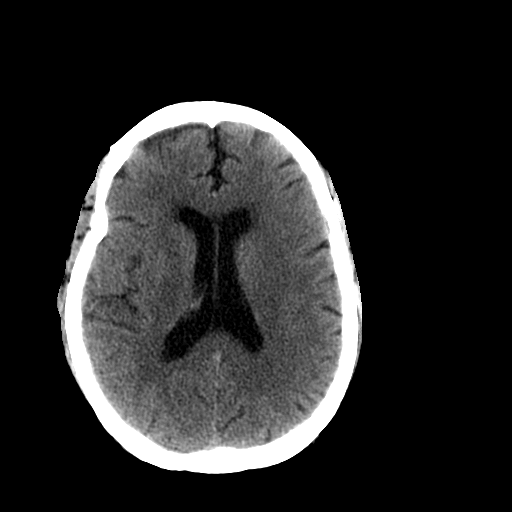
[im 21/36  brain]
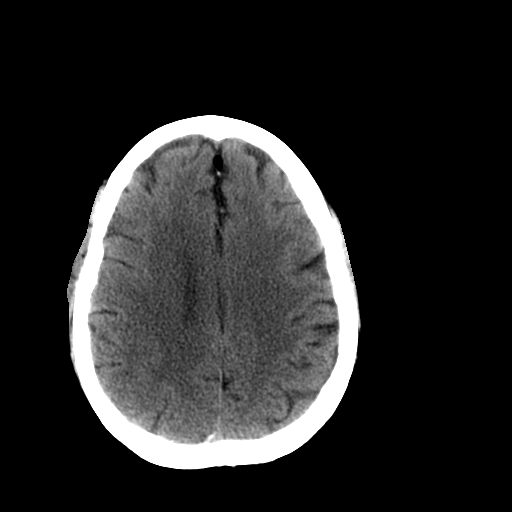
[im 23/36  brain]
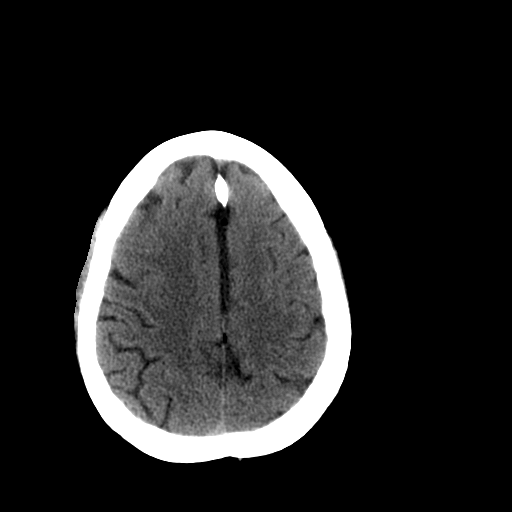
[im 23/36  bone]
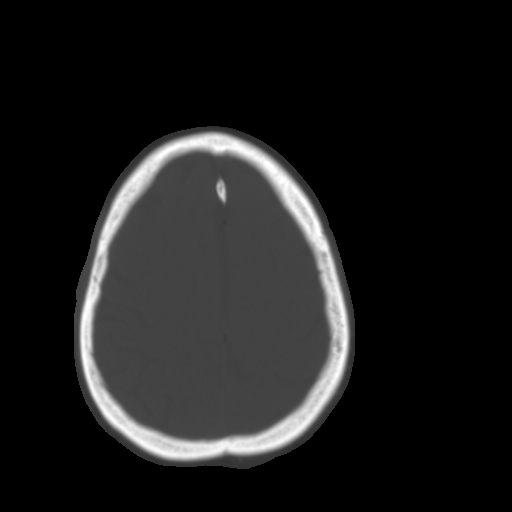
[im 26/36  brain]
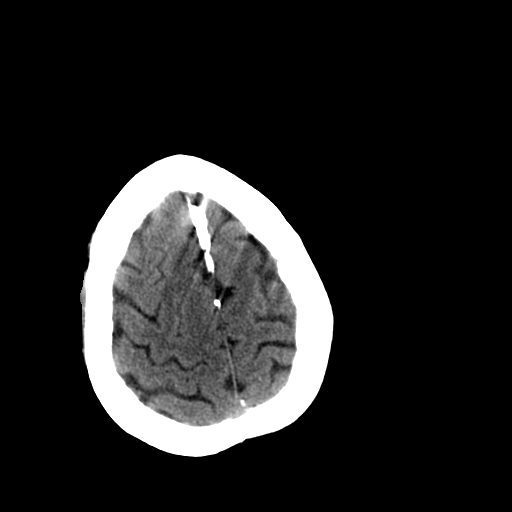
[im 28/36  brain]
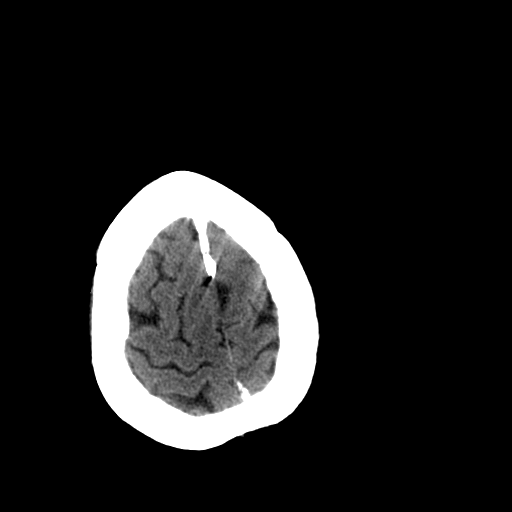
[im 31/36  brain]
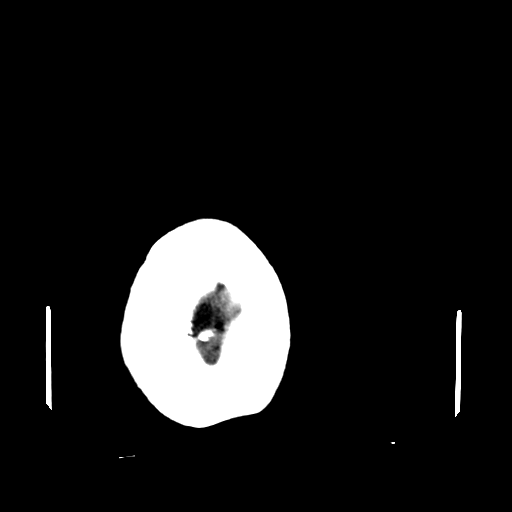
[im 33/36  brain]
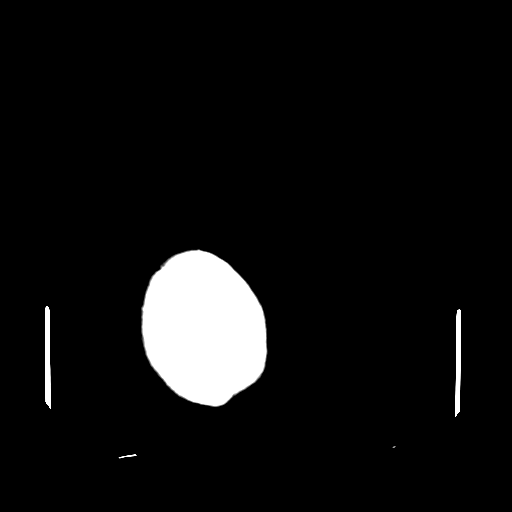
[im 33/36  bone]
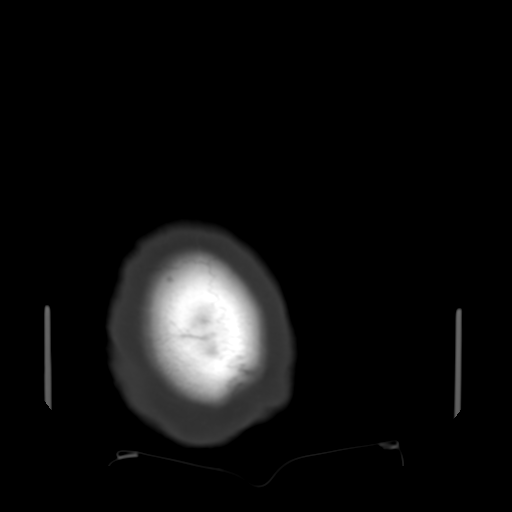

[13 of 30 positions shown; findings below may reference images not displayed]

FINDINGS: Motion degraded exam..

No intracranial hemorrhage.

Small vessel disease type changes without CT evidence of large
acute infarct.

Global atrophy without hydrocephalus.

Partially empty sella.

No intracranial mass lesion detected on this unenhanced exam..

Visualized sinuses and mastoid air cells are clear.
IMPRESSION: No acute abnormality.  Exam is motion degraded.  Please see above.

## 2014-06-26 DIAGNOSIS — K2270 Barrett's esophagus without dysplasia: Secondary | ICD-10-CM | POA: Insufficient documentation

## 2014-07-07 ENCOUNTER — Telehealth: Payer: Self-pay | Admitting: Cardiovascular Disease

## 2014-07-07 NOTE — Telephone Encounter (Signed)
Closed encounter °

## 2014-07-11 ENCOUNTER — Ambulatory Visit: Payer: Medicare Other | Admitting: Cardiovascular Disease

## 2014-07-21 ENCOUNTER — Ambulatory Visit (INDEPENDENT_AMBULATORY_CARE_PROVIDER_SITE_OTHER): Payer: Medicare Other | Admitting: Cardiovascular Disease

## 2014-07-21 ENCOUNTER — Encounter: Payer: Self-pay | Admitting: Cardiovascular Disease

## 2014-07-21 VITALS — BP 152/70 | HR 63 | Ht 63.0 in | Wt 282.0 lb

## 2014-07-21 DIAGNOSIS — I10 Essential (primary) hypertension: Secondary | ICD-10-CM

## 2014-07-21 DIAGNOSIS — E785 Hyperlipidemia, unspecified: Secondary | ICD-10-CM | POA: Diagnosis not present

## 2014-07-21 NOTE — Progress Notes (Signed)
07/21/2014 [facility]   March 30, 1945  [id number]  Primary Physician Alvester Chou, NP Primary Cardiologist: Lorretta Harp MD Renae Gloss   HPI:  The patient is a 69 year old, moderate to severely overweight, widowed [name], mother of two, grandmother to one grandchild, who is accompanied by one of her daughters today. I saw her 78months ago. She has a history of normal coronary arteries and normal LV function by cath performed by Dr. Jenkins Rouge in 2003. Her other problems include obstructive sleep apnea currently weighing CPAP, hypertension, hyperlipidemia, and type 2 diabetes. She is otherwise asymptomatic.  Since I saw her one year ago she's remained currently stable. She denies chest pain and has chronic dyspnea on exertion which has not changed in severity.   Current Outpatient Prescriptions  Medication Sig Dispense Refill  . ALPRAZolam (XANAX) 0.25 MG tablet 0.25 mg. Take 1 tablet (0.25 mg total) by mouth at bedtime as needed for Sleep or Anxiety.    Marland Kitchen aspirin EC 81 MG tablet Take 162 mg by mouth daily.    Marland Kitchen atorvastatin (LIPITOR) 20 MG tablet Take 20 mg by mouth daily at 6 PM.   1  . desonide (DESOWEN) 0.05 % cream Apply 4.13 application topically daily. Uses PRN    . diclofenac sodium (VOLTAREN) 1 % GEL Apply 2 g topically 4 (four) times daily. 2 Tube 2  . dicyclomine (BENTYL) 20 MG tablet Take 1 tablet (20 mg total) by mouth 2 (two) times daily. 60 tablet 3  . DULoxetine (CYMBALTA) 60 MG capsule Take 60 mg by mouth daily.    Marland Kitchen HYDROcodone-acetaminophen (NORCO) 7.5-325 MG per tablet TK 1 T PO  Q 8 H PRN  0  . latanoprost (XALATAN) 0.005 % ophthalmic solution Place 1 drop into both eyes daily.     Marland Kitchen losartan (COZAAR) 100 MG tablet Take 100 mg by mouth daily.    . metFORMIN (GLUCOPHAGE) 1000 MG tablet Take 1,000 mg by mouth daily.     . Mouthwashes (BIOTENE/CALCIUM MT) Take 1 tablet by mouth daily. Use as directed 1 tablet in the mouth or throat  daily.    . OMEGA 3 1000 MG CAPS Take 1 capsule by mouth daily.     Marland Kitchen omeprazole (PRILOSEC) 40 MG capsule Take 40 mg by mouth daily.    . psyllium (METAMUCIL SMOOTH TEXTURE) 28 % packet Take 1 packet by mouth daily. Take 1 packet by mouth 2 (two) times daily.    . vitamin B-12 (CYANOCOBALAMIN) 50 MCG tablet Take 50 mcg by mouth daily.    . calcium-vitamin D (OSCAL WITH D) 500-200 MG-UNIT per tablet Take 1 tablet by mouth daily. Take 1 tablet by mouth daily.    . metoprolol tartrate (LOPRESSOR) 25 MG tablet Take 25 mg by mouth daily.     No current facility-administered medications for this visit.    Allergies  Allergen Reactions  . Alendronate Sodium Anaphylaxis  . Codeine Rash  . Nsaids     Abdominal Pain    History   Social History  . Marital Status: Widowed    Spouse Name: N/A  . Number of Children: 1  . Years of Education: N/A   Occupational History  . RETIRED    Social History Main Topics  . Smoking status: Never Smoker   . Smokeless tobacco: Never Used  . Alcohol Use: 0.6 oz/week    1 Glasses of wine per week     Comment: wine sometimes on holidays and  bedtime  . Drug Use: No  . Sexual Activity: Not on file   Other Topics Concern  . Not on file   Social History Narrative     Review of Systems: General: negative for chills, fever, night sweats or weight changes.  Cardiovascular: negative for chest pain, dyspnea on exertion, edema, orthopnea, palpitations, paroxysmal nocturnal dyspnea or shortness of breath Dermatological: negative for rash Respiratory: negative for cough or wheezing Urologic: negative for hematuria Abdominal: negative for nausea, vomiting, diarrhea, bright red blood per rectum, melena, or hematemesis Neurologic: negative for visual changes, syncope, or dizziness All other systems reviewed and are otherwise negative except as noted above.    Blood pressure 152/70, pulse 63, height 5\' 3"  (1.6 m), weight 282 lb (127.914 kg).  General  appearance: alert and no distress Neck: no adenopathy, no carotid bruit, no JVD, supple, symmetrical, trachea midline and thyroid not enlarged, symmetric, no tenderness/mass/nodules Lungs: clear to auscultation bilaterally Heart: regular rate and rhythm, S1, S2 normal, no murmur, click, rub or gallop Extremities: extremities normal, atraumatic, no cyanosis or edema  EKG normal sinus rhythm at 63 without ST or T-wave changes. There was an isolated PVC. I personally reviewed this EKG  ASSESSMENT AND PLAN:   Essential hypertension History of hypertension blood pressure measured at 152/70. She is on metoprolol and losartan. Continue current meds at current dosing  Hyperlipidemia History of hyperlipidemia on atorvastatin 20 mg a day by her PCP      Lorretta Harp MD [facility], [facility] 07/21/2014 2:17 PM

## 2014-07-21 NOTE — Assessment & Plan Note (Signed)
History of hypertension blood pressure measured at 152/70. She is on metoprolol and losartan. Continue current meds at current dosing

## 2014-07-21 NOTE — Assessment & Plan Note (Signed)
History of hyperlipidemia on atorvastatin 20 mg a day by her PCP

## 2014-07-21 NOTE — Patient Instructions (Signed)
Your physician wants you to follow-up in: 1 year with Dr Berry. You will receive a reminder letter in the mail two months in advance. If you don't receive a letter, please call our office to schedule the follow-up appointment.  

## 2014-10-17 ENCOUNTER — Telehealth: Payer: Self-pay

## 2014-10-17 NOTE — Telephone Encounter (Signed)
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 She is requesting refills on her Dicyclomine 20mg  BID. She has booked a follow up for 12-11-2014. Please advise on refills.

## 2014-10-18 MED ORDER — DICYCLOMINE HCL 20 MG PO TABS: 20.0000 mg | ORAL_TABLET | Freq: Two times a day (BID) | ORAL | Status: DC

## 2014-10-18 NOTE — Telephone Encounter (Signed)
Ok to refill, thanks

## 2014-10-18 NOTE — Telephone Encounter (Signed)
Bentyl has been filled with one refill. Pt notified and aware.

## 2014-11-17 DIAGNOSIS — E1142 Type 2 diabetes mellitus with diabetic polyneuropathy: Secondary | ICD-10-CM | POA: Insufficient documentation

## 2014-12-11 ENCOUNTER — Ambulatory Visit: Payer: Medicare Other | Admitting: Gastroenterology

## 2015-02-27 ENCOUNTER — Encounter: Payer: Self-pay | Admitting: Gastroenterology

## 2015-02-27 ENCOUNTER — Ambulatory Visit (INDEPENDENT_AMBULATORY_CARE_PROVIDER_SITE_OTHER): Payer: Medicare Other | Admitting: Gastroenterology

## 2015-02-27 VITALS — BP 130/80 | HR 72

## 2015-02-27 DIAGNOSIS — K3189 Other diseases of stomach and duodenum: Secondary | ICD-10-CM | POA: Diagnosis not present

## 2015-02-27 DIAGNOSIS — R159 Full incontinence of feces: Secondary | ICD-10-CM

## 2015-02-27 DIAGNOSIS — K31A0 Gastric intestinal metaplasia, unspecified: Secondary | ICD-10-CM

## 2015-02-27 DIAGNOSIS — K219 Gastro-esophageal reflux disease without esophagitis: Secondary | ICD-10-CM | POA: Diagnosis not present

## 2015-02-27 DIAGNOSIS — K2270 Barrett's esophagus without dysplasia: Secondary | ICD-10-CM | POA: Diagnosis not present

## 2015-02-27 MED ORDER — DICYCLOMINE HCL 20 MG PO TABS: 20.0000 mg | ORAL_TABLET | Freq: Two times a day (BID) | ORAL | Status: DC

## 2015-02-27 MED ORDER — OMEPRAZOLE 40 MG PO CPDR: 40.0000 mg | DELAYED_RELEASE_CAPSULE | Freq: Every day | ORAL | Status: DC

## 2015-02-27 NOTE — Patient Instructions (Signed)
We will send in your prescriptions to your pharmacy Bentyl and Omeprazole Follow up in 1 year Recall Colonoscopy in 2022

## 2015-02-27 NOTE — Progress Notes (Signed)
Jade Ellison    AM:[id number]    24-Jul-1945  Primary Care Physician:Barr, Almyra Free, NP  Referring Physician: Alvester Chou, NP [facility] [place], [place] [address]  Chief complaint:  GERD, fecal incontinence  HPI:  70 year old African American female history of GERD and Barrett's esophagus here for follow-up visit. Her upper endoscopy in October 2012 showed gastritis and Barrett's esophagus. Her colonoscopy was negative. Her last EGD in April 2016 did not show any significant abnormality biopsies were obtained. No evidence of Barrett's esophagus, focal intestinal metaplasia from the gastric mucosal biopsies. She continues to have occasional breakthrough symptoms of heartburn every once every week but overall better on once a day PPI . Denies any dysphagia . On review of system complaints of fecal incontinence with seepage intermittently. She is lactose intolerant and avoids drinking well but does not avoid other milk products. She denies abdominal pain, melena, nausea or dysphagia.   Outpatient Encounter Prescriptions as of 02/27/2015  Medication Sig  . calcium-vitamin D (OSCAL WITH D) 500-200 MG-UNIT per tablet Take 1 tablet by mouth daily. Take 1 tablet by mouth daily.  . metoprolol tartrate (LOPRESSOR) 25 MG tablet Take 25 mg by mouth daily.  Marland Kitchen ALPRAZolam (XANAX) 0.25 MG tablet 0.25 mg. Take 1 tablet (0.25 mg total) by mouth at bedtime as needed for Sleep or Anxiety.  Marland Kitchen aspirin EC 81 MG tablet Take 162 mg by mouth daily.  Marland Kitchen atorvastatin (LIPITOR) 20 MG tablet Take 20 mg by mouth daily at 6 PM.   . desonide (DESOWEN) 0.05 % cream Apply [id number] application topically daily. Uses PRN  . diclofenac sodium (VOLTAREN) 1 % GEL Apply 2 g topically 4 (four) times daily.  Marland Kitchen dicyclomine (BENTYL) 20 MG tablet Take 1 tablet (20 mg total) by mouth 2 (two) times daily.  . DULoxetine (CYMBALTA) 60 MG capsule Take 60 mg by mouth daily.  Marland Kitchen  HYDROcodone-acetaminophen (NORCO) 7.5-325 MG per tablet TK 1 T PO  Q 8 H PRN  . latanoprost (XALATAN) 0.005 % ophthalmic solution Place 1 drop into both eyes daily.   Marland Kitchen losartan (COZAAR) 100 MG tablet Take 100 mg by mouth daily.  . metFORMIN (GLUCOPHAGE) 1000 MG tablet Take 1,000 mg by mouth daily.   . Mouthwashes (BIOTENE/CALCIUM MT) Take 1 tablet by mouth daily. Use as directed 1 tablet in the mouth or throat daily.  . OMEGA 3 1000 MG CAPS Take 1 capsule by mouth daily.   Marland Kitchen omeprazole (PRILOSEC) 40 MG capsule Take 40 mg by mouth daily.  . psyllium (METAMUCIL SMOOTH TEXTURE) 28 % packet Take 1 packet by mouth daily. Take 1 packet by mouth 2 (two) times daily.  . vitamin B-12 (CYANOCOBALAMIN) 50 MCG tablet Take 50 mcg by mouth daily.   No facility-administered encounter medications on file as of 02/27/2015.    Allergies as of 02/27/2015 - Review Complete 02/27/2015  Allergen Reaction Noted  . Fosamax [alendronate sodium] Anaphylaxis 07/21/2014  . Codeine Rash 10/02/2010  . Nsaids  02/04/2011    Past Medical History  Diagnosis Date  . Colon polyp     polypoid colorectal mucosa  . Diverticulosis   . Depression   . Nephrolithiasis   . Sleep apnea   . Obesity   . Diabetes mellitus   . Hemorrhoids   . Hernia of unspecified site of abdominal cavity without mention of obstruction or gangrene   . Arthritis   .  Anemia   . Gastritis   . Esophagitis   . 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 Hypertension 09 07 2013    TRANSTHORACIC ECHO STUDY CONCLUSIONS   . Hypertension 09 07 2013    EJECTION FRACTION- 55%-60% .WALL MOTION WAS NORMAL  . Helicobacter pylori (H. pylori)     Past Surgical History  Procedure Laterality Date  . Left sided lithotripsy    . Cholecystectomy    . Knee arthroscopy    . Hernia repair    . Tubal ligation    . Cesarean section  1986  . Enteroscopy  02/05/2012    Procedure: ENTEROSCOPY;  Surgeon: Lafayette Dragon, MD;  Location: [facility];  Service: Endoscopy;   Laterality: N/A;  . Hot hemostasis  02/05/2012    Procedure: HOT HEMOSTASIS (ARGON PLASMA COAGULATION/BICAP);  Surgeon: Lafayette Dragon, MD;  Location: [facility];  Service: Endoscopy;  Laterality: N/A;  . Cardiac catheterization  2003     normal coronary arteries and nomal LV function by cath performed by Dr.Peter Nishan  . Esophagogastroduodenoscopy N/A 04/19/2014    Procedure: ESOPHAGOGASTRODUODENOSCOPY (EGD);  Surgeon: Lafayette Dragon, MD;  Location: [facility];  Service: Endoscopy;  Laterality: N/A;    Family History  Problem Relation Age of Onset  . Heart disease Mother   . Heart disease Father   . Diabetes Brother   . Diabetes Sister   . Kidney cancer Brother   . Colon cancer Neg Hx     Social History   Social History  . Marital Status: Widowed    Spouse Name: N/A  . Number of Children: 1  . Years of Education: N/A   Occupational History  . RETIRED    Social History Main Topics  . Smoking status: Never Smoker   . Smokeless tobacco: Never Used  . Alcohol Use: 0.6 oz/week    1 Glasses of wine per week     Comment: wine sometimes on holidays and bedtime  . Drug Use: No  . Sexual Activity: Not on file   Other Topics Concern  . Not on file   Social History Narrative      Review of systems: Review of Systems  Constitutional: Negative for fever and chills.  HENT: Negative.   Eyes: Negative for blurred vision.  Respiratory: Negative for cough, shortness of breath and wheezing.   Cardiovascular: Negative for chest pain and palpitations.  Gastrointestinal: as per HPI Genitourinary: Negative for dysuria, urgency, frequency and hematuria.  Musculoskeletal: Negative for myalgias, back pain and joint pain.  Skin: Negative for itching and rash.  Neurological: Negative for dizziness, tremors, focal weakness, seizures and loss of consciousness.  Endo/Heme/Allergies: Negative for environmental allergies.  Psychiatric/Behavioral: Negative for depression, suicidal ideas  and hallucinations.  All other systems reviewed and are negative.   Physical Exam: There were no vitals filed for this visit. Gen:      No acute distress HEENT:  EOMI, sclera anicteric Neck:     No masses; no thyromegaly Lungs:    Clear to auscultation bilaterally; normal respiratory effort CV:         Regular rate and rhythm; no murmurs Abd:      + bowel sounds; soft, non-tender; no palpable masses, no distension Ext:    No edema; adequate peripheral perfusion Skin:      Warm and dry; no rash Neuro: alert and oriented x 3 Psych: normal mood and affect  Data Reviewed:  EGD 04/2014 1. history of Barrett's esophagus. Biopsies from the  GE junction pending 2. History of intestinal metaplasia in gastric body. Rule out atrophic gastritis. Biopsies pending 3. Chronic diarrhea. Status post biopsies from second portion of duodenum   Assessment and Plan/Recommendations:  70 year old female with history of obesity chronic GERD, Barrett's esophagus but no evidence of intestinal metaplasia on recent EGD in April 2016 likely sampling error. GERD: Continue PPI Discussed antireflux measures Weight loss: Patient is limited by her osteoarthritis and is not able to exercise Fecal Incontinence: Kegel's exercise Offered referral to Pelvic floor PT but patient felt she may not be able to go to the appointments Avoid Lactose and increase dietary fiber Due for recal EGD in 2017 and recall colonoscopy in 2022 Return in 1 year  K. Denzil Magnuson , MD [phone] Mon-Fri 8a-5p [phone] after 5p, weekends, holidays

## 2015-03-28 ENCOUNTER — Encounter: Payer: Self-pay | Admitting: Gastroenterology

## 2015-04-11 ENCOUNTER — Encounter: Payer: Self-pay | Admitting: Gastroenterology

## 2015-04-11 ENCOUNTER — Other Ambulatory Visit: Payer: Self-pay

## 2015-04-11 ENCOUNTER — Telehealth: Payer: Self-pay | Admitting: Gastroenterology

## 2015-04-11 DIAGNOSIS — K31A0 Gastric intestinal metaplasia, unspecified: Secondary | ICD-10-CM

## 2015-04-11 DIAGNOSIS — K3189 Other diseases of stomach and duodenum: Secondary | ICD-10-CM

## 2015-04-11 DIAGNOSIS — K219 Gastro-esophageal reflux disease without esophagitis: Secondary | ICD-10-CM

## 2015-04-11 DIAGNOSIS — K2270 Barrett's esophagus without dysplasia: Secondary | ICD-10-CM

## 2015-04-11 NOTE — Telephone Encounter (Signed)
No opening for recall cases at the hospital. Will wait for the June/July schedule.

## 2015-04-11 NOTE — Telephone Encounter (Signed)
Yes, She is due for a recall Endo

## 2015-04-11 NOTE — Telephone Encounter (Signed)
Yes. Her BMI is 50.1 What has to be done? Does she have to come in again?

## 2015-04-14 ENCOUNTER — Other Ambulatory Visit: Payer: Self-pay | Admitting: Gastroenterology

## 2015-05-09 ENCOUNTER — Encounter (HOSPITAL_COMMUNITY): Payer: Self-pay

## 2015-05-09 ENCOUNTER — Ambulatory Visit (HOSPITAL_COMMUNITY): Admit: 2015-05-09 | Payer: Self-pay | Admitting: Gastroenterology

## 2015-05-09 SURGERY — ESOPHAGOGASTRODUODENOSCOPY (EGD) WITH PROPOFOL
Anesthesia: Monitor Anesthesia Care

## 2015-05-29 ENCOUNTER — Other Ambulatory Visit: Payer: Self-pay

## 2015-05-29 DIAGNOSIS — K2270 Barrett's esophagus without dysplasia: Secondary | ICD-10-CM

## 2015-05-29 DIAGNOSIS — K219 Gastro-esophageal reflux disease without esophagitis: Secondary | ICD-10-CM

## 2015-05-29 NOTE — Telephone Encounter (Signed)
Patient agrees to the 08/01/15 procedure date. Letter with instructions sent to the patient.

## 2015-06-15 ENCOUNTER — Encounter: Payer: Medicare Other | Admitting: Gastroenterology

## 2015-07-31 ENCOUNTER — Encounter (HOSPITAL_COMMUNITY): Payer: Self-pay | Admitting: *Deleted

## 2015-08-01 ENCOUNTER — Encounter (HOSPITAL_COMMUNITY): Payer: Self-pay | Admitting: *Deleted

## 2015-08-01 ENCOUNTER — Ambulatory Visit (HOSPITAL_COMMUNITY)
Admission: RE | Admit: 2015-08-01 | Discharge: 2015-08-01 | Disposition: A | Discharge status: Home / Self Care | Payer: Medicare Other | Source: Ambulatory Visit | Attending: Gastroenterology | Admitting: Gastroenterology

## 2015-08-01 ENCOUNTER — Ambulatory Visit (HOSPITAL_COMMUNITY): Payer: Medicare Other | Admitting: Certified Registered Nurse Anesthetist

## 2015-08-01 ENCOUNTER — Encounter (HOSPITAL_COMMUNITY)
Admission: RE | Disposition: A | Discharge status: Home / Self Care | Payer: Self-pay | Source: Ambulatory Visit | Attending: Gastroenterology

## 2015-08-01 DIAGNOSIS — Z9049 Acquired absence of other specified parts of digestive tract: Secondary | ICD-10-CM | POA: Diagnosis not present

## 2015-08-01 DIAGNOSIS — F419 Anxiety disorder, unspecified: Secondary | ICD-10-CM | POA: Insufficient documentation

## 2015-08-01 DIAGNOSIS — I2510 Atherosclerotic heart disease of native coronary artery without angina pectoris: Secondary | ICD-10-CM | POA: Diagnosis not present

## 2015-08-01 DIAGNOSIS — Z885 Allergy status to narcotic agent status: Secondary | ICD-10-CM | POA: Diagnosis not present

## 2015-08-01 DIAGNOSIS — I10 Essential (primary) hypertension: Secondary | ICD-10-CM | POA: Insufficient documentation

## 2015-08-01 DIAGNOSIS — F329 Major depressive disorder, single episode, unspecified: Secondary | ICD-10-CM | POA: Insufficient documentation

## 2015-08-01 DIAGNOSIS — K2270 Barrett's esophagus without dysplasia: Secondary | ICD-10-CM

## 2015-08-01 DIAGNOSIS — M810 Age-related osteoporosis without current pathological fracture: Secondary | ICD-10-CM | POA: Diagnosis not present

## 2015-08-01 DIAGNOSIS — Z886 Allergy status to analgesic agent status: Secondary | ICD-10-CM | POA: Insufficient documentation

## 2015-08-01 DIAGNOSIS — Z6841 Body Mass Index (BMI) 40.0 and over, adult: Secondary | ICD-10-CM | POA: Diagnosis not present

## 2015-08-01 DIAGNOSIS — Z7982 Long term (current) use of aspirin: Secondary | ICD-10-CM | POA: Insufficient documentation

## 2015-08-01 DIAGNOSIS — G629 Polyneuropathy, unspecified: Secondary | ICD-10-CM | POA: Insufficient documentation

## 2015-08-01 DIAGNOSIS — E119 Type 2 diabetes mellitus without complications: Secondary | ICD-10-CM | POA: Insufficient documentation

## 2015-08-01 DIAGNOSIS — I471 Supraventricular tachycardia: Secondary | ICD-10-CM | POA: Insufficient documentation

## 2015-08-01 DIAGNOSIS — Z9851 Tubal ligation status: Secondary | ICD-10-CM | POA: Insufficient documentation

## 2015-08-01 DIAGNOSIS — G4730 Sleep apnea, unspecified: Secondary | ICD-10-CM | POA: Diagnosis not present

## 2015-08-01 DIAGNOSIS — K219 Gastro-esophageal reflux disease without esophagitis: Secondary | ICD-10-CM | POA: Diagnosis not present

## 2015-08-01 DIAGNOSIS — Z888 Allergy status to other drugs, medicaments and biological substances status: Secondary | ICD-10-CM | POA: Diagnosis not present

## 2015-08-01 DIAGNOSIS — M1990 Unspecified osteoarthritis, unspecified site: Secondary | ICD-10-CM | POA: Diagnosis not present

## 2015-08-01 DIAGNOSIS — R198 Other specified symptoms and signs involving the digestive system and abdomen: Secondary | ICD-10-CM | POA: Diagnosis not present

## 2015-08-01 DIAGNOSIS — Z79899 Other long term (current) drug therapy: Secondary | ICD-10-CM | POA: Insufficient documentation

## 2015-08-01 HISTORY — DX: Polyneuropathy, unspecified: G62.9

## 2015-08-01 HISTORY — DX: Personal history of urinary calculi: Z87.442

## 2015-08-01 HISTORY — DX: Unspecified glaucoma: H40.9

## 2015-08-01 HISTORY — DX: Personal history of other medical treatment: Z92.89

## 2015-08-01 HISTORY — DX: Other complications of anesthesia, initial encounter: T88.59XA

## 2015-08-01 HISTORY — PX: ESOPHAGOGASTRODUODENOSCOPY (EGD) WITH PROPOFOL: SHX5813

## 2015-08-01 HISTORY — DX: Other reaction to spinal and lumbar puncture: G97.1

## 2015-08-01 HISTORY — DX: Unspecified osteoarthritis, unspecified site: M19.90

## 2015-08-01 HISTORY — DX: Anxiety disorder, unspecified: F41.9

## 2015-08-01 HISTORY — DX: Adverse effect of unspecified anesthetic, initial encounter: T41.45XA

## 2015-08-01 HISTORY — DX: Reserved for inherently not codable concepts without codable children: IMO0001

## 2015-08-01 LAB — GLUCOSE, CAPILLARY: Glucose-Capillary: 145 mg/dL — ABNORMAL HIGH (ref 65–99)

## 2015-08-01 SURGERY — ESOPHAGOGASTRODUODENOSCOPY (EGD) WITH PROPOFOL
Anesthesia: Monitor Anesthesia Care

## 2015-08-01 MED ORDER — LACTATED RINGERS IV SOLN
INTRAVENOUS | Status: DC
  Administered 2015-08-01: 12:00:00 via INTRAVENOUS

## 2015-08-01 MED ORDER — LIDOCAINE HCL (CARDIAC) 20 MG/ML IV SOLN
INTRAVENOUS | Status: DC | PRN
  Administered 2015-08-01: 40 mg via INTRATRACHEAL

## 2015-08-01 MED ORDER — SODIUM CHLORIDE 0.9 % IV SOLN: INTRAVENOUS | Status: DC

## 2015-08-01 MED ORDER — PROPOFOL 500 MG/50ML IV EMUL
INTRAVENOUS | Status: DC | PRN
  Administered 2015-08-01: 80 ug/kg/min via INTRAVENOUS

## 2015-08-01 MED ORDER — PROPOFOL 10 MG/ML IV BOLUS
INTRAVENOUS | Status: DC | PRN
Start: 2015-08-01 — End: 2015-08-01
  Administered 2015-08-01: 30 mg via INTRAVENOUS

## 2015-08-01 SURGICAL SUPPLY — 15 items

## 2015-08-01 NOTE — H&P (Signed)
[facility] History and Physical   Primary Care Physician:  Leamon Arnt, MD   Reason for Procedure:  Persistent GERD symptoms, h/o barrett's esophagus Plan:    EGD with biopsies     HPI: Jade Ellison is a 70 y.o. female with multiple co morbidities here for EGD for evaluation of persistent GERD symptoms despite therapy and surveillance of Barrett's esophagus.    Past Medical History  Diagnosis Date  . Colon polyp     polypoid colorectal mucosa  . Diverticulosis   . Depression   . Obesity   . Hemorrhoids   . Hernia of unspecified site of abdominal cavity without mention of obstruction or gangrene   . Anemia   . Gastritis   . Esophagitis   . 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 Hypertension 09 07 2013    TRANSTHORACIC ECHO STUDY CONCLUSIONS   . Hypertension 09 07 2013    EJECTION FRACTION- 55%-60% .WALL MOTION WAS NORMAL  . Helicobacter pylori (H. pylori)   . Osteoporosis   . CAD (coronary artery disease)   . PSVT (paroxysmal supraventricular tachycardia) ([place])   . Olecranon bursitis of left elbow   . Shortness of breath dyspnea   . Complication of anesthesia   . Spinal headache 1986  . Sleep apnea     on CPAP- does not use machine  . History of kidney stones   . Anxiety     Panic attack- - 1 year ago (cries - doneest know why and gets nervous  . Neuropathy ([place])   . Arthritis   . Osteoarthritis     right hip  . DJD (degenerative joint disease)     left knee  . History of blood transfusion   . Glaucoma     Past Surgical History  Procedure Laterality Date  . Left sided lithotripsy    . Cholecystectomy    . Knee arthroscopy Left   . Hernia repair    . Tubal ligation    . Cesarean section  1986  . Enteroscopy  02/05/2012    Procedure: ENTEROSCOPY;  Surgeon: Lafayette Dragon, MD;  Location: [facility];  Service: Endoscopy;  Laterality: N/A;  . Hot hemostasis  02/05/2012    Procedure: HOT HEMOSTASIS (ARGON PLASMA COAGULATION/BICAP);  Surgeon: Lafayette Dragon, MD;  Location: [facility];  Service: Endoscopy;  Laterality: N/A;  . Cardiac catheterization  2003     normal coronary arteries and nomal LV function by cath performed by Dr.Peter Nishan  . Esophagogastroduodenoscopy N/A 04/19/2014    Procedure: ESOPHAGOGASTRODUODENOSCOPY (EGD);  Surgeon: Lafayette Dragon, MD;  Location: [facility];  Service: Endoscopy;  Laterality: N/A;    Prior to Admission medications   Medication Sig Start Date End Date Taking? Authorizing Provider  aspirin EC 81 MG tablet Take 162 mg by mouth daily.   Yes Historical Provider, MD  atorvastatin (LIPITOR) 20 MG tablet Take 20 mg by mouth daily at 6 PM.  06/23/14  Yes Historical Provider, MD  calcium-vitamin D (OSCAL WITH D) 500-200 MG-UNIT per tablet Take 1 tablet by mouth daily. Take 1 tablet by mouth daily. 07/29/11 07/31/15 Yes Historical Provider, MD  desonide (DESOWEN) 0.05 % cream Apply [id number] application topically daily. Uses PRN 09/29/11  Yes Historical Provider, MD  diclofenac sodium (VOLTAREN) 1 % GEL Apply 2 g topically 4 (four) times daily. 04/22/12  Yes Karen Prueter, PA-C  dicyclomine (BENTYL) 20 MG tablet TAKE 1 TABLET(20 MG) BY MOUTH TWICE DAILY 04/17/15  Yes Mauri Pole,  MD  DULoxetine (CYMBALTA) 60 MG capsule Take 60 mg by mouth daily.   Yes Historical Provider, MD  gabapentin (NEURONTIN) 300 MG capsule Take 1 capsule by mouth 2 (two) times daily. 07/06/15  Yes Historical Provider, MD  HYDROcodone-acetaminophen ([place]) 7.5-325 MG per tablet TK 1 T PO  Q 8 H AS NEEDED FOR PAIN 05/17/14  Yes Historical Provider, MD  latanoprost (XALATAN) 0.005 % ophthalmic solution Place 1 drop into both eyes daily.  12/05/11  Yes Historical Provider, MD  losartan-hydrochlorothiazide (HYZAAR) 100-12.5 MG tablet Take 1 tablet by mouth daily. 07/02/15  Yes Historical Provider, MD  metFORMIN (GLUCOPHAGE) 1000 MG tablet Take 1,000 mg by mouth 2 (two) times daily with a meal.    Yes Historical Provider, MD  metoprolol tartrate  (LOPRESSOR) 25 MG tablet Take 50 mg by mouth 2 (two) times daily.  09/20/11 07/31/15 Yes Annita Brod, MD  Mouthwashes (BIOTENE/CALCIUM MT) Take 1 tablet by mouth daily. Use as directed 1 tablet in the mouth or throat daily.   Yes Historical Provider, MD  OMEGA 3 1000 MG CAPS Take 1 capsule by mouth daily.    Yes Historical Provider, MD  omeprazole (PRILOSEC) 40 MG capsule Take 1 capsule (40 mg total) by mouth daily. 02/27/15  Yes Mauri Pole, MD  psyllium (METAMUCIL SMOOTH TEXTURE) 28 % packet Take 1 packet by mouth daily. Take 1 packet by mouth 2 (two) times daily.   Yes Historical Provider, MD  vitamin B-12 (CYANOCOBALAMIN) 50 MCG tablet Take 50 mcg by mouth daily.   Yes Historical Provider, MD  ALPRAZolam (XANAX) 0.25 MG tablet 0.25 mg. Take 1 tablet (0.25 mg total) by mouth at bedtime as needed for Sleep or Anxiety. 08/17/12   Historical Provider, MD    Current Facility-Administered Medications  Medication Dose Route Frequency Provider Last Rate Last Dose  . 0.9 %  sodium chloride infusion   Intravenous Continuous Mauri Pole, MD      . lactated ringers infusion   Intravenous Continuous Mauri Pole, MD 10 mL/hr at 08/01/15 1134      Allergies as of 05/29/2015 - Review Complete 02/27/2015  Allergen Reaction Noted  . Fosamax [alendronate sodium] Anaphylaxis 07/21/2014  . Codeine Rash 10/02/2010  . Nsaids  02/04/2011    Family History  Problem Relation Age of Onset  . Heart disease Mother   . Heart disease Father   . Diabetes Brother   . Diabetes Sister   . Kidney cancer Brother   . Colon cancer Neg Hx     Social History   Social History  . Marital Status: Widowed    Spouse Name: N/A  . Number of Children: 1  . Years of Education: N/A   Occupational History  . RETIRED    Social History Main Topics  . Smoking status: Never Smoker   . Smokeless tobacco: Never Used  . Alcohol Use: 0.6 oz/week    1 Glasses of wine per week     Comment: wine sometimes  on holidays and bedtime- rarely  . Drug Use: No  . Sexual Activity: Not on file   Other Topics Concern  . Not on file   Social History Narrative    Review of Systems:  All other review of systems negative except as mentioned in the HPI.  Physical Exam: Vital signs in last 24 hours: Temp:  [98.8 F (37.1 C)] 98.8 F (37.1 C) (07/19 1116) Pulse Rate:  [54] 54 (07/19 1116) Resp:  [21] 21 (07/19  1116) BP: (155)/(61) 155/61 mmHg (07/19 1116) SpO2:  [97 %] 97 % (07/19 1116) Weight:  [282 lb (127.914 kg)] 282 lb (127.914 kg) (07/19 1116)   General:   Alert,  Well-developed, well-nourished, pleasant and cooperative in NAD Lungs:  Clear throughout to auscultation.   Heart:  Regular rate and rhythm; no murmurs, clicks, rubs,  or gallops. Abdomen:  Soft, nontender and nondistended. Normal bowel sounds.   Neuro/Psych:  Alert and cooperative. Normal mood and affect. A and O x 3   @K .Denzil Magnuson, MD [facility] [phone] (pager) 08/01/2015 12:05 PM@

## 2015-08-01 NOTE — Op Note (Signed)
[facility] Patient Name: Jade Ellison Procedure Date : 08/01/2015 MRN: [id number] Attending MD: Mauri Pole , MD Date of Birth: [birth year] CSN: [id number] Age: 70 Admit Type: Outpatient Procedure:                Upper GI endoscopy Indications:              Esophageal reflux symptoms that recur despite                            appropriate therapy, Upper abdominal symptoms that                            persist despite an appropriate trial of therapy Providers:                Mauri Pole, MD, Kingsley Plan, RN, Cletis Athens, Technician Referring MD:              Medicines:                Monitored Anesthesia Care Complications:            No immediate complications. Estimated Blood Loss:     Estimated blood loss: none. Procedure:                Pre-Anesthesia Assessment:                           - Prior to the procedure, a History and Physical                            was performed, and patient medications and                            allergies were reviewed. The patient's tolerance of                            previous anesthesia was also reviewed. The risks                            and benefits of the procedure and the sedation                            options and risks were discussed with the patient.                            All questions were answered, and informed consent                            was obtained. Prior Anticoagulants: The patient                            last took aspirin 1 day prior to the procedure. ASA  Grade Assessment: IV - A patient with severe                            systemic disease that is a constant threat to life.                            After reviewing the risks and benefits, the patient                            was deemed in satisfactory condition to undergo the                            procedure.                           After obtaining  informed consent, the endoscope was                            passed under direct vision. Throughout the                            procedure, the patient's blood pressure, pulse, and                            oxygen saturations were monitored continuously. The                            [id number]) scope was introduced through the                            mouth, and advanced to the second part of duodenum.                            The upper GI endoscopy was accomplished without                            difficulty. The patient tolerated the procedure                            well. Scope In: Scope Out: Findings:      The esophagus was normal. Regular Z-line with no evidence of Barrett's       esophagus      The stomach was normal.      The examined duodenum was normal. Impression:               - Normal esophagus.                           - Normal stomach.                           - Normal examined duodenum.                           - No specimens collected. Moderate Sedation:  N/A Recommendation:           - Patient has a contact number available for                            emergencies. The signs and symptoms of potential                            delayed complications were discussed with the                            patient. Return to normal activities tomorrow.                            Written discharge instructions were provided to the                            patient.                           - Resume previous diet.                           - Continue present medications.                           - Follow an antireflux regimen indefinitely. This                            includes:                           - Do not lie down for at least 3 to 4 hours after                            meals.                           - Raise the head of the bed 4 to 6 inches.                           - Decrease excess weight.                           -  Avoid citrus juices and other acidic foods,                            alcohol, chocolate, mints, coffee and other                            caffeinated beverages, carbonated beverages, fatty                            and fried foods.                           - Avoid tight-fitting clothing.                           -  Avoid cigarettes and other tobacco products.                           - No repeat upper endoscopy.                           - Return to GI office PRN. Procedure Code(s):        --- Professional ---                           [phone], Esophagogastroduodenoscopy, flexible,                            transoral; diagnostic, including collection of                            specimen(s) by brushing or washing, when performed                            (separate procedure) Diagnosis Code(s):        --- Professional ---                           K21.9, Gastro-esophageal reflux disease without                            esophagitis                           R19.8, Other specified symptoms and signs involving                            the digestive system and abdomen CPT copyright 2016 American Medical Association. All rights reserved. The codes documented in this report are preliminary and upon coder review may  be revised to meet current compliance requirements. Mauri Pole, MD 08/01/2015 12:49:06 PM This report has been signed electronically. Number of Addenda: 0

## 2015-08-01 NOTE — Transfer of Care (Signed)
Immediate Anesthesia Transfer of Care Note  Patient: Jade Ellison  Procedure(s) Performed: Procedure(s): ESOPHAGOGASTRODUODENOSCOPY (EGD) WITH PROPOFOL (N/A)  Patient Location: Endoscopy Unit  Anesthesia Type:MAC  Level of Consciousness: awake, oriented and patient cooperative  Airway & Oxygen Therapy: Patient Spontanous Breathing and Patient connected to nasal cannula oxygen  Post-op Assessment: Report given to RN, Post -op Vital signs reviewed and stable and Patient moving all extremities  Post vital signs: Reviewed and stable  Last Vitals:  Filed Vitals:   08/01/15 1116  BP: 155/61  Pulse: 54  Temp: 37.1 C  Resp: 21    Last Pain: There were no vitals filed for this visit.       Complications: No apparent anesthesia complications

## 2015-08-01 NOTE — Discharge Instructions (Signed)
Esophagogastroduodenoscopy, Care After Refer to this sheet in the next few weeks. These instructions provide you with information about caring for yourself after your procedure. Your health care provider may also give you more specific instructions. Your treatment has been planned according to current medical practices, but problems sometimes occur. Call your health care provider if you have any problems or questions after your procedure. WHAT TO EXPECT AFTER THE PROCEDURE After your procedure, it is typical to feel:  Soreness in your throat.  Pain with swallowing.  Sick to your stomach (nauseous).  Bloated.  Dizzy.  Fatigued. HOME CARE INSTRUCTIONS  Do not eat or drink anything until the numbing medicine (local anesthetic) has worn off and your gag reflex has returned. You will know that the local anesthetic has worn off when you can swallow comfortably.  Do not drive or operate machinery until directed by your health care provider.  Take medicines only as directed by your health care provider. SEEK MEDICAL CARE IF:   You cannot stop coughing.  You are not urinating at all or less than usual. SEEK IMMEDIATE MEDICAL CARE IF:  You have difficulty swallowing.  You cannot eat or drink.  You have worsening throat or chest pain.  You have dizziness or lightheadedness or you faint.  You have nausea or vomiting.  You have chills.  You have a fever.  You have severe abdominal pain.  You have black, tarry, or bloody stools.   This information is not intended to replace advice given to you by your health care provider. Make sure you discuss any questions you have with your health care provider.   Document Released: 12/17/2011 Document Revised: 01/20/2014 Document Reviewed: 12/17/2011 Elsevier Interactive Patient Education 2016 [place] Monitored anesthesia care is an anesthesia service for a medical procedure. Anesthesia is the loss of  the ability to feel pain. It is produced by medicines called anesthetics. It may affect a small area of your body (local anesthesia), a large area of your body (regional anesthesia), or your entire body (general anesthesia). The need for monitored anesthesia care depends your procedure, your condition, and the potential need for regional or general anesthesia. It is often provided during procedures where:   General anesthesia may be needed if there are complications. This is because you need special care when you are under general anesthesia.   You will be under local or regional anesthesia. This is so that you are able to have higher levels of anesthesia if needed.   You will receive calming medicines (sedatives). This is especially the case if sedatives are given to put you in a semi-conscious state of relaxation (deep sedation). This is because the amount of sedative needed to produce this state can be hard to predict. Too much of a sedative can produce general anesthesia. Monitored anesthesia care is performed by one or more health care providers who have special training in all types of anesthesia. You will need to meet with these health care providers before your procedure. During this meeting, they will ask you about your medical history. They will also give you instructions to follow. (For example, you will need to stop eating and drinking before your procedure. You may also need to stop or change medicines you are taking.) During your procedure, your health care providers will stay with you. They will:   Watch your condition. This includes watching your blood pressure, breathing, and level of pain.   Diagnose and treat problems that  occur.   Give medicines if they are needed. These may include calming medicines (sedatives) and anesthetics.   Make sure you are comfortable.  Having monitored anesthesia care does not necessarily mean that you will be under anesthesia. It does mean that  your health care providers will be able to manage anesthesia if you need it or if it occurs. It also means that you will be able to have a different type of anesthesia than you are having if you need it. When your procedure is complete, your health care providers will continue to watch your condition. They will make sure any medicines wear off before you are allowed to go home.    This information is not intended to replace advice given to you by your health care provider. Make sure you discuss any questions you have with your health care provider.   Document Released: 09/25/2004 Document Revised: 01/20/2014 Document Reviewed: 02/11/2012 Elsevier Interactive Patient Education [employer].

## 2015-08-01 NOTE — Anesthesia Preprocedure Evaluation (Signed)
Anesthesia Evaluation  Patient identified by MRN, date of birth, ID band Patient awake    Reviewed: Allergy & Precautions, NPO status , Patient's Chart, lab work & pertinent test results  History of Anesthesia Complications (+) POST - OP SPINAL HEADACHE and history of anesthetic complications  Airway Mallampati: II  TM Distance: >3 FB Neck ROM: Full    Dental  (+) Teeth Intact   Pulmonary shortness of breath, sleep apnea ,    breath sounds clear to auscultation       Cardiovascular hypertension, + CAD   Rhythm:Regular     Neuro/Psych  Headaches, PSYCHIATRIC DISORDERS Anxiety Depression    GI/Hepatic GERD  Medicated and Controlled,  Endo/Other  diabetesMorbid obesity  Renal/GU      Musculoskeletal  (+) Arthritis ,   Abdominal   Peds  Hematology  (+) anemia ,   Anesthesia Other Findings   Reproductive/Obstetrics                             Anesthesia Physical Anesthesia Plan  ASA: III  Anesthesia Plan: MAC   Post-op Pain Management:    Induction: Intravenous  Airway Management Planned: Natural Airway, Nasal Cannula and Simple Face Mask  Additional Equipment: None  Intra-op Plan:   Post-operative Plan:   Informed Consent: I have reviewed the patients History and Physical, chart, labs and discussed the procedure including the risks, benefits and alternatives for the proposed anesthesia with the patient or authorized representative who has indicated his/her understanding and acceptance.   Dental advisory given  Plan Discussed with: CRNA and Surgeon  Anesthesia Plan Comments:         Anesthesia Quick Evaluation

## 2015-08-02 ENCOUNTER — Encounter (HOSPITAL_COMMUNITY): Payer: Self-pay | Admitting: Gastroenterology

## 2015-08-03 NOTE — Anesthesia Postprocedure Evaluation (Signed)
Anesthesia Post Note  Patient: Jade Ellison  Procedure(s) Performed: Procedure(s) (LRB): ESOPHAGOGASTRODUODENOSCOPY (EGD) WITH PROPOFOL (N/A)  Patient location during evaluation: Endoscopy Anesthesia Type: MAC Level of consciousness: awake Pain management: pain level controlled Vital Signs Assessment: post-procedure vital signs reviewed and stable Respiratory status: spontaneous breathing Cardiovascular status: stable Postop Assessment: no signs of nausea or vomiting Anesthetic complications: no    Last Vitals:  Filed Vitals:   08/01/15 1305 08/01/15 1315  BP: 173/88 175/83  Pulse: 56 62  Temp:    Resp: 23 14    Last Pain: There were no vitals filed for this visit.               Aneita Kiger

## 2015-08-10 ENCOUNTER — Ambulatory Visit: Payer: Medicare Other | Admitting: Cardiovascular Disease

## 2015-08-13 ENCOUNTER — Other Ambulatory Visit: Payer: Self-pay | Admitting: Gastroenterology

## 2015-08-28 ENCOUNTER — Telehealth: Payer: Self-pay | Admitting: Gastroenterology

## 2015-08-29 ENCOUNTER — Other Ambulatory Visit: Payer: Self-pay

## 2015-08-29 MED ORDER — DICYCLOMINE HCL 20 MG PO TABS: 20.0000 mg | ORAL_TABLET | Freq: Three times a day (TID) | ORAL | 0 refills | Status: DC

## 2015-08-29 NOTE — Telephone Encounter (Signed)
Patient is having loose stool after every meal or snack. No bowel movements in the night. No incontinence. Denies blood with bowel movement, no fever or nausea. She is taking Bentyl and Prilosec daily. She has had her symptoms for about 2 weeks now.

## 2015-08-29 NOTE — Telephone Encounter (Signed)
Please advise patient to avoid all dairy products/lactose. Continue Bentyl TID as needed after meals.

## 2015-08-29 NOTE — Telephone Encounter (Signed)
Patient agrees to this plan. °

## 2015-09-15 ENCOUNTER — Other Ambulatory Visit: Payer: Self-pay | Admitting: Gastroenterology

## 2015-09-18 ENCOUNTER — Ambulatory Visit (INDEPENDENT_AMBULATORY_CARE_PROVIDER_SITE_OTHER): Payer: Medicare Other | Admitting: Cardiovascular Disease

## 2015-09-18 ENCOUNTER — Encounter: Payer: Self-pay | Admitting: Cardiovascular Disease

## 2015-09-18 VITALS — BP 122/74 | HR 64 | Ht 63.0 in | Wt 280.0 lb

## 2015-09-18 DIAGNOSIS — I10 Essential (primary) hypertension: Secondary | ICD-10-CM | POA: Diagnosis not present

## 2015-09-18 DIAGNOSIS — E785 Hyperlipidemia, unspecified: Secondary | ICD-10-CM | POA: Diagnosis not present

## 2015-09-18 NOTE — Assessment & Plan Note (Signed)
History of hyperlipidemia on atorvastatin followed by her PCP 

## 2015-09-18 NOTE — Patient Instructions (Signed)
Medication Instructions:  Your physician recommends that you continue on your current medications as directed. Please refer to the Current Medication list given to you today.   Labwork: Labwork will be requested from your primary care physician.  Follow-Up: Your physician wants you to follow-up in: 12 MONTHS WITH DR BERRY. You will receive a reminder letter in the mail two months in advance. If you don't receive a letter, please call our office to schedule the follow-up appointment.   If you need a refill on your cardiac medications before your next appointment, please call your pharmacy.   

## 2015-09-18 NOTE — Assessment & Plan Note (Signed)
History of hypertension blood pressure is 120/74. She is on metoprolol. Continue current meds at current dosing

## 2015-09-18 NOTE — Progress Notes (Signed)
09/18/2015 Jade Ellison   04/01/1945  [id number]  Primary Physician Leamon Arnt, MD Primary Cardiologist: Lorretta Harp MD Lupe Carney, [place]  HPI:  The patient is a 70 year old, moderate to severely overweight, widowed [name], mother of two, grandmother to one grandchild, who is accompanied by one of her daughters today. I saw her 07/21/14.Marland Kitchen She has a history of normal coronary arteries and normal LV function by cath performed by Dr. Jenkins Rouge in 2003. Her other problems include obstructive sleep apnea currently weighing CPAP, hypertension, hyperlipidemia, and type 2 diabetes. She is otherwise asymptomatic.  Since I saw her one year ago she's remained currently stable. She denies chest pain and has chronic dyspnea on exertion which has not changed in severity.    Current Outpatient Prescriptions  Medication Sig Dispense Refill  . ALPRAZolam (XANAX) 0.25 MG tablet 0.25 mg. Take 1 tablet (0.25 mg total) by mouth at bedtime as needed for Sleep or Anxiety.    Marland Kitchen aspirin EC 81 MG tablet Take 162 mg by mouth daily.    Marland Kitchen atorvastatin (LIPITOR) 20 MG tablet Take 20 mg by mouth daily at 6 PM.   1  . desonide (DESOWEN) 0.05 % cream Apply [id number] application topically daily. Uses PRN    . diclofenac sodium (VOLTAREN) 1 % GEL Apply 2 g topically 4 (four) times daily. 2 Tube 2  . dicyclomine (BENTYL) 20 MG tablet Take 1 tablet (20 mg total) by mouth 3 (three) times daily after meals. 90 tablet 0  . dicyclomine (BENTYL) 20 MG tablet TAKE 1 TABLET(20 MG) BY MOUTH TWICE DAILY 60 tablet 0  . DULoxetine (CYMBALTA) 60 MG capsule Take 60 mg by mouth daily.    Marland Kitchen gabapentin (NEURONTIN) 300 MG capsule Take 1 capsule by mouth 2 (two) times daily.  1  . HYDROcodone-acetaminophen (NORCO) 7.5-325 MG per tablet TK 1 T PO  Q 8 H AS NEEDED FOR PAIN  0  . latanoprost (XALATAN) 0.005 % ophthalmic solution Place 1 drop into both eyes daily.     Marland Kitchen losartan-hydrochlorothiazide (HYZAAR)  100-12.5 MG tablet Take 1 tablet by mouth daily.  11  . metFORMIN (GLUCOPHAGE) 1000 MG tablet Take 1,000 mg by mouth 2 (two) times daily with a meal.     . Mouthwashes (BIOTENE/CALCIUM MT) Take 1 tablet by mouth daily. Use as directed 1 tablet in the mouth or throat daily.    . OMEGA 3 1000 MG CAPS Take 1 capsule by mouth daily.     Marland Kitchen omeprazole (PRILOSEC) 40 MG capsule Take 1 capsule (40 mg total) by mouth daily. 30 capsule 11  . psyllium (METAMUCIL SMOOTH TEXTURE) 28 % packet Take 1 packet by mouth daily. Take 1 packet by mouth 2 (two) times daily.    . vitamin B-12 (CYANOCOBALAMIN) 50 MCG tablet Take 50 mcg by mouth daily.    . calcium-vitamin D (OSCAL WITH D) 500-200 MG-UNIT per tablet Take 1 tablet by mouth daily. Take 1 tablet by mouth daily.    . metoprolol tartrate (LOPRESSOR) 25 MG tablet Take 50 mg by mouth 2 (two) times daily.      No current facility-administered medications for this visit.     Allergies  Allergen Reactions  . Fosamax [Alendronate Sodium] Anaphylaxis  . Codeine Rash  . Nsaids     Abdominal Pain    Social History   Social History  . Marital status: Widowed    Spouse name: N/A  . Number of children:  1  . Years of education: N/A   Occupational History  . RETIRED Retired   Social History Main Topics  . Smoking status: Never Smoker  . Smokeless tobacco: Never Used  . Alcohol use 0.6 oz/week    1 Glasses of wine per week     Comment: wine sometimes on holidays and bedtime- rarely  . Drug use: No  . Sexual activity: Not on file   Other Topics Concern  . Not on file   Social History Narrative  . No narrative on file     Review of Systems: General: negative for chills, fever, night sweats or weight changes.  Cardiovascular: negative for chest pain, dyspnea on exertion, edema, orthopnea, palpitations, paroxysmal nocturnal dyspnea or shortness of breath Dermatological: negative for rash Respiratory: negative for cough or wheezing Urologic:  negative for hematuria Abdominal: negative for nausea, vomiting, diarrhea, bright red blood per rectum, melena, or hematemesis Neurologic: negative for visual changes, syncope, or dizziness All other systems reviewed and are otherwise negative except as noted above.    Blood pressure 122/74, pulse 64, height 5\' 3"  (1.6 m), weight 280 lb (127 kg).  General appearance: alert and no distress Neck: no adenopathy, no carotid bruit, no JVD, supple, symmetrical, trachea midline and thyroid not enlarged, symmetric, no tenderness/mass/nodules Lungs: clear to auscultation bilaterally Heart: regular rate and rhythm, S1, S2 normal, no murmur, click, rub or gallop Extremities: extremities normal, atraumatic, no cyanosis or edema  EKG sinus rhythm at 64 with trigeminal PVCs. I personally reviewed this EKG  ASSESSMENT AND PLAN:   Essential hypertension History of hypertension blood pressure is 120/74. She is on metoprolol. Continue current meds at current dosing  Hyperlipidemia History of hyperlipidemia on atorvastatin followed by her PCP      Lorretta Harp MD [facility], [facility] 09/18/2015 3:13 PM

## 2015-11-27 ENCOUNTER — Other Ambulatory Visit: Payer: Self-pay | Admitting: Family Medicine

## 2015-11-27 DIAGNOSIS — R5381 Other malaise: Secondary | ICD-10-CM

## 2015-11-27 DIAGNOSIS — Z1231 Encounter for screening mammogram for malignant neoplasm of breast: Secondary | ICD-10-CM

## 2015-11-28 ENCOUNTER — Other Ambulatory Visit: Payer: Self-pay | Admitting: Family Medicine

## 2015-11-28 DIAGNOSIS — E2839 Other primary ovarian failure: Secondary | ICD-10-CM

## 2015-12-17 ENCOUNTER — Other Ambulatory Visit: Payer: Self-pay | Admitting: Gastroenterology

## 2016-01-11 ENCOUNTER — Other Ambulatory Visit: Payer: Self-pay | Admitting: Gastroenterology

## 2016-02-15 ENCOUNTER — Telehealth: Payer: Self-pay | Admitting: Gastroenterology

## 2016-02-15 NOTE — Telephone Encounter (Signed)
Pt states she is having diarrhea after every meal. States she is taking Imodium and it is not helping. Pt instructed to stay away from dailry/lactose and to take her bentyl tid after meals as she had been instructed to do in August. Pt will try this, she verbalized understanding.

## 2016-02-28 ENCOUNTER — Other Ambulatory Visit: Payer: Self-pay | Admitting: Gastroenterology

## 2016-03-17 ENCOUNTER — Telehealth: Payer: Self-pay | Admitting: Gastroenterology

## 2016-03-17 MED ORDER — DICYCLOMINE HCL 20 MG PO TABS: 20.0000 mg | ORAL_TABLET | Freq: Three times a day (TID) | ORAL | 2 refills | Status: DC

## 2016-03-17 NOTE — Telephone Encounter (Signed)
Bentyl sent to pharmacy

## 2016-03-17 NOTE — Telephone Encounter (Signed)
Called pt to inform med sent  

## 2016-07-31 ENCOUNTER — Other Ambulatory Visit: Payer: Self-pay | Admitting: Gastroenterology

## 2016-08-07 ENCOUNTER — Other Ambulatory Visit: Payer: Self-pay | Admitting: Gastroenterology

## 2016-09-04 ENCOUNTER — Other Ambulatory Visit: Payer: Self-pay | Admitting: Gastroenterology

## 2016-09-30 ENCOUNTER — Ambulatory Visit: Payer: Medicare Other | Admitting: Cardiovascular Disease

## 2016-10-13 ENCOUNTER — Encounter: Payer: Self-pay | Admitting: Cardiology

## 2016-10-13 ENCOUNTER — Ambulatory Visit (INDEPENDENT_AMBULATORY_CARE_PROVIDER_SITE_OTHER): Payer: Medicare Other | Admitting: Cardiology

## 2016-10-13 VITALS — BP 134/62 | HR 57 | Ht 63.0 in | Wt 290.0 lb

## 2016-10-13 DIAGNOSIS — E1149 Type 2 diabetes mellitus with other diabetic neurological complication: Secondary | ICD-10-CM | POA: Diagnosis not present

## 2016-10-13 DIAGNOSIS — I10 Essential (primary) hypertension: Secondary | ICD-10-CM | POA: Diagnosis not present

## 2016-10-13 DIAGNOSIS — N183 Chronic kidney disease, stage 3 unspecified: Secondary | ICD-10-CM

## 2016-10-13 DIAGNOSIS — G4730 Sleep apnea, unspecified: Secondary | ICD-10-CM | POA: Diagnosis not present

## 2016-10-13 DIAGNOSIS — R945 Abnormal results of liver function studies: Secondary | ICD-10-CM

## 2016-10-13 DIAGNOSIS — R7989 Other specified abnormal findings of blood chemistry: Secondary | ICD-10-CM

## 2016-10-13 DIAGNOSIS — E785 Hyperlipidemia, unspecified: Secondary | ICD-10-CM

## 2016-10-13 DIAGNOSIS — N289 Disorder of kidney and ureter, unspecified: Secondary | ICD-10-CM | POA: Diagnosis not present

## 2016-10-13 DIAGNOSIS — N1831 Chronic kidney disease, stage 3a: Secondary | ICD-10-CM | POA: Insufficient documentation

## 2016-10-13 DIAGNOSIS — IMO0001 Reserved for inherently not codable concepts without codable children: Secondary | ICD-10-CM

## 2016-10-13 HISTORY — DX: Chronic kidney disease, stage 3a: N18.31

## 2016-10-13 MED ORDER — ATORVASTATIN CALCIUM 20 MG PO TABS: 20.0000 mg | ORAL_TABLET | Freq: Every day | ORAL | 3 refills | Status: DC

## 2016-10-13 NOTE — Progress Notes (Signed)
OK will defer this to her new PCP, Thanks.  LK

## 2016-10-13 NOTE — Patient Instructions (Addendum)
Medication Instructions: Kerin Ransom, PA-C, recommends that you continue on your current medications as directed. Please refer to the Current Medication list given to you today.  Labwork: Your physician recommends that you return for lab work in [place].  Testing/Procedures: 1. Sleep Study - Your physician has recommended that you have a sleep study. This test records several body functions during sleep, including: brain activity, eye movement, oxygen and carbon dioxide blood levels, heart rate and rhythm, breathing rate and rhythm, the flow of air through your mouth and nose, snoring, body muscle movements, and chest and belly movement. **This will be performed at [facility]  Follow-up: Lurena Joiner recommends that you schedule a follow-up appointment in 12 months with Dr Gwenlyn Found. You will receive a reminder letter in the mail two months in advance. If you don't receive a letter, please call our office to schedule the follow-up appointment.  If you need a refill on your cardiac medications before your next appointment, please call your pharmacy.   Please reestablish with a primary care physician.

## 2016-10-13 NOTE — Progress Notes (Signed)
10/13/2016 Jade Ellison   1945-06-24  [id number]  Primary Physician Leamon Arnt, MD Primary Cardiologist: Dr Gwenlyn Found  HPI:  71 y/o morbidly obese female with a history of DM, HTN, HLD, morbid obesity, and sleep apnea. She had remote normal cornaries in 2003. 2D in 2013 showed normal LVF with grade 1 DD. She is in the office for a one year check. She tells me her PCP has left and she needs refills on some of her medications, apparently a replacement PCP in that practice has not yet been arranged. 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 She has not used her C-pap in more than a year-"broken". She has not been hospitalized or had significant illness since her LOV.    Current Outpatient Prescriptions  Medication Sig Dispense Refill  . ALPRAZolam (XANAX) 0.25 MG tablet 0.25 mg. Take 1 tablet (0.25 mg total) by mouth at bedtime as needed for Sleep or Anxiety.    Marland Kitchen aspirin EC 81 MG tablet Take 162 mg by mouth daily.    Marland Kitchen atorvastatin (LIPITOR) 20 MG tablet Take 1 tablet (20 mg total) by mouth daily. 90 tablet 3  . diclofenac sodium (VOLTAREN) 1 % GEL Apply 2 g topically 4 (four) times daily. 2 Tube 2  . dicyclomine (BENTYL) 20 MG tablet TAKE 1 TABLET(20 MG) BY MOUTH THREE TIMES DAILY AFTER MEALS 90 tablet 0  . DULoxetine (CYMBALTA) 60 MG capsule Take 60 mg by mouth daily.    Marland Kitchen HYDROcodone-acetaminophen (NORCO) 7.5-325 MG per tablet TK 1 T PO  Q 8 H AS NEEDED FOR PAIN  0  . latanoprost (XALATAN) 0.005 % ophthalmic solution Place 1 drop into both eyes daily.     Marland Kitchen losartan-hydrochlorothiazide (HYZAAR) 100-12.5 MG tablet Take 1 tablet by mouth daily.  11  . metFORMIN (GLUCOPHAGE) 1000 MG tablet Take 1,000 mg by mouth 2 (two) times daily with a meal.     . Mouthwashes (BIOTENE/CALCIUM MT) Take 1 tablet by mouth daily. Use as directed 1 tablet in the mouth or throat daily.    . OMEGA 3 1000 MG CAPS Take 1 capsule by mouth daily.     Marland Kitchen omeprazole (PRILOSEC) 40 MG  capsule TAKE 1 CAPSULE(40 MG) BY MOUTH DAILY 90 capsule 0  . psyllium (METAMUCIL SMOOTH TEXTURE) 28 % packet Take 1 packet by mouth daily. Take 1 packet by mouth 2 (two) times daily.    . vitamin B-12 (CYANOCOBALAMIN) 50 MCG tablet Take 50 mcg by mouth daily.    . calcium-vitamin D (OSCAL WITH D) 500-200 MG-UNIT per tablet Take 1 tablet by mouth daily. Take 1 tablet by mouth daily.    . metoprolol tartrate (LOPRESSOR) 25 MG tablet Take 50 mg by mouth 2 (two) times daily.      No current facility-administered medications for this visit.     Allergies  Allergen Reactions  . Fosamax [Alendronate Sodium] Anaphylaxis  . Codeine Rash  . Nsaids     Abdominal Pain    Past Medical History:  Diagnosis Date  . Anemia   . Anxiety    Panic attack- - 1 year ago (cries - doneest know why and gets nervous  . Arthritis   . 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 CAD (coronary artery disease)   . Colon polyp    polypoid colorectal mucosa  . Complication of anesthesia   . Depression   . Diverticulosis   . DJD (degenerative joint disease)  left knee  . Esophagitis   . Gastritis   . Glaucoma   . Helicobacter pylori (H. pylori)   . Hemorrhoids   . Hernia of unspecified site of abdominal cavity without mention of obstruction or gangrene   . History of blood transfusion   . History of kidney stones   . Hypertension 09 07 2013   TRANSTHORACIC ECHO STUDY CONCLUSIONS   . Hypertension 09 07 2013   EJECTION FRACTION- 55%-60% .WALL MOTION WAS NORMAL  . Neuropathy ([place])   . Obesity   . Olecranon bursitis of left elbow   . Osteoarthritis    right hip  . Osteoporosis   . PSVT (paroxysmal supraventricular tachycardia) ([place])   . Shortness of breath dyspnea   . Sleep apnea    on CPAP- does not use machine  . Spinal headache 1986    Social History   Social History  . Marital status: Widowed    Spouse name: N/A  . Number of children: 1  . Years of education: N/A   Occupational History  . RETIRED  Retired   Social History Main Topics  . Smoking status: Never Smoker  . Smokeless tobacco: Never Used  . Alcohol use 0.6 oz/week    1 Glasses of wine per week     Comment: wine sometimes on holidays and bedtime- rarely  . Drug use: No  . Sexual activity: Not on file   Other Topics Concern  . Not on file   Social History Narrative  . No narrative on file     Family History  Problem Relation Age of Onset  . Heart disease Mother   . Heart disease Father   . Diabetes Brother   . Diabetes Sister   . Kidney cancer Brother   . Colon cancer Neg Hx      Review of Systems: General: negative for chills, fever, night sweats or weight changes.  Cardiovascular: negative for chest pain, dyspnea on exertion, edema, orthopnea, palpitations, paroxysmal nocturnal dyspnea or shortness of breath Dermatological: negative for rash Respiratory: negative for cough or wheezing Urologic: negative for hematuria Abdominal: negative for nausea, vomiting, diarrhea, bright red blood per rectum, melena, or hematemesis Neurologic: negative for visual changes, syncope, or dizziness All other systems reviewed and are otherwise negative except as noted above.    Blood pressure 134/62, pulse (!) 57, height 5\' 3"  (1.6 m), weight 290 lb (131.5 kg).  General appearance: alert, cooperative, no distress and morbidly obese Lungs: clear to auscultation bilaterally Heart: regular rate and rhythm Skin: Skin color, texture, turgor normal. No rashes or lesions Neurologic: Grossly normal  EKG NSR, 57  ASSESSMENT AND PLAN:   Sleep apnea Remote sleep study. 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 I will also re check her renal function and arrange for sleep apnea evaluation. I asked Dr Gwenlyn Found about possibly trying a wgt loss medication- Locaserin may be a good choice for her, but he is not comfortable with that and would  defer to her PCP. She has been encouraged to f/u with her former PCP's practice about getting established with a new PCP.   Kerin Ransom PA-C 10/13/2016 2:05 PM

## 2016-10-13 NOTE — Assessment & Plan Note (Signed)
GFR 47 June 2018

## 2016-10-13 NOTE — Assessment & Plan Note (Signed)
Controlled.  

## 2016-10-13 NOTE — Assessment & Plan Note (Signed)
Followed by PCP- LDL 111 in June 2018 with mildly elevated LFTs

## 2016-10-13 NOTE — Assessment & Plan Note (Signed)
Remote sleep study. She says she hasn't used her C-pap in more than a year-"broken"

## 2016-10-13 NOTE — Assessment & Plan Note (Signed)
BMI 51 Sept 2018

## 2016-10-13 NOTE — Assessment & Plan Note (Signed)
Type 2 NIDDM with neuropathy and CRI-3 by lab June 2018 (GFR 47)

## 2016-10-14 LAB — COMPREHENSIVE METABOLIC PANEL
ALT: 37 IU/L — ABNORMAL HIGH (ref 0–32)
AST: 35 IU/L (ref 0–40)
Albumin/Globulin Ratio: 1.6 (ref 1.2–2.2)
Albumin: 4.2 g/dL (ref 3.5–4.8)
Alkaline Phosphatase: 68 IU/L (ref 39–117)
BUN/Creatinine Ratio: 21 (ref 12–28)
BUN: 25 mg/dL (ref 8–27)
Bilirubin Total: 0.8 mg/dL (ref 0.0–1.2)
CO2: 24 mmol/L (ref 20–29)
Calcium: 9.7 mg/dL (ref 8.7–10.3)
Chloride: 99 mmol/L (ref 96–106)
Creatinine, Ser: 1.17 mg/dL — ABNORMAL HIGH (ref 0.57–1.00)
GFR calc Af Amer: 54 mL/min/{1.73_m2} — ABNORMAL LOW (ref >59)
GFR calc non Af Amer: 47 mL/min/{1.73_m2} — ABNORMAL LOW (ref >59)
Globulin, Total: 2.6 g/dL (ref 1.5–4.5)
Glucose: 116 mg/dL — ABNORMAL HIGH (ref 65–99)
Potassium: 4.5 mmol/L (ref 3.5–5.2)
Sodium: 139 mmol/L (ref 134–144)
Total Protein: 6.8 g/dL (ref 6.0–8.5)

## 2016-11-02 ENCOUNTER — Other Ambulatory Visit: Payer: Self-pay | Admitting: Gastroenterology

## 2016-11-02 ENCOUNTER — Encounter (HOSPITAL_BASED_OUTPATIENT_CLINIC_OR_DEPARTMENT_OTHER): Payer: Medicare Other

## 2016-11-18 ENCOUNTER — Other Ambulatory Visit: Payer: Self-pay | Admitting: Gastroenterology

## 2016-12-05 ENCOUNTER — Other Ambulatory Visit: Payer: Self-pay | Admitting: Gastroenterology

## 2017-01-19 ENCOUNTER — Other Ambulatory Visit: Payer: Self-pay | Admitting: Gastroenterology

## 2017-02-08 ENCOUNTER — Other Ambulatory Visit: Payer: Self-pay | Admitting: Gastroenterology

## 2017-03-13 ENCOUNTER — Other Ambulatory Visit: Payer: Self-pay | Admitting: Gastroenterology

## 2017-03-31 ENCOUNTER — Telehealth: Payer: Self-pay | Admitting: Gastroenterology

## 2017-03-31 MED ORDER — DICYCLOMINE HCL 20 MG PO TABS: ORAL_TABLET | ORAL | 1 refills | Status: DC

## 2017-03-31 NOTE — Telephone Encounter (Signed)
Med sent to pharmacy   Appointment scheduled for a follow up

## 2017-04-01 DIAGNOSIS — E1169 Type 2 diabetes mellitus with other specified complication: Secondary | ICD-10-CM | POA: Insufficient documentation

## 2017-04-28 ENCOUNTER — Other Ambulatory Visit: Payer: Self-pay | Admitting: Gastroenterology

## 2017-05-19 ENCOUNTER — Other Ambulatory Visit: Payer: Self-pay | Admitting: Gastroenterology

## 2017-06-16 ENCOUNTER — Ambulatory Visit: Payer: Medicare Other | Admitting: Gastroenterology

## 2017-06-18 ENCOUNTER — Other Ambulatory Visit: Payer: Self-pay | Admitting: Gastroenterology

## 2017-09-02 ENCOUNTER — Other Ambulatory Visit: Payer: Self-pay | Admitting: Gastroenterology

## 2017-09-08 ENCOUNTER — Ambulatory Visit: Payer: Medicare Other | Admitting: Diagnostic Neuroimaging

## 2017-09-08 ENCOUNTER — Encounter: Payer: Self-pay | Admitting: Diagnostic Neuroimaging

## 2017-09-09 ENCOUNTER — Telehealth: Payer: Self-pay

## 2017-09-09 NOTE — Telephone Encounter (Signed)
Patient no showed appointment with Dr. Leta Baptist on 09/08/2017. MB RN.

## 2017-09-11 ENCOUNTER — Other Ambulatory Visit: Payer: Self-pay | Admitting: Gastroenterology

## 2017-09-20 DIAGNOSIS — F411 Generalized anxiety disorder: Secondary | ICD-10-CM | POA: Insufficient documentation

## 2017-09-20 DIAGNOSIS — K582 Mixed irritable bowel syndrome: Secondary | ICD-10-CM | POA: Insufficient documentation

## 2017-11-10 ENCOUNTER — Ambulatory Visit: Payer: Medicare Other | Admitting: Cardiovascular Disease

## 2017-12-09 ENCOUNTER — Ambulatory Visit: Payer: Medicare Other | Admitting: Cardiovascular Disease

## 2018-02-09 ENCOUNTER — Ambulatory Visit: Payer: Medicare Other | Admitting: Diagnostic Neuroimaging

## 2018-02-10 ENCOUNTER — Telehealth: Payer: Self-pay | Admitting: Diagnostic Neuroimaging

## 2018-02-10 NOTE — Telephone Encounter (Signed)
FYI- Patient has had 2 new patient no-shows at [facility].

## 2018-04-06 ENCOUNTER — Telehealth: Payer: Self-pay | Admitting: Cardiovascular Disease

## 2018-04-06 NOTE — Telephone Encounter (Signed)
   Cardiac Questionnaire:    Since your last visit or hospitalization:    1. Have you been having new or worsening chest pain? no   2. Have you been having new or worsening shortness of breath? no 3. Have you been having new or worsening leg swelling, wt gain, or increase in abdominal girth (pants fitting more tightly)? no   4. Have you had any passing out spells? no    *A YES to any of these questions would result in the appointment being kept. *If all the answers to these questions are NO, we should indicate that given the current situation regarding the worldwide coronarvirus pandemic, at the recommendation of the CDC, we are looking to limit gatherings in our waiting area, and thus will reschedule their appointment beyond four weeks from today.    Follow up scheduled          .            

## 2018-04-06 NOTE — Telephone Encounter (Signed)
Follow Up:    Pt wants to know does she still have an appt with Dr Gwenlyn Found tomorrow?

## 2018-04-07 ENCOUNTER — Ambulatory Visit: Payer: Medicare Other | Admitting: Cardiovascular Disease

## 2018-04-27 ENCOUNTER — Ambulatory Visit: Payer: Medicare Other | Admitting: Gastroenterology

## 2018-04-28 ENCOUNTER — Encounter: Payer: Self-pay | Admitting: Gastroenterology

## 2018-04-28 ENCOUNTER — Ambulatory Visit: Payer: Medicare Other | Admitting: Gastroenterology

## 2018-04-28 ENCOUNTER — Ambulatory Visit (INDEPENDENT_AMBULATORY_CARE_PROVIDER_SITE_OTHER): Payer: Medicare Other | Admitting: Gastroenterology

## 2018-04-28 ENCOUNTER — Other Ambulatory Visit: Payer: Self-pay

## 2018-04-28 VITALS — Ht 63.0 in | Wt 300.0 lb

## 2018-04-28 DIAGNOSIS — R197 Diarrhea, unspecified: Secondary | ICD-10-CM | POA: Diagnosis not present

## 2018-04-28 DIAGNOSIS — E739 Lactose intolerance, unspecified: Secondary | ICD-10-CM | POA: Diagnosis not present

## 2018-04-28 DIAGNOSIS — K219 Gastro-esophageal reflux disease without esophagitis: Secondary | ICD-10-CM | POA: Diagnosis not present

## 2018-04-28 NOTE — Progress Notes (Signed)
Jade Ellison    [id number]    08-16-1945  Primary Care Physician:No primary care provider on file.  Referring Physician: No referring provider defined for this encounter.  This service was provided via audio telemedicine (telephone) due to [place] 19 pandemic.  Patient location: Home Provider location: Office Used 2 patient identifiers to confirm the correct person. Explained the limitations in evaluation and management via telemedicine. Patient is aware of potential medical charges for this visit.  Patient consented to this virtual visit.  The persons participating in this telemedicine service were myself and the patient    Chief complaint:  GERD  HPI: 73 year old African-American female with history of chronic GERD and Barrett's esophagus. EGD April 2016 negative for Barrett's esophagus, gastric biopsies showed focal intestinal metaplasia. EGD July 2017 normal Colonoscopy October 2012 normal with no polyps After she took antibiotics last month, she has to use bathroom every time after she eats.  Denies any mucus or bleeding.  No nocturnal symptoms. Denies any abdominal pain, melena, rectal bleeding, nausea, vomiting or weight loss.    Outpatient Encounter Medications as of 04/28/2018  Medication Sig  . ALPRAZolam (XANAX) 0.25 MG tablet 0.25 mg. Take 1 tablet (0.25 mg total) by mouth at bedtime as needed for Sleep or Anxiety.  Marland Kitchen aspirin EC 81 MG tablet Take 162 mg by mouth daily.  Marland Kitchen atorvastatin (LIPITOR) 40 MG tablet Take by mouth.  . desonide (DESOWEN) 0.05 % cream Apply topically 2 (two) times daily.  . diclofenac sodium (VOLTAREN) 1 % GEL Apply 2 g topically 4 (four) times daily.  Marland Kitchen dicyclomine (BENTYL) 20 MG tablet TAKE 1 TABLET(20 MG) BY MOUTH THREE TIMES DAILY AFTER MEALS  . DULoxetine (CYMBALTA) 60 MG capsule Take 60 mg by mouth daily.  Marland Kitchen glipiZIDE (GLUCOTROL) 10 MG tablet Take 10 mg by mouth daily before breakfast.  . HYDROcodone-acetaminophen  (NORCO) 7.5-325 MG per tablet TK 1 T PO  Q 8 H AS NEEDED FOR PAIN  . latanoprost (XALATAN) 0.005 % ophthalmic solution Place 1 drop into both eyes daily.   Marland Kitchen losartan-hydrochlorothiazide (HYZAAR) 100-12.5 MG tablet Take 1 tablet by mouth daily.  . metFORMIN (GLUCOPHAGE) 1000 MG tablet Take 1,000 mg by mouth 2 (two) times daily with a meal.   . Mouthwashes (BIOTENE/CALCIUM MT) Take 1 tablet by mouth daily. Use as directed 1 tablet in the mouth or throat daily.  . OMEGA 3 1000 MG CAPS Take 1 capsule by mouth daily.   Marland Kitchen omeprazole (PRILOSEC) 40 MG capsule TAKE 1 CAPSULE(40 MG) BY MOUTH DAILY  . psyllium (METAMUCIL SMOOTH TEXTURE) 28 % packet Take 1 packet by mouth daily. Take 1 packet by mouth 2 (two) times daily.  . vitamin B-12 (CYANOCOBALAMIN) 50 MCG tablet Take 50 mcg by mouth daily.  . calcium-vitamin D (OSCAL WITH D) 500-200 MG-UNIT per tablet Take 1 tablet by mouth daily. Take 1 tablet by mouth daily.  . metoprolol tartrate (LOPRESSOR) 25 MG tablet Take 50 mg by mouth 2 (two) times daily.    No facility-administered encounter medications on file as of 04/28/2018.     Allergies as of 04/28/2018 - Review Complete 04/28/2018  Allergen Reaction Noted  . Fosamax [alendronate sodium] Anaphylaxis 07/21/2014  . Codeine Rash 10/02/2010  . Nsaids  02/04/2011    Past Medical History:  Diagnosis Date  . Adult stuttering   . Anemia   . Anxiety    Panic attack- - 1 year ago (cries -  doneest know why and gets nervous  . Arthritis   . 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 CAD (coronary artery disease)   . Colon polyp    polypoid colorectal mucosa  . Complication of anesthesia   . Depression   . Diverticulosis   . DJD (degenerative joint disease)    left knee  . Esophagitis   . Gastritis   . Glaucoma   . Helicobacter pylori (H. pylori)   . Hemorrhoids   . Hernia of unspecified site of abdominal cavity without mention of obstruction or gangrene   . History of blood transfusion   . History of  kidney stones   . Hypertension 09 07 2013   TRANSTHORACIC ECHO STUDY CONCLUSIONS   . Hypertension 09 07 2013   EJECTION FRACTION- 55%-60% .WALL MOTION WAS NORMAL  . Impaired mobility   . Iron deficiency anemia   . Morbid obesity ([place])   . Neuropathy   . Obesity   . Olecranon bursitis of left elbow   . OSA (obstructive sleep apnea)    On cpap  . Osteoarthritis    right hip  . Osteoporosis   . Primary osteoarthritis    Bilaterally (knee)  . PSVT (paroxysmal supraventricular tachycardia) ([place])   . PSVT (paroxysmal supraventricular tachycardia) ([place])   . Short-term memory loss   . Shortness of breath dyspnea   . Sleep apnea    on CPAP- does not use machine  . Spinal headache 1986  . Type 2 diabetes mellitus ([place])     Past Surgical History:  Procedure Laterality Date  . CARDIAC CATHETERIZATION  2003    normal coronary arteries and nomal LV function by cath performed by Dr.Peter Nishan  . CESAREAN SECTION  1986  . CHOLECYSTECTOMY    . ENTEROSCOPY  02/05/2012   Procedure: ENTEROSCOPY;  Surgeon: Lafayette Dragon, MD;  Location: [facility];  Service: Endoscopy;  Laterality: N/A;  . ESOPHAGOGASTRODUODENOSCOPY N/A 04/19/2014   Procedure: ESOPHAGOGASTRODUODENOSCOPY (EGD);  Surgeon: Lafayette Dragon, MD;  Location: [facility];  Service: Endoscopy;  Laterality: N/A;  . ESOPHAGOGASTRODUODENOSCOPY (EGD) WITH PROPOFOL N/A 08/01/2015   Procedure: ESOPHAGOGASTRODUODENOSCOPY (EGD) WITH PROPOFOL;  Surgeon: Mauri Pole, MD;  Location: [facility];  Service: Endoscopy;  Laterality: N/A;  . HERNIA REPAIR    . HOT HEMOSTASIS  02/05/2012   Procedure: HOT HEMOSTASIS (ARGON PLASMA COAGULATION/BICAP);  Surgeon: Lafayette Dragon, MD;  Location: [facility];  Service: Endoscopy;  Laterality: N/A;  . KNEE ARTHROSCOPY Left   . left sided lithotripsy    . TUBAL LIGATION      Family History  Problem Relation Age of Onset  . Heart disease Mother   . Heart disease Father   . Diabetes Brother   .  Diabetes Sister   . Kidney cancer Brother   . Colon cancer Neg Hx     Social History   Socioeconomic History  . Marital status: Widowed    Spouse name: Not on file  . Number of children: 1  . Years of education: Not on file  . Highest education level: Not on file  Occupational History  . Occupation: RETIRED    Employer: RETIRED  Social Needs  . Financial resource strain: Not on file  . Food insecurity:    Worry: Not on file    Inability: Not on file  . Transportation needs:    Medical: Not on file    Non-medical: Not on file  Tobacco Use  . Smoking status: Never Smoker  .  Smokeless tobacco: Never Used  Substance and Sexual Activity  . Alcohol use: Yes    Alcohol/week: 1.0 standard drinks    Types: 1 Glasses of wine per week    Comment: wine sometimes on holidays and bedtime- rarely  . Drug use: No  . Sexual activity: Not on file  Lifestyle  . Physical activity:    Days per week: Not on file    Minutes per session: Not on file  . Stress: Not on file  Relationships  . Social connections:    Talks on phone: Not on file    Gets together: Not on file    Attends religious service: Not on file    Active member of club or organization: Not on file    Attends meetings of clubs or organizations: Not on file    Relationship status: Not on file  . Intimate partner violence:    Fear of current or ex partner: Not on file    Emotionally abused: Not on file    Physically abused: Not on file    Forced sexual activity: Not on file  Other Topics Concern  . Not on file  Social History Narrative  . Not on file      Review of systems: Review of Systems as per HPI All other systems reviewed and are negative.   Physical Exam: Vitals were not taken and physical exam was not performed during this virtual visit.  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 Difficile Start probiotic Florastor 1 capsule twice daily for 2 weeks Lactose-free diet Continue antireflux measures and omeprazole daily Follow-up in 2 to 3 weeks    K. Denzil Magnuson , MD   CC: No ref. provider found

## 2018-04-28 NOTE — Patient Instructions (Addendum)
Start probiotic Florastor 1 capsule twice daily for 2 weeks  Trial of lactose-free diet for 1 to 2 weeks  Okay to use Lactaid milk  Follow-up visit by telephone in 2 weeks- May 6th at 1:30pm   I appreciate the opportunity to care for you.

## 2018-04-29 ENCOUNTER — Telehealth: Payer: Self-pay | Admitting: *Deleted

## 2018-04-29 ENCOUNTER — Encounter: Payer: Self-pay | Admitting: *Deleted

## 2018-04-29 NOTE — Telephone Encounter (Signed)
Called patient and advised her due to current COVID 19 pandemic, our office is severely reducing in person visits in order to minimize the risk to our patients and healthcare providers. We recommend to convert your appointment to a video visit. We'll take all precautions to reduce any security or privacy concerns. This will be treated like an office visit, and we will file with your insurance. She consented to video visit and stated her daughter may be able to help her but needs 4:30 appt. I advised will call her daughter, she verbalized understanding, appreciation. Called dgtr, Jade Ellison on [facility] and explained above to her. Her email is [email or web address]. She understands that the [employer] webex software must be downloaded and operational on the device pt plans to use for the visit. Called pt again and advised her Jade Ellison will help with video visit next Tues, updated EMR. She  verbalized understanding, appreciation. Webex scheduled; e mail sent.

## 2018-05-04 ENCOUNTER — Other Ambulatory Visit: Payer: Self-pay

## 2018-05-04 ENCOUNTER — Encounter: Payer: Self-pay | Admitting: Diagnostic Neuroimaging

## 2018-05-04 ENCOUNTER — Ambulatory Visit (INDEPENDENT_AMBULATORY_CARE_PROVIDER_SITE_OTHER): Payer: Medicare Other | Admitting: Diagnostic Neuroimaging

## 2018-05-04 DIAGNOSIS — R413 Other amnesia: Secondary | ICD-10-CM

## 2018-05-04 NOTE — Progress Notes (Signed)
[facility]  PATIENT: Jade Ellison DOB: 12/04/1945  REFERRING CLINICIAN: Rogelia Boga, PA HISTORY FROM: patient and cousin REASON FOR VISIT: new consult    HISTORICAL  CHIEF COMPLAINT:  Chief Complaint  Patient presents with  . Memory Loss    HISTORY OF PRESENT ILLNESS:   73 year old female here for evaluation of mild memory loss.  For past 1 year patient reports mild short-term memory loss.  Sometimes she forgets what she was doing in one room when she goes up the next.  Sometimes she has trouble focusing when reading books.  Patient lives alone and has done so for past 18 years.  She has mild depression, mild anxiety, mild sleep problems.  She has sleep apnea but has not been on CPAP for many years.  She also has significant degenerative arthritis, chronic pain, obesity and poor mobility.  She maintains most of her ADLs at home, but some are limited due to pain and arthritis issues.  No limitations of ADLs related to her memory issues.    REVIEW OF SYSTEMS: Full 14 system review of systems performed and negative with exception of: As per HPI.  ALLERGIES: Allergies  Allergen Reactions  . Fosamax [Alendronate Sodium] Anaphylaxis  . Codeine Rash  . Nsaids     Abdominal Pain    HOME MEDICATIONS: Outpatient Medications Prior to Visit  Medication Sig Dispense Refill  . albuterol (PROVENTIL HFA;VENTOLIN HFA) 108 (90 Base) MCG/ACT inhaler Inhale into the lungs.    . ALPRAZolam (XANAX) 0.25 MG tablet 0.25 mg. Take 1 tablet (0.25 mg total) by mouth at bedtime as needed for Sleep or Anxiety.    Marland Kitchen aspirin EC 81 MG tablet Take 162 mg by mouth daily.    Marland Kitchen atorvastatin (LIPITOR) 40 MG tablet Take by mouth.    . calcium-vitamin D (OSCAL WITH D) 500-200 MG-UNIT per tablet Take 1 tablet by mouth daily. Take 1 tablet by mouth daily.    Marland Kitchen desonide (DESOWEN) 0.05 % cream Apply topically 2 (two) times daily.    . diclofenac sodium (VOLTAREN) 1 % GEL Apply 2 g topically  4 (four) times daily. 2 Tube 2  . dicyclomine (BENTYL) 20 MG tablet TAKE 1 TABLET(20 MG) BY MOUTH THREE TIMES DAILY AFTER MEALS 90 tablet 0  . DULoxetine (CYMBALTA) 60 MG capsule Take 60 mg by mouth daily.    Marland Kitchen glipiZIDE (GLUCOTROL) 10 MG tablet Take 10 mg by mouth daily before breakfast.    . HYDROcodone-acetaminophen (NORCO) 7.5-325 MG per tablet TK 1 T PO  Q 8 H AS NEEDED FOR PAIN  0  . latanoprost (XALATAN) 0.005 % ophthalmic solution Place 1 drop into both eyes daily.     Marland Kitchen losartan-hydrochlorothiazide (HYZAAR) 100-12.5 MG tablet Take 1 tablet by mouth daily.  11  . metFORMIN (GLUCOPHAGE) 1000 MG tablet Take 1,000 mg by mouth 2 (two) times daily with a meal.     . metoprolol tartrate (LOPRESSOR) 25 MG tablet Take 50 mg by mouth 2 (two) times daily.     . Mouthwashes (BIOTENE/CALCIUM MT) Take 1 tablet by mouth daily. Use as directed 1 tablet in the mouth or throat daily.    . OMEGA 3 1000 MG CAPS Take 1 capsule by mouth daily.     Marland Kitchen omeprazole (PRILOSEC) 40 MG capsule TAKE 1 CAPSULE(40 MG) BY MOUTH DAILY 90 capsule 0  . psyllium (METAMUCIL SMOOTH TEXTURE) 28 % packet Take 1 packet by mouth daily. Take 1 packet by mouth 2 (two) times  daily.    . vitamin B-12 (CYANOCOBALAMIN) 50 MCG tablet Take 50 mcg by mouth daily.     No facility-administered medications prior to visit.     PAST MEDICAL HISTORY: Past Medical History:  Diagnosis Date  . Adult stuttering   . Anemia   . Anxiety    Panic attack- - 1 year ago (cries - doneest know why and gets nervous  . Arthritis   . 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 CAD (coronary artery disease)   . Colon polyp    polypoid colorectal mucosa  . Complication of anesthesia   . Depression   . Diverticulosis   . DJD (degenerative joint disease)    left knee  . Esophagitis   . Gastritis   . Glaucoma   . Helicobacter pylori (H. pylori)   . Hemorrhoids   . Hernia of unspecified site of abdominal cavity without mention of obstruction or gangrene   .  History of blood transfusion   . History of kidney stones   . Hypertension 09 07 2013   TRANSTHORACIC ECHO STUDY CONCLUSIONS   . Hypertension 09 07 2013   EJECTION FRACTION- 55%-60% .WALL MOTION WAS NORMAL  . Impaired mobility   . Iron deficiency anemia   . Morbid obesity ([place])   . Neuropathy   . Obesity   . Olecranon bursitis of left elbow   . OSA (obstructive sleep apnea)    On cpap  . Osteoarthritis    right hip  . Osteoporosis   . Primary osteoarthritis    Bilaterally (knee)  . PSVT (paroxysmal supraventricular tachycardia) ([place])   . PSVT (paroxysmal supraventricular tachycardia) ([place])   . Short-term memory loss   . Shortness of breath dyspnea   . Sleep apnea    on CPAP- does not use machine  . Spinal headache 1986  . Type 2 diabetes mellitus ([place])     PAST SURGICAL HISTORY: Past Surgical History:  Procedure Laterality Date  . CARDIAC CATHETERIZATION  2003    normal coronary arteries and nomal LV function by cath performed by Dr.Peter Nishan  . CESAREAN SECTION  1986  . CHOLECYSTECTOMY    . ENTEROSCOPY  02/05/2012   Procedure: ENTEROSCOPY;  Surgeon: Lafayette Dragon, MD;  Location: [facility];  Service: Endoscopy;  Laterality: N/A;  . ESOPHAGOGASTRODUODENOSCOPY N/A 04/19/2014   Procedure: ESOPHAGOGASTRODUODENOSCOPY (EGD);  Surgeon: Lafayette Dragon, MD;  Location: [facility];  Service: Endoscopy;  Laterality: N/A;  . ESOPHAGOGASTRODUODENOSCOPY (EGD) WITH PROPOFOL N/A 08/01/2015   Procedure: ESOPHAGOGASTRODUODENOSCOPY (EGD) WITH PROPOFOL;  Surgeon: Mauri Pole, MD;  Location: [facility];  Service: Endoscopy;  Laterality: N/A;  . HERNIA REPAIR    . HOT HEMOSTASIS  02/05/2012   Procedure: HOT HEMOSTASIS (ARGON PLASMA COAGULATION/BICAP);  Surgeon: Lafayette Dragon, MD;  Location: [facility];  Service: Endoscopy;  Laterality: N/A;  . KNEE ARTHROSCOPY Left   . left sided lithotripsy    . TUBAL LIGATION      FAMILY HISTORY: Family History  Problem Relation Age of  Onset  . Heart disease Mother   . Heart disease Father   . Diabetes Brother   . Diabetes Sister   . Kidney cancer Brother   . Colon cancer Neg Hx     SOCIAL HISTORY: Social History   Socioeconomic History  . Marital status: Widowed    Spouse name: Not on file  . Number of children: 1  . Years of education: Not on file  . Highest education level: Some college, no  degree  Occupational History  . Occupation: RETIRED    Employer: RETIRED  Social Needs  . Financial resource strain: Not on file  . Food insecurity:    Worry: Not on file    Inability: Not on file  . Transportation needs:    Medical: Not on file    Non-medical: Not on file  Tobacco Use  . Smoking status: Never Smoker  . Smokeless tobacco: Never Used  Substance and Sexual Activity  . Alcohol use: Yes    Alcohol/week: 1.0 standard drinks    Types: 1 Glasses of wine per week    Comment: wine sometimes on holidays and bedtime- rarely  . Drug use: No  . Sexual activity: Not on file  Lifestyle  . Physical activity:    Days per week: Not on file    Minutes per session: Not on file  . Stress: Not on file  Relationships  . Social connections:    Talks on phone: Not on file    Gets together: Not on file    Attends religious service: Not on file    Active member of club or organization: Not on file    Attends meetings of clubs or organizations: Not on file    Relationship status: Not on file  . Intimate partner violence:    Fear of current or ex partner: Not on file    Emotionally abused: Not on file    Physically abused: Not on file    Forced sexual activity: Not on file  Other Topics Concern  . Not on file  Social History Narrative   04/29/18 Lives alone, dgtr visits, stays often   Caffeine- coffee once a week, sodas rarely     PHYSICAL EXAM  Video exam  GENERAL EXAM/CONSTITUTIONAL:  Vitals: There were no vitals filed for this visit.  There is no height or weight on file to calculate BMI. Wt  Readings from Last 3 Encounters:  04/28/18 300 lb (136.1 kg)  10/13/16 290 lb (131.5 kg)  09/18/15 280 lb (127 kg)     Patient is in no distress; well developed, nourished and groomed; neck is supple  NEUROLOGIC: MENTAL STATUS:  MMSE - Mini Mental State Exam 05/04/2018  Orientation to time 5  Orientation to Place 5  Registration 3  Attention/ Calculation 3  Recall 3  Language- name 2 objects 2  Language- repeat 1  Language- follow 3 step command 3  Language- read & follow direction 1  Write a sentence 1  Copy design 0  Total score 27    awake, alert, oriented to person, place and time  recent and remote memory intact  normal attention and concentration  language fluent, comprehension intact, naming intact  fund of knowledge appropriate  CRANIAL NERVE:   2nd, 3rd, 4th, 6th - visual fields full to confrontation, extraocular muscles intact, no nystagmus  5th - facial sensation symmetric  7th - facial strength symmetric  8th - hearing intact  11th - shoulder shrug symmetric  12th - tongue protrusion midline  MOTOR:   NO DRIFT; NO TREMOR  COORDINATION:   fine finger movements normal    DIAGNOSTIC DATA (LABS, IMAGING, TESTING) - I reviewed patient records, labs, notes, testing and imaging myself where available.  Lab Results  Component Value Date   WBC 6.2 12/21/2012   HGB 13.7 12/21/2012   HCT 42.6 12/21/2012   MCV 84.7 12/21/2012   PLT 164 12/21/2012      Component Value Date/Time   NA  139 10/13/2016 1405   NA 139 12/21/2012 1536   K 4.5 10/13/2016 1405   K 4.6 12/21/2012 1536   CL 99 10/13/2016 1405   CO2 24 10/13/2016 1405   CO2 18 (L) 12/21/2012 1536   GLUCOSE 116 (H) 10/13/2016 1405   GLUCOSE 105 12/21/2012 1536   BUN 25 10/13/2016 1405   BUN 25.9 12/21/2012 1536   CREATININE 1.17 (H) 10/13/2016 1405   CREATININE 1.2 (H) 12/21/2012 1536   CALCIUM 9.7 10/13/2016 1405   CALCIUM 9.8 12/21/2012 1536   PROT 6.8 10/13/2016 1405   PROT  7.6 12/21/2012 1536   ALBUMIN 4.2 10/13/2016 1405   ALBUMIN 4.0 12/21/2012 1536   AST 35 10/13/2016 1405   AST 17 12/21/2012 1536   ALT 37 (H) 10/13/2016 1405   ALT 14 12/21/2012 1536   ALKPHOS 68 10/13/2016 1405   ALKPHOS 71 12/21/2012 1536   BILITOT 0.8 10/13/2016 1405   BILITOT 1.11 12/21/2012 1536   GFRNONAA 47 (L) 10/13/2016 1405   GFRAA 54 (L) 10/13/2016 1405   No results found for: CHOL, HDL, LDLCALC, LDLDIRECT, TRIG, CHOLHDL No results found for: HGBA1C No results found for: VITAMINB12 Lab Results  Component Value Date   TSH 0.815 09/19/2011    09/19/11 MRI brain [I reviewed images myself and agree with interpretation. -VRP]  1. No acute intracranial abnormality. 2.  Mild for age nonspecific white matter signal changes, most commonly due to small vessel disease.    ASSESSMENT AND PLAN  73 y.o. year old female here with MILD memory loss since 2019.  Dx:  1. Memory loss     Virtual Visit via Video Note  I connected with Lavon Paganini on 05/04/18 at  4:30 PM EDT by a video enabled telemedicine application and verified that I am speaking with the correct person using two identifiers.  Patient was at home AND I was at the office.     I discussed the limitations of evaluation and management by telemedicine and the availability of in person appointments. The patient expressed understanding and agreed to proceed.  I discussed the assessment and treatment plan with the patient. The patient was provided an opportunity to ask questions and all were answered. The patient agreed with the plan and demonstrated an understanding of the instructions.   The patient was advised to call back or seek an in-person evaluation if the symptoms worsen or if the condition fails to improve as anticipated.  I provided 30 minutes of non-face-to-face time during this encounter.    PLAN:  SHORT TERM MEMORY LOSS (MCI vs normal aging; also mild depression, anxiety and chronic pain issues)  - consider MRI brain (patient wants to hold off) - consider sleep study - check B12, TSH (per PCP) - safety / supervision issues reviewed - caregiver resources provided - caution with driving and finances  Return in about 6 months (around 11/03/2018).    Penni Bombard, MD 0/17/5102, 5:85 PM Certified in Neurology, Neurophysiology and Neuroimaging  [facility] [address], [place], [place] [address] [phone]

## 2018-05-11 ENCOUNTER — Ambulatory Visit: Payer: Medicare Other | Admitting: Diagnostic Neuroimaging

## 2018-05-18 ENCOUNTER — Encounter: Payer: Self-pay | Admitting: General Surgery

## 2018-05-19 ENCOUNTER — Encounter: Payer: Self-pay | Admitting: Gastroenterology

## 2018-05-19 ENCOUNTER — Ambulatory Visit (INDEPENDENT_AMBULATORY_CARE_PROVIDER_SITE_OTHER): Payer: Medicare Other | Admitting: Gastroenterology

## 2018-05-19 ENCOUNTER — Other Ambulatory Visit: Payer: Self-pay

## 2018-05-19 VITALS — Ht 63.0 in | Wt 300.0 lb

## 2018-05-19 DIAGNOSIS — E739 Lactose intolerance, unspecified: Secondary | ICD-10-CM

## 2018-05-19 DIAGNOSIS — K219 Gastro-esophageal reflux disease without esophagitis: Secondary | ICD-10-CM | POA: Diagnosis not present

## 2018-05-19 DIAGNOSIS — K580 Irritable bowel syndrome with diarrhea: Secondary | ICD-10-CM

## 2018-05-19 NOTE — Patient Instructions (Addendum)
Stop the probiotic Florastor once done with the capsules she already filled  Continue lactose-free diet  Continue omeprazole  Antireflux measures   Gastroesophageal Reflux Disease, Adult Gastroesophageal reflux (GER) happens when acid from the stomach flows up into the tube that connects the mouth and the stomach (esophagus). Normally, food travels down the esophagus and stays in the stomach to be digested. However, when a person has GER, food and stomach acid sometimes move back up into the esophagus. If this becomes a more serious problem, the person may be diagnosed with a disease called gastroesophageal reflux disease (GERD). GERD occurs when the reflux:  Happens often.  Causes frequent or severe symptoms.  Causes problems such as damage to the esophagus. When stomach acid comes in contact with the esophagus, the acid may cause soreness (inflammation) in the esophagus. Over time, GERD may create small holes (ulcers) in the lining of the esophagus. What are the causes? This condition is caused by a problem with the muscle between the esophagus and the stomach (lower esophageal sphincter, or LES). Normally, the LES muscle closes after food passes through the esophagus to the stomach. When the LES is weakened or abnormal, it does not close properly, and that allows food and stomach acid to go back up into the esophagus. The LES can be weakened by certain dietary substances, medicines, and medical conditions, including:  Tobacco use.  Pregnancy.  Having a hiatal hernia.  Alcohol use.  Certain foods and beverages, such as coffee, chocolate, onions, and peppermint. What increases the risk? You are more likely to develop this condition if you:  Have an increased body weight.  Have a connective tissue disorder.  Use NSAID medicines. What are the signs or symptoms? Symptoms of this condition include:  Heartburn.  Difficult or painful swallowing.  The feeling of having a lump  in the throat.  Abitter taste in the mouth.  Bad breath.  Having a large amount of saliva.  Having an upset or bloated stomach.  Belching.  Chest pain. Different conditions can cause chest pain. Make sure you see your health care provider if you experience chest pain.  Shortness of breath or wheezing.  Ongoing (chronic) cough or a night-time cough.  Wearing away of tooth enamel.  Weight loss. How is this diagnosed? Your health care provider will take a medical history and perform a physical exam. To determine if you have mild or severe GERD, your health care provider may also monitor how you respond to treatment. You may also have tests, including:  A test to examine your stomach and esophagus with a small camera (endoscopy).  A test thatmeasures the acidity level in your esophagus.  A test thatmeasures how much pressure is on your esophagus.  A barium swallow or modified barium swallow test to show the shape, size, and functioning of your esophagus. How is this treated? The goal of treatment is to help relieve your symptoms and to prevent complications. Treatment for this condition may vary depending on how severe your symptoms are. Your health care provider may recommend:  Changes to your diet.  Medicine.  Surgery. Follow these instructions at home: Eating and drinking   Follow a diet as recommended by your health care provider. This may involve avoiding foods and drinks such as: ? Coffee and tea (with or without caffeine). ? Drinks that containalcohol. ? Energy drinks and sports drinks. ? Carbonated drinks or sodas. ? Chocolate and cocoa. ? Peppermint and mint flavorings. ? Garlic and onions. ?  Horseradish. ? Spicy and acidic foods, including peppers, chili powder, curry powder, vinegar, hot sauces, and barbecue sauce. ? Citrus fruit juices and citrus fruits, such as oranges, lemons, and limes. ? Tomato-based foods, such as red sauce, chili, salsa, and  pizza with red sauce. ? Fried and fatty foods, such as donuts, french fries, potato chips, and high-fat dressings. ? High-fat meats, such as hot dogs and fatty cuts of red and white meats, such as rib eye steak, sausage, ham, and bacon. ? High-fat dairy items, such as whole milk, butter, and cream cheese.  Eat small, frequent meals instead of large meals.  Avoid drinking large amounts of liquid with your meals.  Avoid eating meals during the 2-3 hours before bedtime.  Avoid lying down right after you eat.  Do not exercise right after you eat. Lifestyle   Do not use any products that contain nicotine or tobacco, such as cigarettes, e-cigarettes, and chewing tobacco. If you need help quitting, ask your health care provider.  Try to reduce your stress by using methods such as yoga or meditation. If you need help reducing stress, ask your health care provider.  If you are overweight, reduce your weight to an amount that is healthy for you. Ask your health care provider for guidance about a safe weight loss goal. General instructions  Pay attention to any changes in your symptoms.  Take over-the-counter and prescription medicines only as told by your health care provider. Do not take aspirin, ibuprofen, or other NSAIDs unless your health care provider told you to do so.  Wear loose-fitting clothing. Do not wear anything tight around your waist that causes pressure on your abdomen.  Raise (elevate) the head of your bed about 6 inches (15 cm).  Avoid bending over if this makes your symptoms worse.  Keep all follow-up visits as told by your health care provider. This is important. Contact a health care provider if:  You have: ? New symptoms. ? Unexplained weight loss. ? Difficulty swallowing or it hurts to swallow. ? Wheezing or a persistent cough. ? A hoarse voice.  Your symptoms do not improve with treatment. Get help right away if you:  Have pain in your arms, neck, jaw,  teeth, or back.  Feel sweaty, dizzy, or light-headed.  Have chest pain or shortness of breath.  Vomit and your vomit looks like blood or coffee grounds.  Faint.  Have stool that is bloody or black.  Cannot swallow, drink, or eat. Summary  Gastroesophageal reflux happens when acid from the stomach flows up into the esophagus. GERD is a disease in which the reflux happens often, causes frequent or severe symptoms, or causes problems such as damage to the esophagus.  Treatment for this condition may vary depending on how severe your symptoms are. Your health care provider may recommend diet and lifestyle changes, medicine, or surgery.  Contact a health care provider if you have new or worsening symptoms.  Take over-the-counter and prescription medicines only as told by your health care provider. Do not take aspirin, ibuprofen, or other NSAIDs unless your health care provider told you to do so.  Keep all follow-up visits as told by your health care provider. This is important. This information is not intended to replace advice given to you by your health care provider. Make sure you discuss any questions you have with your health care provider. Document Released: 10/09/2004 Document Revised: 07/08/2017 Document Reviewed: 07/08/2017 Elsevier Interactive Patient Education  2019 [employer].  Follow-up  in 6 months  I appreciate the  opportunity to care for you  Thank You   Harl Bowie , MD

## 2018-05-19 NOTE — Progress Notes (Signed)
Jade Ellison    [id number]    September 30, 1945  Primary Care Physician:Gordon, Cherylann Ratel, PA-C  Referring Physician: No referring provider defined for this encounter.  This service was provided via only audio telemedicine (Doximity) due to [place] 19 pandemic.  Patient location: Home Provider location: Office Used 2 patient identifiers to confirm the correct person. Explained the limitations in evaluation and management via telemedicine. Patient is aware of potential medical charges for this visit.  Patient consented to this virtual visit.  The persons participating in this telemedicine service were myself and the patient  Interactive audio and video telecommunications were attempted between this provider and patient, however failed, due to patient having technical difficulties OR patient did not have access to video capability. We continued and completed visit with audio only.    Chief complaint: Irritable bowel syndrome  HPI: 73 year old female with history of chronic GERD, lactose intolerance and irritable bowel syndrome Post antibiotic diarrhea, postinfectious IBS symptoms improved with probiotic and lactose-free diet. She is currently having 1-2 soft formed stool every day. Denies any nausea, vomiting, abdominal pain, melena or bright red blood per rectum   Relevant GI history:  EGD April 2016 negative for Barrett's esophagus, gastric biopsies showed focal intestinal metaplasia. EGD July 2017 normal Colonoscopy October 2012 normal with no polyps  Outpatient Encounter Medications as of 05/19/2018  Medication Sig  . albuterol (PROVENTIL HFA;VENTOLIN HFA) 108 (90 Base) MCG/ACT inhaler Inhale into the lungs.  . ALPRAZolam (XANAX) 0.25 MG tablet 0.25 mg. Take 1 tablet (0.25 mg total) by mouth at bedtime as needed for Sleep or Anxiety.  Marland Kitchen aspirin EC 81 MG tablet Take 162 mg by mouth daily.  Marland Kitchen atorvastatin (LIPITOR) 40 MG tablet Take by mouth.  . desonide (DESOWEN)  0.05 % cream Apply topically 2 (two) times daily.  . diclofenac sodium (VOLTAREN) 1 % GEL Apply 2 g topically 4 (four) times daily.  Marland Kitchen dicyclomine (BENTYL) 20 MG tablet TAKE 1 TABLET(20 MG) BY MOUTH THREE TIMES DAILY AFTER MEALS  . DULoxetine (CYMBALTA) 60 MG capsule Take 60 mg by mouth daily.  Marland Kitchen glipiZIDE (GLUCOTROL) 10 MG tablet Take 10 mg by mouth daily before breakfast.  . HYDROcodone-acetaminophen (NORCO) 7.5-325 MG per tablet TK 1 T PO  Q 8 H AS NEEDED FOR PAIN  . latanoprost (XALATAN) 0.005 % ophthalmic solution Place 1 drop into both eyes daily.   Marland Kitchen losartan-hydrochlorothiazide (HYZAAR) 100-12.5 MG tablet Take 1 tablet by mouth daily.  . metFORMIN (GLUCOPHAGE) 1000 MG tablet Take 1,000 mg by mouth 2 (two) times daily with a meal.   . Mouthwashes (BIOTENE/CALCIUM MT) Take 1 tablet by mouth daily. Use as directed 1 tablet in the mouth or throat daily.  . OMEGA 3 1000 MG CAPS Take 1 capsule by mouth daily.   Marland Kitchen omeprazole (PRILOSEC) 40 MG capsule TAKE 1 CAPSULE(40 MG) BY MOUTH DAILY  . psyllium (METAMUCIL SMOOTH TEXTURE) 28 % packet Take 1 packet by mouth daily. Take 1 packet by mouth 2 (two) times daily.  . vitamin B-12 (CYANOCOBALAMIN) 50 MCG tablet Take 50 mcg by mouth daily.  . calcium-vitamin D (OSCAL WITH D) 500-200 MG-UNIT per tablet Take 1 tablet by mouth daily. Take 1 tablet by mouth daily.  . metoprolol tartrate (LOPRESSOR) 25 MG tablet Take 50 mg by mouth 2 (two) times daily.    No facility-administered encounter medications on file as of 05/19/2018.     Allergies as of 05/19/2018 -  Review Complete 05/18/2018  Allergen Reaction Noted  . Fosamax [alendronate sodium] Anaphylaxis 07/21/2014  . Codeine Rash 10/02/2010  . Nsaids  02/04/2011    Past Medical History:  Diagnosis Date  . Adult stuttering   . Anemia   . Anxiety    Panic attack- - 1 year ago (cries - doneest know why and gets nervous  . Arthritis   . 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 CAD (coronary artery disease)   .  Colon polyp    polypoid colorectal mucosa  . Complication of anesthesia   . Depression   . Diverticulosis   . DJD (degenerative joint disease)    left knee  . Esophagitis   . Gastritis   . Glaucoma   . Helicobacter pylori (H. pylori)   . Hemorrhoids   . Hernia of unspecified site of abdominal cavity without mention of obstruction or gangrene   . History of blood transfusion   . History of kidney stones   . Hypertension 09 07 2013   TRANSTHORACIC ECHO STUDY CONCLUSIONS   . Hypertension 09 07 2013   EJECTION FRACTION- 55%-60% .WALL MOTION WAS NORMAL  . Impaired mobility   . Iron deficiency anemia   . Morbid obesity ([place])   . Neuropathy   . Obesity   . Olecranon bursitis of left elbow   . OSA (obstructive sleep apnea)    On cpap  . Osteoarthritis    right hip  . Osteoporosis   . Primary osteoarthritis    Bilaterally (knee)  . PSVT (paroxysmal supraventricular tachycardia) ([place])   . PSVT (paroxysmal supraventricular tachycardia) ([place])   . Short-term memory loss   . Shortness of breath dyspnea   . Sleep apnea    on CPAP- does not use machine  . Spinal headache 1986  . Type 2 diabetes mellitus ([place])     Past Surgical History:  Procedure Laterality Date  . CARDIAC CATHETERIZATION  2003    normal coronary arteries and nomal LV function by cath performed by Dr.Peter Nishan  . CESAREAN SECTION  1986  . CHOLECYSTECTOMY    . ENTEROSCOPY  02/05/2012   Procedure: ENTEROSCOPY;  Surgeon: Lafayette Dragon, MD;  Location: [facility];  Service: Endoscopy;  Laterality: N/A;  . ESOPHAGOGASTRODUODENOSCOPY N/A 04/19/2014   Procedure: ESOPHAGOGASTRODUODENOSCOPY (EGD);  Surgeon: Lafayette Dragon, MD;  Location: [facility];  Service: Endoscopy;  Laterality: N/A;  . ESOPHAGOGASTRODUODENOSCOPY (EGD) WITH PROPOFOL N/A 08/01/2015   Procedure: ESOPHAGOGASTRODUODENOSCOPY (EGD) WITH PROPOFOL;  Surgeon: Mauri Pole, MD;  Location: [facility];  Service: Endoscopy;  Laterality: N/A;  . HERNIA  REPAIR    . HOT HEMOSTASIS  02/05/2012   Procedure: HOT HEMOSTASIS (ARGON PLASMA COAGULATION/BICAP);  Surgeon: Lafayette Dragon, MD;  Location: [facility];  Service: Endoscopy;  Laterality: N/A;  . KNEE ARTHROSCOPY Left   . left sided lithotripsy    . TUBAL LIGATION      Family History  Problem Relation Age of Onset  . Heart disease Mother   . Heart disease Father   . Diabetes Brother   . Diabetes Sister   . Kidney cancer Brother   . Colon cancer Neg Hx     Social History   Socioeconomic History  . Marital status: Widowed    Spouse name: Not on file  . Number of children: 1  . Years of education: Not on file  . Highest education level: Some college, no degree  Occupational History  . Occupation: RETIRED    Employer: RETIRED  Social Needs  . Financial resource strain: Not on file  . Food insecurity:    Worry: Not on file    Inability: Not on file  . Transportation needs:    Medical: Not on file    Non-medical: Not on file  Tobacco Use  . Smoking status: Never Smoker  . Smokeless tobacco: Never Used  Substance and Sexual Activity  . Alcohol use: Yes    Alcohol/week: 1.0 standard drinks    Types: 1 Glasses of wine per week    Comment: wine sometimes on holidays and bedtime- rarely  . Drug use: No  . Sexual activity: Not on file  Lifestyle  . Physical activity:    Days per week: Not on file    Minutes per session: Not on file  . Stress: Not on file  Relationships  . Social connections:    Talks on phone: Not on file    Gets together: Not on file    Attends religious service: Not on file    Active member of club or organization: Not on file    Attends meetings of clubs or organizations: Not on file    Relationship status: Not on file  . Intimate partner violence:    Fear of current or ex partner: Not on file    Emotionally abused: Not on file    Physically abused: Not on file    Forced sexual activity: Not on file  Other Topics Concern  . Not on file   Social History Narrative   04/29/18 Lives alone, dgtr visits, stays often   Caffeine- coffee once a week, sodas rarely      Review of systems: Review of Systems as per HPI All other systems reviewed and are negative.   Physical Exam: Vitals were not taken and physical exam was not performed during this virtual visit.  Data Reviewed:  Reviewed labs, radiology imaging, old records and pertinent past GI work up   Assessment and Plan/Recommendations:  73 year old female with history of chronic GERD, lactose intolerance and irritable bowel syndrome Continue omeprazole and antireflux measures for GERD Continue probiotic for additional 2 weeks and stop after that Lactose-free diet Follow-up in 1 year or sooner if needed    K. Denzil Magnuson , MD   CC: No ref. provider found

## 2018-05-27 ENCOUNTER — Telehealth: Payer: Self-pay | Admitting: Cardiovascular Disease

## 2018-05-27 NOTE — Telephone Encounter (Signed)
Mychart pending, smartphone, consent (verbal), pre reg complete 05/27/18 AF

## 2018-05-28 ENCOUNTER — Encounter: Payer: Self-pay | Admitting: Cardiovascular Disease

## 2018-05-28 ENCOUNTER — Telehealth: Payer: Self-pay

## 2018-05-28 ENCOUNTER — Telehealth (INDEPENDENT_AMBULATORY_CARE_PROVIDER_SITE_OTHER): Payer: Medicare Other | Admitting: Cardiovascular Disease

## 2018-05-28 DIAGNOSIS — G4730 Sleep apnea, unspecified: Secondary | ICD-10-CM

## 2018-05-28 DIAGNOSIS — I10 Essential (primary) hypertension: Secondary | ICD-10-CM | POA: Diagnosis not present

## 2018-05-28 DIAGNOSIS — E785 Hyperlipidemia, unspecified: Secondary | ICD-10-CM

## 2018-05-28 NOTE — Progress Notes (Signed)
Virtual Visit via Video Note   This visit type was conducted due to national recommendations for restrictions regarding the COVID-19 Pandemic (e.g. social distancing) in an effort to limit this patient's exposure and mitigate transmission in our community.  Due to her co-morbid illnesses, this patient is at least at moderate risk for complications without adequate follow up.  This format is felt to be most appropriate for this patient at this time.  All issues noted in this document were discussed and addressed.  A limited physical exam was performed with this format.  Please refer to the patient's chart for her consent to telehealth for [facility].   Date:  05/28/2018   ID:  Jade Ellison, DOB 09/24/1945, MRN [id number]  Patient Location: Home Provider Location: Home  PCP:  Jade Curling, PA-C  Cardiologist: Dr. Quay Ellison Electrophysiologist:  None   Evaluation Performed:  Follow-Up Visit  Chief Complaint: Follow-up hypertension and hyperlipidemia  History of Present Illness:    The patient is a 73 year old, moderate to severely overweight, widowed Jade Ellison female, mother of two, grandmother to one grandchild, who is accompanied by one of her daughters Jade Ellison today. I last saw her in the office 09/18/2015. She has a history of normal coronary arteries and normal LV function by cath performed by Jade Ellison in 2003. Her other problems include obstructive sleep apnea currently weighing CPAP, hypertension, hyperlipidemia, and type 2 diabetes. She is otherwise asymptomatic. Since I saw her one year ago she's remained currently stable. She denies chest pain and has chronic dyspnea on exertion which has not changed in severity.  Since I saw her 3 years ago she is done well.  She did see Jade Ellison 10/13/2016.  She is for the most part wheelchair-bound because of obesity and DJD although does walk around the house with a walker.  She denies chest pain or shortness of  breath.  She is sheltering in place and socially distancing.  The patient does not have symptoms concerning for COVID-19 infection (fever, chills, cough, or new shortness of breath).    Past Medical History:  Diagnosis Date  . Adult stuttering   . Anemia   . Anxiety    Panic attack- - 1 year ago (cries - doneest know why and gets nervous  . Arthritis   . 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 CAD (coronary artery disease)   . Colon polyp    polypoid colorectal mucosa  . Complication of anesthesia   . Depression   . Diverticulosis   . DJD (degenerative joint disease)    left knee  . Esophagitis   . Gastritis   . Glaucoma   . Helicobacter pylori (H. pylori)   . Hemorrhoids   . Hernia of unspecified site of abdominal cavity without mention of obstruction or gangrene   . History of blood transfusion   . History of kidney stones   . Hypertension 09 07 2013   TRANSTHORACIC ECHO STUDY CONCLUSIONS   . Hypertension 09 07 2013   EJECTION FRACTION- 55%-60% .WALL MOTION WAS NORMAL  . Impaired mobility   . Iron deficiency anemia   . Morbid obesity ([place])   . Neuropathy   . Obesity   . Olecranon bursitis of left elbow   . OSA (obstructive sleep apnea)    On cpap  . Osteoarthritis    right hip  . Osteoporosis   . Primary osteoarthritis    Bilaterally (knee)  . PSVT (paroxysmal supraventricular tachycardia) ([place])   .  PSVT (paroxysmal supraventricular tachycardia) ([place])   . Short-term memory loss   . Shortness of breath dyspnea   . Sleep apnea    on CPAP- does not use machine  . Spinal headache 1986  . Type 2 diabetes mellitus ([place])    Past Surgical History:  Procedure Laterality Date  . CARDIAC CATHETERIZATION  2003    normal coronary arteries and nomal LV function by cath performed by JadePeter Nishan  . CESAREAN SECTION  1986  . CHOLECYSTECTOMY    . ENTEROSCOPY  02/05/2012   Procedure: ENTEROSCOPY;  Surgeon: Lafayette Dragon, MD;  Location: [facility];  Service: Endoscopy;   Laterality: N/A;  . ESOPHAGOGASTRODUODENOSCOPY N/A 04/19/2014   Procedure: ESOPHAGOGASTRODUODENOSCOPY (EGD);  Surgeon: Lafayette Dragon, MD;  Location: [facility];  Service: Endoscopy;  Laterality: N/A;  . ESOPHAGOGASTRODUODENOSCOPY (EGD) WITH PROPOFOL N/A 08/01/2015   Procedure: ESOPHAGOGASTRODUODENOSCOPY (EGD) WITH PROPOFOL;  Surgeon: Mauri Pole, MD;  Location: [facility];  Service: Endoscopy;  Laterality: N/A;  . HERNIA REPAIR    . HOT HEMOSTASIS  02/05/2012   Procedure: HOT HEMOSTASIS (ARGON PLASMA COAGULATION/BICAP);  Surgeon: Lafayette Dragon, MD;  Location: [facility];  Service: Endoscopy;  Laterality: N/A;  . KNEE ARTHROSCOPY Left   . left sided lithotripsy    . TUBAL LIGATION       Current Meds  Medication Sig  . ALPRAZolam (XANAX) 0.25 MG tablet 0.25 mg. Take 1 tablet (0.25 mg total) by mouth at bedtime as needed for Sleep or Anxiety.  Marland Kitchen aspirin EC 81 MG tablet Take 162 mg by mouth daily.  Marland Kitchen atorvastatin (LIPITOR) 40 MG tablet Take 40 mg by mouth daily.   . calcium-vitamin D (OSCAL WITH D) 500-200 MG-UNIT per tablet Take 1 tablet by mouth daily. Take 1 tablet by mouth daily.  Marland Kitchen desonide (DESOWEN) 0.05 % cream Apply topically 2 (two) times daily.  . diclofenac sodium (VOLTAREN) 1 % GEL Apply 2 g topically 4 (four) times daily.  Marland Kitchen dicyclomine (BENTYL) 20 MG tablet TAKE 1 TABLET(20 MG) BY MOUTH THREE TIMES DAILY AFTER MEALS  . DULoxetine (CYMBALTA) 60 MG capsule Take 60 mg by mouth daily.  Marland Kitchen glipiZIDE (GLUCOTROL) 10 MG tablet Take 10 mg by mouth daily before breakfast.  . HYDROcodone-acetaminophen (NORCO) 7.5-325 MG per tablet TK 1 T PO  Q 8 H AS NEEDED FOR PAIN  . latanoprost (XALATAN) 0.005 % ophthalmic solution Place 1 drop into both eyes daily.   Marland Kitchen losartan-hydrochlorothiazide (HYZAAR) 100-12.5 MG tablet Take 1 tablet by mouth daily.  . metFORMIN (GLUCOPHAGE) 1000 MG tablet Take 1,000 mg by mouth 2 (two) times daily with a meal.   . metoprolol tartrate (LOPRESSOR) 25 MG  tablet Take 50 mg by mouth 2 (two) times daily.   . Mouthwashes (BIOTENE/CALCIUM MT) Take 1 tablet by mouth daily. Use as directed 1 tablet in the mouth or throat daily.  . OMEGA 3 1000 MG CAPS Take 1 capsule by mouth daily.   Marland Kitchen omeprazole (PRILOSEC) 40 MG capsule TAKE 1 CAPSULE(40 MG) BY MOUTH DAILY  . psyllium (METAMUCIL SMOOTH TEXTURE) 28 % packet Take 1 packet by mouth daily. Take 1 packet by mouth 2 (two) times daily.  . vitamin B-12 (CYANOCOBALAMIN) 50 MCG tablet Take 50 mcg by mouth daily.     Allergies:   Fosamax [alendronate sodium]; Codeine; and Nsaids   Social History   Tobacco Use  . Smoking status: Never Smoker  . Smokeless tobacco: Never Used  Substance Use Topics  . Alcohol  use: Yes    Alcohol/week: 1.0 standard drinks    Types: 1 Glasses of wine per week    Comment: wine sometimes on holidays and bedtime- rarely  . Drug use: No     Family Hx: The patient's family history includes Diabetes in her brother and sister; Heart disease in her father and mother; Kidney cancer in her brother. There is no history of Colon cancer.  ROS:   Please see the history of present illness.     All other systems reviewed and are negative.   Prior CV studies:   The following studies were reviewed today:  None  Labs/Other Tests and Data Reviewed:    EKG:  No ECG reviewed.  Recent Labs: No results found for requested labs within last 8760 hours.   Recent Lipid Panel No results found for: CHOL, TRIG, HDL, CHOLHDL, LDLCALC, LDLDIRECT  Wt Readings from Last 3 Encounters:  05/28/18 283 lb (128.4 kg)  05/19/18 300 lb (136.1 kg)  05/18/18 300 lb (136.1 kg)     Objective:    Vital Signs:  BP 138/78   Ht 5\' 3"  (1.6 m)   Wt 283 lb (128.4 kg)   BMI 50.13 kg/m    VITAL SIGNS:  reviewed GEN:  no acute distress RESPIRATORY:  normal respiratory effort, symmetric expansion NEURO:  alert and oriented x 3, no obvious focal deficit PSYCH:  normal affect  ASSESSMENT & PLAN:     1. Essential hypertension- history of essential hypertension with blood pressure measured today by the patient of 138/78.  She is on losartan, hydrochlorothiazide and metoprolol 2. Hyperlipidemia- on atorvastatin followed by her PCP 3. Obstructive sleep apnea- was on CPAP in the past but not currently 4. Morbid obesity  COVID-19 Education: The signs and symptoms of COVID-19 were discussed with the patient and how to seek care for testing (follow up with PCP or arrange E-visit).  The importance of social distancing was discussed today.  Time:   Today, I have spent 8 minutes with the patient with telehealth technology discussing the above problems.     Medication Adjustments/Labs and Tests Ordered: Current medicines are reviewed at length with the patient today.  Concerns regarding medicines are outlined above.   Tests Ordered: No orders of the defined types were placed in this encounter.   Medication Changes: No orders of the defined types were placed in this encounter.   Disposition:  Follow up in 1 year(s)  Signed, Jade Burow, MD  05/28/2018 3:41 PM    [place]

## 2018-05-28 NOTE — Patient Instructions (Addendum)
Medication Instructions:  Your physician recommends that you continue on your current medications as directed. Please refer to the Current Medication list given to you today.  If you need a refill on your cardiac medications before your next appointment, please call your pharmacy.   Lab work: NONE If you have labs (blood work) drawn today and your tests are completely normal, you will receive your results only by: Marland Kitchen MyChart Message (if you have MyChart) OR . A paper copy in the mail If you have any lab test that is abnormal or we need to change your treatment, we will call you to review the results.  Testing/Procedures: NONE  Follow-Up: At [facility], you and your health needs are our priority.  As part of our continuing mission to provide you with exceptional heart care, we have created designated Provider Care Teams.  These Care Teams include your primary Cardiologist (physician) and Advanced Practice Providers (APPs -  Physician Assistants and Nurse Practitioners) who all work together to provide you with the care you need, when you need it. You will need a follow up appointment in 12 months WITH DR. Gwenlyn Found.  Please call our office 2 months in advance to schedule this appointment.    Any Other Special Instructions Will Be Listed Below (If Applicable).   Heart-Healthy Eating Plan Many factors influence your heart (coronary) health, including eating and exercise habits. Coronary risk increases with abnormal blood fat (lipid) levels. Heart-healthy meal planning includes limiting unhealthy fats, increasing healthy fats, and making other diet and lifestyle changes.  What are tips for following this plan? Cooking Cook foods using methods other than frying. Baking, boiling, grilling, and broiling are all good options. Other ways to reduce fat include:  Removing the skin from poultry.  Removing all visible fats from meats.  Steaming vegetables in water or broth. Meal planning   At  meals, imagine dividing your plate into fourths: ? Fill one-half of your plate with vegetables and green salads. ? Fill one-fourth of your plate with whole grains. ? Fill one-fourth of your plate with lean protein foods.  Eat 4-5 servings of vegetables per day. One serving equals 1 cup raw or cooked vegetable, or 2 cups raw leafy greens.  Eat 4-5 servings of fruit per day. One serving equals 1 medium whole fruit,  cup dried fruit,  cup fresh, frozen, or canned fruit, or  cup 100% fruit juice.  Eat more foods that contain soluble fiber. Examples include apples, broccoli, carrots, beans, peas, and barley. Aim to get 25-30 g of fiber per day.  Increase your consumption of legumes, nuts, and seeds to 4-5 servings per week. One serving of dried beans or legumes equals  cup cooked, 1 serving of nuts is  cup, and 1 serving of seeds equals 1 tablespoon. Fats  Choose healthy fats more often. Choose monounsaturated and polyunsaturated fats, such as olive and canola oils, flaxseeds, walnuts, almonds, and seeds.  Eat more omega-3 fats. Choose salmon, mackerel, sardines, tuna, flaxseed oil, and ground flaxseeds. Aim to eat fish at least 2 times each week.  Check food labels carefully to identify foods with trans fats or high amounts of saturated fat.  Limit saturated fats. These are found in animal products, such as meats, butter, and cream. Plant sources of saturated fats include palm oil, palm kernel oil, and coconut oil.  Avoid foods with partially hydrogenated oils in them. These contain trans fats. Examples are stick margarine, some tub margarines, cookies, crackers, and other baked  goods.  Avoid fried foods. General information  Eat more home-cooked food and less restaurant, buffet, and fast food.  Limit or avoid alcohol.  Limit foods that are high in starch and sugar.  Lose weight if you are overweight. Losing just 5-10% of your body weight can help your overall health and prevent  diseases such as diabetes and heart disease.  Monitor your salt (sodium) intake, especially if you have high blood pressure. Talk with your health care provider about your sodium intake.  Try to incorporate more vegetarian meals weekly. What foods can I eat? Fruits All fresh, canned (in natural juice), or frozen fruits. Vegetables Fresh or frozen vegetables (raw, steamed, roasted, or grilled). Green salads. Grains Most grains. Choose whole wheat and whole grains most of the time. Rice and pasta, including brown rice and pastas made with whole wheat. Meats and other proteins Lean, well-trimmed beef, veal, pork, and lamb. Chicken and [place] without skin. All fish and shellfish. Wild duck, rabbit, pheasant, and venison. Egg whites or low-cholesterol egg substitutes. Dried beans, peas, lentils, and tofu. Seeds and most nuts. Dairy Low-fat or nonfat cheeses, including ricotta and mozzarella. Skim or 1% milk (liquid, powdered, or evaporated). Buttermilk made with low-fat milk. Nonfat or low-fat yogurt. Fats and oils Non-hydrogenated (trans-free) margarines. Vegetable oils, including soybean, sesame, sunflower, olive, peanut, safflower, corn, canola, and cottonseed. Salad dressings or mayonnaise made with a vegetable oil. Beverages Water (mineral or sparkling). Coffee and tea. Diet carbonated beverages. Sweets and desserts Sherbet, gelatin, and fruit ice. Small amounts of dark chocolate. Limit all sweets and desserts. Seasonings and condiments All seasonings and condiments. The items listed above may not be a complete list of foods and beverages you can eat. Contact a dietitian for more options. What foods are not recommended? Fruits Canned fruit in heavy syrup. Fruit in cream or butter sauce. Fried fruit. Limit coconut. Vegetables Vegetables cooked in cheese, cream, or butter sauce. Fried vegetables. Grains Breads made with saturated or trans fats, oils, or whole milk. Croissants. Sweet  rolls. Donuts. High-fat crackers, such as cheese crackers. Meats and other proteins Fatty meats, such as hot dogs, ribs, sausage, bacon, rib-eye roast or steak. High-fat deli meats, such as salami and bologna. Caviar. Domestic duck and goose. Organ meats, such as liver. Dairy Cream, sour cream, cream cheese, and creamed cottage cheese. Whole milk cheeses. Whole or 2% milk (liquid, evaporated, or condensed). Whole buttermilk. Cream sauce or high-fat cheese sauce. Whole-milk yogurt. Fats and oils Meat fat, or shortening. Cocoa butter, hydrogenated oils, palm oil, coconut oil, palm kernel oil. Solid fats and shortenings, including bacon fat, salt pork, lard, and butter. Nondairy cream substitutes. Salad dressings with cheese or sour cream. Beverages Regular sodas and any drinks with added sugar. Sweets and desserts Frosting. Pudding. Cookies. Cakes. Pies. Milk chocolate or white chocolate. Buttered syrups. Full-fat ice cream or ice cream drinks. The items listed above may not be a complete list of foods and beverages to avoid. Contact a dietitian for more information. Summary  Heart-healthy meal planning includes limiting unhealthy fats, increasing healthy fats, and making other diet and lifestyle changes.  Lose weight if you are overweight. Losing just 5-10% of your body weight can help your overall health and prevent diseases such as diabetes and heart disease.  Focus on eating a balance of foods, including fruits and vegetables, low-fat or nonfat dairy, lean protein, nuts and legumes, whole grains, and heart-healthy oils and fats.

## 2018-05-28 NOTE — Telephone Encounter (Signed)
Patient and/or DPR-approved person aware of AVS instructions and verbalized understanding. Letter including After Visit Summary and any other necessary documents to be mailed to the patient's address on file.  

## 2018-07-13 ENCOUNTER — Telehealth: Payer: Self-pay | Admitting: Gastroenterology

## 2018-07-13 NOTE — Telephone Encounter (Signed)
Pt scheduled to see Dr. Silverio Decamp 07/27/18@10 :30am, pts daughter aware of appt.

## 2018-07-13 NOTE — Telephone Encounter (Signed)
Pt's daughter Georgina Peer had labs last week with PCP Dr. Araceli Bouche at [place], her Hemoglobin is at 11 so PCP wants pt to be evaluated for possible GI bleed. Dr. Silverio Decamp does not have anything available until August. Pt's daughter is requesting a sooner appointment. Pls call her.

## 2018-07-26 ENCOUNTER — Telehealth: Payer: Self-pay | Admitting: *Deleted

## 2018-07-26 NOTE — Telephone Encounter (Signed)
Covid-19 screening questions   Do you now or have you had a fever in the last 14 days?  Do you have any respiratory symptoms of shortness of breath or cough now or in the last 14 days?  Do you have any family members or close contacts with diagnosed or suspected Covid-19 in the past 14 days?  Have you been tested for Covid-19 and found to be positive?       

## 2018-07-27 ENCOUNTER — Other Ambulatory Visit (INDEPENDENT_AMBULATORY_CARE_PROVIDER_SITE_OTHER): Payer: Medicare Other

## 2018-07-27 ENCOUNTER — Other Ambulatory Visit: Payer: Self-pay

## 2018-07-27 ENCOUNTER — Encounter: Payer: Self-pay | Admitting: Gastroenterology

## 2018-07-27 ENCOUNTER — Ambulatory Visit (INDEPENDENT_AMBULATORY_CARE_PROVIDER_SITE_OTHER): Payer: Medicare Other | Admitting: Gastroenterology

## 2018-07-27 VITALS — BP 130/70 | HR 75 | Temp 97.4°F | Ht 63.0 in | Wt 288.0 lb

## 2018-07-27 DIAGNOSIS — R152 Fecal urgency: Secondary | ICD-10-CM

## 2018-07-27 DIAGNOSIS — D508 Other iron deficiency anemias: Secondary | ICD-10-CM | POA: Diagnosis not present

## 2018-07-27 DIAGNOSIS — R159 Full incontinence of feces: Secondary | ICD-10-CM | POA: Diagnosis not present

## 2018-07-27 DIAGNOSIS — K580 Irritable bowel syndrome with diarrhea: Secondary | ICD-10-CM | POA: Diagnosis not present

## 2018-07-27 LAB — CBC WITH DIFFERENTIAL/PLATELET
Basophils Absolute: 0 10*3/uL (ref 0.0–0.1)
Basophils Relative: 0.7 % (ref 0.0–3.0)
Eosinophils Absolute: 0.1 10*3/uL (ref 0.0–0.7)
Eosinophils Relative: 1.4 % (ref 0.0–5.0)
HCT: 29.2 % — ABNORMAL LOW (ref 36.0–46.0)
Hemoglobin: 9.4 g/dL — ABNORMAL LOW (ref 12.0–15.0)
Lymphocytes Relative: 16.4 % (ref 12.0–46.0)
Lymphs Abs: 0.8 10*3/uL (ref 0.7–4.0)
MCHC: 32.3 g/dL (ref 30.0–36.0)
MCV: 84.2 fl (ref 78.0–100.0)
Monocytes Absolute: 0.3 10*3/uL (ref 0.1–1.0)
Monocytes Relative: 6.3 % (ref 3.0–12.0)
Neutro Abs: 3.8 10*3/uL (ref 1.4–7.7)
Neutrophils Relative %: 75.2 % (ref 43.0–77.0)
Platelets: 243 10*3/uL (ref 150.0–400.0)
RBC: 3.46 Mil/uL — ABNORMAL LOW (ref 3.87–5.11)
RDW: 19 % — ABNORMAL HIGH (ref 11.5–15.5)
WBC: 5.1 10*3/uL (ref 4.0–10.5)

## 2018-07-27 LAB — FERRITIN: Ferritin: 267.4 ng/mL (ref 10.0–291.0)

## 2018-07-27 LAB — IBC PANEL
Iron: 66 ug/dL (ref 42–145)
Saturation Ratios: 19.9 % — ABNORMAL LOW (ref 20.0–50.0)
Transferrin: 237 mg/dL (ref 212.0–360.0)

## 2018-07-27 MED ORDER — SACCHAROMYCES BOULARDII 250 MG PO CAPS: 250.0000 mg | ORAL_CAPSULE | Freq: Every day | ORAL | 0 refills | Status: DC

## 2018-07-27 NOTE — Telephone Encounter (Signed)
Covid-19 screening questions   Do you now or have you had a fever in the last 14 days? No  Do you have any respiratory symptoms of shortness of breath or cough now or in the last 14 days? No  Do you have any family members or close contacts with diagnosed or suspected Covid-19 in the past 14 days? No  Have you been tested for Covid-19 and found to be positive? No        

## 2018-07-27 NOTE — Patient Instructions (Addendum)
We have given you hemoccult stool cards to complete and mail back to our lab  Take Florstor 1 capsule daily for 4 weeks  Go to the basement for labs   Continue Lactose free diet  Follow up in 2 months   I appreciate the  opportunity to care for you  Thank You   Harl Bowie , MD    Lactose-Free Diet, Adult If you have lactose intolerance, you are not able to digest lactose. Lactose is a natural sugar found mainly in dairy milk and dairy products. You may need to avoid all foods and beverages that contain lactose. A lactose-free diet can help you do this. Which foods have lactose? Lactose is found in dairy milk and dairy products, such as:  Yogurt.  Cheese.  Butter.  Margarine.  Sour cream.  Cream.  Whipped toppings and nondairy creamers.  Ice cream and other dairy-based desserts. Lactose is also found in foods or products made with dairy milk or milk ingredients. To find out whether a food contains dairy milk or a milk ingredient, look at the ingredients list. Avoid foods with the statement "May contain milk" and foods that contain:  Milk powder.  Whey.  Curd.  Caseinate.  Lactose.  Lactalbumin.  Lactoglobulin. What are alternatives to dairy milk and foods made with milk products?  Lactose-free milk.  Soy milk with added calcium and vitamin D.  Almond milk, coconut milk, rice milk, or other nondairy milk alternatives with added calcium and vitamin D. Note that these are low in protein.  Soy products, such as soy yogurt, soy cheese, soy ice cream, and soy-based sour cream.  Other nut milk products, such as almond yogurt, almond cheese, cashew yogurt, cashew cheese, cashew ice cream, coconut yogurt, and coconut ice cream. What are tips for following this plan?  Do not consume foods, beverages, vitamins, minerals, or medicines containing lactose. Read ingredient lists carefully.  Look for the words "lactose-free" on labels.  Use lactase enzyme  drops or tablets as directed by your health care provider.  Use lactose-free milk or a milk alternative, such as soy milk or almond milk, for drinking and cooking.  Make sure you get enough calcium and vitamin D in your diet. A lactose-free eating plan can be lacking in these important nutrients.  Take calcium and vitamin D supplements as directed by your health care provider. Talk to your health care provider about supplements if you are not able to get enough calcium and vitamin D from food. What foods can I eat?  Fruits All fresh, canned, frozen, or dried fruits that are not processed with lactose. Vegetables All fresh, frozen, and canned vegetables without cheese, cream, or butter sauces. Grains Any that are not made with dairy milk or dairy products. Meats and other proteins Any meat, fish, poultry, and other protein sources that are not made with dairy milk or dairy products. Soy cheese and yogurt. Fats and oils Any that are not made with dairy milk or dairy products. Beverages Lactose-free milk. Soy, rice, or almond milk with added calcium and vitamin D. Fruit and vegetable juices. Sweets and desserts Any that are not made with dairy milk or dairy products. Seasonings and condiments Any that are not made with dairy milk or dairy products. Calcium Calcium is found in many foods that contain lactose and is important for bone health. The amount of calcium you need depends on your age:  Adults younger than 50 years: 1,000 mg of calcium a day.  Adults  older than 50 years: 1,200 mg of calcium a day. If you are not getting enough calcium, you may get it from other sources, including:  Orange juice with calcium added. There are 300-350 mg of calcium in 1 cup of orange juice.  Calcium-fortified soy milk. There are 300-400 mg of calcium in 1 cup of calcium-fortified soy milk.  Calcium-fortified rice or almond milk. There are 300 mg of calcium in 1 cup of calcium-fortified rice or  almond milk.  Calcium-fortified breakfast cereals. There are 100-1,000 mg of calcium in calcium-fortified breakfast cereals.  Spinach, cooked. There are 145 mg of calcium in  cup of cooked spinach.  Edamame, cooked. There are 130 mg of calcium in  cup of cooked edamame.  Collard greens, cooked. There are 125 mg of calcium in  cup of cooked collard greens.  Kale, frozen or cooked. There are 90 mg of calcium in  cup of cooked or frozen kale.  Almonds. There are 95 mg of calcium in  cup of almonds.  Broccoli, cooked. There are 60 mg of calcium in 1 cup of cooked broccoli. The items listed above may not be a complete list of recommended foods and beverages. Contact a dietitian for more options. What foods are not recommended? Fruits None, unless they are made with dairy milk or dairy products. Vegetables None, unless they are made with dairy milk or dairy products. Grains Any grains that are made with dairy milk or dairy products. Meats and other proteins None, unless they are made with dairy milk or dairy products. Dairy All dairy products, including milk, goat's milk, buttermilk, kefir, acidophilus milk, flavored milk, evaporated milk, condensed milk, dulce de Erda, eggnog, yogurt, cheese, and cheese spreads. Fats and oils Any that are made with milk or milk products. Margarines and salad dressings that contain milk or cheese. Cream. Half and half. Cream cheese. Sour cream. Chip dips made with sour cream or yogurt. Beverages Hot chocolate. Cocoa with lactose. Instant iced teas. Powdered fruit drinks. Smoothies made with dairy milk or yogurt. Sweets and desserts Any that are made with milk or milk products. Seasonings and condiments Chewing gum that has lactose. Spice blends if they contain lactose. Artificial sweeteners that contain lactose. Nondairy creamers. The items listed above may not be a complete list of foods and beverages to avoid. Contact a dietitian for more  information. Summary  If you are lactose intolerant, it means that you have a hard time digesting lactose, a natural sugar found in milk and milk products.  Following a lactose-free diet can help you manage this condition.  Calcium is important for bone health and is found in many foods that contain lactose. Talk with your health care provider about other sources of calcium. This information is not intended to replace advice given to you by your health care provider. Make sure you discuss any questions you have with your health care provider. Document Released: 06/21/2001 Document Revised: 01/27/2017 Document Reviewed: 01/27/2017 Elsevier Patient Education  2020 [employer].

## 2018-07-27 NOTE — Progress Notes (Signed)
Jade Ellison    [id number]    [birth year]  Primary Care Physician:Gordon, Cherylann Ratel, PA-C  Referring Physician: Mindi Curling, PA-C [place],  [place] [id number]   Chief complaint:  Anemia  HPI:  73 year old female with morbid obesity, hypertension, hyperlipidemia, obstructive sleep apnea and type 2 diabetes.  Patient's been diagnosed with recent onset anemia.  Denies any melena or hematochezia. No abdominal pain, nausea, vomiting, decreased appetite or weight loss. Wheelchair-bound, is accompanied by her daughter.  She is getting more short of breath and tired even walking around the house. He continues to have irregular bowel habits with intermittent diarrhea and fecal urgency with seepage, it had improved when she was taking probiotic Florastor.  Denies any bright red blood per rectum.  EGD 08/01/2015: Normal EGD 04/19/2014 mild gastritis, ?  Barrett's at EG junction.  Biopsies negative for Barrett's, reflux changes. Small bowel enteroscopy 02/05/2012 no AVM, mild gastritis biopsied Capsule endoscopy 06/12/2011: Duodenal and ileal AVMs EGD and colonoscopy for iron deficiency anemia 10/14/2010: Mild gastritis, colonic diverticulosis and hemorrhoids Colonoscopy 10/27/2006: Colon polyp, diverticulosis, internal hemorrhoids  Outpatient Encounter Medications as of 07/27/2018  Medication Sig  . ALPRAZolam (XANAX) 0.25 MG tablet 0.25 mg. Take 1 tablet (0.25 mg total) by mouth at bedtime as needed for Sleep or Anxiety.  Marland Kitchen aspirin EC 81 MG tablet Take 162 mg by mouth daily.  Marland Kitchen atorvastatin (LIPITOR) 40 MG tablet Take 40 mg by mouth daily.   Marland Kitchen desonide (DESOWEN) 0.05 % cream Apply topically 2 (two) times daily.  . diclofenac sodium (VOLTAREN) 1 % GEL Apply 2 g topically 4 (four) times daily.  Marland Kitchen dicyclomine (BENTYL) 20 MG tablet TAKE 1 TABLET(20 MG) BY MOUTH THREE TIMES DAILY AFTER MEALS  . DULoxetine (CYMBALTA) 60 MG capsule Take 60 mg by mouth daily.   Marland Kitchen glipiZIDE (GLUCOTROL) 10 MG tablet Take 10 mg by mouth daily before breakfast.  . HYDROcodone-acetaminophen (NORCO) 7.5-325 MG per tablet TK 1 T PO  Q 8 H AS NEEDED FOR PAIN  . latanoprost (XALATAN) 0.005 % ophthalmic solution Place 1 drop into both eyes daily.   Marland Kitchen losartan-hydrochlorothiazide (HYZAAR) 100-12.5 MG tablet Take 1 tablet by mouth daily.  . metFORMIN (GLUCOPHAGE) 1000 MG tablet Take 1,000 mg by mouth 2 (two) times daily with a meal.   . Mouthwashes (BIOTENE/CALCIUM MT) Take 1 tablet by mouth daily. Use as directed 1 tablet in the mouth or throat daily.  . OMEGA 3 1000 MG CAPS Take 1 capsule by mouth daily.   Marland Kitchen omeprazole (PRILOSEC) 40 MG capsule TAKE 1 CAPSULE(40 MG) BY MOUTH DAILY  . pregabalin (LYRICA) 25 MG capsule Take 25 mg by mouth 2 (two) times daily.  . psyllium (METAMUCIL SMOOTH TEXTURE) 28 % packet Take 1 packet by mouth daily. Take 1 packet by mouth 2 (two) times daily.  . vitamin B-12 (CYANOCOBALAMIN) 50 MCG tablet Take 50 mcg by mouth daily.  . calcium-vitamin D (OSCAL WITH D) 500-200 MG-UNIT per tablet Take 1 tablet by mouth daily. Take 1 tablet by mouth daily.  . metoprolol tartrate (LOPRESSOR) 25 MG tablet Take 50 mg by mouth 2 (two) times daily.    No facility-administered encounter medications on file as of 07/27/2018.     Allergies as of 07/27/2018 - Review Complete 07/27/2018  Allergen Reaction Noted  . Fosamax [alendronate sodium] Anaphylaxis 07/21/2014  . Codeine Rash 10/02/2010  . Nsaids  02/04/2011  Past Medical History:  Diagnosis Date  . Adult stuttering   . Anemia   . Anxiety    Panic attack- - 1 year ago (cries - doneest know why and gets nervous  . Arthritis   . 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 CAD (coronary artery disease)   . Colon polyp    polypoid colorectal mucosa  . Complication of anesthesia   . Depression   . Diverticulosis   . DJD (degenerative joint disease)    left knee  . Esophagitis   . Gastritis   . Glaucoma   .  Helicobacter pylori (H. pylori)   . Hemorrhoids   . Hernia of unspecified site of abdominal cavity without mention of obstruction or gangrene   . History of blood transfusion   . History of kidney stones   . Hypertension 09 07 2013   TRANSTHORACIC ECHO STUDY CONCLUSIONS   . Hypertension 09 07 2013   EJECTION FRACTION- 55%-60% .WALL MOTION WAS NORMAL  . Impaired mobility   . Iron deficiency anemia   . Morbid obesity ([place])   . Neuropathy   . Obesity   . Olecranon bursitis of left elbow   . OSA (obstructive sleep apnea)    On cpap  . Osteoarthritis    right hip  . Osteoporosis   . Primary osteoarthritis    Bilaterally (knee)  . PSVT (paroxysmal supraventricular tachycardia) ([place])   . PSVT (paroxysmal supraventricular tachycardia) ([place])   . Short-term memory loss   . Shortness of breath dyspnea   . Sleep apnea    on CPAP- does not use machine  . Spinal headache 1986  . Type 2 diabetes mellitus ([place])     Past Surgical History:  Procedure Laterality Date  . CARDIAC CATHETERIZATION  2003    normal coronary arteries and nomal LV function by cath performed by Dr.Peter Nishan  . CESAREAN SECTION  1986  . CHOLECYSTECTOMY    . ENTEROSCOPY  02/05/2012   Procedure: ENTEROSCOPY;  Surgeon: Lafayette Dragon, MD;  Location: [facility];  Service: Endoscopy;  Laterality: N/A;  . ESOPHAGOGASTRODUODENOSCOPY N/A 04/19/2014   Procedure: ESOPHAGOGASTRODUODENOSCOPY (EGD);  Surgeon: Lafayette Dragon, MD;  Location: [facility];  Service: Endoscopy;  Laterality: N/A;  . ESOPHAGOGASTRODUODENOSCOPY (EGD) WITH PROPOFOL N/A 08/01/2015   Procedure: ESOPHAGOGASTRODUODENOSCOPY (EGD) WITH PROPOFOL;  Surgeon: Mauri Pole, MD;  Location: [facility];  Service: Endoscopy;  Laterality: N/A;  . HERNIA REPAIR    . HOT HEMOSTASIS  02/05/2012   Procedure: HOT HEMOSTASIS (ARGON PLASMA COAGULATION/BICAP);  Surgeon: Lafayette Dragon, MD;  Location: [facility];  Service: Endoscopy;  Laterality: N/A;  . KNEE  ARTHROSCOPY Left   . left sided lithotripsy    . TUBAL LIGATION      Family History  Problem Relation Age of Onset  . Heart disease Mother   . Heart disease Father   . Diabetes Brother   . Diabetes Sister   . Kidney cancer Brother   . Colon cancer Neg Hx     Social History   Socioeconomic History  . Marital status: Widowed    Spouse name: Not on file  . Number of children: 1  . Years of education: Not on file  . Highest education level: Some college, no degree  Occupational History  . Occupation: RETIRED    Employer: RETIRED  Social Needs  . Financial resource strain: Not on file  . Food insecurity    Worry: Not on file    Inability: Not on  file  . Transportation needs    Medical: Not on file    Non-medical: Not on file  Tobacco Use  . Smoking status: Never Smoker  . Smokeless tobacco: Never Used  Substance and Sexual Activity  . Alcohol use: Yes    Alcohol/week: 1.0 standard drinks    Types: 1 Glasses of wine per week    Comment: wine sometimes on holidays and bedtime- rarely  . Drug use: No  . Sexual activity: Not on file  Lifestyle  . Physical activity    Days per week: Not on file    Minutes per session: Not on file  . Stress: Not on file  Relationships  . Social Herbalist on phone: Not on file    Gets together: Not on file    Attends religious service: Not on file    Active member of club or organization: Not on file    Attends meetings of clubs or organizations: Not on file    Relationship status: Not on file  . Intimate partner violence    Fear of current or ex partner: Not on file    Emotionally abused: Not on file    Physically abused: Not on file    Forced sexual activity: Not on file  Other Topics Concern  . Not on file  Social History Narrative   04/29/18 Lives alone, dgtr visits, stays often   Caffeine- coffee once a week, sodas rarely      Review of systems: Review of Systems  Constitutional: Negative for fever and  chills.  HENT: Negative.   Eyes: Negative for blurred vision.  Respiratory: Positive for cough, shortness of breath and wheezing.   Cardiovascular: Negative for chest pain and palpitations.  Gastrointestinal: as per HPI Genitourinary: Negative for dysuria, urgency, frequency and hematuria.  Musculoskeletal: Positive for myalgias, back pain and joint pain.  Skin: Negative for itching and rash.  Neurological: Negative for dizziness, tremors, focal weakness, seizures and loss of consciousness.  Endo/Heme/Allergies: Positive for seasonal allergies.  Psychiatric/Behavioral: Negative for depression, suicidal ideas and hallucinations.  All other systems reviewed and are negative.   Physical Exam: Vitals:   07/27/18 1045  BP: 130/70  Pulse: 75  Temp: (!) 97.4 F (36.3 C)   Body mass index is 51.02 kg/m. Gen:      No acute distress HEENT:  EOMI, sclera anicteric Neck:     No masses; no thyromegaly Lungs:    Clear to auscultation bilaterally; normal respiratory effort CV:         Regular rate and rhythm; no murmurs Abd:      + bowel sounds; soft, non-tender; no palpable masses, no distension Ext:    No edema; adequate peripheral perfusion Skin:      Warm and dry; no rash Neuro: alert and oriented x 3 Psych: normal mood and affect  Data Reviewed:  Reviewed labs, radiology imaging, old records and pertinent past GI work up   Assessment and Plan/Recommendations: 73 year old female with morbid obesity, type 2 diabetes, obstructive sleep apnea, hypertension, hyperlipidemia, chronic GERD and irritable bowel syndrome predominant diarrhea  Anemia with drop in hemoglobin  Will recheck CBC and iron panel  Fecal Hemoccult to evaluate for occult GI blood loss, patient denies any overt GI bleeding  If fecal Hemoccult positive with iron deficiency, consider capsule endoscopy to further evaluate  She is up-to-date for colorectal cancer screening, due for recall colonoscopy October 2022   IBS D: Lactose-free diet Start probiotic Florastor for  4 weeks  If continues to have fecal urgency and seepage with incontinence, will plan for anorectal manometry and referral to pelvic floor physical therapy  Follow up in 2 months   25 minutes was spent face-to-face with the patient. Greater than 50% of the time used for counseling as well as treatment plan and follow-up. She had multiple questions which were answered to her satisfaction  K. Denzil Magnuson , MD    CC: Mindi Curling, PA-C

## 2018-08-03 ENCOUNTER — Telehealth: Payer: Self-pay | Admitting: Gastroenterology

## 2018-08-03 NOTE — Telephone Encounter (Signed)
Pt inquired about lab results.  °

## 2018-08-03 NOTE — Telephone Encounter (Signed)
Advised 

## 2018-08-09 ENCOUNTER — Other Ambulatory Visit (INDEPENDENT_AMBULATORY_CARE_PROVIDER_SITE_OTHER): Payer: Medicare Other

## 2018-08-09 DIAGNOSIS — D508 Other iron deficiency anemias: Secondary | ICD-10-CM | POA: Diagnosis not present

## 2018-08-09 DIAGNOSIS — R152 Fecal urgency: Secondary | ICD-10-CM

## 2018-08-09 DIAGNOSIS — R159 Full incontinence of feces: Secondary | ICD-10-CM | POA: Diagnosis not present

## 2018-08-09 DIAGNOSIS — K580 Irritable bowel syndrome with diarrhea: Secondary | ICD-10-CM

## 2018-08-09 LAB — HEMOCCULT SLIDES (X 3 CARDS)
Fecal Occult Blood: NEGATIVE
OCCULT 1: NEGATIVE
OCCULT 2: NEGATIVE
OCCULT 3: NEGATIVE
OCCULT 4: NEGATIVE
OCCULT 5: NEGATIVE

## 2018-08-16 ENCOUNTER — Telehealth: Payer: Self-pay | Admitting: Gastroenterology

## 2018-08-17 NOTE — Telephone Encounter (Signed)
Patient has turned in her hemoccult cards. Not resulted yet. She asks about diet for her low iron. Discussed iron rich foods.  Explained feeling cold is a common complaint of anemia.  She denies frank blood in her stools. Stools are dark. She takes oral iron.

## 2018-09-15 ENCOUNTER — Telehealth: Payer: Self-pay | Admitting: Gastroenterology

## 2018-09-15 NOTE — Telephone Encounter (Signed)
Called [facility] for the results. Waiting for the return call.

## 2018-09-15 NOTE — Telephone Encounter (Signed)
IT issues. The results of the hemoccult cards will not cross over to the patient's records. Report will be faxed.  Spoke with the patient. She is aware the results are negative for blood x3.  What is the next step. Thanks

## 2018-09-16 NOTE — Telephone Encounter (Signed)
Given Hemoccult stool negative, will hold off small bowel video capsule to evaluate for possible occult GI blood loss. Please advise patient to follow-up with PMD for work-up of anemia and follow-up CBC.  Thank you

## 2018-09-16 NOTE — Telephone Encounter (Signed)
Left message for the patient

## 2018-09-17 NOTE — Telephone Encounter (Signed)
Copy of hemoccult cards and labs forwarded to PCP

## 2018-10-06 DIAGNOSIS — R413 Other amnesia: Secondary | ICD-10-CM | POA: Insufficient documentation

## 2019-01-16 DIAGNOSIS — N390 Urinary tract infection, site not specified: Secondary | ICD-10-CM | POA: Insufficient documentation

## 2019-02-21 ENCOUNTER — Ambulatory Visit: Payer: Medicare Other | Admitting: Gastroenterology

## 2019-02-21 ENCOUNTER — Ambulatory Visit: Payer: 59

## 2019-03-11 ENCOUNTER — Ambulatory Visit: Payer: Medicare Other

## 2019-03-28 ENCOUNTER — Telehealth: Payer: Self-pay | Admitting: Gastroenterology

## 2019-03-29 NOTE — Telephone Encounter (Signed)
Spoke with patient.  She states she will come in.

## 2019-03-29 NOTE — Telephone Encounter (Signed)
Prefer to see her in office, has worsening iron deficiency anemia.  Please check if transportation issue has resolved, if not okay to switch to virtual visit.  Thank you

## 2019-03-30 ENCOUNTER — Ambulatory Visit (INDEPENDENT_AMBULATORY_CARE_PROVIDER_SITE_OTHER): Payer: Medicare Other | Admitting: Gastroenterology

## 2019-03-30 ENCOUNTER — Other Ambulatory Visit: Payer: Self-pay

## 2019-03-30 ENCOUNTER — Encounter: Payer: Self-pay | Admitting: Gastroenterology

## 2019-03-30 VITALS — BP 104/72 | HR 65 | Temp 98.5°F | Ht 63.0 in | Wt 288.0 lb

## 2019-03-30 DIAGNOSIS — D509 Iron deficiency anemia, unspecified: Secondary | ICD-10-CM | POA: Diagnosis not present

## 2019-03-30 MED ORDER — NA SULFATE-K SULFATE-MG SULF 17.5-3.13-1.6 GM/177ML PO SOLN: ORAL | 0 refills | Status: DC

## 2019-03-30 NOTE — Progress Notes (Signed)
Jade Ellison    [id number]    1945-11-27  Primary Care Physician:Gordon, Cherylann Ratel, PA-C  Referring Physician: Mindi Curling, PA-C [place],   [id number]   Chief complaint: anemia  HPI: 74 year old female with morbid obesity, hypertension, hyperlipidemia, obstructive sleep apnea and type 2 diabetes here for follow-up visit for worsening anemia  Normocytic anemia with normal ferritin 267, slightly borderline low iron saturation ratio of 19.9.  Hemoglobin 9.4, hematocrit 29.2  Fecal Hemoccult negative August 09, 2018  Denies any nausea, vomiting, abdominal pain, melena or bright red blood per rectum   EGD 08/01/2015: Normal EGD 04/19/2014 mild gastritis, ?  Barrett's at EG junction.  Biopsies negative for Barrett's, reflux changes. Small bowel enteroscopy 02/05/2012 no AVM, mild gastritis biopsied Capsule endoscopy 06/12/2011: Duodenal and ileal AVMs EGD and colonoscopy for iron deficiency anemia 10/14/2010: Mild gastritis, colonic diverticulosis and hemorrhoids Colonoscopy 10/27/2006: Colon polyp, diverticulosis, internal hemorrhoids  Outpatient Encounter Medications as of 03/30/2019  Medication Sig  . ALPRAZolam (XANAX) 0.25 MG tablet 0.25 mg. Take 1 tablet (0.25 mg total) by mouth at bedtime as needed for Sleep or Anxiety.  Marland Kitchen aspirin EC 81 MG tablet Take 162 mg by mouth daily.  Marland Kitchen atorvastatin (LIPITOR) 40 MG tablet Take 40 mg by mouth daily.   Marland Kitchen desonide (DESOWEN) 0.05 % cream Apply topically 2 (two) times daily.  . diclofenac sodium (VOLTAREN) 1 % GEL Apply 2 g topically 4 (four) times daily.  Marland Kitchen dicyclomine (BENTYL) 20 MG tablet TAKE 1 TABLET(20 MG) BY MOUTH THREE TIMES DAILY AFTER MEALS  . DULoxetine (CYMBALTA) 60 MG capsule Take 60 mg by mouth daily.  . Exenatide ER 2 MG PEN Inject into the skin.  Marland Kitchen glipiZIDE (GLUCOTROL) 10 MG tablet Take 10 mg by mouth daily before breakfast.  . HYDROcodone-acetaminophen (NORCO) 7.5-325 MG  per tablet TK 1 T PO  Q 8 H AS NEEDED FOR PAIN  . latanoprost (XALATAN) 0.005 % ophthalmic solution Place 1 drop into both eyes daily.   Marland Kitchen losartan-hydrochlorothiazide (HYZAAR) 100-12.5 MG tablet Take 1 tablet by mouth daily.  . metFORMIN (GLUCOPHAGE) 1000 MG tablet Take 1,000 mg by mouth 2 (two) times daily with a meal.   . Mouthwashes (BIOTENE/CALCIUM MT) Take 1 tablet by mouth daily. Use as directed 1 tablet in the mouth or throat daily.  . OMEGA 3 1000 MG CAPS Take 1 capsule by mouth daily.   Marland Kitchen omeprazole (PRILOSEC) 40 MG capsule TAKE 1 CAPSULE(40 MG) BY MOUTH DAILY  . pregabalin (LYRICA) 25 MG capsule Take 25 mg by mouth 2 (two) times daily.  . psyllium (METAMUCIL SMOOTH TEXTURE) 28 % packet Take 1 packet by mouth daily. Take 1 packet by mouth 2 (two) times daily.  Marland Kitchen saccharomyces boulardii (FLORASTOR) 250 MG capsule Take 1 capsule (250 mg total) by mouth daily.  . vitamin B-12 (CYANOCOBALAMIN) 50 MCG tablet Take 50 mcg by mouth daily.  . calcium-vitamin D (OSCAL WITH D) 500-200 MG-UNIT per tablet Take 1 tablet by mouth daily. Take 1 tablet by mouth daily.  . metoprolol tartrate (LOPRESSOR) 25 MG tablet Take 50 mg by mouth 2 (two) times daily.    No facility-administered encounter medications on file as of 03/30/2019.    Allergies as of 03/30/2019 - Review Complete 03/30/2019  Allergen Reaction Noted  . Fosamax [alendronate sodium] Anaphylaxis 07/21/2014  . Codeine Rash 10/02/2010  .  Nsaids  02/04/2011    Past Medical History:  Diagnosis Date  . Adult stuttering   . Anemia   . Anxiety    Panic attack- - 1 year ago (cries - doneest know why and gets nervous  . Arthritis   . 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 CAD (coronary artery disease)   . Colon polyp    polypoid colorectal mucosa  . Complication of anesthesia   . Depression   . Diverticulosis   . DJD (degenerative joint disease)    left knee  . Esophagitis   . Gastritis   . Glaucoma   . Helicobacter pylori (H. pylori)   .  Hemorrhoids   . Hernia of unspecified site of abdominal cavity without mention of obstruction or gangrene   . History of blood transfusion   . History of kidney stones   . Hypertension 09 07 2013   TRANSTHORACIC ECHO STUDY CONCLUSIONS   . Hypertension 09 07 2013   EJECTION FRACTION- 55%-60% .WALL MOTION WAS NORMAL  . Impaired mobility   . Iron deficiency anemia   . Morbid obesity ([place])   . Neuropathy   . Obesity   . Olecranon bursitis of left elbow   . OSA (obstructive sleep apnea)    On cpap  . Osteoarthritis    right hip  . Osteoporosis   . Primary osteoarthritis    Bilaterally (knee)  . PSVT (paroxysmal supraventricular tachycardia) ([place])   . PSVT (paroxysmal supraventricular tachycardia) ([place])   . Short-term memory loss   . Shortness of breath dyspnea   . Sleep apnea    on CPAP- does not use machine  . Spinal headache 1986  . Type 2 diabetes mellitus ([place])     Past Surgical History:  Procedure Laterality Date  . CARDIAC CATHETERIZATION  2003    normal coronary arteries and nomal LV function by cath performed by Dr.Peter Nishan  . CESAREAN SECTION  1986  . CHOLECYSTECTOMY    . ENTEROSCOPY  02/05/2012   Procedure: ENTEROSCOPY;  Surgeon: Lafayette Dragon, MD;  Location: [facility];  Service: Endoscopy;  Laterality: N/A;  . ESOPHAGOGASTRODUODENOSCOPY N/A 04/19/2014   Procedure: ESOPHAGOGASTRODUODENOSCOPY (EGD);  Surgeon: Lafayette Dragon, MD;  Location: [facility];  Service: Endoscopy;  Laterality: N/A;  . ESOPHAGOGASTRODUODENOSCOPY (EGD) WITH PROPOFOL N/A 08/01/2015   Procedure: ESOPHAGOGASTRODUODENOSCOPY (EGD) WITH PROPOFOL;  Surgeon: Mauri Pole, MD;  Location: [facility];  Service: Endoscopy;  Laterality: N/A;  . HERNIA REPAIR    . HOT HEMOSTASIS  02/05/2012   Procedure: HOT HEMOSTASIS (ARGON PLASMA COAGULATION/BICAP);  Surgeon: Lafayette Dragon, MD;  Location: [facility];  Service: Endoscopy;  Laterality: N/A;  . KNEE ARTHROSCOPY Left   . left sided lithotripsy      . TUBAL LIGATION      Family History  Problem Relation Age of Onset  . Heart disease Mother   . Heart disease Father   . Diabetes Brother   . Diabetes Sister   . Kidney cancer Brother   . Colon cancer Neg Hx     Social History   Socioeconomic History  . Marital status: Widowed    Spouse name: Not on file  . Number of children: 1  . Years of education: Not on file  . Highest education level: Some college, no degree  Occupational History  . Occupation: RETIRED    Employer: RETIRED  Tobacco Use  . Smoking status: Never Smoker  . Smokeless tobacco: Never Used  Substance and Sexual Activity  .  Alcohol use: Yes    Alcohol/week: 1.0 standard drinks    Types: 1 Glasses of wine per week    Comment: wine sometimes on holidays and bedtime- rarely  . Drug use: No  . Sexual activity: Not on file  Other Topics Concern  . Not on file  Social History Narrative   04/29/18 Lives alone, dgtr visits, stays often   Caffeine- coffee once a week, sodas rarely   Social Determinants of Health   Financial Resource Strain:   . Difficulty of Paying Living Expenses:   Food Insecurity:   . Worried About Charity fundraiser in the Last Year:   . Arboriculturist in the Last Year:   Transportation Needs:   . Film/video editor (Medical):   Marland Kitchen Lack of Transportation (Non-Medical):   Physical Activity:   . Days of Exercise per Week:   . Minutes of Exercise per Session:   Stress:   . Feeling of Stress :   Social Connections:   . Frequency of Communication with Friends and Family:   . Frequency of Social Gatherings with Friends and Family:   . Attends Religious Services:   . Active Member of Clubs or Organizations:   . Attends Archivist Meetings:   Marland Kitchen Marital Status:   Intimate Partner Violence:   . Fear of Current or Ex-Partner:   . Emotionally Abused:   Marland Kitchen Physically Abused:   . Sexually Abused:       Review of systems:  All other review of systems negative except  as mentioned in the HPI.   Physical Exam: Vitals:   03/30/19 1353  Temp: 98.5 F (36.9 C)   Body mass index is 51.02 kg/m. Gen:      No acute distress, in wheelchair Abd:      + bowel sounds; soft, non-tender; no palpable masses, no distension Neuro: alert and oriented x 3 Psych: normal mood and affect  Data Reviewed:  Reviewed labs, radiology imaging, old records and pertinent past GI work up   Assessment and Plan/Recommendations:  74 year old female with morbid obesity, chronic IBS, GERD and iron deficiency anemia with worsening hemoglobin and hematocrit  Anemia likely multifactorial but will need to exclude ongoing GI blood loss We will schedule for EGD and colonoscopy for further evaluation, to be scheduled at [facility] endoscopy unit given BMI over 50 The risks and benefits as well as alternatives of endoscopic procedure(s) have been discussed and reviewed. All questions answered. The patient agrees to proceed.   The patient was provided an opportunity to ask questions and all were answered. The patient agreed with the plan and demonstrated an understanding of the instructions.  Damaris Hippo , MD    CC: Mindi Curling, PA-C

## 2019-03-30 NOTE — Patient Instructions (Addendum)
If you are age 74 or older, your body mass index should be between 23-30. Your Body mass index is 51.02 kg/m. If this is out of the aforementioned range listed, please consider follow up with your Primary Care Provider.  If you are age 59 or younger, your body mass index should be between 19-25. Your Body mass index is 51.02 kg/m. If this is out of the aformentioned range listed, please consider follow up with your Primary Care Provider.   You have been scheduled for an endoscopy and colonoscopy. Please follow the written instructions given to you at your visit today. Please pick up your prep supplies at the pharmacy within the next 1-3 days. If you use inhalers (even only as needed), please bring them with you on the day of your procedure. Your physician has requested that you go to www.startemmi.com and enter the access code given to you at your visit today. This web site gives a general overview about your procedure. However, you should still follow specific instructions given to you by our office regarding your preparation for the procedure.  We have sent the following medications to your pharmacy for you to pick up at your convenience: [facility] provider has requested that you go to the basement level for lab work before leaving today. Press "B" on the elevator. The lab is located at the first door on the left as you exit the elevator.  Due to recent COVID-19 restrictions implemented by our local and state authorities and in an effort to keep both patients and staff as safe as possible, our hospital system now requires COVID-19 testing prior to any scheduled hospital procedure. Please go to our [facility] location drive thru testing site ([address], [place], [place] [address]) on 05/14/19 at  9:05 am. There will be multiple testing areas, the first checkpoint being for pre-procedure/surgery testing. Get into the right (yellow) lane that leads to the PAT testing team. You will not  be billed at the time of testing but may receive a bill later depending on your insurance. The approximate cost of the test is $100. You must agree to quarantine from the time of your testing until the procedure date on 05/17/19 . This should include staying at home with ONLY the people you live with. Avoid take-out, grocery store shopping or leaving the house for any non-emergent reason. Failure to have your COVID-19 test done on the date and time you have been scheduled will result in cancellation of procedure. Please call our office at [phone] if you have any questions.

## 2019-04-01 ENCOUNTER — Ambulatory Visit: Payer: Medicare Other | Admitting: Family Medicine

## 2019-04-04 ENCOUNTER — Telehealth: Payer: Self-pay | Admitting: Gastroenterology

## 2019-04-04 NOTE — Telephone Encounter (Signed)
Called and spoke with pt this afternoon. Prep has been changed to Plenvu. Pt will come by office and pick up new instructions and sample of Plenuv. Instructions and prep placed at front desk.

## 2019-04-05 ENCOUNTER — Encounter: Payer: Self-pay | Admitting: Gastroenterology

## 2019-04-07 DIAGNOSIS — Z9851 Tubal ligation status: Secondary | ICD-10-CM | POA: Insufficient documentation

## 2019-04-07 DIAGNOSIS — Z8719 Personal history of other diseases of the digestive system: Secondary | ICD-10-CM | POA: Insufficient documentation

## 2019-04-07 DIAGNOSIS — Z9049 Acquired absence of other specified parts of digestive tract: Secondary | ICD-10-CM | POA: Insufficient documentation

## 2019-05-12 ENCOUNTER — Telehealth: Payer: Self-pay | Admitting: Gastroenterology

## 2019-05-12 NOTE — Telephone Encounter (Signed)
Spoke with the daughter. The patient will miss the COVID screening which will make her ineligible for the colonoscopy at [facility] long Endo. She states the patient is feeling better about things because "her numbers went up."  She is asking if it is okay to put off the colonoscopy for now, in your opinion. Thanks

## 2019-05-12 NOTE — Telephone Encounter (Signed)
The recommendation is to proceed with procedures as discussed during office visit  but it is up to the patient if she wants to hold it off for now. Thanks

## 2019-05-12 NOTE — Telephone Encounter (Signed)
Advised. Holding off for now. I will contact her with the next hospital date available.

## 2019-05-12 NOTE — Telephone Encounter (Signed)
Patient called states she needs to reschedule her hospital procedure please call her with dates available first per her request

## 2019-05-13 ENCOUNTER — Other Ambulatory Visit (HOSPITAL_COMMUNITY): Payer: Medicare Other

## 2019-05-14 ENCOUNTER — Other Ambulatory Visit (HOSPITAL_COMMUNITY): Payer: Medicare Other

## 2019-05-17 ENCOUNTER — Encounter (HOSPITAL_COMMUNITY): Admission: RE | Payer: Self-pay | Source: Home / Self Care

## 2019-05-17 ENCOUNTER — Ambulatory Visit (HOSPITAL_COMMUNITY): Admission: RE | Admit: 2019-05-17 | Payer: Medicare Other | Source: Home / Self Care | Admitting: Gastroenterology

## 2019-05-17 SURGERY — COLONOSCOPY WITH PROPOFOL
Anesthesia: Monitor Anesthesia Care

## 2019-06-27 ENCOUNTER — Telehealth: Payer: Self-pay | Admitting: Gastroenterology

## 2019-06-29 ENCOUNTER — Other Ambulatory Visit: Payer: Self-pay

## 2019-06-29 NOTE — Telephone Encounter (Signed)
Offered the next hospital procedure date. [place] during that time. New schedule is not available. She reports she feels well and is without complaints.

## 2019-08-12 ENCOUNTER — Other Ambulatory Visit (HOSPITAL_COMMUNITY): Payer: Medicare Other

## 2019-08-16 ENCOUNTER — Ambulatory Visit (HOSPITAL_COMMUNITY): Admit: 2019-08-16 | Payer: Medicare Other | Admitting: Gastroenterology

## 2019-08-16 ENCOUNTER — Encounter (HOSPITAL_COMMUNITY): Payer: Self-pay

## 2019-08-16 SURGERY — ESOPHAGOGASTRODUODENOSCOPY (EGD) WITH PROPOFOL
Anesthesia: Monitor Anesthesia Care

## 2019-09-02 NOTE — Telephone Encounter (Signed)
Pt called looking to r/s procedure at the hospital.

## 2019-09-05 ENCOUNTER — Other Ambulatory Visit: Payer: Self-pay

## 2019-09-05 DIAGNOSIS — D126 Benign neoplasm of colon, unspecified: Secondary | ICD-10-CM

## 2019-09-05 DIAGNOSIS — K219 Gastro-esophageal reflux disease without esophagitis: Secondary | ICD-10-CM

## 2019-09-05 DIAGNOSIS — D509 Iron deficiency anemia, unspecified: Secondary | ICD-10-CM

## 2019-09-05 NOTE — Telephone Encounter (Signed)
Spoke with the patient and the daughter. Covid screening 11/03/19 EGD/colon at [facility] 11/07/19 at 11:00 - arrive at 9:30 am Offered nurse visit to review instructions. Declined offer. States she is familiar with and comfortable with her written instructions. New instructions mailed today.

## 2019-10-28 NOTE — Progress Notes (Signed)
Attempted to obtain medical history via telephone, unable to reach at this time.  

## 2019-11-03 ENCOUNTER — Other Ambulatory Visit (HOSPITAL_COMMUNITY)
Admission: RE | Admit: 2019-11-03 | Discharge: 2019-11-03 | Disposition: A | Discharge status: Home / Self Care | Payer: Medicare Other | Source: Ambulatory Visit | Attending: Gastroenterology | Admitting: Gastroenterology

## 2019-11-03 DIAGNOSIS — Z01818 Encounter for other preprocedural examination: Secondary | ICD-10-CM | POA: Insufficient documentation

## 2019-11-03 DIAGNOSIS — Z20822 Contact with and (suspected) exposure to covid-19: Secondary | ICD-10-CM | POA: Diagnosis not present

## 2019-11-03 LAB — SARS CORONAVIRUS 2 (TAT 6-24 HRS): SARS Coronavirus 2: NEGATIVE

## 2019-11-06 ENCOUNTER — Telehealth: Payer: Self-pay | Admitting: Nurse Practitioner

## 2019-11-06 NOTE — Telephone Encounter (Signed)
Patient's daughter called to verify colonoscopy prep instructions. Mother has colonoscopy with Dr. Silverio Decamp at [facility] tomorrow, arrival  time 9:30am (procedure time 11am). Clear liquid diet today. 1st dose of bowel prep tonight at 6pm, 2nd dose at  4am 10/23.

## 2019-11-07 ENCOUNTER — Ambulatory Visit (HOSPITAL_COMMUNITY): Payer: Medicare Other | Admitting: Registered Nurse

## 2019-11-07 ENCOUNTER — Encounter (HOSPITAL_COMMUNITY)
Admission: RE | Disposition: A | Discharge status: Home / Self Care | Payer: Self-pay | Source: Home / Self Care | Attending: Gastroenterology

## 2019-11-07 ENCOUNTER — Ambulatory Visit (HOSPITAL_COMMUNITY)
Admission: RE | Admit: 2019-11-07 | Discharge: 2019-11-07 | Disposition: A | Discharge status: Home / Self Care | Payer: Medicare Other | Attending: Gastroenterology | Admitting: Gastroenterology

## 2019-11-07 ENCOUNTER — Encounter (HOSPITAL_COMMUNITY): Payer: Self-pay | Admitting: Gastroenterology

## 2019-11-07 ENCOUNTER — Other Ambulatory Visit: Payer: Self-pay

## 2019-11-07 DIAGNOSIS — K449 Diaphragmatic hernia without obstruction or gangrene: Secondary | ICD-10-CM | POA: Diagnosis not present

## 2019-11-07 DIAGNOSIS — F32A Depression, unspecified: Secondary | ICD-10-CM | POA: Diagnosis not present

## 2019-11-07 DIAGNOSIS — Z833 Family history of diabetes mellitus: Secondary | ICD-10-CM | POA: Diagnosis not present

## 2019-11-07 DIAGNOSIS — I2510 Atherosclerotic heart disease of native coronary artery without angina pectoris: Secondary | ICD-10-CM | POA: Diagnosis not present

## 2019-11-07 DIAGNOSIS — Z8249 Family history of ischemic heart disease and other diseases of the circulatory system: Secondary | ICD-10-CM | POA: Diagnosis not present

## 2019-11-07 DIAGNOSIS — Z79899 Other long term (current) drug therapy: Secondary | ICD-10-CM | POA: Diagnosis not present

## 2019-11-07 DIAGNOSIS — Z8719 Personal history of other diseases of the digestive system: Secondary | ICD-10-CM | POA: Insufficient documentation

## 2019-11-07 DIAGNOSIS — K635 Polyp of colon: Secondary | ICD-10-CM | POA: Diagnosis not present

## 2019-11-07 DIAGNOSIS — I10 Essential (primary) hypertension: Secondary | ICD-10-CM | POA: Insufficient documentation

## 2019-11-07 DIAGNOSIS — Z7982 Long term (current) use of aspirin: Secondary | ICD-10-CM | POA: Insufficient documentation

## 2019-11-07 DIAGNOSIS — M1712 Unilateral primary osteoarthritis, left knee: Secondary | ICD-10-CM | POA: Insufficient documentation

## 2019-11-07 DIAGNOSIS — G4733 Obstructive sleep apnea (adult) (pediatric): Secondary | ICD-10-CM | POA: Diagnosis not present

## 2019-11-07 DIAGNOSIS — H409 Unspecified glaucoma: Secondary | ICD-10-CM | POA: Diagnosis not present

## 2019-11-07 DIAGNOSIS — K5730 Diverticulosis of large intestine without perforation or abscess without bleeding: Secondary | ICD-10-CM | POA: Insufficient documentation

## 2019-11-07 DIAGNOSIS — Z7984 Long term (current) use of oral hypoglycemic drugs: Secondary | ICD-10-CM | POA: Insufficient documentation

## 2019-11-07 DIAGNOSIS — K644 Residual hemorrhoidal skin tags: Secondary | ICD-10-CM | POA: Diagnosis not present

## 2019-11-07 DIAGNOSIS — E1140 Type 2 diabetes mellitus with diabetic neuropathy, unspecified: Secondary | ICD-10-CM | POA: Diagnosis not present

## 2019-11-07 DIAGNOSIS — K648 Other hemorrhoids: Secondary | ICD-10-CM | POA: Insufficient documentation

## 2019-11-07 DIAGNOSIS — Z6841 Body Mass Index (BMI) 40.0 and over, adult: Secondary | ICD-10-CM | POA: Diagnosis not present

## 2019-11-07 DIAGNOSIS — D122 Benign neoplasm of ascending colon: Secondary | ICD-10-CM | POA: Insufficient documentation

## 2019-11-07 DIAGNOSIS — F410 Panic disorder [episodic paroxysmal anxiety] without agoraphobia: Secondary | ICD-10-CM | POA: Insufficient documentation

## 2019-11-07 DIAGNOSIS — D509 Iron deficiency anemia, unspecified: Secondary | ICD-10-CM | POA: Diagnosis not present

## 2019-11-07 DIAGNOSIS — D126 Benign neoplasm of colon, unspecified: Secondary | ICD-10-CM

## 2019-11-07 DIAGNOSIS — Z9049 Acquired absence of other specified parts of digestive tract: Secondary | ICD-10-CM | POA: Diagnosis not present

## 2019-11-07 HISTORY — PX: POLYPECTOMY: SHX5525

## 2019-11-07 HISTORY — PX: COLONOSCOPY WITH PROPOFOL: SHX5780

## 2019-11-07 HISTORY — PX: BIOPSY: SHX5522

## 2019-11-07 HISTORY — PX: ESOPHAGOGASTRODUODENOSCOPY (EGD) WITH PROPOFOL: SHX5813

## 2019-11-07 LAB — POCT I-STAT, CHEM 8
BUN: 24 mg/dL — ABNORMAL HIGH (ref 8–23)
Calcium, Ion: 1.27 mmol/L (ref 1.15–1.40)
Chloride: 107 mmol/L (ref 98–111)
Creatinine, Ser: 1.5 mg/dL — ABNORMAL HIGH (ref 0.44–1.00)
Glucose, Bld: 246 mg/dL — ABNORMAL HIGH (ref 70–99)
HCT: 40 % (ref 36.0–46.0)
Hemoglobin: 13.6 g/dL (ref 12.0–15.0)
Potassium: 4.8 mmol/L (ref 3.5–5.1)
Sodium: 141 mmol/L (ref 135–145)
TCO2: 23 mmol/L (ref 22–32)

## 2019-11-07 LAB — GLUCOSE, CAPILLARY
Glucose-Capillary: 242 mg/dL — ABNORMAL HIGH (ref 70–99)
Glucose-Capillary: 187 mg/dL — ABNORMAL HIGH (ref 70–99)

## 2019-11-07 SURGERY — ESOPHAGOGASTRODUODENOSCOPY (EGD) WITH PROPOFOL
Anesthesia: Monitor Anesthesia Care

## 2019-11-07 MED ORDER — ONDANSETRON HCL 4 MG/2ML IJ SOLN
INTRAMUSCULAR | Status: DC | PRN
  Administered 2019-11-07: 4 mg via INTRAVENOUS

## 2019-11-07 MED ORDER — LACTATED RINGERS IV SOLN: INTRAVENOUS | Status: DC | PRN

## 2019-11-07 MED ORDER — SODIUM CHLORIDE 0.9 % IV SOLN: INTRAVENOUS | Status: DC

## 2019-11-07 MED ORDER — PROPOFOL 500 MG/50ML IV EMUL
INTRAVENOUS | Status: DC | PRN
  Administered 2019-11-07: 30 ug/kg/min via INTRAVENOUS

## 2019-11-07 MED ORDER — LIDOCAINE 2% (20 MG/ML) 5 ML SYRINGE
INTRAMUSCULAR | Status: DC | PRN
  Administered 2019-11-07: 80 mg via INTRAVENOUS

## 2019-11-07 MED ORDER — MIDAZOLAM HCL 5 MG/5ML IJ SOLN
INTRAMUSCULAR | Status: DC | PRN
  Administered 2019-11-07: .5 mg via INTRAVENOUS

## 2019-11-07 MED ORDER — LACTATED RINGERS IV SOLN: INTRAVENOUS | Status: DC

## 2019-11-07 MED ORDER — PROPOFOL 10 MG/ML IV BOLUS
INTRAVENOUS | Status: DC | PRN
  Administered 2019-11-07: 150 mg via INTRAVENOUS

## 2019-11-07 MED ORDER — MIDAZOLAM HCL 2 MG/2ML IJ SOLN
INTRAMUSCULAR | Status: AC
  Filled 2019-11-07: qty 2

## 2019-11-07 SURGICAL SUPPLY — 25 items

## 2019-11-07 NOTE — H&P (Signed)
[facility] History and Physical   Primary Care Physician:  Juanda Chance   Reason for Procedure:   Iron deficiency anemia Plan:    EGD and colonoscopy     HPI: MARIPOSA SHORES is a 74 y.o. female here for EGD and colonoscopy for further evaluation of iron deficiency anemia BMI>50 The risks and benefits as well as alternatives of endoscopic procedure(s) have been discussed and reviewed. All questions answered. The patient agrees to proceed.    Past Medical History:  Diagnosis Date  . Adult stuttering   . Anemia   . Anxiety    Panic attack- - 1 year ago (cries - doneest know why and gets nervous  . Arthritis   . 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 CAD (coronary artery disease)   . Colon polyp    polypoid colorectal mucosa  . Complication of anesthesia   . Depression   . Diverticulosis   . DJD (degenerative joint disease)    left knee  . Esophagitis   . Gastritis   . Glaucoma   . Helicobacter pylori (H. pylori)   . Hemorrhoids   . Hernia of unspecified site of abdominal cavity without mention of obstruction or gangrene   . History of blood transfusion   . History of kidney stones   . Hypertension 09 07 2013   TRANSTHORACIC ECHO STUDY CONCLUSIONS   . Hypertension 09 07 2013   EJECTION FRACTION- 55%-60% .WALL MOTION WAS NORMAL  . Impaired mobility   . Iron deficiency anemia   . Morbid obesity ([place])   . Neuropathy   . Obesity   . Olecranon bursitis of left elbow   . OSA (obstructive sleep apnea)    On cpap  . Osteoarthritis    right hip  . Osteoporosis   . Primary osteoarthritis    Bilaterally (knee)  . PSVT (paroxysmal supraventricular tachycardia) ([place])   . PSVT (paroxysmal supraventricular tachycardia) ([place])   . Short-term memory loss   . Shortness of breath dyspnea   . Sleep apnea    on CPAP- does not use machine  . Spinal headache 1986  . Type 2 diabetes mellitus ([place])     Past Surgical History:  Procedure Laterality Date  .  CARDIAC CATHETERIZATION  2003    normal coronary arteries and nomal LV function by cath performed by Dr.Peter Nishan  . CESAREAN SECTION  1986  . CHOLECYSTECTOMY    . ENTEROSCOPY  02/05/2012   Procedure: ENTEROSCOPY;  Surgeon: Lafayette Dragon, MD;  Location: [facility] ENDOSCOPY;  Service: Endoscopy;  Laterality: N/A;  . ESOPHAGOGASTRODUODENOSCOPY N/A 04/19/2014   Procedure: ESOPHAGOGASTRODUODENOSCOPY (EGD);  Surgeon: Lafayette Dragon, MD;  Location: [facility];  Service: Endoscopy;  Laterality: N/A;  . ESOPHAGOGASTRODUODENOSCOPY (EGD) WITH PROPOFOL N/A 08/01/2015   Procedure: ESOPHAGOGASTRODUODENOSCOPY (EGD) WITH PROPOFOL;  Surgeon: Mauri Pole, MD;  Location: [facility]OSCOPY;  Service: Endoscopy;  Laterality: N/A;  . HERNIA REPAIR    . HOT HEMOSTASIS  02/05/2012   Procedure: HOT HEMOSTASIS (ARGON PLASMA COAGULATION/BICAP);  Surgeon: Lafayette Dragon, MD;  Location: [facility];  Service: Endoscopy;  Laterality: N/A;  . KNEE ARTHROSCOPY Left   . left sided lithotripsy    . TUBAL LIGATION      Prior to Admission medications   Medication Sig Start Date End Date Taking? Authorizing Provider  acetaminophen (TYLENOL) 325 MG tablet Take 650 mg by mouth every 6 (six) hours as needed for moderate pain or headache.   Yes [provider]  albuterol (VENTOLIN HFA) 108 (90 Base) MCG/ACT inhaler Inhale 2 puffs into the lungs every 4 (four) hours as needed for wheezing or shortness of breath.   Yes [provider]  ALPRAZolam (XANAX) 0.25 MG tablet Take 0.25 mg by mouth 2 (two) times daily as needed for anxiety.  08/17/12  Yes [provider]  aspirin EC 81 MG tablet Take 162 mg by mouth daily.   Yes [provider]  atorvastatin (LIPITOR) 20 MG tablet Take 20 mg by mouth daily.   Yes [provider]  Cyanocobalamin (B-12 PO) Take 1 capsule by mouth daily.   Yes [provider]  desonide (DESOWEN) 0.05 % cream Apply 1 application topically 2 (two) times daily as  needed (dry skin/irritation).    Yes [provider]  diclofenac sodium (VOLTAREN) 1 % GEL Apply 2 g topically 4 (four) times daily. Patient taking differently: Apply 2 g topically daily as needed (pain).  04/22/12  Yes Prueter, Santiago Glad, PA-C  dicyclomine (BENTYL) 20 MG tablet TAKE 1 TABLET(20 MG) BY MOUTH THREE TIMES DAILY AFTER MEALS Patient taking differently: Take 20 mg by mouth 2 (two) times daily.  05/19/17  Yes Giannamarie Paulus, Venia Minks, MD  diphenhydrAMINE (BENADRYL) 25 MG tablet Take 25 mg by mouth daily as needed for itching.   Yes [provider]  DULoxetine (CYMBALTA) 60 MG capsule Take 60 mg by mouth daily.   Yes [provider]  ferrous sulfate 325 (65 FE) MG tablet Take 325 mg by mouth daily.   Yes [provider]  fluticasone (FLONASE) 50 MCG/ACT nasal spray Place 1 spray into both nostrils daily as needed for allergies or rhinitis.   Yes [provider]  glipiZIDE (GLUCOTROL XL) 10 MG 24 hr tablet Take 10 mg by mouth daily.   Yes [provider]  HYDROcodone-acetaminophen (NORCO) 7.5-325 MG per tablet Take 1 tablet by mouth daily as needed for severe pain.  05/17/14  Yes [provider]  latanoprost (XALATAN) 0.005 % ophthalmic solution Place 1 drop into both eyes at bedtime.  12/05/11  Yes [provider]  losartan-hydrochlorothiazide (HYZAAR) 100-12.5 MG tablet Take 1 tablet by mouth daily. 07/02/15  Yes [provider]  metFORMIN (GLUCOPHAGE) 1000 MG tablet Take 1,000 mg by mouth 2 (two) times daily with a meal.    Yes [provider]  metoprolol tartrate (LOPRESSOR) 50 MG tablet Take 50 mg by mouth 2 (two) times daily.   Yes [provider]  Mouthwashes (BIOTENE/CALCIUM MT) Take 1 Dose by mouth daily as needed (dry mouth).    Yes [provider]  Na Sulfate-K Sulfate-Mg Sulf 17.5-3.13-1.6 GM/177ML SOLN Suprep-Use as directed 03/30/19  Yes Michell Kader, Venia Minks, MD  omeprazole (PRILOSEC) 40  MG capsule TAKE 1 CAPSULE(40 MG) BY MOUTH DAILY Patient taking differently: Take 40 mg by mouth daily.  04/28/17  Yes Tylee Yum, Venia Minks, MD  pregabalin (LYRICA) 50 MG capsule Take 50 mg by mouth 2 (two) times daily.   Yes [provider]  psyllium (METAMUCIL SMOOTH TEXTURE) 28 % packet Take 1 packet by mouth daily as needed (constipation).    Yes [provider]  saccharomyces boulardii (FLORASTOR) 250 MG capsule Take 1 capsule (250 mg total) by mouth daily. 07/27/18  Yes Jaydan Meidinger, Venia Minks, MD  Vitamin D, Ergocalciferol, (DRISDOL) 1.25 MG (50000 UNIT) CAPS capsule Take 50,000 Units by mouth every Wednesday.   Yes [provider]    Current Facility-Administered Medications  Medication Dose Route Frequency Provider Last  Rate Last Admin  . 0.9 %  sodium chloride infusion   Intravenous Continuous Karron Alvizo V, MD      . lactated ringers infusion   Intravenous Continuous Mauri Pole, MD 10 mL/hr at 11/07/19 1038 New Bag at 11/07/19 1038    Allergies as of 09/05/2019 - Review Complete 04/05/2019  Allergen Reaction Noted  . Fosamax [alendronate sodium] Anaphylaxis 07/21/2014  . Codeine Rash 10/02/2010  . Nsaids  02/04/2011    Family History  Problem Relation Age of Onset  . Heart disease Mother   . Heart disease Father   . Diabetes Brother   . Diabetes Sister   . Kidney cancer Brother   . Colon cancer Neg Hx     Social History   Socioeconomic History  . Marital status: Widowed    Spouse name: Not on file  . Number of children: 1  . Years of education: Not on file  . Highest education level: Some college, no degree  Occupational History  . Occupation: RETIRED    Employer: RETIRED  Tobacco Use  . Smoking status: Never Smoker  . Smokeless tobacco: Never Used  Vaping Use  . Vaping Use: Never used  Substance and Sexual Activity  . Alcohol use: Yes    Alcohol/week: 1.0 standard drink    Types: 1 Glasses of wine per week    Comment:  wine sometimes on holidays and bedtime- rarely  . Drug use: No  . Sexual activity: Not on file  Other Topics Concern  . Not on file  Social History Narrative   04/29/18 Lives alone, dgtr visits, stays often   Caffeine- coffee once a week, sodas rarely   Social Determinants of Health   Financial Resource Strain:   . Difficulty of Paying Living Expenses: Not on file  Food Insecurity:   . Worried About Charity fundraiser in the Last Year: Not on file  . Ran Out of Food in the Last Year: Not on file  Transportation Needs:   . Lack of Transportation (Medical): Not on file  . Lack of Transportation (Non-Medical): Not on file  Physical Activity:   . Days of Exercise per Week: Not on file  . Minutes of Exercise per Session: Not on file  Stress:   . Feeling of Stress : Not on file  Social Connections:   . Frequency of Communication with Friends and Family: Not on file  . Frequency of Social Gatherings with Friends and Family: Not on file  . Attends Religious Services: Not on file  . Active Member of Clubs or Organizations: Not on file  . Attends Archivist Meetings: Not on file  . Marital Status: Not on file  Intimate Partner Violence:   . Fear of Current or Ex-Partner: Not on file  . Emotionally Abused: Not on file  . Physically Abused: Not on file  . Sexually Abused: Not on file    Review of Systems:  All other review of systems negative except as mentioned in the HPI.  Physical Exam: Vital signs in last 24 hours: Temp:  [98.3 F (36.8 C)] 98.3 F (36.8 C) (10/25 1027) Pulse Rate:  [93] 93 (10/25 1027) Resp:  [20] 20 (10/25 1027) BP: (131)/(61) 131/61 (10/25 1027) SpO2:  [96 %] 96 % (10/25 1027) Weight:  [128.4 kg] 128.4 kg (10/25 1027)   General:   Alert,  Well-developed, well-nourished, pleasant and cooperative in NAD Lungs:  Clear throughout to auscultation.   Heart:  Regular rate  and rhythm; no murmurs, clicks, rubs,  or gallops. Abdomen:  Soft,  nontender and nondistended. Normal bowel sounds.   Neuro/Psych:  Alert and cooperative. Normal mood and affect. A and O x 3   K. Denzil Magnuson , MD [phone]

## 2019-11-07 NOTE — Op Note (Signed)
[facility] Patient Name: Jade Ellison Procedure Date: 11/07/2019 MRN: [id number] Attending MD: Mauri Pole , MD Date of Birth: 12-Aug-1945 CSN: [id number] Age: 74 Admit Type: Outpatient Procedure:                Colonoscopy Indications:              Unexplained iron deficiency anemia Providers:                Mauri Pole, MD, Josie Dixon, RN, Particia Nearing, RN Referring MD:              Medicines:                Monitored Anesthesia Care Complications:            No immediate complications. Estimated Blood Loss:     Estimated blood loss was minimal. Procedure:                Pre-Anesthesia Assessment:                           - Prior to the procedure, a History and Physical                            was performed, and patient medications and                            allergies were reviewed. The patient's tolerance of                            previous anesthesia was also reviewed. The risks                            and benefits of the procedure and the sedation                            options and risks were discussed with the patient.                            All questions were answered, and informed consent                            was obtained. Prior Anticoagulants: The patient has                            taken no previous anticoagulant or antiplatelet                            agents. ASA Grade Assessment: III - A patient with                            severe systemic disease. After reviewing the risks  and benefits, the patient was deemed in                            satisfactory condition to undergo the procedure.                           After obtaining informed consent, the colonoscope                            was passed under direct vision. Throughout the                            procedure, the patient's blood pressure, pulse, and                             oxygen saturations were monitored continuously. The                            [id number]-H190DL ([id number]) Olympus pediatric colonscope                            was introduced through the anus and advanced to the                            the cecum, identified by appendiceal orifice and                            ileocecal valve. The colonoscopy was performed                            without difficulty. The patient tolerated the                            procedure well. The quality of the bowel                            preparation was good. Scope In: 11:47:08 AM Scope Out: 84:16:60 PM Scope Withdrawal Time: 0 hours 17 minutes 4 seconds  Total Procedure Duration: 0 hours 21 minutes 36 seconds  Findings:      The perianal and digital rectal examinations were normal.      Two sessile polyps were found in the transverse colon and ascending       colon. The polyps were 1 to 2 mm in size. These polyps were removed with       a cold biopsy forceps. Resection and retrieval were complete.      A 5 mm polyp was found in the transverse colon. The polyp was sessile.       The polyp was removed with a cold snare. Resection and retrieval were       complete.      Scattered small-mouthed diverticula were found in the sigmoid colon.       There was evidence of diverticular spasm.      Non-bleeding external and internal hemorrhoids were found during       retroflexion. The hemorrhoids were medium-sized.      The  exam was otherwise without abnormality. Impression:               - Two 1 to 2 mm polyps in the transverse colon and                            in the ascending colon, removed with a cold biopsy                            forceps. Resected and retrieved.                           - One 5 mm polyp in the transverse colon, removed                            with a cold snare. Resected and retrieved.                           - Moderate diverticulosis in the sigmoid colon.                             There was evidence of diverticular spasm.                           - Non-bleeding external and internal hemorrhoids.                           - The examination was otherwise normal. Moderate Sedation:      Not Applicable - Patient had care per Anesthesia. Recommendation:           - Patient has a contact number available for                            emergencies. The signs and symptoms of potential                            delayed complications were discussed with the                            patient. Return to normal activities tomorrow.                            Written discharge instructions were provided to the                            patient.                           - Resume previous diet.                           - Continue present medications.                           - Await pathology results.                           -  Repeat colonoscopy date to be determined after                            pending pathology results are reviewed for                            surveillance based on pathology results. Procedure Code(s):        --- Professional ---                           [phone], Colonoscopy, flexible; with removal of                            tumor(s), polyp(s), or other lesion(s) by snare                            technique                           45380, 29, Colonoscopy, flexible; with biopsy,                            single or multiple Diagnosis Code(s):        --- Professional ---                           K63.5, Polyp of colon                           K64.8, Other hemorrhoids                           D50.9, Iron deficiency anemia, unspecified                           K57.30, Diverticulosis of large intestine without                            perforation or abscess without bleeding CPT copyright 2019 American Medical Association. All rights reserved. The codes documented in this report are preliminary and upon coder review may  be revised to  meet current compliance requirements. Mauri Pole, MD 11/07/2019 12:16:34 PM This report has been signed electronically. Number of Addenda: 0

## 2019-11-07 NOTE — Op Note (Signed)
[facility] Patient Name: Jade Ellison Procedure Date: 11/07/2019 MRN: [id number] Attending MD: Mauri Pole , MD Date of Birth: 1945-09-08 CSN: [id number] Age: 74 Admit Type: Outpatient Procedure:                Upper GI endoscopy Indications:              Suspected upper gastrointestinal bleeding in                            patient with unexplained iron deficiency anemia Providers:                Mauri Pole, MD, Josie Dixon, RN, Particia Nearing, RN Referring MD:              Medicines:                Monitored Anesthesia Care Complications:            No immediate complications. Estimated Blood Loss:     Estimated blood loss: none. Procedure:                Pre-Anesthesia Assessment:                           - Prior to the procedure, a History and Physical                            was performed, and patient medications and                            allergies were reviewed. The patient's tolerance of                            previous anesthesia was also reviewed. The risks                            and benefits of the procedure and the sedation                            options and risks were discussed with the patient.                            All questions were answered, and informed consent                            was obtained. Prior Anticoagulants: The patient has                            taken no previous anticoagulant or antiplatelet                            agents. ASA Grade Assessment: III - A patient with  severe systemic disease. After reviewing the risks                            and benefits, the patient was deemed in                            satisfactory condition to undergo the procedure.                           After obtaining informed consent, the endoscope was                            passed under direct vision. Throughout the                             procedure, the patient's blood pressure, pulse, and                            oxygen saturations were monitored continuously. The                            [id number]-H190 ([id number]) was introduced through the                            mouth, and advanced to the second part of duodenum.                            The upper GI endoscopy was accomplished without                            difficulty. The patient tolerated the procedure                            well. Findings:      The Z-line was regular and was found 38 cm from the incisors.      The examined esophagus was normal.      A small hiatal hernia was present.      The cardia and gastric fundus were normal on retroflexion.      The stomach was normal.      The examined duodenum was normal. Impression:               - Z-line regular, 38 cm from the incisors.                           - Normal esophagus.                           - Small hiatal hernia.                           - Normal stomach.                           - Normal examined duodenum.                           -  No specimens collected. Moderate Sedation:      Not Applicable - Patient had care per Anesthesia. Recommendation:           - Patient has a contact number available for                            emergencies. The signs and symptoms of potential                            delayed complications were discussed with the                            patient. Return to normal activities tomorrow.                            Written discharge instructions were provided to the                            patient.                           - Resume previous diet.                           - Continue present medications.                           - See the other procedure note for documentation of                            additional recommendations. Procedure Code(s):        --- Professional ---                           [phone], Esophagogastroduodenoscopy, flexible,                             transoral; diagnostic, including collection of                            specimen(s) by brushing or washing, when performed                            (separate procedure) Diagnosis Code(s):        --- Professional ---                           K44.9, Diaphragmatic hernia without obstruction or                            gangrene                           D50.9, Iron deficiency anemia, unspecified CPT copyright 2019 American Medical Association. All rights reserved. The codes documented in this report are preliminary and upon coder review may  be revised to meet current compliance requirements. Mauri Pole, MD 11/07/2019 12:13:30 PM  This report has been signed electronically. Number of Addenda: 0

## 2019-11-07 NOTE — Discharge Instructions (Signed)
YOU HAD AN ENDOSCOPIC PROCEDURE TODAY: Refer to the procedure report and other information in the discharge instructions given to you for any specific questions about what was found during the examination. If this information does not answer your questions, please call [place] office at [phone] to clarify.  ° °YOU SHOULD EXPECT: Some feelings of bloating in the abdomen. Passage of more gas than usual. Walking can help get rid of the air that was put into your GI tract during the procedure and reduce the bloating. If you had a lower endoscopy (such as a colonoscopy or flexible sigmoidoscopy) you may notice spotting of blood in your stool or on the toilet paper. Some abdominal soreness may be present for a day or two, also. ° °DIET: Your first meal following the procedure should be a light meal and then it is ok to progress to your normal diet. A half-sandwich or bowl of soup is an example of a good first meal. Heavy or fried foods are harder to digest and may make you feel nauseous or bloated. Drink plenty of fluids but you should avoid alcoholic beverages for 24 hours. If you had a esophageal dilation, please see attached instructions for diet.   ° °ACTIVITY: Your care partner should take you home directly after the procedure. You should plan to take it easy, moving slowly for the rest of the day. You can resume normal activity the day after the procedure however YOU SHOULD NOT DRIVE, use power tools, machinery or perform tasks that involve climbing or major physical exertion for 24 hours (because of the sedation medicines used during the test).  ° °SYMPTOMS TO REPORT IMMEDIATELY: °A gastroenterologist can be reached at any hour. Please call [phone]  for any of the following symptoms:  °Following lower endoscopy (colonoscopy, flexible sigmoidoscopy) °Excessive amounts of blood in the stool  °Significant tenderness, worsening of abdominal pains  °Swelling of the abdomen that is new, acute  °Fever of 100° or  higher  °Following upper endoscopy (EGD, EUS, ERCP, esophageal dilation) °Vomiting of blood or coffee ground material  °New, significant abdominal pain  °New, significant chest pain or pain under the shoulder blades  °Painful or persistently difficult swallowing  °New shortness of breath  °Black, tarry-looking or red, bloody stools ° °FOLLOW UP:  °If any biopsies were taken you will be contacted by phone or by letter within the next 1-3 weeks. Call [phone]  if you have not heard about the biopsies in 3 weeks.  °Please also call with any specific questions about appointments or follow up tests. ° °

## 2019-11-07 NOTE — Anesthesia Preprocedure Evaluation (Addendum)
Anesthesia Evaluation  Patient identified by MRN, date of birth, ID band Patient awake    Reviewed: Allergy & Precautions, NPO status , Patient's Chart, lab work & pertinent test results  History of Anesthesia Complications (+) POST - OP SPINAL HEADACHE and history of anesthetic complications  Airway Mallampati: II  TM Distance: >3 FB Neck ROM: Full    Dental   Gold teeth:   Pulmonary sleep apnea and Continuous Positive Airway Pressure Ventilation ,    Pulmonary exam normal breath sounds clear to auscultation       Cardiovascular hypertension, Pt. on medications and Pt. on home beta blockers + CAD  Normal cardiovascular exam+ dysrhythmias  Rhythm:Regular Rate:Normal     Neuro/Psych  Headaches, PSYCHIATRIC DISORDERS Anxiety Depression    GI/Hepatic Neg liver ROS, Bowel prep,GERD  Medicated and Controlled,  Endo/Other  diabetes, Oral Hypoglycemic AgentsMorbid obesity (Super)  Renal/GU negative Renal ROS     Musculoskeletal  (+) Arthritis ,   Abdominal   Peds  Hematology HLD   Anesthesia Other Findings anemia  Reproductive/Obstetrics                            Anesthesia Physical Anesthesia Plan  ASA: IV  Anesthesia Plan: MAC   Post-op Pain Management:    Induction:   PONV Risk Score and Plan: 2 and Propofol infusion and Treatment may vary due to age or medical condition  Airway Management Planned: Nasal Cannula  Additional Equipment:   Intra-op Plan:   Post-operative Plan:   Informed Consent: I have reviewed the patients History and Physical, chart, labs and discussed the procedure including the risks, benefits and alternatives for the proposed anesthesia with the patient or authorized representative who has indicated his/her understanding and acceptance.     Dental advisory given  Plan Discussed with: CRNA  Anesthesia Plan Comments:        Anesthesia Quick  Evaluation

## 2019-11-07 NOTE — Transfer of Care (Signed)
Immediate Anesthesia Transfer of Care Note  Patient: Jade Ellison  Procedure(s) Performed: ESOPHAGOGASTRODUODENOSCOPY (EGD) WITH PROPOFOL (N/A ) COLONOSCOPY WITH PROPOFOL (N/A ) POLYPECTOMY  Patient Location: PACU  Anesthesia Type:MAC  Level of Consciousness: awake, alert , oriented and patient cooperative  Airway & Oxygen Therapy: Patient Spontanous Breathing and Patient connected to face mask oxygen  Post-op Assessment: Report given to RN, Post -op Vital signs reviewed and stable and Patient moving all extremities X 4  Post vital signs: stable  Last Vitals:  Vitals Value Taken Time  BP 135/71 11/07/19 1217  Temp    Pulse    Resp 20 11/07/19 1219  SpO2    Vitals shown include unvalidated device data.  Last Pain:  Vitals:   11/07/19 1027  TempSrc: Oral  PainSc: 0-No pain         Complications: No complications documented.

## 2019-11-07 NOTE — Anesthesia Postprocedure Evaluation (Signed)
Anesthesia Post Note  Patient: Jade Ellison  Procedure(s) Performed: ESOPHAGOGASTRODUODENOSCOPY (EGD) WITH PROPOFOL (N/A ) COLONOSCOPY WITH PROPOFOL (N/A ) POLYPECTOMY     Patient location during evaluation: Endoscopy Anesthesia Type: MAC Level of consciousness: awake and alert Pain management: pain level controlled Vital Signs Assessment: post-procedure vital signs reviewed and stable Respiratory status: spontaneous breathing, nonlabored ventilation, respiratory function stable and patient connected to nasal cannula oxygen Cardiovascular status: stable and blood pressure returned to baseline Postop Assessment: no apparent nausea or vomiting Anesthetic complications: no   No complications documented.  Last Vitals:  Vitals:   11/07/19 1230 11/07/19 1240  BP: (!) 150/76 (!) 148/72  Pulse: (!) 106 85  Resp: 20 19  Temp:    SpO2: 93% 96%    Last Pain:  Vitals:   11/07/19 1027  TempSrc: Oral  PainSc: 0-No pain                 Nicklas Mcsweeney P Spencer Cardinal

## 2019-11-08 ENCOUNTER — Encounter (HOSPITAL_COMMUNITY): Payer: Self-pay | Admitting: Gastroenterology

## 2019-11-08 LAB — SURGICAL PATHOLOGY

## 2019-11-21 ENCOUNTER — Encounter: Payer: Self-pay | Admitting: Gastroenterology

## 2020-10-13 LAB — HM COLONOSCOPY

## 2021-08-23 LAB — HM DEXA SCAN

## 2021-10-06 ENCOUNTER — Observation Stay (HOSPITAL_COMMUNITY): Payer: Medicare HMO

## 2021-10-06 ENCOUNTER — Emergency Department (HOSPITAL_COMMUNITY): Payer: Medicare HMO

## 2021-10-06 ENCOUNTER — Encounter (HOSPITAL_COMMUNITY): Payer: Self-pay

## 2021-10-06 ENCOUNTER — Inpatient Hospital Stay (HOSPITAL_COMMUNITY)
Admission: EM | Admit: 2021-10-06 | Discharge: 2021-10-08 | DRG: 065 | Disposition: A | Discharge status: Home Health | Payer: Medicare HMO | Attending: Family Medicine | Admitting: Family Medicine

## 2021-10-06 DIAGNOSIS — I130 Hypertensive heart and chronic kidney disease with heart failure and stage 1 through stage 4 chronic kidney disease, or unspecified chronic kidney disease: Secondary | ICD-10-CM | POA: Diagnosis present

## 2021-10-06 DIAGNOSIS — E1169 Type 2 diabetes mellitus with other specified complication: Secondary | ICD-10-CM

## 2021-10-06 DIAGNOSIS — I10 Essential (primary) hypertension: Secondary | ICD-10-CM

## 2021-10-06 DIAGNOSIS — M810 Age-related osteoporosis without current pathological fracture: Secondary | ICD-10-CM | POA: Diagnosis present

## 2021-10-06 DIAGNOSIS — N1831 Chronic kidney disease, stage 3a: Secondary | ICD-10-CM | POA: Diagnosis not present

## 2021-10-06 DIAGNOSIS — Z556 Problems related to health literacy: Secondary | ICD-10-CM

## 2021-10-06 DIAGNOSIS — Z886 Allergy status to analgesic agent status: Secondary | ICD-10-CM

## 2021-10-06 DIAGNOSIS — Z833 Family history of diabetes mellitus: Secondary | ICD-10-CM

## 2021-10-06 DIAGNOSIS — Z7984 Long term (current) use of oral hypoglycemic drugs: Secondary | ICD-10-CM

## 2021-10-06 DIAGNOSIS — Z6841 Body Mass Index (BMI) 40.0 and over, adult: Secondary | ICD-10-CM

## 2021-10-06 DIAGNOSIS — Z993 Dependence on wheelchair: Secondary | ICD-10-CM

## 2021-10-06 DIAGNOSIS — G4733 Obstructive sleep apnea (adult) (pediatric): Secondary | ICD-10-CM

## 2021-10-06 DIAGNOSIS — E1122 Type 2 diabetes mellitus with diabetic chronic kidney disease: Secondary | ICD-10-CM

## 2021-10-06 DIAGNOSIS — K219 Gastro-esophageal reflux disease without esophagitis: Secondary | ICD-10-CM

## 2021-10-06 DIAGNOSIS — E782 Mixed hyperlipidemia: Secondary | ICD-10-CM

## 2021-10-06 DIAGNOSIS — I480 Paroxysmal atrial fibrillation: Secondary | ICD-10-CM | POA: Diagnosis present

## 2021-10-06 DIAGNOSIS — Z87440 Personal history of urinary (tract) infections: Secondary | ICD-10-CM

## 2021-10-06 DIAGNOSIS — F32A Depression, unspecified: Secondary | ICD-10-CM | POA: Diagnosis present

## 2021-10-06 DIAGNOSIS — R29701 NIHSS score 1: Secondary | ICD-10-CM | POA: Diagnosis present

## 2021-10-06 DIAGNOSIS — Z7982 Long term (current) use of aspirin: Secondary | ICD-10-CM

## 2021-10-06 DIAGNOSIS — F419 Anxiety disorder, unspecified: Secondary | ICD-10-CM | POA: Diagnosis present

## 2021-10-06 DIAGNOSIS — N3000 Acute cystitis without hematuria: Secondary | ICD-10-CM | POA: Diagnosis present

## 2021-10-06 DIAGNOSIS — Z885 Allergy status to narcotic agent status: Secondary | ICD-10-CM

## 2021-10-06 DIAGNOSIS — I6381 Other cerebral infarction due to occlusion or stenosis of small artery: Principal | ICD-10-CM | POA: Diagnosis present

## 2021-10-06 DIAGNOSIS — I639 Cerebral infarction, unspecified: Secondary | ICD-10-CM | POA: Diagnosis not present

## 2021-10-06 DIAGNOSIS — R4701 Aphasia: Secondary | ICD-10-CM | POA: Diagnosis present

## 2021-10-06 DIAGNOSIS — H409 Unspecified glaucoma: Secondary | ICD-10-CM | POA: Diagnosis present

## 2021-10-06 DIAGNOSIS — Z8249 Family history of ischemic heart disease and other diseases of the circulatory system: Secondary | ICD-10-CM

## 2021-10-06 DIAGNOSIS — I2510 Atherosclerotic heart disease of native coronary artery without angina pectoris: Secondary | ICD-10-CM | POA: Diagnosis present

## 2021-10-06 DIAGNOSIS — Z888 Allergy status to other drugs, medicaments and biological substances status: Secondary | ICD-10-CM

## 2021-10-06 DIAGNOSIS — Z79899 Other long term (current) drug therapy: Secondary | ICD-10-CM

## 2021-10-06 DIAGNOSIS — I5032 Chronic diastolic (congestive) heart failure: Secondary | ICD-10-CM | POA: Diagnosis present

## 2021-10-06 DIAGNOSIS — Z91199 Patient's noncompliance with other medical treatment and regimen due to unspecified reason: Secondary | ICD-10-CM

## 2021-10-06 LAB — CBG MONITORING, ED: Glucose-Capillary: 110 mg/dL — ABNORMAL HIGH (ref 70–99)

## 2021-10-06 LAB — CBC WITH DIFFERENTIAL/PLATELET
Abs Immature Granulocytes: 0.03 10*3/uL (ref 0.00–0.07)
Basophils Absolute: 0 10*3/uL (ref 0.0–0.1)
Basophils Relative: 1 %
Eosinophils Absolute: 0.2 10*3/uL (ref 0.0–0.5)
Eosinophils Relative: 2 %
HCT: 36.7 % (ref 36.0–46.0)
Hemoglobin: 11.4 g/dL — ABNORMAL LOW (ref 12.0–15.0)
Immature Granulocytes: 1 %
Lymphocytes Relative: 16 %
Lymphs Abs: 1 10*3/uL (ref 0.7–4.0)
MCH: 26.3 pg (ref 26.0–34.0)
MCHC: 31.1 g/dL (ref 30.0–36.0)
MCV: 84.6 fL (ref 80.0–100.0)
Monocytes Absolute: 0.6 10*3/uL (ref 0.1–1.0)
Monocytes Relative: 9 %
Neutro Abs: 4.4 10*3/uL (ref 1.7–7.7)
Neutrophils Relative %: 71 %
Platelets: 181 10*3/uL (ref 150–400)
RBC: 4.34 MIL/uL (ref 3.87–5.11)
RDW: 14.8 % (ref 11.5–15.5)
WBC: 6.1 10*3/uL (ref 4.0–10.5)
nRBC: 0 % (ref 0.0–0.2)

## 2021-10-06 LAB — COMPREHENSIVE METABOLIC PANEL
ALT: 11 U/L (ref 0–44)
AST: 21 U/L (ref 15–41)
Albumin: 3.2 g/dL — ABNORMAL LOW (ref 3.5–5.0)
Alkaline Phosphatase: 63 U/L (ref 38–126)
Anion gap: 11 (ref 5–15)
BUN: 14 mg/dL (ref 8–23)
CO2: 26 mmol/L (ref 22–32)
Calcium: 8.9 mg/dL (ref 8.9–10.3)
Chloride: 99 mmol/L (ref 98–111)
Creatinine, Ser: 1.02 mg/dL — ABNORMAL HIGH (ref 0.44–1.00)
GFR, Estimated: 57 mL/min — ABNORMAL LOW (ref >60)
Glucose, Bld: 124 mg/dL — ABNORMAL HIGH (ref 70–99)
Potassium: 3.7 mmol/L (ref 3.5–5.1)
Sodium: 136 mmol/L (ref 135–145)
Total Bilirubin: 0.9 mg/dL (ref 0.3–1.2)
Total Protein: 6.1 g/dL — ABNORMAL LOW (ref 6.5–8.1)

## 2021-10-06 LAB — URINALYSIS, ROUTINE W REFLEX MICROSCOPIC
Bilirubin Urine: NEGATIVE
Glucose, UA: NEGATIVE mg/dL
Ketones, ur: NEGATIVE mg/dL
Nitrite: POSITIVE — AB
Protein, ur: 100 mg/dL — AB
Specific Gravity, Urine: 1.015 (ref 1.005–1.030)
pH: 6.5 (ref 5.0–8.0)

## 2021-10-06 LAB — RAPID URINE DRUG SCREEN, HOSP PERFORMED
Amphetamines: NOT DETECTED
Barbiturates: NOT DETECTED
Benzodiazepines: NOT DETECTED
Cocaine: NOT DETECTED
Opiates: NOT DETECTED
Tetrahydrocannabinol: NOT DETECTED

## 2021-10-06 LAB — BRAIN NATRIURETIC PEPTIDE: B Natriuretic Peptide: 488.3 pg/mL — ABNORMAL HIGH (ref 0.0–100.0)

## 2021-10-06 LAB — URINALYSIS, MICROSCOPIC (REFLEX): WBC, UA: >50 WBC/hpf (ref 0–5)

## 2021-10-06 LAB — TROPONIN I (HIGH SENSITIVITY)
Troponin I (High Sensitivity): 17 ng/L (ref <18)
Troponin I (High Sensitivity): 17 ng/L (ref <18)

## 2021-10-06 MED ORDER — PREGABALIN 50 MG PO CAPS
50.0000 mg | ORAL_CAPSULE | Freq: Two times a day (BID) | ORAL | Status: DC
  Administered 2021-10-06 – 2021-10-08 (×4): 50 mg via ORAL
  Filled 2021-10-06: qty 1
  Filled 2021-10-06: qty 2
  Filled 2021-10-06: qty 1
  Filled 2021-10-06: qty 2

## 2021-10-06 MED ORDER — METOPROLOL TARTRATE 50 MG PO TABS
50.0000 mg | ORAL_TABLET | Freq: Two times a day (BID) | ORAL | Status: DC
  Administered 2021-10-06 – 2021-10-08 (×4): 50 mg via ORAL
  Filled 2021-10-06: qty 2
  Filled 2021-10-06: qty 1
  Filled 2021-10-06: qty 2
  Filled 2021-10-06: qty 1

## 2021-10-06 MED ORDER — ASPIRIN 81 MG PO TBEC
81.0000 mg | DELAYED_RELEASE_TABLET | Freq: Every day | ORAL | Status: DC
  Administered 2021-10-07: 81 mg via ORAL
  Filled 2021-10-06: qty 1

## 2021-10-06 MED ORDER — ONDANSETRON HCL 4 MG/2ML IJ SOLN: 4.0000 mg | Freq: Four times a day (QID) | INTRAMUSCULAR | Status: DC | PRN

## 2021-10-06 MED ORDER — HYDRALAZINE HCL 20 MG/ML IJ SOLN: 10.0000 mg | Freq: Four times a day (QID) | INTRAMUSCULAR | Status: DC | PRN

## 2021-10-06 MED ORDER — POLYETHYLENE GLYCOL 3350 17 G PO PACK: 17.0000 g | PACK | Freq: Every day | ORAL | Status: DC | PRN

## 2021-10-06 MED ORDER — ASPIRIN 81 MG PO CHEW
324.0000 mg | CHEWABLE_TABLET | Freq: Once | ORAL | Status: AC
  Administered 2021-10-06: 324 mg via ORAL
  Filled 2021-10-06: qty 4

## 2021-10-06 MED ORDER — ONDANSETRON HCL 4 MG PO TABS: 4.0000 mg | ORAL_TABLET | Freq: Four times a day (QID) | ORAL | Status: DC | PRN

## 2021-10-06 MED ORDER — ACETAMINOPHEN 325 MG PO TABS
650.0000 mg | ORAL_TABLET | Freq: Four times a day (QID) | ORAL | Status: DC | PRN
  Administered 2021-10-07: 650 mg via ORAL
  Filled 2021-10-06: qty 2

## 2021-10-06 MED ORDER — STROKE: EARLY STAGES OF RECOVERY BOOK
Freq: Once | Status: AC
  Filled 2021-10-06: qty 1

## 2021-10-06 MED ORDER — SODIUM CHLORIDE 0.9% FLUSH
3.0000 mL | Freq: Two times a day (BID) | INTRAVENOUS | Status: DC
  Administered 2021-10-06 – 2021-10-08 (×3): 3 mL via INTRAVENOUS

## 2021-10-06 MED ORDER — ALBUTEROL SULFATE (2.5 MG/3ML) 0.083% IN NEBU: 3.0000 mL | INHALATION_SOLUTION | RESPIRATORY_TRACT | Status: DC | PRN

## 2021-10-06 MED ORDER — DULOXETINE HCL 60 MG PO CPEP
60.0000 mg | ORAL_CAPSULE | Freq: Every day | ORAL | Status: DC
  Administered 2021-10-07 – 2021-10-08 (×2): 60 mg via ORAL
  Filled 2021-10-06 (×2): qty 1

## 2021-10-06 MED ORDER — PANTOPRAZOLE SODIUM 40 MG PO TBEC
40.0000 mg | DELAYED_RELEASE_TABLET | Freq: Every day | ORAL | Status: DC
  Administered 2021-10-07 – 2021-10-08 (×2): 40 mg via ORAL
  Filled 2021-10-06 (×2): qty 1

## 2021-10-06 MED ORDER — ATORVASTATIN CALCIUM 10 MG PO TABS
20.0000 mg | ORAL_TABLET | Freq: Every day | ORAL | Status: DC
  Administered 2021-10-07 – 2021-10-08 (×2): 20 mg via ORAL
  Filled 2021-10-06 (×2): qty 2

## 2021-10-06 MED ORDER — SODIUM CHLORIDE 0.9 % IV SOLN
1.0000 g | INTRAVENOUS | Status: DC
  Administered 2021-10-06 – 2021-10-07 (×2): 1 g via INTRAVENOUS
  Filled 2021-10-06 (×2): qty 10

## 2021-10-06 MED ORDER — ACETAMINOPHEN 650 MG RE SUPP: 650.0000 mg | Freq: Four times a day (QID) | RECTAL | Status: DC | PRN

## 2021-10-06 MED ORDER — IOHEXOL 350 MG/ML SOLN
75.0000 mL | Freq: Once | INTRAVENOUS | Status: AC | PRN
  Administered 2021-10-06: 75 mL via INTRAVENOUS

## 2021-10-06 MED ORDER — ENOXAPARIN SODIUM 40 MG/0.4ML IJ SOSY
40.0000 mg | PREFILLED_SYRINGE | INTRAMUSCULAR | Status: DC
  Administered 2021-10-07: 40 mg via SUBCUTANEOUS
  Filled 2021-10-06: qty 0.4

## 2021-10-06 MED ORDER — INSULIN ASPART 100 UNIT/ML IJ SOLN
0.0000 [IU] | Freq: Three times a day (TID) | INTRAMUSCULAR | Status: DC
  Administered 2021-10-07: 3 [IU] via SUBCUTANEOUS
  Administered 2021-10-07: 5 [IU] via SUBCUTANEOUS
  Administered 2021-10-08 (×2): 3 [IU] via SUBCUTANEOUS

## 2021-10-06 MED ORDER — LORAZEPAM 2 MG/ML IJ SOLN
0.5000 mg | Freq: Once | INTRAMUSCULAR | Status: AC
  Administered 2021-10-06: 0.5 mg via INTRAVENOUS
  Filled 2021-10-06: qty 1

## 2021-10-06 NOTE — Assessment & Plan Note (Signed)
   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  Significant chance that this may be a multidrug-resistant organism as patient is already on suppressive therapy.

## 2021-10-06 NOTE — Assessment & Plan Note (Signed)
Strict intake and output monitoring Creatinine near baseline Minimizing nephrotoxic agents as much as possible Serial chemistries to monitor renal function and electrolytes  

## 2021-10-06 NOTE — ED Provider Notes (Signed)
[facility] EMERGENCY DEPARTMENT Provider Note   CSN: [id number] Arrival date & time: 10/06/21  1504     History  Chief Complaint  Patient presents with   Altered Mental Status    Jade Ellison is a 76 y.o. female. With past medical history of hypertension, CAD, IDA, SVT, T2DM who presents to the emergency department with altered mental status.  Presents with daughter who provides the history.  Daughter states that she got her mother up at around 44 this afternoon.  States that she was acting her usual self.  States that she went out to the store and came back and while she was outside one of her children came out and stated that the patient was "not talking right."  States that she came inside and patient was "talking out of her head."  States that she asked her a basic question and the patient responded "the neighbor has vegetables."  States that she then had a stuttering episode after this where she was unable to get words out.  Daughter states that at that time she called EMS.  States that when EMS got there they were asking her to repeat sentences and she was unable to do this.  States that since then her speech has improved and is back to baseline.  At baseline she states that the patient is now wheelchair-bound since she has moved in with daughter.  States that physical therapy is supposed to be coming to see her soon.  States that she is usually weaker on the right side and leans to the right often.  States that she does get frequent UTIs but she denies her ever having until status change with this.  She denies any recent illnesses.  Patient is alert, oriented.  She is not sure why she is here but knows she is at [facility] emergency department.  She denies any pain.  Denies chest pain, palpitations, shortness of breath, abdominal pain, changes to her vision, numbness or tingling to the face, arms, legs.    Altered Mental Status      Home Medications Prior  to Admission medications   Medication Sig Start Date End Date Taking? Authorizing Provider  acetaminophen (TYLENOL) 325 MG tablet Take 650 mg by mouth every 6 (six) hours as needed for moderate pain or headache.    [provider]  albuterol (VENTOLIN HFA) 108 (90 Base) MCG/ACT inhaler Inhale 2 puffs into the lungs every 4 (four) hours as needed for wheezing or shortness of breath.    [provider]  ALPRAZolam Duanne Moron) 0.25 MG tablet Take 0.25 mg by mouth 2 (two) times daily as needed for anxiety.  08/17/12   [provider]  aspirin EC 81 MG tablet Take 162 mg by mouth daily.    [provider]  atorvastatin (LIPITOR) 20 MG tablet Take 20 mg by mouth daily.    [provider]  Cyanocobalamin (B-12 PO) Take 1 capsule by mouth daily.    [provider]  desonide (DESOWEN) 0.05 % cream Apply 1 application topically 2 (two) times daily as needed (dry skin/irritation).     [provider]  diclofenac sodium (VOLTAREN) 1 % GEL Apply 2 g topically 4 (four) times daily. Patient taking differently: Apply 2 g topically daily as needed (pain).  04/22/12   Prueter, Santiago Glad, PA-C  dicyclomine (BENTYL) 20 MG tablet TAKE 1 TABLET(20 MG) BY MOUTH THREE TIMES DAILY AFTER MEALS Patient taking differently: Take 20 mg by mouth  2 (two) times daily.  05/19/17   Mauri Pole, MD  diphenhydrAMINE (BENADRYL) 25 MG tablet Take 25 mg by mouth daily as needed for itching.    [provider]  DULoxetine (CYMBALTA) 60 MG capsule Take 60 mg by mouth daily.    [provider]  ferrous sulfate 325 (65 FE) MG tablet Take 325 mg by mouth daily.    [provider]  fluticasone (FLONASE) 50 MCG/ACT nasal spray Place 1 spray into both nostrils daily as needed for allergies or rhinitis.    [provider]  glipiZIDE (GLUCOTROL XL) 10 MG 24 hr tablet Take 10 mg by mouth daily.    [provider]  HYDROcodone-acetaminophen (NORCO)  7.5-325 MG per tablet Take 1 tablet by mouth daily as needed for severe pain.  05/17/14   [provider]  latanoprost (XALATAN) 0.005 % ophthalmic solution Place 1 drop into both eyes at bedtime.  12/05/11   [provider]  losartan-hydrochlorothiazide (HYZAAR) 100-12.5 MG tablet Take 1 tablet by mouth daily. 07/02/15   [provider]  metFORMIN (GLUCOPHAGE) 1000 MG tablet Take 1,000 mg by mouth 2 (two) times daily with a meal.     [provider]  metoprolol tartrate (LOPRESSOR) 50 MG tablet Take 50 mg by mouth 2 (two) times daily.    [provider]  Mouthwashes (BIOTENE/CALCIUM MT) Take 1 Dose by mouth daily as needed (dry mouth).     [provider]  omeprazole (PRILOSEC) 40 MG capsule TAKE 1 CAPSULE(40 MG) BY MOUTH DAILY Patient taking differently: Take 40 mg by mouth daily.  04/28/17   Mauri Pole, MD  pregabalin (LYRICA) 50 MG capsule Take 50 mg by mouth 2 (two) times daily.    [provider]  psyllium (METAMUCIL SMOOTH TEXTURE) 28 % packet Take 1 packet by mouth daily as needed (constipation).     [provider]  saccharomyces boulardii (FLORASTOR) 250 MG capsule Take 1 capsule (250 mg total) by mouth daily. 07/27/18   Mauri Pole, MD  Vitamin D, Ergocalciferol, (DRISDOL) 1.25 MG (50000 UNIT) CAPS capsule Take 50,000 Units by mouth every Wednesday.    [provider]      Allergies    Codeine, Fosamax [alendronate sodium], and Nsaids    Review of Systems   Review of Systems  Unable to perform ROS: Mental status change    Physical Exam Updated Vital Signs BP (!) 148/91   Pulse (!) 32   Resp 17   SpO2 100%  -erroneous pulse rate, 70bpm Physical Exam Vitals and nursing note reviewed.  Constitutional:      General: She is not in acute distress.    Appearance: Normal appearance. She is obese. She is not ill-appearing or toxic-appearing.  HENT:     Head: Normocephalic and atraumatic.      Mouth/Throat:     Mouth: Mucous membranes are dry.     Pharynx: Oropharynx is clear.  Eyes:     General: No scleral icterus.    Extraocular Movements: Extraocular movements intact.     Pupils: Pupils are equal, round, and reactive to light.  Cardiovascular:     Rate and Rhythm: Normal rate. Rhythm irregular.     Pulses: Normal pulses.     Heart sounds: No murmur heard. Pulmonary:     Effort: Pulmonary effort is normal. No respiratory distress.     Breath sounds: Normal breath sounds.  Abdominal:     General: Bowel sounds are normal.  There is no distension.     Palpations: Abdomen is soft.     Tenderness: There is no abdominal tenderness.  Musculoskeletal:     Cervical back: Neck supple.  Skin:    General: Skin is warm and dry.     Capillary Refill: Capillary refill takes less than 2 seconds.  Neurological:     Mental Status: She is alert and oriented to person, place, and time.     Cranial Nerves: Cranial nerves 2-12 are intact.     Sensory: Sensation is intact.     Motor: Weakness present. No tremor or pronator drift.     Comments: 3/5 strength to the right lower extremity, 5/5 strength to the left lower extremity  Psychiatric:        Mood and Affect: Mood normal.        Behavior: Behavior normal.        Thought Content: Thought content normal.        Judgment: Judgment normal.     ED Results / Procedures / Treatments   Labs (all labs ordered are listed, but only abnormal results are displayed) Labs Reviewed  COMPREHENSIVE METABOLIC PANEL - Abnormal; Notable for the following components:      Result Value   Glucose, Bld 124 (*)    Creatinine, Ser 1.02 (*)    Total Protein 6.1 (*)    Albumin 3.2 (*)    GFR, Estimated 57 (*)    All other components within normal limits  CBC WITH DIFFERENTIAL/PLATELET - Abnormal; Notable for the following components:   Hemoglobin 11.4 (*)    All other components within normal limits  BRAIN NATRIURETIC PEPTIDE - Abnormal; Notable  for the following components:   B Natriuretic Peptide 488.3 (*)    All other components within normal limits  CBG MONITORING, ED - Abnormal; Notable for the following components:   Glucose-Capillary 110 (*)    All other components within normal limits  URINALYSIS, ROUTINE W REFLEX MICROSCOPIC  RAPID URINE DRUG SCREEN, HOSP PERFORMED  PROTIME-INR  APTT  TROPONIN I (HIGH SENSITIVITY)  TROPONIN I (HIGH SENSITIVITY)   EKG EKG Interpretation  Date/Time:  Sunday October 06 2021 15:27:18 EDT Ventricular Rate:  69 PR Interval:    QRS Duration: 82 QT Interval:  403 QTC Calculation: 432 R Axis:   63 Text Interpretation: Atrial fibrillation Abnormal R-wave progression, early transition Borderline repolarization abnormality Otherwise no significant change Confirmed by Deno Etienne [phone]) on 10/06/2021 3:38:01 PM  Radiology CT Head Wo Contrast  Result Date: 10/06/2021 CLINICAL DATA:  Altered mental status. EXAM: CT HEAD WITHOUT CONTRAST TECHNIQUE: Contiguous axial images were obtained from the base of the skull through the vertex without intravenous contrast. RADIATION DOSE REDUCTION: This exam was performed according to the departmental dose-optimization program which includes automated exposure control, adjustment of the mA and/or kV according to patient size and/or use of iterative reconstruction technique. COMPARISON:  02/08/2012 FINDINGS: Brain: No evidence of intracranial hemorrhage, acute infarction, hydrocephalus, extra-axial collection, or mass lesion/mass effect. Mild cerebral atrophy and moderate chronic small vessel disease shows progression since prior exam. Vascular:  No hyperdense vessel or other acute findings. Skull: No evidence of fracture or other significant bone abnormality. Sinuses/Orbits:  No acute findings. Other: None. IMPRESSION: No acute intracranial abnormality. Cerebral atrophy and chronic small vessel disease. Electronically Signed   By: Marlaine Hind M.D.   On: 10/06/2021  17:24    Procedures Procedures   Medications Ordered in ED Medications  aspirin chewable tablet 324  mg (has no administration in time range)  LORazepam (ATIVAN) injection 0.5 mg (0.5 mg Intravenous Given 10/06/21 1733)    ED Course/ Medical Decision Making/ A&P Clinical Course as of 10/06/21 1957  Sun Oct 06, 2021  1630 Consulted and spoke with Dr. Cheral Marker, neurology who states patient will need TIA work up and will evaluate patient.  [LA]    Clinical Course User Index [LA] Mickie Hillier, PA-C                           Medical Decision Making Amount and/or Complexity of Data Reviewed Labs: ordered. Radiology: ordered.  Risk OTC drugs. Prescription drug management. Decision regarding hospitalization.  This patient presents to the ED with chief complaint(s) of speech change with pertinent past medical history of hypertension, dyslipidemia, obstructive sleep apnea which further complicates the presenting complaint. The complaint involves an extensive differential diagnosis and treatment options and also carries with it a high risk of complications and morbidity.    The differential diagnosis includes TIA, stroke, hyper/hypoglycemia, toxidrome, seizure   Additional history obtained: Additional history obtained from family Records reviewed Care Everywhere/External Records and Primary Care Documents  ED Course: Lab Tests: I Ordered, and personally interpreted labs.  The pertinent results include:   Glucose 110 Troponin x1 negative  Remaining workup pending at admission  Imaging Studies: I ordered and independently visualized and interpreted the following imaging CT scan head and MRI brain   which showed multiple small acute infarcts The interpretation of the imaging was limited to assessing for emergent pathology, for which purpose it was ordered. Cardiac Monitoring: The patient was maintained on a cardiac monitor.  I personally viewed and interpreted the cardiac monitor  which showed an underlying rhythm of:  Atrial Fibrillation Medicines ordered and prescription drug management: I ordered the following medications ASA for stroke  I considered this additional medications: anticoagulation (will hold per neurology) Reevaluation of the patient after these medicines showed that the patient    stayed the same  Reassessment and review: 76 year old female who presents to the emergency department with nonsensical speech at home which has resolved on initial presentation to the ED.  Physical exam with non-focal neurological assessment. She has RLE weakness, but daughter states this is baseline for patient. Likely osteoporosis related.  Concern after exam is for TIA vs stroke. I initially called Dr. Cheral Marker with neurology who recommends a TIA work up which was initiated.  Noted on physical exam to have atrial fibrillation. Patient denies having history and not anticoagulated so obtaining formal EKG.   Initial CT head is negative for acute findings, so obtaining MR Brain. Initial labs with no leukocytosis. No severe electrolyte derangement, not hypoglycemic.  UA pending but daughter states she may have had stronger smelling urine this week.  On reassessment patient with same mental status, no recurrent nonsensical speech or slurred speech. MR shows multiple acute infarcts. I was called by radiology regarding critical findings.  I re-engaged with neurology, Dr. Rory Percy, who will come evaluate the patient. Request ASA '324mg'$ , hospitalist admission. Also, recommends holding on anticoagulation for atrial fibrillation until she is admitted.   I have relayed results to patient and daughter. Discussed that she will be admitted for ongoing workup. They both verbalize understanding.  Spoke with Cyd Silence, MD hospitalist who agrees to admit patient.   Consultations Obtained: I requested consultation with the admitting physician Dr. Cyd Silence hospitalist, radiologist MRA, and  consultant Dr. Rory Percy neurology , and  discussed  findings as well as pertinent plan - they recommend: admission   Complexity of problems addressed: Patient's presentation is most consistent with  acute presentation with potential threat to life or bodily function During patient's assessment  Disposition: After consideration of the diagnostic results and the patient's response to treatment,  I feel that the patent would benefit from admission hospitalist .  Social Determinants of Health: Patient's  low health literacy    increases the complexity of managing their presentation  Final Clinical Impression(s) / ED Diagnoses Final diagnoses:  Cerebrovascular accident (CVA), unspecified mechanism [facility]    Rx / DC Orders ED Discharge Orders     None         Mickie Hillier, PA-C 10/06/21 [place], [place], DO 10/06/21 2024

## 2021-10-06 NOTE — Assessment & Plan Note (Signed)
.   Patient been placed on Accu-Cheks before every meal and nightly with sliding scale insulin . Holding home regimen of oral hypoglycemics . Hemoglobin A1C ordered . Diabetic Diet once swallow screen is passed

## 2021-10-06 NOTE — Assessment & Plan Note (Signed)
   Multiple areas of acute/subacute infarction on MRI brain are suggestive of a cardioembolic etiology especially in the setting of new diagnosis of atrial fibrillation on admission EKG  Performing serial neurologic checks  Monitoring patient on telemetry  Initiating antiplatelet therapy including Aspirin '81mg'$  Qdaily for now.  Eventually, patient will need to be transition to full dose anticoagulation in the setting atrial fibrillation  Patient's usual regimen of statin therapy will be titrated further if LDL is greater than 70  Further imaging to include: CT angiogram of the neck  Obtaining hemoglobin A1c and lipid panel in the morning  Echocardiogram in the morning with bubble study  PT, OT, SLP evaluation  Permissive hypertension with as needed antihypertensives only to be given if blood pressure greater than 220/115  Neurology following in consultation, their assistance is appreciated.

## 2021-10-06 NOTE — Assessment & Plan Note (Signed)
Continuing home regimen of daily PPI therapy.  

## 2021-10-06 NOTE — ED Triage Notes (Signed)
Pt coming from home via [facility]. Pt family noticed at 32 that she was not making sense and confused. Pt is usually A/O x 4 and is now only oriented to self. Pt has hx UTIS.   BP 157/86 CBG 168

## 2021-10-06 NOTE — Assessment & Plan Note (Signed)
   Permissive hypertension for now  Holding home regimen of oral antihypertensives with the exception of metoprolol considering new diagnosis of atrial fibrillation  As needed intravenous intermittent is for markedly elevated blood pressure of greater than 220/115

## 2021-10-06 NOTE — Assessment & Plan Note (Signed)
   New diagnosis and likely a primary cause of the patient's multiple strokes  Currently rate controlled although patient is chronically on metoprolol 50 mg twice daily as an outpatient which will be continued at this time  Holding off on initiating full dose anticoagulation until stroke work-up is complete and patient is cleared by neurology to proceed with initiation  Monitoring patient on telemetry  Echocardiogram ordered for the morning  If atrial fibrillation proves difficult to manage we will consider cardiology consultation

## 2021-10-06 NOTE — Consult Note (Signed)
Neurology Consultation  Reason for Consult: Slurred speech, MRI with acute strokes Referring Physician: Dr. Cyd Silence  CC: Slurred speech  History is obtained from: Patient, daughter  HPI: Jade Ellison is a 76 y.o. female past medical history of SVT but currently EKG showing atrial fibrillation-not on anticoagulation, coronary artery disease, hypertension, sleep apnea, type 2 diabetes, presented to the emergency room with complaints of garbled speech and difficulty talking.  She is now back to baseline per the daughter was at bedside.  She said that her last known normal was somewhere around 12:30 PM and around 1 PM it was noted that her speech was not making sense.  She was evaluated in the emergency room, symptoms had resolved.  She also has a history of UTIs.  Further work-up was initiated but a code stroke was not activated because of resolution of symptoms. MRI was obtained which showed multiple infarcts as described below. Neurology was consulted for further work-up and recommendations.  In addition to the above complaints, the daughter also reports that starting mid August, the patient started having significant difficulty ambulating and requires help of 2 family members or a wheelchair to even ambulate to the bathroom.  She was before that ambulatory to the bathroom with minimal support and no support.  Denies any back pain.  Denies any falls.  LKW: 12:30 PM IV thrombolysis given?: no, resolved symptoms Premorbid modified Rankin scale (mRS): 3-4  ROS: Full ROS was performed and is negative except as noted in the HPI.  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 pylori)    Hemorrhoids    Hernia of unspecified site of abdominal cavity without mention of obstruction or gangrene    History of blood transfusion    History of kidney stones    Hypertension 09 07 2013   TRANSTHORACIC ECHO STUDY CONCLUSIONS    Hypertension 09 07 2013   EJECTION FRACTION- 55%-60% .WALL MOTION WAS NORMAL   Impaired mobility    Iron deficiency anemia    Morbid obesity (HCC)    Neuropathy    Obesity    Olecranon bursitis of left elbow    OSA (obstructive sleep apnea)    On cpap   Osteoarthritis    right hip   Osteoporosis    Primary osteoarthritis    Bilaterally (knee)   PSVT (paroxysmal supraventricular tachycardia) (HCC)    PSVT (paroxysmal supraventricular tachycardia) (HCC)    Short-term memory loss    Shortness of breath dyspnea    Sleep apnea    on CPAP- does not use machine   Spinal headache 1986   Type 2 diabetes mellitus ([place])      Family History  Problem Relation Age of Onset   Heart disease Mother    Heart disease Father    Diabetes Brother    Diabetes Sister    Kidney cancer Brother    Colon cancer Neg Hx      Social  History:   reports that she has never smoked. She has never used smokeless tobacco. She reports current alcohol use of about 1.0 standard drink of alcohol per week. She reports that she does not use drugs.  Medications  Current Facility-Administered Medications:    aspirin chewable tablet 324 mg, 324 mg, Oral, Once, Mickie Hillier, PA-C  Current Outpatient Medications:    acetaminophen (TYLENOL) 325 MG tablet, Take 650 mg by mouth every 6 (six) hours as needed for moderate pain or headache., Disp: , Rfl:    albuterol (VENTOLIN HFA) 108 (90 Base) MCG/ACT inhaler, Inhale 2 puffs into the lungs every 4 (four) hours as needed for wheezing or shortness of breath., Disp: , Rfl:    ALPRAZolam (XANAX) 0.25 MG tablet, Take 0.25 mg by mouth  2 (two) times daily as needed for anxiety. , Disp: , Rfl:    aspirin EC 81 MG tablet, Take 162 mg by mouth daily., Disp: , Rfl:    atorvastatin (LIPITOR) 20 MG tablet, Take 20 mg by mouth daily., Disp: , Rfl:    Cyanocobalamin (B-12 PO), Take 1 capsule by mouth daily., Disp: , Rfl:    desonide (DESOWEN) 0.05 % cream, Apply 1 application topically 2 (two) times daily as needed (dry skin/irritation). , Disp: , Rfl:    diclofenac sodium (VOLTAREN) 1 % GEL, Apply 2 g topically 4 (four) times daily. (Patient taking differently: Apply 2 g topically daily as needed (pain). ), Disp: 2 Tube, Rfl: 2   dicyclomine (BENTYL) 20 MG tablet, TAKE 1 TABLET(20 MG) BY MOUTH THREE TIMES DAILY AFTER MEALS (Patient taking differently: Take 20 mg by mouth 2 (two) times daily. ), Disp: 90 tablet, Rfl: 0   diphenhydrAMINE (BENADRYL) 25 MG tablet, Take 25 mg by mouth daily as needed for itching., Disp: , Rfl:    DULoxetine (CYMBALTA) 60 MG capsule, Take 60 mg by mouth daily., Disp: , Rfl:    ferrous sulfate 325 (65 FE) MG tablet, Take 325 mg by mouth daily., Disp: , Rfl:    fluticasone (FLONASE) 50 MCG/ACT nasal spray, Place 1 spray into both nostrils daily as needed for allergies or rhinitis., Disp: , Rfl:    glipiZIDE (GLUCOTROL XL) 10 MG 24 hr tablet, Take 10 mg by mouth daily., Disp: , Rfl:    HYDROcodone-acetaminophen (NORCO) 7.5-325 MG per tablet, Take 1 tablet by mouth daily as needed for severe pain. , Disp: , Rfl: 0   latanoprost (XALATAN) 0.005 % ophthalmic solution, Place 1 drop into both eyes at bedtime. , Disp: , Rfl:    losartan-hydrochlorothiazide (HYZAAR) 100-12.5 MG tablet, Take 1 tablet by mouth daily., Disp: , Rfl: 11   metFORMIN (GLUCOPHAGE) 1000 MG tablet, Take 1,000 mg by mouth 2 (two) times daily with a meal. , Disp: , Rfl:    metoprolol tartrate (LOPRESSOR) 50 MG tablet, Take 50 mg by mouth 2 (two) times daily., Disp: , Rfl:    Mouthwashes (BIOTENE/CALCIUM MT), Take 1 Dose by mouth daily as needed  (dry mouth). , Disp: , Rfl:    omeprazole (PRILOSEC) 40 MG capsule, TAKE 1 CAPSULE(40 MG) BY MOUTH DAILY (Patient taking differently: Take 40 mg by mouth daily. ), Disp: 90 capsule, Rfl: 0   pregabalin (LYRICA) 50 MG capsule, Take 50 mg by mouth 2 (two) times daily., Disp: , Rfl:    psyllium (METAMUCIL SMOOTH TEXTURE) 28 % packet, Take 1 packet by mouth daily as needed (constipation). , Disp: , Rfl:    saccharomyces boulardii (FLORASTOR) 250 MG  capsule, Take 1 capsule (250 mg total) by mouth daily., Disp: 30 capsule, Rfl: 0   Vitamin D, Ergocalciferol, (DRISDOL) 1.25 MG (50000 UNIT) CAPS capsule, Take 50,000 Units by mouth every Wednesday., Disp: , Rfl:    Exam: Current vital signs: BP (!) 148/91   Pulse (!) 32   Resp 17   SpO2 100%  Vital signs in last 24 hours: Pulse Rate:  [32-71] 32 (09/24 1735) Resp:  [17-26] 17 (09/24 1735) BP: (136-170)/(82-120) 148/91 (09/24 1735) SpO2:  [96 %-100 %] 100 % (09/24 1735) Weight:  [128.4 kg] 128.4 kg (09/24 1720) General: Awake alert in no distress HEENT: Normocephalic atraumatic Lungs: Clear Cardiovascular regular rhythm Abdomen nondistended nontender Extremities with trace edema Neurologic exam Awake alert oriented x3 Speech is mildly dysarthric although the family feels that she is back to baseline. No evidence of aphasia Cranial nerves II to XII intact Motor examination with no drift in the upper extremities.  No drift in the left lower extremity.  Right lower extremity exam limited by pain and is barely 2/5 Sensory: intact Coordination: no dysmetria NIHSS 1a Level of Conscious.: 0 1b LOC Questions: 0 1c LOC Commands: 0 2 Best Gaze: 0 3 Visual: 0 4 Facial Palsy: 0 5a Motor Arm - left: 0 5b Motor Arm - Right: 0 6a Motor Leg - Left: 0 6b Motor Leg - Right: 2 7 Limb Ataxia: 0 8 Sensory: 0 9 Best Language: 0 10 Dysarthria: 1 11 Extinct. and Inatten.: 0. TOTAL: 3   Labs I have reviewed labs in epic and the results pertinent  to this consultation are:  CBC    Component Value Date/Time   WBC 6.1 10/06/2021 1551   RBC 4.34 10/06/2021 1551   HGB 11.4 (L) 10/06/2021 1551   HGB 13.7 12/21/2012 1536   HCT 36.7 10/06/2021 1551   HCT 42.6 12/21/2012 1536   PLT 181 10/06/2021 1551   PLT 164 12/21/2012 1536   MCV 84.6 10/06/2021 1551   MCV 84.7 12/21/2012 1536   MCH 26.3 10/06/2021 1551   MCHC 31.1 10/06/2021 1551   RDW 14.8 10/06/2021 1551   RDW 14.1 12/21/2012 1536   LYMPHSABS 1.0 10/06/2021 1551   LYMPHSABS 1.2 12/21/2012 1536   MONOABS 0.6 10/06/2021 1551   MONOABS 0.6 12/21/2012 1536   EOSABS 0.2 10/06/2021 1551   EOSABS 0.1 12/21/2012 1536   BASOSABS 0.0 10/06/2021 1551   BASOSABS 0.0 12/21/2012 1536    CMP     Component Value Date/Time   NA 136 10/06/2021 1551   NA 139 10/13/2016 1405   NA 139 12/21/2012 1536   K 3.7 10/06/2021 1551   K 4.6 12/21/2012 1536   CL 99 10/06/2021 1551   CO2 26 10/06/2021 1551   CO2 18 (L) 12/21/2012 1536   GLUCOSE 124 (H) 10/06/2021 1551   GLUCOSE 105 12/21/2012 1536   BUN 14 10/06/2021 1551   BUN 25 10/13/2016 1405   BUN 25.9 12/21/2012 1536   CREATININE 1.02 (H) 10/06/2021 1551   CREATININE 1.2 (H) 12/21/2012 1536   CALCIUM 8.9 10/06/2021 1551   CALCIUM 9.8 12/21/2012 1536   PROT 6.1 (L) 10/06/2021 1551   PROT 6.8 10/13/2016 1405   PROT 7.6 12/21/2012 1536   ALBUMIN 3.2 (L) 10/06/2021 1551   ALBUMIN 4.2 10/13/2016 1405   ALBUMIN 4.0 12/21/2012 1536   AST 21 10/06/2021 1551   AST 17 12/21/2012 1536   ALT 11 10/06/2021 1551   ALT 14 12/21/2012 1536   ALKPHOS 63 10/06/2021  8657   QIONGEX 52 12/21/2012 1536   BILITOT 0.9 10/06/2021 1551   BILITOT 0.8 10/13/2016 1405   BILITOT 1.11 12/21/2012 1536   GFRNONAA 57 (L) 10/06/2021 1551   GFRAA 54 (L) 10/13/2016 1405    Lipid Panel  No results found for: "CHOL", "TRIG", "HDL", "CHOLHDL", "VLDL", "LDLCALC", "LDLDIRECT"   Imaging I have reviewed the images obtained: MRI of the head shows  acute/subacute cortical nonhemorrhagic infarctions involving the right frontal operculum, punctate nonhemorrhagic infarct in the splenium of the corpus callosum raising concern for a central embolic etiology.  Moderate generalized atrophy and diffuse white matter disease likely reflecting sequela of chronic microvascular ischemia.  Assessment:  76 year old past history of SVT but current EKG showing atrial fibrillation not on anticoagulation, coronary artery disease, hypertension, sleep apnea and diabetes presenting with complaints of garbled speech and difficulty talking that was transient.  Now symptom-free. MRI with nonhemorrhagic infarctions involving the corpus callosum as well as the right frontal operculum and cortex raising concern for a cardioembolic etiology. Needs further work-up.  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  Discussed with Dr. Inda Merlin, who is admitting  -- Amie Portland, MD Neurologist [facility] Pager: [phone]

## 2021-10-06 NOTE — Assessment & Plan Note (Signed)
.   Continuing home regimen of lipid lowering therapy. . We will titrate upwards if LDL is greater than 70

## 2021-10-06 NOTE — Assessment & Plan Note (Signed)
   Longstanding history of sleep apnea however patient has never been compliant with CPAP therapy  Patient reports that she just had a repeat sleep study and is to undergo CPAP fitting in the next several days  We will monitor pulse ox while sleeping, provided with supplemental oxygen as necessary

## 2021-10-06 NOTE — H&P (Addendum)
History and Physical    Patient: Jade Ellison MRN: [id number] DOA: 10/06/2021  Date of Service: the patient was seen and examined on 10/06/2021  Patient coming from: Home  Chief Complaint:  Chief Complaint  Patient presents with   Altered Mental Status    HPI:   76 year old female with past medical history of non-insulin-dependent diabetes mellitus type 2, hypertension, obstructive sleep apnea (CPAP titration study pending), hyperlipidemia, gastroesophageal reflux disease, recurrent urinary tract infections (on supressive therapy with Keflex), diastolic congestive heart failure (Echo 09/2011 EF 55-60% with G1DD) presenting to [facility] emergency department with sudden changes in speech.  History is been obtained from the patient as well as the daughter and son at the bedside.  Patient has been experiencing gradually increasing weakness and unsteady gait since approximately mid August.  Patient and family explained that in sudden stepwise fashion beginning with sudden right lower extremity weakness in mid August prompting her to begin to intermittently use a wheelchair to get around her home.  In the days and weeks that followed patient would have stepwise worsening in her symptoms including a markedly unsteady gait making it almost impossible for her to ever ambulate.  The symptoms continue to persist and this afternoon the daughter reports that the patient suddenly was unable to speak.  After short while patient was finally able to speak but the words that she was using were nonsensical.  Patient and family deny any associated facial droop, change in vision, headache.    Due to these ongoing symptoms EMS was contacted and patient was promptly evaluated and brought into [facility] emergency room for evaluation.  Initial noncontrast CT pending of the brain was unremarkable however follow-up MRI of the brain revealed multiple acute to subacute areas of infarction involving  the right frontal operculum as well as the splenium of the corpus callosum with concerns for embolic etiology.  EDP discussed these findings with Dr. Cheral Marker with neurology who recommended hospitalization for stroke work-up with Dr. Rory Percy to evaluate in consultation later in the evening.  The hospitalist group was then called to assess the patient for admission to the hospital.  Review of Systems: Review of Systems  Neurological:  Positive for speech change and focal weakness.  All other systems reviewed and are negative.    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 pylori)    Hemorrhoids    Hernia of unspecified site of abdominal cavity without mention of obstruction or gangrene    History of blood transfusion    History of kidney stones    Hypertension 09 07 2013   TRANSTHORACIC ECHO STUDY CONCLUSIONS    Hypertension 09 07 2013   EJECTION FRACTION- 55%-60% .WALL MOTION WAS NORMAL   Impaired mobility    Iron deficiency anemia    Morbid obesity (HCC)    Neuropathy  Obesity    Olecranon bursitis of left elbow    OSA (obstructive sleep apnea)    On cpap   Osteoarthritis    right hip   Osteoporosis    Primary osteoarthritis    Bilaterally (knee)   PSVT (paroxysmal supraventricular tachycardia) (HCC)    PSVT (paroxysmal supraventricular tachycardia) (HCC)    Short-term memory loss    Shortness of breath dyspnea    Sleep apnea    on CPAP- does not use machine   Spinal headache 1986   Type 2  diabetes mellitus ([place])     Past Surgical History:  Procedure Laterality Date   BIOPSY  11/07/2019   Procedure: BIOPSY;  Surgeon: Mauri Pole, MD;  Location: [facility] ENDOSCOPY;  Service: Endoscopy;;   CARDIAC CATHETERIZATION  2003    normal coronary arteries and nomal LV function by cath performed by Dr.Peter Starbuck WITH PROPOFOL N/A 11/07/2019   Procedure: COLONOSCOPY WITH PROPOFOL;  Surgeon: Mauri Pole, MD;  Location: [facility] ENDOSCOPY;  Service: Endoscopy;  Laterality: N/A;   ENTEROSCOPY  02/05/2012   Procedure: ENTEROSCOPY;  Surgeon: Lafayette Dragon, MD;  Location: [facility] ENDOSCOPY;  Service: Endoscopy;  Laterality: N/A;   ESOPHAGOGASTRODUODENOSCOPY N/A 04/19/2014   Procedure: ESOPHAGOGASTRODUODENOSCOPY (EGD);  Surgeon: Lafayette Dragon, MD;  Location: [facility];  Service: Endoscopy;  Laterality: N/A;   ESOPHAGOGASTRODUODENOSCOPY (EGD) WITH PROPOFOL N/A 08/01/2015   Procedure: ESOPHAGOGASTRODUODENOSCOPY (EGD) WITH PROPOFOL;  Surgeon: Mauri Pole, MD;  Location: [facility]OSCOPY;  Service: Endoscopy;  Laterality: N/A;   ESOPHAGOGASTRODUODENOSCOPY (EGD) WITH PROPOFOL N/A 11/07/2019   Procedure: ESOPHAGOGASTRODUODENOSCOPY (EGD) WITH PROPOFOL;  Surgeon: Mauri Pole, MD;  Location: [facility] ENDOSCOPY;  Service: Endoscopy;  Laterality: N/A;   HERNIA REPAIR     HOT HEMOSTASIS  02/05/2012   Procedure: HOT HEMOSTASIS (ARGON PLASMA COAGULATION/BICAP);  Surgeon: Lafayette Dragon, MD;  Location: [facility];  Service: Endoscopy;  Laterality: N/A;   KNEE ARTHROSCOPY Left    left sided lithotripsy     POLYPECTOMY  11/07/2019   Procedure: POLYPECTOMY;  Surgeon: Mauri Pole, MD;  Location: [facility];  Service: Endoscopy;;   TUBAL LIGATION      Social History:  reports that she has never smoked. She has never used smokeless tobacco. She reports current alcohol use of about 1.0 standard drink of alcohol per week. She reports that  she does not use drugs.  Allergies  Allergen Reactions   Codeine Rash   Fosamax [Alendronate Sodium]     Upset stomach    Nsaids     Abdominal Pain    Family History  Problem Relation Age of Onset   Heart disease Mother    Heart disease Father    Diabetes Brother    Diabetes Sister    Kidney cancer Brother    Colon cancer Neg Hx     Prior to Admission medications   Medication Sig Start Date End Date Taking? Authorizing Provider  acetaminophen (TYLENOL) 325 MG tablet Take 650 mg by mouth every 6 (six) hours as needed for moderate pain or headache.    [provider]  albuterol (VENTOLIN HFA) 108 (90 Base) MCG/ACT inhaler Inhale 2 puffs into the lungs every 4 (four) hours as needed for wheezing or shortness of breath.    [provider]  ALPRAZolam Duanne Moron) 0.25 MG tablet Take 0.25 mg by mouth 2 (two) times daily as needed for anxiety.  08/17/12  [provider]  aspirin EC 81 MG tablet Take 162 mg by mouth daily.    [provider]  atorvastatin (LIPITOR) 20 MG tablet Take 20 mg by mouth daily.    [provider]  Cyanocobalamin (B-12 PO) Take 1 capsule by mouth daily.    [provider]  desonide (DESOWEN) 0.05 % cream Apply 1 application topically 2 (two) times daily as needed (dry skin/irritation).     [provider]  diclofenac sodium (VOLTAREN) 1 % GEL Apply 2 g topically 4 (four) times daily. Patient taking differently: Apply 2 g topically daily as needed (pain).  04/22/12   Prueter, Santiago Glad, PA-C  dicyclomine (BENTYL) 20 MG tablet TAKE 1 TABLET(20 MG) BY MOUTH THREE TIMES DAILY AFTER MEALS Patient taking differently: Take 20 mg by mouth 2 (two) times daily.  05/19/17   Mauri Pole, MD  diphenhydrAMINE (BENADRYL) 25 MG tablet Take 25 mg by mouth daily as needed for itching.    [provider]  DULoxetine (CYMBALTA) 60 MG capsule Take 60 mg by mouth daily.    [provider]  ferrous sulfate 325  (65 FE) MG tablet Take 325 mg by mouth daily.    [provider]  fluticasone (FLONASE) 50 MCG/ACT nasal spray Place 1 spray into both nostrils daily as needed for allergies or rhinitis.    [provider]  glipiZIDE (GLUCOTROL XL) 10 MG 24 hr tablet Take 10 mg by mouth daily.    [provider]  HYDROcodone-acetaminophen (NORCO) 7.5-325 MG per tablet Take 1 tablet by mouth daily as needed for severe pain.  05/17/14   [provider]  latanoprost (XALATAN) 0.005 % ophthalmic solution Place 1 drop into both eyes at bedtime.  12/05/11   [provider]  losartan-hydrochlorothiazide (HYZAAR) 100-12.5 MG tablet Take 1 tablet by mouth daily. 07/02/15   [provider]  metFORMIN (GLUCOPHAGE) 1000 MG tablet Take 1,000 mg by mouth 2 (two) times daily with a meal.     [provider]  metoprolol tartrate (LOPRESSOR) 50 MG tablet Take 50 mg by mouth 2 (two) times daily.    [provider]  Mouthwashes (BIOTENE/CALCIUM MT) Take 1 Dose by mouth daily as needed (dry mouth).     [provider]  omeprazole (PRILOSEC) 40 MG capsule TAKE 1 CAPSULE(40 MG) BY MOUTH DAILY Patient taking differently: Take 40 mg by mouth daily.  04/28/17   Mauri Pole, MD  pregabalin (LYRICA) 50 MG capsule Take 50 mg by mouth 2 (two) times daily.    [provider]  psyllium (METAMUCIL SMOOTH TEXTURE) 28 % packet Take 1 packet by mouth daily as needed (constipation).     [provider]  saccharomyces boulardii (FLORASTOR) 250 MG capsule Take 1 capsule (250 mg total) by mouth daily. 07/27/18   Mauri Pole, MD  Vitamin D, Ergocalciferol, (DRISDOL) 1.25 MG (50000 UNIT) CAPS capsule Take 50,000 Units by mouth every Wednesday.    [provider]    Physical Exam:  Vitals:   10/06/21 2015 10/06/21 2030 10/06/21 2045 10/06/21 2100  BP: (!) 138/97 (!) 155/109 (!) 145/109 (!) 109/95  Pulse: 71 70 77 82  Resp: '17 17  16   '$ SpO2: 96% 97% 97% 98%    Constitutional: Awake alert and oriented x3, no associated distress.   Skin: no rashes, no lesions, good skin turgor noted. Eyes: Pupils are equally reactive to light.  No evidence of scleral icterus or conjunctival pallor.  ENMT:  Dry mucous membranes noted.  Posterior pharynx clear of any exudate or lesions.   Neck: normal, supple, no masses, no thyromegaly.  No evidence of jugular venous distension.   Respiratory: clear to auscultation bilaterally, no wheezing, no crackles. Normal respiratory effort. No accessory muscle use.  Cardiovascular: Irregularly irregular rhythm with controlled rate.  no murmurs / rubs / gallops. No extremity edema. 2+ pedal pulses. No carotid bruits.  Chest:   Nontender without crepitus or deformity.   Back:   Nontender without crepitus or deformity. Abdomen: Abdomen is protuberant but soft and nontender.  No evidence of intra-abdominal masses.  Positive bowel sounds noted in all quadrants.   Musculoskeletal: No joint deformity upper and lower extremities. Good ROM, no contractures. Normal muscle tone.  Neurologic: Notable right lower extremity weakness with 4 out of 5 strength in both the proximal and distal muscle groups.  Additional mild weakness in the left lower extremity although less so.  Cerebellar function testing unremarkable.  CN 2-12 grossly intact. Sensation intact.    Patient is following all commands.  Patient is responsive to verbal stimuli.   Psychiatric: Patient exhibits normal mood with somewhat flat affect.  Patient seems to possess insight as to their current situation.    Data Reviewed:  I have personally reviewed and interpreted labs, imaging.  Significant findings are   MRI brain without contrast:  1. Acute/subacute cortical nonhemorrhagic infarct involving the right frontal operculum. 2. Acute subacute punctate nonhemorrhagic infarct in the splenium of the corpus callosum. 3. Question central embolic  etiology.    Lab Results  Component Value Date   WBC 6.1 10/06/2021   HGB 11.4 (L) 10/06/2021   HCT 36.7 10/06/2021   MCV 84.6 10/06/2021   PLT 181 10/06/2021   Lab Results  Component Value Date   K 3.7 10/06/2021   Lab Results  Component Value Date   BUN 14 10/06/2021   Lab Results  Component Value Date   CREATININE 1.02 (H) 10/06/2021     EKG: Personally reviewed.  Rhythm is atrial fibrillation with heart rate of 69 bpm.  No dynamic ST segment changes appreciated.  Assessment and Plan: * Acute ischemic stroke [facility]) Multiple areas of acute/subacute infarction on MRI brain are suggestive of a cardioembolic etiology especially in the setting of new diagnosis of atrial fibrillation on admission EKG Performing serial neurologic checks Monitoring patient on telemetry Initiating antiplatelet therapy including Aspirin '81mg'$  Qdaily for now.  Eventually, patient will need to be transition to full dose anticoagulation in the setting atrial fibrillation Patient's usual regimen of statin therapy will be titrated further if LDL is greater than 70 Further imaging to include: CT angiogram of the neck Obtaining hemoglobin A1c and lipid panel in the morning Echocardiogram in the morning with bubble study PT, OT, SLP evaluation Permissive hypertension with as needed antihypertensives only to be given if blood pressure greater than 220/115 Neurology following in consultation, their assistance is appreciated.   Paroxysmal atrial fibrillation (HCC) New diagnosis and likely a primary cause of the patient's multiple strokes Currently rate controlled although patient is chronically on metoprolol 50 mg twice daily as an outpatient which will be continued at this time Holding off on initiating full dose anticoagulation until stroke work-up is complete and patient is cleared by neurology to proceed with initiation Monitoring patient on telemetry Echocardiogram ordered for the morning If atrial  fibrillation proves difficult to manage we will consider cardiology consultation  Acute cystitis without hematuria Patient reports a longstanding history of  recurrent urinary tract infections on suppressive therapy with daily Keflex in the outpatient setting Urinalysis here suggestive of urinary tract infection Patient additionally complains of concurrent symptoms with ongoing dysuria and therefore will initiate on antibiotic therapy We will place on intravenous ceftriaxone for now, transition to oral antibiotic therapy when cultures available. Significant chance that this may be a multidrug-resistant organism as patient is already on suppressive therapy.  Chronic kidney disease, stage 3a (HCC) Strict intake and output monitoring Creatinine near baseline Minimizing nephrotoxic agents as much as possible Serial chemistries to monitor renal function and electrolytes   Type 2 diabetes mellitus with stage 3a chronic kidney disease ([place]) Patient been placed on Accu-Cheks before every meal and nightly with sliding scale insulin Holding home regimen of oral hypoglycemics Hemoglobin A1C ordered Diabetic Diet once swallow screen is passed  Mixed diabetic hyperlipidemia associated with type 2 diabetes mellitus ([place]) Continuing home regimen of lipid lowering therapy. We will titrate upwards if LDL is greater than 70   Essential hypertension Permissive hypertension for now Holding home regimen of oral antihypertensives with the exception of metoprolol considering new diagnosis of atrial fibrillation As needed intravenous intermittent is for markedly elevated blood pressure of greater than 220/115    OSA (obstructive sleep apnea) Longstanding history of sleep apnea however patient has never been compliant with CPAP therapy Patient reports that she just had a repeat sleep study and is to undergo CPAP fitting in the next several days We will monitor pulse ox while sleeping, provided with  supplemental oxygen as necessary  GERD (gastroesophageal reflux disease) Continuing home regimen of daily PPI therapy.         Code Status:  Full code  code status decision has been confirmed with: patient Family Communication: Daughter and son are at the bedside who have been updated on plan of care.  Consults: Dr. Rory Percy with Neurology  Severity of Illness:  The appropriate patient status for this patient is OBSERVATION. Observation status is judged to be reasonable and necessary in order to provide the required intensity of service to ensure the patient's safety. The patient's presenting symptoms, physical exam findings, and initial radiographic and laboratory data in the context of their medical condition is felt to place them at decreased risk for further clinical deterioration. Furthermore, it is anticipated that the patient will be medically stable for discharge from the hospital within 2 midnights of admission.   Author:  Vernelle Emerald MD  10/06/2021 10:51 PM

## 2021-10-07 ENCOUNTER — Observation Stay (HOSPITAL_COMMUNITY): Payer: Medicare HMO

## 2021-10-07 DIAGNOSIS — I6389 Other cerebral infarction: Secondary | ICD-10-CM | POA: Diagnosis not present

## 2021-10-07 DIAGNOSIS — Z993 Dependence on wheelchair: Secondary | ICD-10-CM | POA: Diagnosis not present

## 2021-10-07 DIAGNOSIS — F32A Depression, unspecified: Secondary | ICD-10-CM | POA: Diagnosis present

## 2021-10-07 DIAGNOSIS — I2510 Atherosclerotic heart disease of native coronary artery without angina pectoris: Secondary | ICD-10-CM | POA: Diagnosis present

## 2021-10-07 DIAGNOSIS — Z79899 Other long term (current) drug therapy: Secondary | ICD-10-CM | POA: Diagnosis not present

## 2021-10-07 DIAGNOSIS — R29701 NIHSS score 1: Secondary | ICD-10-CM | POA: Diagnosis present

## 2021-10-07 DIAGNOSIS — I6381 Other cerebral infarction due to occlusion or stenosis of small artery: Secondary | ICD-10-CM | POA: Diagnosis present

## 2021-10-07 DIAGNOSIS — I4891 Unspecified atrial fibrillation: Secondary | ICD-10-CM | POA: Diagnosis not present

## 2021-10-07 DIAGNOSIS — E1169 Type 2 diabetes mellitus with other specified complication: Secondary | ICD-10-CM | POA: Diagnosis present

## 2021-10-07 DIAGNOSIS — I480 Paroxysmal atrial fibrillation: Secondary | ICD-10-CM | POA: Diagnosis present

## 2021-10-07 DIAGNOSIS — N3000 Acute cystitis without hematuria: Secondary | ICD-10-CM | POA: Diagnosis present

## 2021-10-07 DIAGNOSIS — E782 Mixed hyperlipidemia: Secondary | ICD-10-CM | POA: Diagnosis present

## 2021-10-07 DIAGNOSIS — I5032 Chronic diastolic (congestive) heart failure: Secondary | ICD-10-CM | POA: Diagnosis present

## 2021-10-07 DIAGNOSIS — Z87440 Personal history of urinary (tract) infections: Secondary | ICD-10-CM | POA: Diagnosis not present

## 2021-10-07 DIAGNOSIS — N1831 Chronic kidney disease, stage 3a: Secondary | ICD-10-CM | POA: Diagnosis present

## 2021-10-07 DIAGNOSIS — E1122 Type 2 diabetes mellitus with diabetic chronic kidney disease: Secondary | ICD-10-CM | POA: Diagnosis present

## 2021-10-07 DIAGNOSIS — K219 Gastro-esophageal reflux disease without esophagitis: Secondary | ICD-10-CM | POA: Diagnosis present

## 2021-10-07 DIAGNOSIS — I639 Cerebral infarction, unspecified: Secondary | ICD-10-CM | POA: Diagnosis present

## 2021-10-07 DIAGNOSIS — I130 Hypertensive heart and chronic kidney disease with heart failure and stage 1 through stage 4 chronic kidney disease, or unspecified chronic kidney disease: Secondary | ICD-10-CM | POA: Diagnosis present

## 2021-10-07 DIAGNOSIS — R4701 Aphasia: Secondary | ICD-10-CM | POA: Diagnosis present

## 2021-10-07 DIAGNOSIS — Z888 Allergy status to other drugs, medicaments and biological substances status: Secondary | ICD-10-CM | POA: Diagnosis not present

## 2021-10-07 DIAGNOSIS — Z886 Allergy status to analgesic agent status: Secondary | ICD-10-CM | POA: Diagnosis not present

## 2021-10-07 DIAGNOSIS — Z91199 Patient's noncompliance with other medical treatment and regimen due to unspecified reason: Secondary | ICD-10-CM | POA: Diagnosis not present

## 2021-10-07 DIAGNOSIS — Z885 Allergy status to narcotic agent status: Secondary | ICD-10-CM | POA: Diagnosis not present

## 2021-10-07 DIAGNOSIS — Z6841 Body Mass Index (BMI) 40.0 and over, adult: Secondary | ICD-10-CM | POA: Diagnosis not present

## 2021-10-07 DIAGNOSIS — G4733 Obstructive sleep apnea (adult) (pediatric): Secondary | ICD-10-CM | POA: Diagnosis present

## 2021-10-07 LAB — CBC WITH DIFFERENTIAL/PLATELET
Abs Immature Granulocytes: 0.02 10*3/uL (ref 0.00–0.07)
Basophils Absolute: 0 10*3/uL (ref 0.0–0.1)
Basophils Relative: 0 %
Eosinophils Absolute: 0.2 10*3/uL (ref 0.0–0.5)
Eosinophils Relative: 3 %
HCT: 36.5 % (ref 36.0–46.0)
Hemoglobin: 11.2 g/dL — ABNORMAL LOW (ref 12.0–15.0)
Immature Granulocytes: 0 %
Lymphocytes Relative: 23 %
Lymphs Abs: 1.2 10*3/uL (ref 0.7–4.0)
MCH: 26 pg (ref 26.0–34.0)
MCHC: 30.7 g/dL (ref 30.0–36.0)
MCV: 84.9 fL (ref 80.0–100.0)
Monocytes Absolute: 0.6 10*3/uL (ref 0.1–1.0)
Monocytes Relative: 11 %
Neutro Abs: 3.2 10*3/uL (ref 1.7–7.7)
Neutrophils Relative %: 63 %
Platelets: 182 10*3/uL (ref 150–400)
RBC: 4.3 MIL/uL (ref 3.87–5.11)
RDW: 15 % (ref 11.5–15.5)
WBC: 5.2 10*3/uL (ref 4.0–10.5)
nRBC: 0 % (ref 0.0–0.2)

## 2021-10-07 LAB — COMPREHENSIVE METABOLIC PANEL
ALT: 12 U/L (ref 0–44)
AST: 18 U/L (ref 15–41)
Albumin: 3.1 g/dL — ABNORMAL LOW (ref 3.5–5.0)
Alkaline Phosphatase: 60 U/L (ref 38–126)
Anion gap: 11 (ref 5–15)
BUN: 13 mg/dL (ref 8–23)
CO2: 28 mmol/L (ref 22–32)
Calcium: 9.2 mg/dL (ref 8.9–10.3)
Chloride: 100 mmol/L (ref 98–111)
Creatinine, Ser: 0.99 mg/dL (ref 0.44–1.00)
GFR, Estimated: 59 mL/min — ABNORMAL LOW (ref >60)
Glucose, Bld: 128 mg/dL — ABNORMAL HIGH (ref 70–99)
Potassium: 3.5 mmol/L (ref 3.5–5.1)
Sodium: 139 mmol/L (ref 135–145)
Total Bilirubin: 0.8 mg/dL (ref 0.3–1.2)
Total Protein: 6.2 g/dL — ABNORMAL LOW (ref 6.5–8.1)

## 2021-10-07 LAB — CBG MONITORING, ED
Glucose-Capillary: 128 mg/dL — ABNORMAL HIGH (ref 70–99)
Glucose-Capillary: 237 mg/dL — ABNORMAL HIGH (ref 70–99)
Glucose-Capillary: 188 mg/dL — ABNORMAL HIGH (ref 70–99)

## 2021-10-07 LAB — LIPID PANEL
Cholesterol: 91 mg/dL (ref 0–200)
HDL: 44 mg/dL (ref >40)
LDL Cholesterol: 32 mg/dL (ref 0–99)
Total CHOL/HDL Ratio: 2.1 RATIO
Triglycerides: 77 mg/dL (ref <150)
VLDL: 15 mg/dL (ref 0–40)

## 2021-10-07 LAB — GLUCOSE, CAPILLARY: Glucose-Capillary: 209 mg/dL — ABNORMAL HIGH (ref 70–99)

## 2021-10-07 LAB — MAGNESIUM: Magnesium: 1.1 mg/dL — ABNORMAL LOW (ref 1.7–2.4)

## 2021-10-07 LAB — HEMOGLOBIN A1C
Hgb A1c MFr Bld: 7.2 % — ABNORMAL HIGH (ref 4.8–5.6)
Mean Plasma Glucose: 159.94 mg/dL

## 2021-10-07 LAB — ECHOCARDIOGRAM COMPLETE BUBBLE STUDY
Area-P 1/2: 3.48 cm2
S' Lateral: 3.7 cm

## 2021-10-07 MED ORDER — APIXABAN 5 MG PO TABS
5.0000 mg | ORAL_TABLET | Freq: Two times a day (BID) | ORAL | Status: DC
  Administered 2021-10-07 – 2021-10-08 (×2): 5 mg via ORAL
  Filled 2021-10-07 (×2): qty 1

## 2021-10-07 MED ORDER — MAGNESIUM SULFATE 4 GM/100ML IV SOLN
4.0000 g | Freq: Once | INTRAVENOUS | Status: AC
  Administered 2021-10-07: 4 g via INTRAVENOUS
  Filled 2021-10-07: qty 100

## 2021-10-07 MED ORDER — LATANOPROST 0.005 % OP SOLN
1.0000 [drp] | Freq: Every day | OPHTHALMIC | Status: DC
  Administered 2021-10-07: 1 [drp] via OPHTHALMIC
  Filled 2021-10-07: qty 2.5

## 2021-10-07 NOTE — Evaluation (Signed)
Physical Therapy Evaluation Patient Details Name: Jade Ellison MRN: [id number] DOB: 11/08/1945 Today's Date: 10/07/2021  History of Present Illness  Pt is a 76 y/o female who presented with speech difficulties and progressive mobility decline. 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 PMH: anxiety, CAD, Barrett esophagus, depression, DJD L knee, glaucoma, hernia, HTN, CHF, obesity, OA R hip, PSVT, DM2  Clinical Impression  Pt admitted secondary to problem above with deficits below. Pt was mod -max A +2 for bed mobility. Upon sitting, pt began to slide forward on higher stretcher along with demonstrating posterior lean, so unsafe to attempt further mobility. Pt very motivated to regain independence as she was previously able to ambulate with RW. Recommending AIR level therapies at d/c to address current deficits. Will continue to follow acutely.        Recommendations for follow up therapy are one component of a multi-disciplinary discharge planning process, led by the attending physician.  Recommendations may be updated based on patient status, additional functional criteria and insurance authorization.  Follow Up Recommendations Acute inpatient rehab (3hours/day)      Assistance Recommended at Discharge Frequent or constant Supervision/Assistance  Patient can return home with the following  Two people to help with walking and/or transfers;Two people to help with bathing/dressing/bathroom;Assistance with cooking/housework;Help with stairs or ramp for entrance;Assist for transportation    Equipment Recommendations Other (comment) (TBD)  Recommendations for Other Services       Functional Status Assessment Patient has had a recent decline in their functional status and demonstrates the ability to make significant improvements in function in a reasonable and predictable amount of time.     Precautions / Restrictions Precautions Precautions:  Fall Restrictions Weight Bearing Restrictions: No      Mobility  Bed Mobility Overal bed mobility: Needs Assistance Bed Mobility: Supine to Sit, Sit to Supine     Supine to sit: Mod assist, +2 for physical assistance, +2 for safety/equipment, HOB elevated Sit to supine: Max assist, +2 for safety/equipment, +2 for physical assistance   General bed mobility comments: Mod A x 2 to sit on edge of stretcher with posterior bias, assist needed to advance LEs (R >L) and lift trunk with handheld assist. pt required Max A x 2 to safely return to supine on stretcher    Transfers                   General transfer comment: unable to safely attempt from stretcher height    Ambulation/Gait                  Stairs            Wheelchair Mobility    Modified Rankin (Stroke Patients Only)       Balance Overall balance assessment: Needs assistance Sitting-balance support: No upper extremity supported, Feet supported, Bilateral upper extremity supported Sitting balance-Leahy Scale: Poor Sitting balance - Comments: posterior bias, reliant on BUE support Postural control: Posterior lean                                   Pertinent Vitals/Pain Pain Assessment Pain Assessment: No/denies pain    Home Living Family/patient expects to be discharged to:: Private residence Living Arrangements: Children Available Help at Discharge: Family Type of Home: House Home Access: Level entry       Home Layout: One level Home Equipment: Shower seat;Wheelchair -  manual;Rolling Walker (2 wheels) Additional Comments: After selling her home, pt moved in with daughter 6 weeks ago. still looking for her own home    Prior Function Prior Level of Function : Needs assist             Mobility Comments: for the past month, pt has been using w/c for mobility due to difficulty ambulating without assistance. prior to one month ago, pt was using RW to ambulate. Required  assist to get out of bed and for transfers. ADLs Comments: Daughter has been assisting some more recently for LB dressing, shower transfers but once in shower, able to bathe self.     Hand Dominance   Dominant Hand: Right    Extremity/Trunk Assessment   Upper Extremity Assessment Upper Extremity Assessment: Defer to OT evaluation    Lower Extremity Assessment Lower Extremity Assessment: RLE deficits/detail RLE Deficits / Details: Increased weakness noted. Difficulty performing SLR.    Cervical / Trunk Assessment Cervical / Trunk Assessment: Normal  Communication   Communication: No difficulties  Cognition Arousal/Alertness: Awake/alert Behavior During Therapy: WFL for tasks assessed/performed Overall Cognitive Status: No family/caregiver present to determine baseline cognitive functioning                                 General Comments: pleasant, follows directions consistently        General Comments      Exercises     Assessment/Plan    PT Assessment Patient needs continued PT services  PT Problem List Decreased strength;Decreased range of motion;Decreased activity tolerance;Decreased balance;Decreased mobility;Decreased knowledge of use of DME;Decreased knowledge of precautions       PT Treatment Interventions DME instruction;Gait training;Functional mobility training;Therapeutic activities;Therapeutic exercise;Balance training;Patient/family education    PT Goals (Current goals can be found in the Care Plan section)  Acute Rehab PT Goals Patient Stated Goal: to be independent PT Goal Formulation: With patient Time For Goal Achievement: 10/21/21 Potential to Achieve Goals: Good    Frequency Min 4X/week     Co-evaluation PT/OT/SLP Co-Evaluation/Treatment: Yes Reason for Co-Treatment: For patient/therapist safety;To address functional/ADL transfers PT goals addressed during session: Balance;Mobility/safety with mobility         AM-PAC  PT "6 Clicks" Mobility  Outcome Measure Help needed turning from your back to your side while in a flat bed without using bedrails?: A Lot Help needed moving from lying on your back to sitting on the side of a flat bed without using bedrails?: Total Help needed moving to and from a bed to a chair (including a wheelchair)?: Total Help needed standing up from a chair using your arms (e.g., wheelchair or bedside chair)?: Total Help needed to walk in hospital room?: Total Help needed climbing 3-5 steps with a railing? : Total 6 Click Score: 7    End of Session Equipment Utilized During Treatment: Gait belt Activity Tolerance: Patient tolerated treatment well Patient left: in bed;with call bell/phone within reach (on stretcher in ED) Nurse Communication: Mobility status PT Visit Diagnosis: Unsteadiness on feet (R26.81);Muscle weakness (generalized) (M62.81);Difficulty in walking, not elsewhere classified (R26.2)    Time: 1012-1026 PT Time Calculation (min) (ACUTE ONLY): 14 min   Charges:   PT Evaluation $PT Eval Moderate Complexity: 1 Mod          Reuel Derby, PT, DPT  Acute Rehabilitation Services  Office: [phone]   Rudean Hitt 10/07/2021, 2:55 PM

## 2021-10-07 NOTE — Evaluation (Addendum)
Occupational Therapy Evaluation Patient Details Name: Jade Ellison MRN: [id number] DOB: November 20, 1945 Today's Date: 10/07/2021   History of Present Illness Pt is a 76 y/o female who presented with speech difficulties and progressive mobility decline. 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 PMH: anxiety, CAD, Barrett esophagus, depression, DJD L knee, glaucoma, hernia, HTN, CHF, obesity, OA R hip, PSVT, DM2   Clinical Impression   PTA, pt lives with family. Prior to one month ago, pt was ambulatory with a walker and able to complete ADLs. However, in recent weeks, pt with difficulty ambulating, using w/c as primary means of mobility with increased assist needed for ADLs. Pt presents with deficits in overall strength (most notably R LE), endurance, and balance. Pt required Mod-Max A x 2 for bed mobility. Due to stretcher height and posterior bias sitting EOB, unable to safely attempt standing at this time. Based on noted decline from PLOF, feel pt will progress well with AIR level therapies prior to return home.      Recommendations for follow up therapy are one component of a multi-disciplinary discharge planning process, led by the attending physician.  Recommendations may be updated based on patient status, additional functional criteria and insurance authorization.   Follow Up Recommendations  Acute inpatient rehab (3hours/day)    Assistance Recommended at Discharge Frequent or constant Supervision/Assistance  Patient can return home with the following Two people to help with walking and/or transfers;A lot of help with bathing/dressing/bathroom    Functional Status Assessment  Patient has had a recent decline in their functional status and demonstrates the ability to make significant improvements in function in a reasonable and predictable amount of time.  Equipment Recommendations  Other (comment) (TBD pending progress)    Recommendations for  Other Services Rehab consult     Precautions / Restrictions Precautions Precautions: Fall Restrictions Weight Bearing Restrictions: No      Mobility Bed Mobility Overal bed mobility: Needs Assistance Bed Mobility: Supine to Sit, Sit to Supine     Supine to sit: Mod assist, +2 for physical assistance, +2 for safety/equipment, HOB elevated Sit to supine: Max assist, +2 for safety/equipment, +2 for physical assistance   General bed mobility comments: Mod A x 2 to sit on edge of stretcher with posterior bias, assist needed to advance LEs (R >L) and lift trunk with handheld assist. pt required Max A x 2 to safely return to supine on stretcher    Transfers                   General transfer comment: unable to safely attempt from stretcher height      Balance Overall balance assessment: Needs assistance Sitting-balance support: No upper extremity supported, Feet supported, Bilateral upper extremity supported Sitting balance-Leahy Scale: Poor Sitting balance - Comments: posterior bias, reliant on BUE support Postural control: Posterior lean                                 ADL either performed or assessed with clinical judgement   ADL Overall ADL's : Needs assistance/impaired Eating/Feeding: Independent   Grooming: Set up;Sitting   Upper Body Bathing: Minimal assistance;Sitting   Lower Body Bathing: Maximal assistance;Bed level;Sitting/lateral leans   Upper Body Dressing : Minimal assistance;Sitting   Lower Body Dressing: Total assistance;Bed level Lower Body Dressing Details (indicate cue type and reason): to don socks     Toileting- Clothing Manipulation  and Hygiene: Total assistance;Bed level         General ADL Comments: Pt with progressive RLE weakness impacting gait at home. Pt with deficits in strength, balance and requiring increased LB ADL assist. At increased risk for falls     Vision Baseline Vision/History: 1 Wears glasses  (reading) Ability to See in Adequate Light: 0 Adequate Patient Visual Report: No change from baseline Vision Assessment?: No apparent visual deficits     Perception     Praxis      Pertinent Vitals/Pain Pain Assessment Pain Assessment: No/denies pain     Hand Dominance Right   Extremity/Trunk Assessment Upper Extremity Assessment Upper Extremity Assessment: Generalized weakness   Lower Extremity Assessment Lower Extremity Assessment: Defer to PT evaluation   Cervical / Trunk Assessment Cervical / Trunk Assessment: Normal   Communication Communication Communication: No difficulties   Cognition Arousal/Alertness: Awake/alert Behavior During Therapy: WFL for tasks assessed/performed Overall Cognitive Status: No family/caregiver present to determine baseline cognitive functioning                                 General Comments: pleasant, follows directions consistently     General Comments       Exercises     Shoulder Instructions      Home Living Family/patient expects to be discharged to:: Private residence Living Arrangements: Children Available Help at Discharge: Family Type of Home: House Home Access: Level entry     Home Layout: One level     Bathroom Shower/Tub: Occupational psychologist: Handicapped height     Home Equipment: Civil engineer, contracting;Wheelchair - Publishing copy (2 wheels)   Additional Comments: After selling her home, pt moved in with daughter 6 weeks ago. still looking for her own home      Prior Functioning/Environment Prior Level of Function : Needs assist             Mobility Comments: for the past month, pt has been using w/c for mobility due to difficulty ambulating without assistance. prior to one month ago, pt was using RW to ambulate ADLs Comments: Daughter has been assisting some more recently for LB dressing, shower transfers but once in shower, able to bathe self.        OT Problem List:  Decreased strength;Decreased activity tolerance;Impaired balance (sitting and/or standing);Decreased knowledge of use of DME or AE;Obesity      OT Treatment/Interventions: Self-care/ADL training;Therapeutic exercise;Energy conservation;DME and/or AE instruction;Therapeutic activities;Patient/family education;Balance training    OT Goals(Current goals can be found in the care plan section) Acute Rehab OT Goals Patient Stated Goal: be able to walk again, increase strength OT Goal Formulation: With patient Time For Goal Achievement: 10/21/21 Potential to Achieve Goals: Good ADL Goals Pt Will Perform Lower Body Bathing: with min assist;sit to/from stand;sitting/lateral leans Pt Will Transfer to Toilet: with min assist;stand pivot transfer;bedside commode Pt Will Perform Toileting - Clothing Manipulation and hygiene: with min assist;sit to/from stand;sitting/lateral leans  OT Frequency: Min 2X/week    Co-evaluation PT/OT/SLP Co-Evaluation/Treatment: Yes Reason for Co-Treatment: For patient/therapist safety;To address functional/ADL transfers   OT goals addressed during session: ADL's and self-care;Strengthening/ROM      AM-PAC OT "6 Clicks" Daily Activity     Outcome Measure Help from another person eating meals?: None Help from another person taking care of personal grooming?: A Little Help from another person toileting, which includes using toliet, bedpan, or urinal?: Total Help from  another person bathing (including washing, rinsing, drying)?: A Lot Help from another person to put on and taking off regular upper body clothing?: A Little Help from another person to put on and taking off regular lower body clothing?: Total 6 Click Score: 14   End of Session Nurse Communication: Mobility status  Activity Tolerance: Patient tolerated treatment well Patient left: in bed;with call bell/phone within reach  OT Visit Diagnosis: Unsteadiness on feet (R26.81);Other abnormalities of gait and  mobility (R26.89);Muscle weakness (generalized) (M62.81)                Time: 2103-1281 OT Time Calculation (min): 16 min Charges:  OT General Charges $OT Visit: 1 Visit OT Evaluation $OT Eval Moderate Complexity: 1 Mod  Malachy Chamber, OTR/L Acute Rehab Services Office: [phone]   Layla Maw 10/07/2021, 11:14 AM

## 2021-10-07 NOTE — Progress Notes (Addendum)
STROKE TEAM PROGRESS NOTE   INTERVAL HISTORY Patient evaluated at beside, no family present. Reports that she is back to baseline, no dysarthria noted on eval. Denies any prior Hx of stroke. Shares she has had problems with memory for past couple of years. Is able to name 9/15 animals. Does not have OP Cardiologist MRI scan of the brain shows a tiny punctate corpus callosum splenial infarct.  There is a questionable right frontal punctate cortical diffusion hyperintensity possibly artifact versus infarct .EKG appears abnormal possible A-fib Vitals:   10/07/21 0500 10/07/21 0600 10/07/21 0621 10/07/21 0752  BP: (!) 164/100 (!) 152/94    Pulse: 72 68 61   Resp: (!) 21 (!) 21 19   Temp:    98.3 F (36.8 C)  TempSrc:    Oral  SpO2: 96% 96% 96%    CBC:  Recent Labs  Lab 10/06/21 1551 10/07/21 0448  WBC 6.1 5.2  NEUTROABS 4.4 3.2  HGB 11.4* 11.2*  HCT 36.7 36.5  MCV 84.6 84.9  PLT 181 546   Basic Metabolic Panel:  Recent Labs  Lab 10/06/21 1551 10/07/21 0448  NA 136 139  K 3.7 3.5  CL 99 100  CO2 26 28  GLUCOSE 124* 128*  BUN 14 13  CREATININE 1.02* 0.99  CALCIUM 8.9 9.2  MG  --  1.1*   Lipid Panel:  Recent Labs  Lab 10/07/21 0448  CHOL 91  TRIG 77  HDL 44  CHOLHDL 2.1  VLDL 15  LDLCALC 32   HgbA1c:  Recent Labs  Lab 10/07/21 0448  HGBA1C 7.2*   Urine Drug Screen:  Recent Labs  Lab 10/06/21 2030  LABOPIA NONE DETECTED  COCAINSCRNUR NONE DETECTED  LABBENZ NONE DETECTED  AMPHETMU NONE DETECTED  THCU NONE DETECTED  LABBARB NONE DETECTED    Alcohol Level No results for input(s): "ETH" in the last 168 hours.  IMAGING past 24 hours CT ANGIO HEAD NECK W WO CM  Result Date: 10/07/2021 CLINICAL DATA:  Follow-up examination for stroke. EXAM: CT ANGIOGRAPHY HEAD AND NECK TECHNIQUE: Multidetector CT imaging of the head and neck was performed using the standard protocol during bolus administration of intravenous contrast. Multiplanar CT image reconstructions  and MIPs were obtained to evaluate the vascular anatomy. Carotid stenosis measurements (when applicable) are obtained utilizing NASCET criteria, using the distal internal carotid diameter as the denominator. RADIATION DOSE REDUCTION: This exam was performed according to the departmental dose-optimization program which includes automated exposure control, adjustment of the mA and/or kV according to patient size and/or use of iterative reconstruction technique. CONTRAST:  79m OMNIPAQUE IOHEXOL 350 MG/ML SOLN COMPARISON:  CT and MRI from earlier the same day. FINDINGS: CTA NECK FINDINGS Aortic arch: Visualized aortic arch normal in caliber with standard 3 vessel morphology. Mild atheromatous change about the arch itself. No stenosis about the origin the great vessels. Right carotid system: Right common and internal carotid arteries are tortuous. Moderate calcified plaque about the right carotid bulb without hemodynamically significant stenosis. No dissection or occlusion. Left carotid system: Left common and internal carotid arteries are tortuous. Mild atheromatous change about the left carotid bulb without hemodynamically significant stenosis. No dissection or occlusion. Vertebral arteries: Both vertebral arteries arise from subclavian arteries. No proximal subclavian artery stenosis. Right vertebral artery dominant. Vertebral arteries patent without stenosis or dissection. Skeleton: No discrete or worrisome osseous lesions. Moderate degenerative spurring noted throughout the visualized cervicothoracic spine. Moderate to advanced osteoarthritic changes present about the C1-2 articulation. Other neck: No other  acute soft tissue abnormality within the neck. Few scattered thyroid nodules noted, largest of which measures up to 2.3 cm on the left and is partially calcified. Upper chest: Layering left pleural effusion, partially visualized. No other acute abnormality within the visualized upper chest. Review of the MIP  images confirms the above findings CTA HEAD FINDINGS Anterior circulation: Petrous segments patent bilaterally. Atheromatous plaque within the carotid siphons without hemodynamically significant stenosis. A1 segments patent bilaterally. Normal anterior communicating artery complex. Anterior cerebral arteries widely patent without stenosis. No M1 stenosis or occlusion. No proximal MCA branch occlusion or stenosis. Distal MCA branches perfused and symmetric. Posterior circulation: Atheromatous plaque within the dominant proximal right V4 segment with associated mild stenosis (series 7, image 142). Hypoplastic left V4 segment widely patent. Left PICA patent. Right PICA not well seen. Basilar patent to its distal aspect without stenosis. Superior cerebral arteries patent bilaterally. Left PCA supplied via a hypoplastic left P1 segment and robust left posterior communicating artery. Fetal type origin right PCA. Both PCAs patent to their distal aspects without stenosis. Venous sinuses: Patent allowing for timing the contrast bolus. Anatomic variants: As above.  No aneurysm. Review of the MIP images confirms the above findings IMPRESSION: 1. Negative CTA for large vessel occlusion or other emergent finding. 2. Mild-to-moderate atheromatous change about the carotid bifurcations and carotid siphons without hemodynamically significant or correctable stenosis. 3. Diffuse tortuosity of the major arterial vasculature of the head and neck, suggesting chronic underlying hypertension. 4. Layering left pleural effusion, partially visualized. 5. 2.3 cm left thyroid nodule, indeterminate. Further evaluation with dedicated thyroid ultrasound recommended. This could be performed on a nonemergent outpatient basis (Ref: J Am Coll Radiol. 2015 Feb;12(2): 143-50). Electronically Signed   By: Jeannine Boga M.D.   On: 10/07/2021 00:24   MR ANGIO HEAD WO CONTRAST  Result Date: 10/06/2021 CLINICAL DATA:  Stroke/TIA. Abnormal MR head  with cortical infarcts of the right frontal operculum and focal infarct involving the splenium of the corpus callosum. EXAM: MRA HEAD WITHOUT CONTRAST TECHNIQUE: Angiographic images of the Circle of Willis were acquired using MRA technique without intravenous contrast. COMPARISON:  MR head without contrast 10/06/2021 FINDINGS: Anterior circulation: Internal carotid arteries are within normal limits the high cervical segments through the ICA termini bilaterally. The A1 and M1 segments are normal. No definite anterior communicating artery is present. MCA bifurcations are within normal limits. The ACA and MCA branch vessels are normal. Posterior circulation: Right vertebral artery is dominant vessel. Vertebrobasilar junction basilar artery are. Fetal type posterior cerebral arteries are present bilaterally. Small P1 segments are noted. PCA branch vessels are within normal limits bilaterally. Anatomic variants: Bilateral fetal type posterior cerebral arteries. Other: None. IMPRESSION: 1. Normal MRA circle-of-Willis. No significant proximal stenosis, aneurysm, or branch vessel occlusion. The up Electronically Signed   By: San Morelle M.D.   On: 10/06/2021 18:54   MR BRAIN WO CONTRAST  Result Date: 10/06/2021 CLINICAL DATA:  TIA. Sudden onset of confusion at 1:30 this afternoon. EXAM: MRI HEAD WITHOUT CONTRAST TECHNIQUE: Multiplanar, multiecho pulse sequences of the brain and surrounding structures were obtained without intravenous contrast. COMPARISON:  CT head without contrast 10/06/2021. FINDINGS: Brain: Diffusion-weighted images demonstrate focal area of restricted cortical diffusion in the right frontal operculum punctate focus of restricted diffusion is also present within the splenium of the corpus callosum. Subtle FLAIR signal is present both locations. Moderate generalized atrophy and diffuse white matter changes are present otherwise. White matter changes extend into the brainstem. Cerebellum is  unremarkable. Vascular: Flow is present in the major intracranial arteries. The craniocervical junction is normal. Upper cervical spine is within normal limits. Marrow signal is unremarkable. Skull and upper cervical spine: Degenerative changes are present in the upper cervical spine. Prominent soft tissue is present posterior to the dens. Craniocervical junction is otherwise within normal limits. Sinuses/Orbits: Bilateral mastoid effusions are present. No obstructing nasopharyngeal lesion is present. Paranasal sinuses otherwise clear. Bilateral lens replacements are noted. Globes and orbits are otherwise unremarkable. IMPRESSION: 1. Acute/subacute cortical nonhemorrhagic infarct involving the right frontal operculum. 2. Acute subacute punctate nonhemorrhagic infarct in the splenium of the corpus callosum. 3. Question central embolic etiology. 4. Moderate generalized atrophy and diffuse white matter disease likely reflects the sequela of chronic microvascular ischemia. 5. Bilateral mastoid effusions. No obstructing nasopharyngeal lesion is present. Electronically Signed   By: San Morelle M.D.   On: 10/06/2021 18:48   CT Head Wo Contrast  Result Date: 10/06/2021 CLINICAL DATA:  Altered mental status. EXAM: CT HEAD WITHOUT CONTRAST TECHNIQUE: Contiguous axial images were obtained from the base of the skull through the vertex without intravenous contrast. RADIATION DOSE REDUCTION: This exam was performed according to the departmental dose-optimization program which includes automated exposure control, adjustment of the mA and/or kV according to patient size and/or use of iterative reconstruction technique. COMPARISON:  02/08/2012 FINDINGS: Brain: No evidence of intracranial hemorrhage, acute infarction, hydrocephalus, extra-axial collection, or mass lesion/mass effect. Mild cerebral atrophy and moderate chronic small vessel disease shows progression since prior exam. Vascular:  No hyperdense vessel or  other acute findings. Skull: No evidence of fracture or other significant bone abnormality. Sinuses/Orbits:  No acute findings. Other: None. IMPRESSION: No acute intracranial abnormality. Cerebral atrophy and chronic small vessel disease. Electronically Signed   By: Marlaine Hind M.D.   On: 10/06/2021 17:24    --PHYSICAL EXAM-- Constitutional: Overweight, elderly African-American lady, no acute distress  HEENT: Normocephalic, Normal conjunctiva, anicteric. Hearing grossly intact. No nasal discharge. Neck: Neck is supple. No masses or thyromegaly. Resp: Respirations are non-labored. No wheezing. Skin: Warm. No rashes or ulcers.  Neuro Mental Status AAOx4; speech fluent; language and comprehension intact; recalls 3/3 words after 5 minutes; concentration and attention intact  Cranial Nerves II: Nor abnormalities in visual fields. III, IV, VI: PERRLA, EOMI, no nystagmus V: Normal sensation in V1, V2, and V3 segments bilaterally  VII: Facial symmetry at rest and during various facial expressions VIII: Normal hearing to speech  IX, X: Normal palatal elevation, no uvular deviation  XI: Trapezius 5/5, SCM 5/5 XII: Symmetric tongue movement, tongue is midline without atrophy or fasciculations.   Motor Normal bulk and tone Strength R UE: 5/5 L UE: 5/5 BLE: 2/5, limited by pain   Sensory  Intact sensation of UE and LE bilaterally.   Cerebellar: FNF:WNL Ulnar Drift: negative  ASSESSMENT/PLAN Jade Ellison is a 76 y.o. female with PMHx of non-insulin-dependent diabetes mellitus type 2, hypertension, CKD stage IIIa, obstructive sleep apnea (CPAP titration study pending), hyperlipidemia, gastroesophageal reflux disease, recurrent urinary tract infections (on supressive therapy with Keflex), diastolic congestive heart failure (Echo 09/2011 EF 55-60% with G1DD) presenting to [facility] emergency department with sudden changes in speech. Also Hx of SVT w/ atrial flutter confirmed on  telemetry. Now asymptomatic. MRI shows acute lacunar infarct of corpus callosum likely 2/2 to cardioembolic source.   Stroke Acute lacunar infarct involving corpus callosum likely small vessel disease Etiology: EKG shows new A-fib hence could be cardioembolic  CT: No acute  findings. Cerebral atrophy and chronic small vessel disease. CTA head & neck: negative for large vessel occlusions. Mild-to-moderate atheromatous change about the carotid bifurcations and carotid siphons without hemodynamically significant or correctable stenosis. MRI: Acute subacute punctate nonhemorrhagic infarct in the splenium of the corpus callosum. Moderate generalized atrophy and diffuse white matter disease likely reflects the sequela of chronic microvascular ischemia. MRA: no acute findings.  2D Echo: LVEF 55-60%, G1DD. Normal left atrial size.  LDL 32 HgbA1c 7.2 VTE prophylaxis - Lovenox aspirin 81 mg daily prior to admission, now on aspirin 81 mg daily for now, new onset a-flutter pending cardiology recommendations for anticoagulation.  Therapy recommendations:  AIR  Disposition:  pending  Hypertension Home meds:  losartan-hctz (Hyzaar) 100-12.5 mg Stable Permissive hypertension (OK if < 220/120) but gradually normalize in 5-7 days Long-term BP goal normotensive  Hyperlipidemia Home meds:  Lipitor 20, resumed in hospital LDL 32, goal < 70 Continue statin at discharge  Diabetes type II Uncontrolled Home meds:  Metformin 1000 mg, Glipizide HgbA1c 7.2, goal < 7.0 CBGs Recent Labs    10/06/21 1550 10/07/21 0819  GLUCAP 110* 128*    SSI   New Onset Atrial Flutter vs Atrial Fibrillation Consult placed to Cardiology, anticoagulation management held for now Agree with primary team's recommendations Home meds: Metropolol 50 mg BID, continued in-house  Other Stroke Risk Factors Advanced Age >/= 24  Obesity,  BMI >/= 30 associated with increased stroke risk, recommend weight loss, diet and exercise  as appropriate  Coronary artery disease Obstructive sleep apnea, Hx of non-adherence.  Congestive heart failure  Hospital day # 0  Christene Slates, MD PGY-1  I have personally obtained history,examined this patient, reviewed notes, independently viewed imaging studies, participated in medical decision making and plan of care.ROS completed by me personally and pertinent positives fully documented  I have made any additions or clarifications directly to the above note. Agree with note above.  Patient presented with transient dysarthria which appears to have improved but MRI shows corpus callosal small infarct and questionable right frontal cortical diffusion hyperintensity infarct versus artifact.  EKG shows new onset atrial fibrillation flutter and patient will likely need to be on long-term anticoagulation.  Continue ongoing stroke work-up.  Aggressive risk factor modification.  Patient counseled to use her CPAP for sleep apnea.  Greater than 50% time during this 50-minute visit was spent on counseling and coordination of care and discussion about atrial fibrillation and stroke risk and discussion about stroke prevention and treatment and answering questions.  Discussed with Dr. Oneta Rack, MD Medical Director [place] Pager: [phone] 10/07/2021 6:16 PM  To contact Stroke Continuity provider, please refer to [email or web address] After hours, contact General Neurology

## 2021-10-07 NOTE — Progress Notes (Signed)
Inpatient Rehab Admissions Coordinator:   Per therapy recommendations, patient was screened for CIR candidacy by Clemens Catholic, MS, CCC-SLP . At this time, due to bed level eval, I am unable to assess if Pt. Could tolerate the intensity of CIR.  I will follow and rescreen once Pt. Has attempted OOB. Please contact me with any questions.   Clemens Catholic, [place], [place] Admissions Coordinator  [phone] ([place]) [phone] (office)

## 2021-10-07 NOTE — Evaluation (Signed)
Speech Language Pathology Evaluation Patient Details Name: Jade Ellison MRN: [id number] DOB: [birth year] Today's Date: 10/07/2021 Time: 0203-0234 SLP Time Calculation (min) (ACUTE ONLY): 31 min  Problem List:  Patient Active Problem List   Diagnosis Date Noted   Acute ischemic stroke ([place]) 10/06/2021   Type 2 diabetes mellitus with stage 3a chronic kidney disease ([place]) 10/06/2021   Paroxysmal atrial fibrillation ([place]) 10/06/2021   Acute cystitis without hematuria 10/06/2021   Polyp of transverse colon    Polyp of ascending colon    H/O hernia repair 04/07/2019   H/O tubal ligation 04/07/2019   History of cholecystectomy 04/07/2019   Recurrent UTI 01/16/2019   GAD (generalized anxiety disorder) 09/20/2017   Mixed diabetic hyperlipidemia associated with type 2 diabetes mellitus ([place]) 04/01/2017   Chronic kidney disease, stage 3a ([place]) 10/13/2016   GERD (gastroesophageal reflux disease)    Diabetic peripheral neuropathy associated with type 2 diabetes mellitus ([place]) 11/17/2014   Barrett's esophagus without dysplasia 06/26/2014   Dyslipidemia 05/24/2013   Morbid obesity ([place]) 08/31/2012   SVT (supraventricular tachycardia) ([place]) 09/19/2011   Aphagia 09/19/2011   Migraine headache with aura 09/19/2011   History of lower GI bleeding 04/10/2011   Iron deficiency anemia 04/10/2011   Hypertension associated with stage 3 chronic kidney disease due to type 2 diabetes mellitus ([place]) 05/21/2007   COLONIC POLYPS 03/25/2007   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 76/19/5093   DEPRESSION 03/25/2007   Essential hypertension 03/25/2007   HEMORRHOIDS 03/25/2007   DIVERTICULOSIS, COLON 03/25/2007   OSA (obstructive sleep apnea) 03/25/2007   NEPHROLITHIASIS, HX OF 03/25/2007   Primary osteoarthritis of both knees 12/15/2006   Past Medical History:  Past Medical History:  Diagnosis Date   Adult stuttering    Anemia    Anxiety    Panic attack- - 1 year  ago (cries - doneest know why and gets nervous   Arthritis    Barrett esophagus 10/14/10   CAD (coronary artery disease)    Chronic kidney disease, stage 3a ([place]) 10/13/2016   GFR 47 June 2018   Colon polyp    polypoid colorectal mucosa   Complication of anesthesia    Depression    Diverticulosis    DJD (degenerative joint disease)    left knee   Esophagitis    Gastritis    Glaucoma    Helicobacter pylori (H. pylori)    Hemorrhoids    Hernia of unspecified site of abdominal cavity without mention of obstruction or gangrene    History of blood transfusion    History of kidney stones    Hypertension 09 07 2013   TRANSTHORACIC ECHO STUDY CONCLUSIONS    Hypertension 09 07 2013   EJECTION FRACTION- 55%-60% .WALL MOTION WAS NORMAL   Impaired mobility    Iron deficiency anemia    Morbid obesity (HCC)    Neuropathy    Obesity    Olecranon bursitis of left elbow    OSA (obstructive sleep apnea)    On cpap   Osteoarthritis    right hip   Osteoporosis    Primary osteoarthritis    Bilaterally (knee)   PSVT (paroxysmal supraventricular tachycardia) (HCC)    PSVT (paroxysmal supraventricular tachycardia) (HCC)    Short-term memory loss    Shortness of breath dyspnea    Sleep apnea    on CPAP- does not use machine   Spinal headache 1986   Type 2 diabetes mellitus ([place])    Past Surgical History:  Past Surgical History:  Procedure Laterality Date   BIOPSY  11/07/2019   Procedure: BIOPSY;  Surgeon: Mauri Pole, MD;  Location: [facility];  Service: Endoscopy;;   CARDIAC CATHETERIZATION  2003    normal coronary arteries and nomal LV function by cath performed by Dr.Peter Los Arcos     COLONOSCOPY WITH PROPOFOL N/A 11/07/2019   Procedure: COLONOSCOPY WITH PROPOFOL;  Surgeon: Mauri Pole, MD;  Location: [facility];  Service: Endoscopy;  Laterality: N/A;   ENTEROSCOPY  02/05/2012   Procedure: ENTEROSCOPY;  Surgeon: Lafayette Dragon, MD;  Location: [facility];  Service: Endoscopy;  Laterality: N/A;   ESOPHAGOGASTRODUODENOSCOPY N/A 04/19/2014   Procedure: ESOPHAGOGASTRODUODENOSCOPY (EGD);  Surgeon: Lafayette Dragon, MD;  Location: [facility];  Service: Endoscopy;  Laterality: N/A;   ESOPHAGOGASTRODUODENOSCOPY (EGD) WITH PROPOFOL N/A 08/01/2015   Procedure: ESOPHAGOGASTRODUODENOSCOPY (EGD) WITH PROPOFOL;  Surgeon: Mauri Pole, MD;  Location: [facility];  Service: Endoscopy;  Laterality: N/A;   ESOPHAGOGASTRODUODENOSCOPY (EGD) WITH PROPOFOL N/A 11/07/2019   Procedure: ESOPHAGOGASTRODUODENOSCOPY (EGD) WITH PROPOFOL;  Surgeon: Mauri Pole, MD;  Location: [facility];  Service: Endoscopy;  Laterality: N/A;   HERNIA REPAIR     HOT HEMOSTASIS  02/05/2012   Procedure: HOT HEMOSTASIS (ARGON PLASMA COAGULATION/BICAP);  Surgeon: Lafayette Dragon, MD;  Location: [facility];  Service: Endoscopy;  Laterality: N/A;   KNEE ARTHROSCOPY Left    left sided lithotripsy     POLYPECTOMY  11/07/2019   Procedure: POLYPECTOMY;  Surgeon: Mauri Pole, MD;  Location: [facility];  Service: Endoscopy;;   TUBAL LIGATION     HPI:  Pt is a 76 y/o female who presented with speech difficulties and progressive mobility decline. 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 PMH: anxiety, CAD, Barrett esophagus, depression, DJD L knee, glaucoma, hernia, HTN, CHF, obesity, OA R hip, PSVT, DM2   Assessment / Plan / Recommendation Clinical Impression   Pt seen for cognitive/language evaluation with daughter and son at bedside. Pt was alert throughout encounter and oriented to person, but not day. Pt states she has experienced a recent decline in memory and word finding abilities. Family reports she had minor memory deficits prior to recent stroke, but they were unclear on the extent of baseline deficits. Oral motor assessment appeared grossly WFL. No issues with speech intelligibility noted. The  [place] Assessment was administered to further evaluate cognitive functioning. The pt scored 10/30, indicating a deficit in cognition. The pt displayed issues in both short and long term memory. The recall of items, numbers and aspects of a story presented as a challenge for the pt, however, semantic cues proved useful in aiding recall. Difficulty with problem solving was also noted, as pt was unable to solve a simple problem provided maximal verbal cues.Throughout encounter, the environmental distractions were considerably high, which could have potentially influenced performance. Pt and family educated on nature of cognition post stroke, compensatory memory strategies, and the differences in SLP treatment upon discharge. ST will follow for cognitive/language goals.    SLP Assessment  SLP Recommendation/Assessment: Patient needs continued Speech [place] [facility] SLP Visit Diagnosis: Cognitive communication deficit (R41.841)    Recommendations for follow up therapy are one component of a multi-disciplinary discharge planning process, led by the attending physician.  Recommendations may be updated based on patient status, additional functional criteria and insurance authorization.    Follow Up Recommendations  Home health SLP  Assistance Recommended at Discharge  Intermittent Supervision/Assistance  Functional Status Assessment Patient has had a recent decline in their functional status and demonstrates the ability to make significant improvements in function in a reasonable and predictable amount of time.  Frequency and Duration min 2x/week  2 weeks      SLP Evaluation Cognition  Overall Cognitive Status: No family/caregiver present to determine baseline cognitive functioning Arousal/Alertness: Awake/alert Orientation Level: Oriented to person Year: 2023 Day of Week: Incorrect Memory: Impaired Memory Impairment: Decreased recall of new  information;Decreased long term memory;Decreased short term memory Decreased Long Term Memory: Verbal basic Decreased Short Term Memory: Verbal basic Awareness: Appears intact Problem Solving: Impaired Problem Solving Impairment: Verbal basic Safety/Judgment: Appears intact       Comprehension  Auditory Comprehension Overall Auditory Comprehension: Appears within functional limits for tasks assessed Yes/No Questions: Not tested Commands: Within Functional Limits Visual Recognition/Discrimination Discrimination: Not tested Reading Comprehension Reading Status: Not tested    Expression Expression Primary Mode of Expression: Verbal Verbal Expression Overall Verbal Expression: Impaired Initiation: No impairment Level of Generative/Spontaneous Verbalization: Conversation Naming: Impairment Divergent: 50-74% accurate Pragmatics: No impairment Effective Techniques: Semantic cues Non-Verbal Means of Communication: Not applicable Written Expression Dominant Hand: Right Written Expression: Not tested   Oral / Motor  Oral Motor/Sensory Function Overall Oral Motor/Sensory Function: Within functional limits Motor Speech Overall Motor Speech: Appears within functional limits for tasks assessed Respiration: Within functional limits Phonation: Normal Resonance: Within functional limits Articulation: Within functional limitis Intelligibility: Intelligible Motor Planning: Witnin functional limits Motor Speech Errors: Not applicable            [employer] Student SLP  10/07/2021, 3:58 PM

## 2021-10-07 NOTE — Consult Note (Signed)
Cardiology Consultation   Patient ID: Jade Ellison MRN: [id number]; DOB: [birth year]  Admit date: 10/06/2021 Date of Consult: 10/07/2021  PCP:  Bernerd Limbo, MD   [facility] Providers Cardiologist:  None        Patient Profile:   Jade Ellison is a 76 y.o. female with a hx of DM type 2 (not on insulin), hypertension, obstructive sleep (not historically compliant with CPAP), hyperlipidemia, GERD, recurrent UTI now on chronic Keflex, diastolic congestive heart failure who is being seen 10/07/2021 for the evaluation of afib with CVA at the request of Dr. Doristine Bosworth.  History of Present Illness:   Jade Ellison presented to [facility] ED via EMS on 9/24 due to sudden onset of dysarthria/aphasia. Patient reports a gradual decline in physical strength with progressive gait issues since last month. She reportedly began with sudden right lower extremity weakness which led to her using a wheelchair with increased frequency.  Chart review of old cardiology notes seems to indicate that patient has been at least moderately wheelchair-bound secondary to obesity and degenerative joint disease since 2020. Patient continued to feel more and more unsteady overtime. However, it wasn't until yesterday that patient had any sort of focal change to neurological function. Per family, patient had a return of normal speech within an  hour of symptom onset and they deny observation of facial droop or unilateral weakness. Patient denies changes to vision or headache. Patient's daughter notes that at the time EMS was called, she noted patient's BP to be elevated above baseline. In discussion today, patient denies any recent symptoms of significant chest pain. She states that she'll intermittently get pain with meals and that burping resolves this discomfort. She denies any sensation of palpitations or irregular heart beat. Confirms progressive weakness over the last month and states that she has noticed that she  becomes easily winded with exertion.  In the ED, patient went for noncontrast CT which was unremarkable for acute abnormality.  She then underwent MRI of the brain which noted multiple acute to subacute areas of infarct involving right frontal operculum and splenium of corpus callosum. Initial ECG was notable for afib (no previous diagnosis). Patient's home medication regimen includes Metoprolol '50mg'$  BID and she has remained rate controlled throughout time in ED/hospitalization.  Patient's cardiac history includes a left heart cath in 200 place 3 with Dr. Johnsie Cancel which found normal coronary arteries and normal LV function.  Her most recent visit was in May 2020 with Dr. Alvester Chou, no changes were made to her goal-directed medical therapy, she was continued on losartan, hydrochlorothiazide, and metoprolol.   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 pylori)    Hemorrhoids    Hernia of unspecified site of abdominal cavity without mention of obstruction or gangrene    History of blood transfusion    History of kidney stones  Hypertension 09 07 2013   TRANSTHORACIC ECHO STUDY CONCLUSIONS    Hypertension 09 07 2013   EJECTION FRACTION- 55%-60% .WALL MOTION WAS NORMAL   Impaired mobility    Iron deficiency anemia    Morbid obesity (HCC)    Neuropathy    Obesity    Olecranon bursitis of left elbow    OSA (obstructive sleep apnea)    On cpap   Osteoarthritis    right hip   Osteoporosis    Primary osteoarthritis     Bilaterally (knee)   PSVT (paroxysmal supraventricular tachycardia) (HCC)    PSVT (paroxysmal supraventricular tachycardia) (HCC)    Short-term memory loss    Shortness of breath dyspnea    Sleep apnea    on CPAP- does not use machine   Spinal headache 1986   Type 2 diabetes mellitus ([place])     Past Surgical History:  Procedure Laterality Date   BIOPSY  11/07/2019   Procedure: BIOPSY;  Surgeon: Mauri Pole, MD;  Location: [facility] END[facility]COPY;  Service: Endoscopy;;   CARDIAC CATHETERIZATION  2003    normal coronary arteries and nomal LV function by cath performed by Dr.Peter Topanga WITH PROPOFOL N/A 11/07/2019   Procedure: COLONOSCOPY WITH PROPOFOL;  Surgeon: Mauri Pole, MD;  Location: [facility];  Service: Endoscopy;  Laterality: N/A;   ENTEROSCOPY  02/05/2012   Procedure: ENTEROSCOPY;  Surgeon: Lafayette Dragon, MD;  Location: [facility];  Service: Endoscopy;  Laterality: N/A;   ESOPHAGOGASTRODUODENOSCOPY N/A 04/19/2014   Procedure: ESOPHAGOGASTRODUODENOSCOPY (EGD);  Surgeon: Lafayette Dragon, MD;  Location: [facility];  Service: Endoscopy;  Laterality: N/A;   ESOPHAGOGASTRODUODENOSCOPY (EGD) WITH PROPOFOL N/A 08/01/2015   Procedure: ESOPHAGOGASTRODUODENOSCOPY (EGD) WITH PROPOFOL;  Surgeon: Mauri Pole, MD;  Location: [facility];  Service: Endoscopy;  Laterality: N/A;   ESOPHAGOGASTRODUODENOSCOPY (EGD) WITH PROPOFOL N/A 11/07/2019   Procedure: ESOPHAGOGASTRODUODENOSCOPY (EGD) WITH PROPOFOL;  Surgeon: Mauri Pole, MD;  Location: [facility];  Service: Endoscopy;  Laterality: N/A;   HERNIA REPAIR     HOT HEMOSTASIS  02/05/2012   Procedure: HOT HEMOSTASIS (ARGON PLASMA COAGULATION/BICAP);  Surgeon: Lafayette Dragon, MD;  Location: [facility];  Service: Endoscopy;  Laterality: N/A;   KNEE ARTHROSCOPY Left    left sided lithotripsy     POLYPECTOMY  11/07/2019   Procedure: POLYPECTOMY;  Surgeon: Mauri Pole, MD;  Location: [facility];  Service: Endoscopy;;   TUBAL LIGATION       Home Medications:  Prior to Admission medications   Medication Sig Start Date End Date Taking? Authorizing Provider  acetaminophen (TYLENOL) 325 MG tablet Take 650 mg by mouth every 6 (six) hours as needed for moderate pain or headache.   Yes [provider]  ALPRAZolam (XANAX) 0.25 MG tablet Take 0.25 mg by mouth 2 (two) times daily as needed for anxiety.  08/17/12  Yes [provider]  aspirin EC 81 MG tablet Take 162 mg by mouth daily.   Yes [provider]  atorvastatin (LIPITOR) 20 MG tablet Take 20 mg by mouth daily.   Yes [provider]  Cyanocobalamin (B-12 PO) Take 1 capsule by mouth daily.   Yes [provider]  desonide (DESOWEN) 0.05 % cream Apply 1 application topically 2 (two) times daily as needed (dry skin/irritation).    Yes [provider]  diclofenac sodium (VOLTAREN) 1 % GEL Apply 2 g topically 4 (four) times daily.  Patient taking differently: Apply 2 g topically daily as needed (pain). 04/22/12  Yes Prueter, Santiago Glad, PA-C  dicyclomine (BENTYL) 20 MG tablet TAKE 1 TABLET(20 MG) BY MOUTH THREE TIMES DAILY AFTER MEALS Patient taking differently: Take 20 mg by mouth 2 (two) times daily. 05/19/17  Yes Nandigam, Venia Minks, MD  diphenhydrAMINE (BENADRYL) 25 MG tablet Take 25 mg by mouth daily as needed for itching.   Yes [provider]  DULoxetine (CYMBALTA) 60 MG capsule Take 60 mg by mouth daily.   Yes [provider]  ferrous sulfate 325 (65 FE) MG tablet Take 325 mg by mouth daily.   Yes [provider]  glipiZIDE (GLUCOTROL XL) 10 MG 24 hr tablet Take 10 mg by mouth daily.   Yes [provider]  latanoprost (XALATAN) 0.005 % ophthalmic solution Place 1 drop into both eyes at bedtime.  12/05/11  Yes [provider]  losartan-hydrochlorothiazide (HYZAAR) 100-12.5 MG tablet Take 1 tablet by mouth daily.  07/02/15  Yes [provider]  metFORMIN (GLUCOPHAGE) 1000 MG tablet Take 1,000 mg by mouth 2 (two) times daily with a meal.    Yes [provider]  metoprolol tartrate (LOPRESSOR) 50 MG tablet Take 50 mg by mouth 2 (two) times daily.   Yes [provider]  Mouthwashes (BIOTENE/CALCIUM MT) Take 1 Dose by mouth daily as needed (dry mouth).    Yes [provider]  Multiple Vitamin (MULTIVITAMIN WITH MINERALS) TABS tablet Take 1 tablet by mouth daily.   Yes [provider]  omeprazole (PRILOSEC) 40 MG capsule TAKE 1 CAPSULE(40 MG) BY MOUTH DAILY Patient taking differently: Take 40 mg by mouth daily. 04/28/17  Yes Nandigam, Venia Minks, MD  pregabalin (LYRICA) 50 MG capsule Take 50 mg by mouth 2 (two) times daily.   Yes [provider]  psyllium (METAMUCIL SMOOTH TEXTURE) 28 % packet Take 1 packet by mouth daily as needed (constipation).    Yes [provider]  saccharomyces boulardii (FLORASTOR) 250 MG capsule Take 1 capsule (250 mg total) by mouth daily. 07/27/18  Yes Nandigam, Venia Minks, MD  Vitamin D, Ergocalciferol, (DRISDOL) 1.25 MG (50000 UNIT) CAPS capsule Take 50,000 Units by mouth every Wednesday.   Yes [provider]    Inpatient Medications: Scheduled Meds:  aspirin EC  81 mg Oral Daily   atorvastatin  20 mg Oral Daily   DULoxetine  60 mg Oral Daily   enoxaparin (LOVENOX) injection  40 mg Subcutaneous Q24H   insulin aspart  0-15 Units Subcutaneous TID AC & HS   latanoprost  1 drop Both Eyes QHS   metoprolol tartrate  50 mg Oral BID   pantoprazole  40 mg Oral Daily   pregabalin  50 mg Oral BID   sodium chloride flush  3 mL Intravenous Q12H   Continuous Infusions:  cefTRIAXone (ROCEPHIN)  IV Stopped (10/07/21 0028)   PRN Meds: acetaminophen **OR** acetaminophen, albuterol, hydrALAZINE, ondansetron **OR** ondansetron (ZOFRAN) IV, polyethylene glycol  Allergies:    Allergies  Allergen Reactions   Neurontin  [Gabapentin] Other (See Comments)    Dizziness   Codeine Rash   Fosamax [Alendronate Sodium] Other (See Comments)    Upset stomach    Nsaids Other (See Comments)    Abdominal Pain    Social History:   Social History   Socioeconomic History   Marital status: Widowed    Spouse name: Not on file   Number of children: 1   Years of education: Not on file  Highest education level: Some college, no degree  Occupational History   Occupation: RETIRED    Employer: RETIRED  Tobacco Use   Smoking status: Never   Smokeless tobacco: Never  Vaping Use   Vaping Use: Never used  Substance and Sexual Activity   Alcohol use: Yes    Alcohol/week: 1.0 standard drink of alcohol    Types: 1 Glasses of wine per week    Comment: wine sometimes on holidays and bedtime- rarely   Drug use: No   Sexual activity: Not on file  Other Topics Concern   Not on file  Social History Narrative   04/29/18 Lives alone, dgtr visits, stays often   Caffeine- coffee once a week, sodas rarely   Social Determinants of Health   Financial Resource Strain: Not on file  Food Insecurity: Not on file  Transportation Needs: Not on file  Physical Activity: Not on file  Stress: Not on file  Social Connections: Not on file  Intimate Partner Violence: Not on file    Family History:    Family History  Problem Relation Age of Onset   Heart disease Mother    Heart disease Father    Diabetes Brother    Diabetes Sister    Kidney cancer Brother    Colon cancer Neg Hx      ROS:  Please see the history of present illness.   All other ROS reviewed and negative.     Physical Exam/Data:   Vitals:   10/07/21 1400 10/07/21 1500 10/07/21 1537 10/07/21 1600  BP: (!) 160/85 (!) 146/85  (!) 125/90  Pulse: 71 69  67  Resp: 14 (!) 23  19  Temp:   97.8 F (36.6 C)   TempSrc:   Oral   SpO2: 100% 97%  99%    Intake/Output Summary (Last 24 hours) at 10/07/2021 1728 Last data filed at 10/07/2021 1045 Gross per 24 hour   Intake 203 ml  Output --  Net 203 ml      10/06/2021    5:20 PM 11/07/2019   10:27 AM 03/30/2019    1:53 PM  Last 3 Weights  Weight (lbs) 283 lb 283 lb 288 lb  Weight (kg) 128.368 kg 128.368 kg 130.636 kg     There is no height or weight on file to calculate BMI.  General:  Well nourished, well developed, in no acute distress. Obese appearing HEENT: normal Neck: no JVD Vascular: No carotid bruits; Distal pulses 2+ bilaterally Cardiac:  normal S1, S2; irregularly irregular; no murmur  Lungs:  clear to auscultation bilaterally, no wheezing, rhonchi or rales  Abd: soft, nontender, no hepatomegaly  Ext: no edema Musculoskeletal:  No deformities, BUE and BLE strength normal and equal Skin: warm and dry  Neuro:  CNs 2-12 intact, no focal abnormalities noted Psych:  Normal affect   EKG:  The EKG was personally reviewed and demonstrates:  rate controlled afib Telemetry:  Telemetry was personally reviewed and demonstrates:  rate controlled afib with rare slow ventricular response.  Relevant CV Studies:  10/07/2021 TTE    1. Left ventricular ejection fraction, by estimation, is 55 to 60%. The  left ventricle has normal function. The left ventricle has no regional  wall motion abnormalities. Left ventricular diastolic function could not  be evaluated.   2. Right ventricular systolic function is mildly reduced. The right  ventricular size is normal.   3. The mitral valve is abnormal. Trivial mitral valve regurgitation.   4. The aortic valve  was not well visualized. Aortic valve regurgitation  is not visualized.   5. The inferior vena cava is normal in size with greater than 50%  respiratory variability, suggesting right atrial pressure of 3 mmHg.   6. Agitated saline contrast bubble study was negative, with no evidence  of any interatrial shunt.   Comparison(s): Changes from prior study are noted. 09/14/2011: LVEF 55-60%,  negative bubble study.   FINDINGS   Left Ventricle: Left  ventricular ejection fraction, by estimation, is 55  to 60%. The left ventricle has normal function. The left ventricle has no  regional wall motion abnormalities. The left ventricular internal cavity  size was normal in size. There is   no left ventricular hypertrophy. Left ventricular diastolic function  could not be evaluated due to atrial fibrillation. Left ventricular  diastolic function could not be evaluated.   Right Ventricle: The right ventricular size is normal. No increase in  right ventricular wall thickness. Right ventricular systolic function is  mildly reduced.   Left Atrium: Left atrial size was normal in size.   Right Atrium: Right atrial size was normal in size.   Pericardium: There is no evidence of pericardial effusion.   Mitral Valve: The mitral valve is abnormal. Mild to moderate mitral  annular calcification. Trivial mitral valve regurgitation. MV peak  gradient, 6.6 mmHg. The mean mitral valve gradient is 3.0 mmHg.   Tricuspid Valve: The tricuspid valve is not well visualized. Tricuspid  valve regurgitation is not demonstrated.   Aortic Valve: The aortic valve was not well visualized. Aortic valve  regurgitation is not visualized.   Pulmonic Valve: The pulmonic valve was not well visualized. Pulmonic valve  regurgitation is not visualized.   Aorta: The aortic root and ascending aorta are structurally normal, with  no evidence of dilitation.   Venous: The inferior vena cava is normal in size with greater than 50%  respiratory variability, suggesting right atrial pressure of 3 mmHg.   IAS/Shunts: No atrial level shunt detected by color flow Doppler. Agitated  saline contrast was given intravenously to evaluate for intracardiac  shunting. Agitated saline contrast bubble study was negative, with no  evidence of any interatrial shunt.   Laboratory Data:  High Sensitivity Troponin:   Recent Labs  Lab 10/06/21 1551 10/06/21 2147  TROPONINIHS 17 17      Chemistry Recent Labs  Lab 10/06/21 1551 10/07/21 0448  NA 136 139  K 3.7 3.5  CL 99 100  CO2 26 28  GLUCOSE 124* 128*  BUN 14 13  CREATININE 1.02* 0.99  CALCIUM 8.9 9.2  MG  --  1.1*  GFRNONAA 57* 59*  ANIONGAP 11 11    Recent Labs  Lab 10/06/21 1551 10/07/21 0448  PROT 6.1* 6.2*  ALBUMIN 3.2* 3.1*  AST 21 18  ALT 11 12  ALKPHOS 63 60  BILITOT 0.9 0.8   Lipids  Recent Labs  Lab 10/07/21 0448  CHOL 91  TRIG 77  HDL 44  LDLCALC 32  CHOLHDL 2.1    Hematology Recent Labs  Lab 10/06/21 1551 10/07/21 0448  WBC 6.1 5.2  RBC 4.34 4.30  HGB 11.4* 11.2*  HCT 36.7 36.5  MCV 84.6 84.9  MCH 26.3 26.0  MCHC 31.1 30.7  RDW 14.8 15.0  PLT 181 182   Thyroid No results for input(s): "TSH", "FREET4" in the last 168 hours.  BNP Recent Labs  Lab 10/06/21 1541  BNP 488.3*    DDimer No results for input(s): "DDIMER" in  the last 168 hours.   Radiology/Studies:  ECHOCARDIOGRAM COMPLETE BUBBLE STUDY  Result Date: 10/07/2021    ECHOCARDIOGRAM REPORT   Patient Name:   Jade Ellison Date of Exam: 10/07/2021 Medical Rec #:  [id number]        Height:       63.0 in Accession #:    [id number]       Weight:       283.0 lb Date of Birth:  05-01-1945        BSA:          2.241 m Patient Age:    24 years         BP:           152/94 mmHg Patient Gender: F                HR:           71 bpm. Exam Location:  Inpatient Procedure: 2D Echo, Cardiac Doppler, Color Doppler and Saline Contrast Bubble            Study Indications:    Stroke  History:        Patient has prior history of Echocardiogram examinations, most                 recent 09/14/2011. CHF; Risk Factors:Hypertension, Diabetes, Sleep                 Apnea and Dyslipidemia.  Sonographer:    Eartha Inch Referring Phys: [id number] Vernelle Emerald  Sonographer Comments: Technically difficult study due to poor echo windows. Negative bubble study IMPRESSIONS  1. Left ventricular ejection fraction, by estimation, is 55 to 60%. The  left ventricle has normal function. The left ventricle has no regional wall motion abnormalities. Left ventricular diastolic function could not be evaluated.  2. Right ventricular systolic function is mildly reduced. The right ventricular size is normal.  3. The mitral valve is abnormal. Trivial mitral valve regurgitation.  4. The aortic valve was not well visualized. Aortic valve regurgitation is not visualized.  5. The inferior vena cava is normal in size with greater than 50% respiratory variability, suggesting right atrial pressure of 3 mmHg.  6. Agitated saline contrast bubble study was negative, with no evidence of any interatrial shunt. Comparison(s): Changes from prior study are noted. 09/14/2011: LVEF 55-60%, negative bubble study. FINDINGS  Left Ventricle: Left ventricular ejection fraction, by estimation, is 55 to 60%. The left ventricle has normal function. The left ventricle has no regional wall motion abnormalities. The left ventricular internal cavity size was normal in size. There is  no left ventricular hypertrophy. Left ventricular diastolic function could not be evaluated due to atrial fibrillation. Left ventricular diastolic function could not be evaluated. Right Ventricle: The right ventricular size is normal. No increase in right ventricular wall thickness. Right ventricular systolic function is mildly reduced. Left Atrium: Left atrial size was normal in size. Right Atrium: Right atrial size was normal in size. Pericardium: There is no evidence of pericardial effusion. Mitral Valve: The mitral valve is abnormal. Mild to moderate mitral annular calcification. Trivial mitral valve regurgitation. MV peak gradient, 6.6 mmHg. The mean mitral valve gradient is 3.0 mmHg. Tricuspid Valve: The tricuspid valve is not well visualized. Tricuspid valve regurgitation is not demonstrated. Aortic Valve: The aortic valve was not well visualized. Aortic valve regurgitation is not visualized. Pulmonic Valve: The  pulmonic valve was not well visualized. Pulmonic valve regurgitation is not visualized. Aorta: The aortic  root and ascending aorta are structurally normal, with no evidence of dilitation. Venous: The inferior vena cava is normal in size with greater than 50% respiratory variability, suggesting right atrial pressure of 3 mmHg. IAS/Shunts: No atrial level shunt detected by color flow Doppler. Agitated saline contrast was given intravenously to evaluate for intracardiac shunting. Agitated saline contrast bubble study was negative, with no evidence of any interatrial shunt.  LEFT VENTRICLE PLAX 2D LVIDd:         4.80 cm   Diastology LVIDs:         3.70 cm   LV e' medial:    5.40 cm/s LV PW:         1.00 cm   LV E/e' medial:  23.1 LV IVS:        0.80 cm   LV e' lateral:   6.30 cm/s LVOT diam:     1.70 cm   LV E/e' lateral: 19.8 LVOT Area:     2.27 cm  RIGHT VENTRICLE            IVC RV S prime:     7.80 cm/s  IVC diam: 1.80 cm LEFT ATRIUM             Index        RIGHT ATRIUM           Index LA diam:        3.80 cm 1.70 cm/m   RA Area:     15.10 cm LA Vol (A2C):   46.3 ml 20.66 ml/m  RA Volume:   31.90 ml  14.23 ml/m LA Vol (A4C):   61.9 ml 27.62 ml/m LA Biplane Vol: 56.8 ml 25.35 ml/m   AORTA Ao Root diam: 2.40 cm MITRAL VALVE MV Area (PHT): 3.48 cm     SHUNTS MV Peak grad:  6.6 mmHg     Systemic Diam: 1.70 cm MV Mean grad:  3.0 mmHg MV Vmax:       1.28 m/s MV Vmean:      79.7 cm/s MV Decel Time: 218 msec MV E velocity: 125.00 cm/s Lyman Bishop MD Electronically signed by Lyman Bishop MD Signature Date/Time: 10/07/2021/11:47:07 AM    Final    CT ANGIO HEAD NECK W WO CM  Result Date: 10/07/2021 CLINICAL DATA:  Follow-up examination for stroke. EXAM: CT ANGIOGRAPHY HEAD AND NECK TECHNIQUE: Multidetector CT imaging of the head and neck was performed using the standard protocol during bolus administration of intravenous contrast. Multiplanar CT image reconstructions and MIPs were obtained to evaluate the  vascular anatomy. Carotid stenosis measurements (when applicable) are obtained utilizing NASCET criteria, using the distal internal carotid diameter as the denominator. RADIATION DOSE REDUCTION: This exam was performed according to the departmental dose-optimization program which includes automated exposure control, adjustment of the mA and/or kV according to patient size and/or use of iterative reconstruction technique. CONTRAST:  22m OMNIPAQUE IOHEXOL 350 MG/ML SOLN COMPARISON:  CT and MRI from earlier the same day. FINDINGS: CTA NECK FINDINGS Aortic arch: Visualized aortic arch normal in caliber with standard 3 vessel morphology. Mild atheromatous change about the arch itself. No stenosis about the origin the great vessels. Right carotid system: Right common and internal carotid arteries are tortuous. Moderate calcified plaque about the right carotid bulb without hemodynamically significant stenosis. No dissection or occlusion. Left carotid system: Left common and internal carotid arteries are tortuous. Mild atheromatous change about the left carotid bulb without hemodynamically significant stenosis. No dissection or occlusion. Vertebral arteries: Both  vertebral arteries arise from subclavian arteries. No proximal subclavian artery stenosis. Right vertebral artery dominant. Vertebral arteries patent without stenosis or dissection. Skeleton: No discrete or worrisome osseous lesions. Moderate degenerative spurring noted throughout the visualized cervicothoracic spine. Moderate to advanced osteoarthritic changes present about the C1-2 articulation. Other neck: No other acute soft tissue abnormality within the neck. Few scattered thyroid nodules noted, largest of which measures up to 2.3 cm on the left and is partially calcified. Upper chest: Layering left pleural effusion, partially visualized. No other acute abnormality within the visualized upper chest. Review of the MIP images confirms the above findings CTA  HEAD FINDINGS Anterior circulation: Petrous segments patent bilaterally. Atheromatous plaque within the carotid siphons without hemodynamically significant stenosis. A1 segments patent bilaterally. Normal anterior communicating artery complex. Anterior cerebral arteries widely patent without stenosis. No M1 stenosis or occlusion. No proximal MCA branch occlusion or stenosis. Distal MCA branches perfused and symmetric. Posterior circulation: Atheromatous plaque within the dominant proximal right V4 segment with associated mild stenosis (series 7, image 142). Hypoplastic left V4 segment widely patent. Left PICA patent. Right PICA not well seen. Basilar patent to its distal aspect without stenosis. Superior cerebral arteries patent bilaterally. Left PCA supplied via a hypoplastic left P1 segment and robust left posterior communicating artery. Fetal type origin right PCA. Both PCAs patent to their distal aspects without stenosis. Venous sinuses: Patent allowing for timing the contrast bolus. Anatomic variants: As above.  No aneurysm. Review of the MIP images confirms the above findings IMPRESSION: 1. Negative CTA for large vessel occlusion or other emergent finding. 2. Mild-to-moderate atheromatous change about the carotid bifurcations and carotid siphons without hemodynamically significant or correctable stenosis. 3. Diffuse tortuosity of the major arterial vasculature of the head and neck, suggesting chronic underlying hypertension. 4. Layering left pleural effusion, partially visualized. 5. 2.3 cm left thyroid nodule, indeterminate. Further evaluation with dedicated thyroid ultrasound recommended. This could be performed on a nonemergent outpatient basis (Ref: J Am Coll Radiol. 2015 Feb;12(2): 143-50). Electronically Signed   By: Jeannine Boga M.D.   On: 10/07/2021 00:24   MR ANGIO HEAD WO CONTRAST  Result Date: 10/06/2021 CLINICAL DATA:  Stroke/TIA. Abnormal MR head with cortical infarcts of the right  frontal operculum and focal infarct involving the splenium of the corpus callosum. EXAM: MRA HEAD WITHOUT CONTRAST TECHNIQUE: Angiographic images of the Circle of Willis were acquired using MRA technique without intravenous contrast. COMPARISON:  MR head without contrast 10/06/2021 FINDINGS: Anterior circulation: Internal carotid arteries are within normal limits the high cervical segments through the ICA termini bilaterally. The A1 and M1 segments are normal. No definite anterior communicating artery is present. MCA bifurcations are within normal limits. The ACA and MCA branch vessels are normal. Posterior circulation: Right vertebral artery is dominant vessel. Vertebrobasilar junction basilar artery are. Fetal type posterior cerebral arteries are present bilaterally. Small P1 segments are noted. PCA branch vessels are within normal limits bilaterally. Anatomic variants: Bilateral fetal type posterior cerebral arteries. Other: None. IMPRESSION: 1. Normal MRA circle-of-Willis. No significant proximal stenosis, aneurysm, or branch vessel occlusion. The up Electronically Signed   By: San Morelle M.D.   On: 10/06/2021 18:54   MR BRAIN WO CONTRAST  Result Date: 10/06/2021 CLINICAL DATA:  TIA. Sudden onset of confusion at 1:30 this afternoon. EXAM: MRI HEAD WITHOUT CONTRAST TECHNIQUE: Multiplanar, multiecho pulse sequences of the brain and surrounding structures were obtained without intravenous contrast. COMPARISON:  CT head without contrast 10/06/2021. FINDINGS: Brain: Diffusion-weighted images demonstrate focal area  of restricted cortical diffusion in the right frontal operculum punctate focus of restricted diffusion is also present within the splenium of the corpus callosum. Subtle FLAIR signal is present both locations. Moderate generalized atrophy and diffuse white matter changes are present otherwise. White matter changes extend into the brainstem. Cerebellum is unremarkable. Vascular: Flow is present  in the major intracranial arteries. The craniocervical junction is normal. Upper cervical spine is within normal limits. Marrow signal is unremarkable. Skull and upper cervical spine: Degenerative changes are present in the upper cervical spine. Prominent soft tissue is present posterior to the dens. Craniocervical junction is otherwise within normal limits. Sinuses/Orbits: Bilateral mastoid effusions are present. No obstructing nasopharyngeal lesion is present. Paranasal sinuses otherwise clear. Bilateral lens replacements are noted. Globes and orbits are otherwise unremarkable. IMPRESSION: 1. Acute/subacute cortical nonhemorrhagic infarct involving the right frontal operculum. 2. Acute subacute punctate nonhemorrhagic infarct in the splenium of the corpus callosum. 3. Question central embolic etiology. 4. Moderate generalized atrophy and diffuse white matter disease likely reflects the sequela of chronic microvascular ischemia. 5. Bilateral mastoid effusions. No obstructing nasopharyngeal lesion is present. Electronically Signed   By: San Morelle M.D.   On: 10/06/2021 18:48   CT Head Wo Contrast  Result Date: 10/06/2021 CLINICAL DATA:  Altered mental status. EXAM: CT HEAD WITHOUT CONTRAST TECHNIQUE: Contiguous axial images were obtained from the base of the skull through the vertex without intravenous contrast. RADIATION DOSE REDUCTION: This exam was performed according to the departmental dose-optimization program which includes automated exposure control, adjustment of the mA and/or kV according to patient size and/or use of iterative reconstruction technique. COMPARISON:  02/08/2012 FINDINGS: Brain: No evidence of intracranial hemorrhage, acute infarction, hydrocephalus, extra-axial collection, or mass lesion/mass effect. Mild cerebral atrophy and moderate chronic small vessel disease shows progression since prior exam. Vascular:  No hyperdense vessel or other acute findings. Skull: No evidence of  fracture or other significant bone abnormality. Sinuses/Orbits:  No acute findings. Other: None. IMPRESSION: No acute intracranial abnormality. Cerebral atrophy and chronic small vessel disease. Electronically Signed   By: Marlaine Hind M.D.   On: 10/06/2021 17:24     Assessment and Plan:   Atrial fibrillation with RVR Secondary hypercoagulable state with CHA2DS2-VASc Score = 7   Patient with new finding of rate controlled afib in the setting of CVA. She has been maintained on home Metoprolol Tartrate '50mg'$  without evidence of RVR. TTE today with normal LVEF 50-55% and no significant valvular dysfunction.  Continue rate control with Metoprolol Tartrate '50mg'$  Initiate Eliquis '5mg'$  BID if neurology has no concern for hemorrhagic stroke conversion. Given lack of symptoms and adequate rate control, could consider ongoing rate control vs future rhythm management after a minimum of 1 month of uninterrupted [place]. Recommend repeat sleep study and nightly CPAP use for untreated OSA.  Acute Ischemic Stroke  Patient with transient aphasia/dysarthria found to have multiple areas of acute/subacute infarction on MRI brain in the setting of atrial fibrillation, suspicion for cardioembolic stroke. Neurology has been consulted, feels that at least some of patient's MRI findings could be sequela of chronic microvascular ischemia.   Continue Lipitor '20mg'$  Anti-platelet therapy per neurology recs with possible microvascular component.  Hypertension  BP appears to be well controlled. Continue home Losartan/HCTZ and Metoprolol.  Diabetes type II  HgbA1c 7.2. Management per primary team.  Risk Assessment/Risk Scores:  {  CHA2DS2-VASc Score = 7   This indicates a 11.2% annual risk of stroke. The patient's score is based upon: CHF History:  0 HTN History: 1 Diabetes History: 1 Stroke History: 2 Vascular Disease History: 0 Age Score: 2 Gender Score: 1         For questions or updates, please contact  [place] Please consult [email or web address] for contact info under    Signed, Lily Kocher, PA-C  10/07/2021 5:28 PM

## 2021-10-07 NOTE — Progress Notes (Signed)
ANTICOAGULATION CONSULT NOTE - Initial Consult  Pharmacy Consult for apixaban Indication: atrial fibrillation  Allergies  Allergen Reactions   Neurontin [Gabapentin] Other (See Comments)    Dizziness   Codeine Rash   Fosamax [Alendronate Sodium] Other (See Comments)    Upset stomach    Nsaids Other (See Comments)    Abdominal Pain   Vital Signs: Temp: 97.9 F (36.6 C) (09/25 1729) Temp Source: Oral (09/25 1729) BP: 116/89 (09/25 1729) Pulse Rate: 69 (09/25 1729)  Labs: Recent Labs    10/06/21 1551 10/06/21 2147 10/07/21 0448  HGB 11.4*  --  11.2*  HCT 36.7  --  36.5  PLT 181  --  182  CREATININE 1.02*  --  0.99  TROPONINIHS 17 17  --     Estimated Creatinine Clearance: 63.2 mL/min (by C-G formula based on SCr of 0.99 mg/dL).   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 pylori)    Hemorrhoids    Hernia of unspecified site of abdominal cavity without mention of obstruction or gangrene    History of blood transfusion    History of kidney stones    Hypertension 09 07 2013   TRANSTHORACIC ECHO STUDY CONCLUSIONS    Hypertension 09 07 2013   EJECTION FRACTION- 55%-60% .WALL MOTION WAS NORMAL   Impaired mobility    Iron deficiency anemia    Morbid obesity (HCC)    Neuropathy    Obesity    Olecranon bursitis of left elbow    OSA (obstructive sleep apnea)    On cpap   Osteoarthritis    right hip   Osteoporosis    Primary osteoarthritis    Bilaterally (knee)   PSVT  (paroxysmal supraventricular tachycardia) (HCC)    PSVT (paroxysmal supraventricular tachycardia) (HCC)    Short-term memory loss    Shortness of breath dyspnea    Sleep apnea    on CPAP- does not use machine   Spinal headache 1986   Type 2 diabetes mellitus (HCC)     Medications:  Medications Prior to Admission  Medication Sig Dispense Refill Last Dose   acetaminophen (TYLENOL) 325 MG tablet Take 650 mg by mouth every 6 (six) hours as needed for moderate pain or headache.   Past Week   ALPRAZolam (XANAX) 0.25 MG tablet Take 0.25 mg by mouth 2 (two) times daily as needed for anxiety.    10/04/2021   aspirin EC 81 MG tablet Take 162 mg by mouth daily.   10/04/2021   atorvastatin (LIPITOR) 20 MG tablet Take 20 mg by mouth daily.   10/05/2021   Cyanocobalamin (B-12 PO) Take 1 capsule by mouth daily.   Past Week   desonide (DESOWEN) 0.05 % cream Apply 1 application topically 2 (two) times daily as needed (dry skin/irritation).    10/04/2021   diclofenac sodium (VOLTAREN) 1 %  GEL Apply 2 g topically 4 (four) times daily. (Patient taking differently: Apply 2 g topically daily as needed (pain).) 2 Tube 2 10/04/2021   dicyclomine (BENTYL) 20 MG tablet TAKE 1 TABLET(20 MG) BY MOUTH THREE TIMES DAILY AFTER MEALS (Patient taking differently: Take 20 mg by mouth 2 (two) times daily.) 90 tablet 0 10/04/2021   diphenhydrAMINE (BENADRYL) 25 MG tablet Take 25 mg by mouth daily as needed for itching.   Past Month   DULoxetine (CYMBALTA) 60 MG capsule Take 60 mg by mouth daily.   10/04/2021   ferrous sulfate 325 (65 FE) MG tablet Take 325 mg by mouth daily.   10/04/2021   glipiZIDE (GLUCOTROL XL) 10 MG 24 hr tablet Take 10 mg by mouth daily.   10/04/2021   latanoprost (XALATAN) 0.005 % ophthalmic solution Place 1 drop into both eyes at bedtime.    10/04/2021   losartan-hydrochlorothiazide (HYZAAR) 100-12.5 MG tablet Take 1 tablet by mouth daily.  11 10/04/2021   metFORMIN (GLUCOPHAGE) 1000 MG tablet Take 1,000 mg by  mouth 2 (two) times daily with a meal.    10/04/2021   metoprolol tartrate (LOPRESSOR) 50 MG tablet Take 50 mg by mouth 2 (two) times daily.   10/04/2021 at 0900   Mouthwashes (BIOTENE/CALCIUM MT) Take 1 Dose by mouth daily as needed (dry mouth).    10/04/2021   Multiple Vitamin (MULTIVITAMIN WITH MINERALS) TABS tablet Take 1 tablet by mouth daily.   10/04/2021   omeprazole (PRILOSEC) 40 MG capsule TAKE 1 CAPSULE(40 MG) BY MOUTH DAILY (Patient taking differently: Take 40 mg by mouth daily.) 90 capsule 0 10/04/2021   pregabalin (LYRICA) 50 MG capsule Take 50 mg by mouth 2 (two) times daily.   10/04/2021   psyllium (METAMUCIL SMOOTH TEXTURE) 28 % packet Take 1 packet by mouth daily as needed (constipation).    10/04/2021   saccharomyces boulardii (FLORASTOR) 250 MG capsule Take 1 capsule (250 mg total) by mouth daily. 30 capsule 0 10/04/2021   Vitamin D, Ergocalciferol, (DRISDOL) 1.25 MG (50000 UNIT) CAPS capsule Take 50,000 Units by mouth every Wednesday.   10/02/2021    Assessment: 62 YOF with newly diagnosed AF w/ RVR. Her CHADS-VASc score is 7. A consult was received to start her on Apixaban  Goal of Therapy:  Monitor platelets by anticoagulation protocol: Yes   Plan:  Discontinued lovenox 40 mg [place] qday (last received 10/07/2021 at 10:45) Started apixaban 5 mg po bid at 22:00 on 9/25 Pharmacy will sign off of consult but continue to follow making recommendations prn.  Thank you for allowing Pharmacy to participate in the care of your patient.  Vaughan Basta BS, PharmD, BCPS Clinical Pharmacist 10/07/2021 6:43 PM  Contact: [phone] after 3 PM  "Be curious, not judgmental..." -Jamal Maes

## 2021-10-07 NOTE — Progress Notes (Signed)
PROGRESS NOTE    Jade Ellison  NUU:[id number] DOB: 05-23-1945 DOA: 10/06/2021 PCP: Bernerd Limbo, MD   Brief Narrative:  76 year old female with past medical history of non-insulin-dependent diabetes mellitus type 2, hypertension, obstructive sleep apnea (CPAP titration study pending), hyperlipidemia, gastroesophageal reflux disease, recurrent urinary tract infections (on supressive therapy with Keflex), diastolic congestive heart failure (Echo 09/2011 EF 55-60% with G1DD) presenting to [facility] emergency department with sudden changes in speech.   History is been obtained from the patient as well as the daughter and son at the bedside.   Patient has been experiencing gradually increasing weakness and unsteady gait since approximately mid August.  Patient and family explained that in sudden stepwise fashion beginning with sudden right lower extremity weakness in mid August prompting her to begin to intermittently use a wheelchair to get around her home.  In the days and weeks that followed patient would have stepwise worsening in her symptoms including a markedly unsteady gait making it almost impossible for her to ever ambulate.  The symptoms continue to persist and this afternoon the daughter reports that the patient suddenly was unable to speak.  After short while patient was finally able to speak but the words that she was using were nonsensical.  Patient and family deny any associated facial droop, change in vision, headache.     Due to these ongoing symptoms EMS was contacted and patient was promptly evaluated and brought into [facility] emergency room for evaluation.  Initial noncontrast CT pending of the brain was unremarkable however follow-up MRI of the brain revealed multiple acute to subacute areas of infarction involving the right frontal operculum as well as the splenium of the corpus callosum with concerns for embolic etiology.  EDP discussed these findings with  Dr. Cheral Marker with neurology who recommended hospitalization for stroke work-up with Dr. Rory Percy to evaluate in consultation later in the evening.  The hospitalist group was then called to assess the patient for admission to the hospital.    Assessment & Plan:   Principal Problem:   Acute ischemic stroke [facility]) Active Problems:   Paroxysmal atrial fibrillation ([place])   Acute cystitis without hematuria   Chronic kidney disease, stage 3a ([place])   Type 2 diabetes mellitus with stage 3a chronic kidney disease ([place])   Essential hypertension   Mixed diabetic hyperlipidemia associated with type 2 diabetes mellitus (HCC)   OSA (obstructive sleep apnea)   GERD (gastroesophageal reflux disease)  Acute ischemic stroke: Multiple areas of acute/subacute infarction on MRI brain are suggestive of a cardioembolic etiology especially in the setting of new diagnosis of atrial fibrillation on admission EKG. seen by neurology.  Started on aspirin 81 mg for now.  Patient would likely need to be started on anticoagulation, timing to be determined by neurology.  MR angiogram unremarkable.  CTA head and neck shows mild-to-moderate atheromatous change about the carotid bifurcations and carotid siphons without hemodynamically significant or correctable stenosis. Diffuse tortuosity of the major arterial vasculature of the head and neck, suggesting chronic underlying hypertensionPatient currently does not have any focal deficit.  Echo shows normal ejection fraction and normal diastolic dysfunction, no PFO.  PT OT evaluation completed they recommend CIR.  LDL 32.  Hemoglobin A1c 7.2.  Patient on atorvastatin.  Newly discovered atrial fibrillation: Rates controlled.  Patient not on any rate control medications.  Anticoagulation per neurology.  CKD stage IIIa: At baseline.  Acute cystitis/UTI: Continue Rocephin and follow culture.  Type 2 diabetes mellitus: Hemoglobin A1c  7.2.  Takes metformin and glipizide at home which I  will hold.  Start on SSI.  Essential hypertension: Allowing permissive hypertension for now.  Treating only when systolic is more than 409 or diastolic more than 811.  OSA: Patient noncompliant with CPAP.  GERD: Continue PPI.  DVT prophylaxis: enoxaparin (LOVENOX) injection 40 mg Start: 10/07/21 1000   Code Status: Full Code  Family Communication: Daughter and son present at bedside.  Plan of care discussed with patient in length and he/she verbalized understanding and agreed with it.  Status is: Inpatient Remains inpatient appropriate because: Needs further evaluation by neurology.   Estimated body mass index is 50.13 kg/m as calculated from the following:   Height as of an earlier encounter on 10/06/21: '5\' 3"'$  (1.6 m).   Weight as of an earlier encounter on 10/06/21: 128.4 kg.    Nutritional Assessment: There is no height or weight on file to calculate BMI.. Seen by dietician.  I agree with the assessment and plan as outlined below: Nutrition Status:        . Skin Assessment: I have examined the patient's skin and I agree with the wound assessment as performed by the wound care RN as outlined below:    Consultants:  Neurology  Procedures:  None  Antimicrobials:  Anti-infectives (From admission, onward)    Start     Dose/Rate Route Frequency Ordered Stop   10/06/21 2300  cefTRIAXone (ROCEPHIN) 1 g in sodium chloride 0.9 % 100 mL IVPB        1 g 200 mL/hr over 30 Minutes Intravenous Every 24 hours 10/06/21 2251           Subjective: Patient seen and examined.  She has no complaints.  Objective: Vitals:   10/07/21 0621 10/07/21 0752 10/07/21 1026 10/07/21 1157  BP:   (!) 155/86   Pulse: 61  68   Resp: 19  (!) 21   Temp:  98.3 F (36.8 C)  98.3 F (36.8 C)  TempSrc:  Oral  Oral  SpO2: 96%  98%     Intake/Output Summary (Last 24 hours) at 10/07/2021 1336 Last data filed at 10/07/2021 1045 Gross per 24 hour  Intake 203 ml  Output --  Net 203 ml    There were no vitals filed for this visit.  Examination:  General exam: Appears calm and comfortable  Respiratory system: Clear to auscultation. Respiratory effort normal. Cardiovascular system: S1 & S2 heard, RRR. No JVD, murmurs, rubs, gallops or clicks. No pedal edema. Gastrointestinal system: Abdomen is nondistended, soft and nontender. No organomegaly or masses felt. Normal bowel sounds heard. Central nervous system: Alert and oriented. No focal neurological deficits. Extremities: Symmetric 5 x 5 power. Skin: No rashes, lesions or ulcers Psychiatry: Judgement and insight appear normal. Mood & affect appropriate.    Data Reviewed: I have personally reviewed following labs and imaging studies  CBC: Recent Labs  Lab 10/06/21 1551 10/07/21 0448  WBC 6.1 5.2  NEUTROABS 4.4 3.2  HGB 11.4* 11.2*  HCT 36.7 36.5  MCV 84.6 84.9  PLT 181 914   Basic Metabolic Panel: Recent Labs  Lab 10/06/21 1551 10/07/21 0448  NA 136 139  K 3.7 3.5  CL 99 100  CO2 26 28  GLUCOSE 124* 128*  BUN 14 13  CREATININE 1.02* 0.99  CALCIUM 8.9 9.2  MG  --  1.1*   GFR: Estimated Creatinine Clearance: 63.2 mL/min (by C-G formula based on SCr of 0.99 mg/dL). Liver Function Tests:  Recent Labs  Lab 10/06/21 1551 10/07/21 0448  AST 21 18  ALT 11 12  ALKPHOS 63 60  BILITOT 0.9 0.8  PROT 6.1* 6.2*  ALBUMIN 3.2* 3.1*   No results for input(s): "LIPASE", "AMYLASE" in the last 168 hours. No results for input(s): "AMMONIA" in the last 168 hours. Coagulation Profile: No results for input(s): "INR", "PROTIME" in the last 168 hours. Cardiac Enzymes: No results for input(s): "CKTOTAL", "CKMB", "CKMBINDEX", "TROPONINI" in the last 168 hours. BNP (last 3 results) No results for input(s): "PROBNP" in the last 8760 hours. HbA1C: Recent Labs    10/07/21 0448  HGBA1C 7.2*   CBG: Recent Labs  Lab 10/06/21 1550 10/07/21 0819 10/07/21 1203  GLUCAP 110* 128* 237*   Lipid Profile: Recent  Labs    10/07/21 0448  CHOL 91  HDL 44  LDLCALC 32  TRIG 77  CHOLHDL 2.1   Thyroid Function Tests: No results for input(s): "TSH", "T4TOTAL", "FREET4", "T3FREE", "THYROIDAB" in the last 72 hours. Anemia Panel: No results for input(s): "VITAMINB12", "FOLATE", "FERRITIN", "TIBC", "IRON", "RETICCTPCT" in the last 72 hours. Sepsis Labs: No results for input(s): "PROCALCITON", "LATICACIDVEN" in the last 168 hours.  No results found for this or any previous visit (from the past 240 hour(s)).   Radiology Studies: ECHOCARDIOGRAM COMPLETE BUBBLE STUDY  Result Date: 10/07/2021    ECHOCARDIOGRAM REPORT   Patient Name:   Jade Ellison Date of Exam: 10/07/2021 Medical Rec #:  [id number]        Height:       63.0 in Accession #:    [id number]       Weight:       283.0 lb Date of Birth:  1945-08-25        BSA:          2.241 m Patient Age:    89 years         BP:           152/94 mmHg Patient Gender: F                HR:           71 bpm. Exam Location:  Inpatient Procedure: 2D Echo, Cardiac Doppler, Color Doppler and Saline Contrast Bubble            Study Indications:    Stroke  History:        Patient has prior history of Echocardiogram examinations, most                 recent 09/14/2011. CHF; Risk Factors:Hypertension, Diabetes, Sleep                 Apnea and Dyslipidemia.  Sonographer:    Eartha Inch Referring Phys: [id number] Vernelle Emerald  Sonographer Comments: Technically difficult study due to poor echo windows. Negative bubble study IMPRESSIONS  1. Left ventricular ejection fraction, by estimation, is 55 to 60%. The left ventricle has normal function. The left ventricle has no regional wall motion abnormalities. Left ventricular diastolic function could not be evaluated.  2. Right ventricular systolic function is mildly reduced. The right ventricular size is normal.  3. The mitral valve is abnormal. Trivial mitral valve regurgitation.  4. The aortic valve was not well visualized. Aortic valve  regurgitation is not visualized.  5. The inferior vena cava is normal in size with greater than 50% respiratory variability, suggesting right atrial pressure of 3 mmHg.  6. Agitated saline contrast bubble study was negative,  with no evidence of any interatrial shunt. Comparison(s): Changes from prior study are noted. 09/14/2011: LVEF 55-60%, negative bubble study. FINDINGS  Left Ventricle: Left ventricular ejection fraction, by estimation, is 55 to 60%. The left ventricle has normal function. The left ventricle has no regional wall motion abnormalities. The left ventricular internal cavity size was normal in size. There is  no left ventricular hypertrophy. Left ventricular diastolic function could not be evaluated due to atrial fibrillation. Left ventricular diastolic function could not be evaluated. Right Ventricle: The right ventricular size is normal. No increase in right ventricular wall thickness. Right ventricular systolic function is mildly reduced. Left Atrium: Left atrial size was normal in size. Right Atrium: Right atrial size was normal in size. Pericardium: There is no evidence of pericardial effusion. Mitral Valve: The mitral valve is abnormal. Mild to moderate mitral annular calcification. Trivial mitral valve regurgitation. MV peak gradient, 6.6 mmHg. The mean mitral valve gradient is 3.0 mmHg. Tricuspid Valve: The tricuspid valve is not well visualized. Tricuspid valve regurgitation is not demonstrated. Aortic Valve: The aortic valve was not well visualized. Aortic valve regurgitation is not visualized. Pulmonic Valve: The pulmonic valve was not well visualized. Pulmonic valve regurgitation is not visualized. Aorta: The aortic root and ascending aorta are structurally normal, with no evidence of dilitation. Venous: The inferior vena cava is normal in size with greater than 50% respiratory variability, suggesting right atrial pressure of 3 mmHg. IAS/Shunts: No atrial level shunt detected by color flow  Doppler. Agitated saline contrast was given intravenously to evaluate for intracardiac shunting. Agitated saline contrast bubble study was negative, with no evidence of any interatrial shunt.  LEFT VENTRICLE PLAX 2D LVIDd:         4.80 cm   Diastology LVIDs:         3.70 cm   LV e' medial:    5.40 cm/s LV PW:         1.00 cm   LV E/e' medial:  23.1 LV IVS:        0.80 cm   LV e' lateral:   6.30 cm/s LVOT diam:     1.70 cm   LV E/e' lateral: 19.8 LVOT Area:     2.27 cm  RIGHT VENTRICLE            IVC RV S prime:     7.80 cm/s  IVC diam: 1.80 cm LEFT ATRIUM             Index        RIGHT ATRIUM           Index LA diam:        3.80 cm 1.70 cm/m   RA Area:     15.10 cm LA Vol (A2C):   46.3 ml 20.66 ml/m  RA Volume:   31.90 ml  14.23 ml/m LA Vol (A4C):   61.9 ml 27.62 ml/m LA Biplane Vol: 56.8 ml 25.35 ml/m   AORTA Ao Root diam: 2.40 cm MITRAL VALVE MV Area (PHT): 3.48 cm     SHUNTS MV Peak grad:  6.6 mmHg     Systemic Diam: 1.70 cm MV Mean grad:  3.0 mmHg MV Vmax:       1.28 m/s MV Vmean:      79.7 cm/s MV Decel Time: 218 msec MV E velocity: 125.00 cm/s Lyman Bishop MD Electronically signed by Lyman Bishop MD Signature Date/Time: 10/07/2021/11:47:07 AM    Final    CT ANGIO HEAD NECK W WO  CM  Result Date: 10/07/2021 CLINICAL DATA:  Follow-up examination for stroke. EXAM: CT ANGIOGRAPHY HEAD AND NECK TECHNIQUE: Multidetector CT imaging of the head and neck was performed using the standard protocol during bolus administration of intravenous contrast. Multiplanar CT image reconstructions and MIPs were obtained to evaluate the vascular anatomy. Carotid stenosis measurements (when applicable) are obtained utilizing NASCET criteria, using the distal internal carotid diameter as the denominator. RADIATION DOSE REDUCTION: This exam was performed according to the departmental dose-optimization program which includes automated exposure control, adjustment of the mA and/or kV according to patient size and/or use of  iterative reconstruction technique. CONTRAST:  58m OMNIPAQUE IOHEXOL 350 MG/ML SOLN COMPARISON:  CT and MRI from earlier the same day. FINDINGS: CTA NECK FINDINGS Aortic arch: Visualized aortic arch normal in caliber with standard 3 vessel morphology. Mild atheromatous change about the arch itself. No stenosis about the origin the great vessels. Right carotid system: Right common and internal carotid arteries are tortuous. Moderate calcified plaque about the right carotid bulb without hemodynamically significant stenosis. No dissection or occlusion. Left carotid system: Left common and internal carotid arteries are tortuous. Mild atheromatous change about the left carotid bulb without hemodynamically significant stenosis. No dissection or occlusion. Vertebral arteries: Both vertebral arteries arise from subclavian arteries. No proximal subclavian artery stenosis. Right vertebral artery dominant. Vertebral arteries patent without stenosis or dissection. Skeleton: No discrete or worrisome osseous lesions. Moderate degenerative spurring noted throughout the visualized cervicothoracic spine. Moderate to advanced osteoarthritic changes present about the C1-2 articulation. Other neck: No other acute soft tissue abnormality within the neck. Few scattered thyroid nodules noted, largest of which measures up to 2.3 cm on the left and is partially calcified. Upper chest: Layering left pleural effusion, partially visualized. No other acute abnormality within the visualized upper chest. Review of the MIP images confirms the above findings CTA HEAD FINDINGS Anterior circulation: Petrous segments patent bilaterally. Atheromatous plaque within the carotid siphons without hemodynamically significant stenosis. A1 segments patent bilaterally. Normal anterior communicating artery complex. Anterior cerebral arteries widely patent without stenosis. No M1 stenosis or occlusion. No proximal MCA branch occlusion or stenosis. Distal MCA  branches perfused and symmetric. Posterior circulation: Atheromatous plaque within the dominant proximal right V4 segment with associated mild stenosis (series 7, image 142). Hypoplastic left V4 segment widely patent. Left PICA patent. Right PICA not well seen. Basilar patent to its distal aspect without stenosis. Superior cerebral arteries patent bilaterally. Left PCA supplied via a hypoplastic left P1 segment and robust left posterior communicating artery. Fetal type origin right PCA. Both PCAs patent to their distal aspects without stenosis. Venous sinuses: Patent allowing for timing the contrast bolus. Anatomic variants: As above.  No aneurysm. Review of the MIP images confirms the above findings IMPRESSION: 1. Negative CTA for large vessel occlusion or other emergent finding. 2. Mild-to-moderate atheromatous change about the carotid bifurcations and carotid siphons without hemodynamically significant or correctable stenosis. 3. Diffuse tortuosity of the major arterial vasculature of the head and neck, suggesting chronic underlying hypertension. 4. Layering left pleural effusion, partially visualized. 5. 2.3 cm left thyroid nodule, indeterminate. Further evaluation with dedicated thyroid ultrasound recommended. This could be performed on a nonemergent outpatient basis (Ref: J Am Coll Radiol. 2015 Feb;12(2): 143-50). Electronically Signed   By: BJeannine BogaM.D.   On: 10/07/2021 00:24   MR ANGIO HEAD WO CONTRAST  Result Date: 10/06/2021 CLINICAL DATA:  Stroke/TIA. Abnormal MR head with cortical infarcts of the right frontal operculum and focal infarct involving  the splenium of the corpus callosum. EXAM: MRA HEAD WITHOUT CONTRAST TECHNIQUE: Angiographic images of the Circle of Willis were acquired using MRA technique without intravenous contrast. COMPARISON:  MR head without contrast 10/06/2021 FINDINGS: Anterior circulation: Internal carotid arteries are within normal limits the high cervical segments  through the ICA termini bilaterally. The A1 and M1 segments are normal. No definite anterior communicating artery is present. MCA bifurcations are within normal limits. The ACA and MCA branch vessels are normal. Posterior circulation: Right vertebral artery is dominant vessel. Vertebrobasilar junction basilar artery are. Fetal type posterior cerebral arteries are present bilaterally. Small P1 segments are noted. PCA branch vessels are within normal limits bilaterally. Anatomic variants: Bilateral fetal type posterior cerebral arteries. Other: None. IMPRESSION: 1. Normal MRA circle-of-Willis. No significant proximal stenosis, aneurysm, or branch vessel occlusion. The up Electronically Signed   By: San Morelle M.D.   On: 10/06/2021 18:54   MR BRAIN WO CONTRAST  Result Date: 10/06/2021 CLINICAL DATA:  TIA. Sudden onset of confusion at 1:30 this afternoon. EXAM: MRI HEAD WITHOUT CONTRAST TECHNIQUE: Multiplanar, multiecho pulse sequences of the brain and surrounding structures were obtained without intravenous contrast. COMPARISON:  CT head without contrast 10/06/2021. FINDINGS: Brain: Diffusion-weighted images demonstrate focal area of restricted cortical diffusion in the right frontal operculum punctate focus of restricted diffusion is also present within the splenium of the corpus callosum. Subtle FLAIR signal is present both locations. Moderate generalized atrophy and diffuse white matter changes are present otherwise. White matter changes extend into the brainstem. Cerebellum is unremarkable. Vascular: Flow is present in the major intracranial arteries. The craniocervical junction is normal. Upper cervical spine is within normal limits. Marrow signal is unremarkable. Skull and upper cervical spine: Degenerative changes are present in the upper cervical spine. Prominent soft tissue is present posterior to the dens. Craniocervical junction is otherwise within normal limits. Sinuses/Orbits: Bilateral  mastoid effusions are present. No obstructing nasopharyngeal lesion is present. Paranasal sinuses otherwise clear. Bilateral lens replacements are noted. Globes and orbits are otherwise unremarkable. IMPRESSION: 1. Acute/subacute cortical nonhemorrhagic infarct involving the right frontal operculum. 2. Acute subacute punctate nonhemorrhagic infarct in the splenium of the corpus callosum. 3. Question central embolic etiology. 4. Moderate generalized atrophy and diffuse white matter disease likely reflects the sequela of chronic microvascular ischemia. 5. Bilateral mastoid effusions. No obstructing nasopharyngeal lesion is present. Electronically Signed   By: San Morelle M.D.   On: 10/06/2021 18:48   CT Head Wo Contrast  Result Date: 10/06/2021 CLINICAL DATA:  Altered mental status. EXAM: CT HEAD WITHOUT CONTRAST TECHNIQUE: Contiguous axial images were obtained from the base of the skull through the vertex without intravenous contrast. RADIATION DOSE REDUCTION: This exam was performed according to the departmental dose-optimization program which includes automated exposure control, adjustment of the mA and/or kV according to patient size and/or use of iterative reconstruction technique. COMPARISON:  02/08/2012 FINDINGS: Brain: No evidence of intracranial hemorrhage, acute infarction, hydrocephalus, extra-axial collection, or mass lesion/mass effect. Mild cerebral atrophy and moderate chronic small vessel disease shows progression since prior exam. Vascular:  No hyperdense vessel or other acute findings. Skull: No evidence of fracture or other significant bone abnormality. Sinuses/Orbits:  No acute findings. Other: None. IMPRESSION: No acute intracranial abnormality. Cerebral atrophy and chronic small vessel disease. Electronically Signed   By: Marlaine Hind M.D.   On: 10/06/2021 17:24    Scheduled Meds:  aspirin EC  81 mg Oral Daily   atorvastatin  20 mg Oral Daily   DULoxetine  60 mg Oral Daily    enoxaparin (LOVENOX) injection  40 mg Subcutaneous Q24H   insulin aspart  0-15 Units Subcutaneous TID AC & HS   metoprolol tartrate  50 mg Oral BID   pantoprazole  40 mg Oral Daily   pregabalin  50 mg Oral BID   sodium chloride flush  3 mL Intravenous Q12H   Continuous Infusions:  cefTRIAXone (ROCEPHIN)  IV Stopped (10/07/21 0028)     LOS: 0 days   Darliss Cheney, MD [facility]  10/07/2021, 1:36 PM   *Please note that this is a verbal dictation therefore any spelling or grammatical errors are due to the "[place] [employer]" system interpretation.  Please page via [place] and do not message via secure chat for urgent patient care matters. Secure chat can be used for non urgent patient care matters.  How to contact the [facility] Attending or Consulting provider [place] or covering provider during after hours Secretary, for this patient?  Check the care team in [facility] and look for a) attending/consulting [facility] provider listed and b) the [facility] team listed. Page or secure chat [phone]. Log into [email or web address] and use 's universal password to access. If you do not have the password, please contact the hospital operator. Locate the [facility] provider you are looking for under [facility] and page to a number that you can be directly reached. If you still have difficulty reaching the provider, please page the [facility] (Director on Call) for the Hospitalists listed on amion for assistance.

## 2021-10-07 NOTE — Progress Notes (Signed)
  Echocardiogram 2D Echocardiogram has been performed.  Jade Ellison 10/07/2021, 10:21 AM

## 2021-10-08 ENCOUNTER — Telehealth (HOSPITAL_COMMUNITY): Payer: Self-pay

## 2021-10-08 ENCOUNTER — Other Ambulatory Visit (HOSPITAL_COMMUNITY): Payer: Self-pay

## 2021-10-08 ENCOUNTER — Telehealth: Payer: Self-pay | Admitting: Physician Assistant

## 2021-10-08 DIAGNOSIS — I639 Cerebral infarction, unspecified: Secondary | ICD-10-CM | POA: Diagnosis not present

## 2021-10-08 DIAGNOSIS — I4891 Unspecified atrial fibrillation: Secondary | ICD-10-CM | POA: Diagnosis not present

## 2021-10-08 LAB — TSH: TSH: 1.32 u[IU]/mL (ref 0.350–4.500)

## 2021-10-08 LAB — GLUCOSE, CAPILLARY
Glucose-Capillary: 160 mg/dL — ABNORMAL HIGH (ref 70–99)
Glucose-Capillary: 230 mg/dL — ABNORMAL HIGH (ref 70–99)

## 2021-10-08 MED ORDER — APIXABAN 5 MG PO TABS
5.0000 mg | ORAL_TABLET | Freq: Two times a day (BID) | ORAL | 0 refills | Status: DC
  Filled 2021-10-08: qty 60, 30d supply, fill #0

## 2021-10-08 MED ORDER — ORAL CARE MOUTH RINSE: 15.0000 mL | OROMUCOSAL | Status: DC | PRN

## 2021-10-08 MED ORDER — SODIUM CHLORIDE 0.9 % IV SOLN
1.0000 g | INTRAVENOUS | Status: DC
  Administered 2021-10-08: 1 g via INTRAVENOUS
  Filled 2021-10-08: qty 10

## 2021-10-08 NOTE — Progress Notes (Signed)
Cardiology Progress Note  Patient ID: Jade Ellison: [id number] DOB: 1945-01-16 Date of Encounter: 10/08/2021  Primary Cardiologist: None  Subjective   Chief Complaint: None.   HPI: Remains in rate controlled A-fib.  Started on Eliquis last night.  No issues.  ROS:  All other ROS reviewed and negative. Pertinent positives noted in the HPI.     Inpatient Medications  Scheduled Meds:  apixaban  5 mg Oral BID   atorvastatin  20 mg Oral Daily   DULoxetine  60 mg Oral Daily   insulin aspart  0-15 Units Subcutaneous TID AC & HS   latanoprost  1 drop Both Eyes QHS   metoprolol tartrate  50 mg Oral BID   pantoprazole  40 mg Oral Daily   pregabalin  50 mg Oral BID   sodium chloride flush  3 mL Intravenous Q12H   Continuous Infusions:  cefTRIAXone (ROCEPHIN)  IV 1 g (10/07/21 2341)   PRN Meds: acetaminophen **OR** acetaminophen, albuterol, hydrALAZINE, ondansetron **OR** ondansetron (ZOFRAN) IV, mouth rinse, polyethylene glycol   Vital Signs   Vitals:   10/07/21 2255 10/07/21 2350 10/08/21 0411 10/08/21 0849  BP: 133/82  127/70 (!) 150/93  Pulse: 71 78 66 69  Resp: '16 16 12 19  '$ Temp: 97.9 F (36.6 C)  (!) 97.5 F (36.4 C) 97.8 F (36.6 C)  TempSrc:   Oral Oral  SpO2: 98% 98% 99% 98%    Intake/Output Summary (Last 24 hours) at 10/08/2021 1034 Last data filed at 10/08/2021 0854 Gross per 24 hour  Intake 100 ml  Output 650 ml  Net -550 ml      10/06/2021    5:20 PM 11/07/2019   10:27 AM 03/30/2019    1:53 PM  Last 3 Weights  Weight (lbs) 283 lb 283 lb 288 lb  Weight (kg) 128.368 kg 128.368 kg 130.636 kg      Telemetry  Overnight telemetry shows Af[employer] 60s, which I personally reviewed.   Physical Exam   Vitals:   10/07/21 2255 10/07/21 2350 10/08/21 0411 10/08/21 0849  BP: 133/82  127/70 (!) 150/93  Pulse: 71 78 66 69  Resp: '16 16 12 19  '$ Temp: 97.9 F (36.6 C)  (!) 97.5 F (36.4 C) 97.8 F (36.6 C)  TempSrc:   Oral Oral  SpO2: 98% 98% 99% 98%     Intake/Output Summary (Last 24 hours) at 10/08/2021 1034 Last data filed at 10/08/2021 0854 Gross per 24 hour  Intake 100 ml  Output 650 ml  Net -550 ml       10/06/2021    5:20 PM 11/07/2019   10:27 AM 03/30/2019    1:53 PM  Last 3 Weights  Weight (lbs) 283 lb 283 lb 288 lb  Weight (kg) 128.368 kg 128.368 kg 130.636 kg    There is no height or weight on file to calculate BMI.  General: Well nourished, well developed, in no acute distress Head: Atraumatic, normal size  Eyes: PEERLA, EOMI  Neck: Supple, no JVD Endocrine: No thryomegaly Cardiac: Normal S1, S2; irregular rhythm  Lungs: Clear to auscultation bilaterally, no wheezing, rhonchi or rales  Abd: Soft, nontender, no hepatomegaly  Ext: No edema, pulses 2+ Musculoskeletal: No deformities, BUE and BLE strength normal and equal Skin: Warm and dry, no rashes   Neuro: Alert and oriented to person, place, time, and situation, CNII-XII grossly intact, no focal deficits  Psych: Normal mood and affect   Labs  High Sensitivity Troponin:   Recent Labs  Lab 10/06/21 1551 10/06/21 2147  TROPONINIHS 17 17     Cardiac EnzymesNo results for input(s): "TROPONINI" in the last 168 hours. No results for input(s): "TROPIPOC" in the last 168 hours.  Chemistry Recent Labs  Lab 10/06/21 1551 10/07/21 0448  NA 136 139  K 3.7 3.5  CL 99 100  CO2 26 28  GLUCOSE 124* 128*  BUN 14 13  CREATININE 1.02* 0.99  CALCIUM 8.9 9.2  PROT 6.1* 6.2*  ALBUMIN 3.2* 3.1*  AST 21 18  ALT 11 12  ALKPHOS 63 60  BILITOT 0.9 0.8  GFRNONAA 57* 59*  ANIONGAP 11 11    Hematology Recent Labs  Lab 10/06/21 1551 10/07/21 0448  WBC 6.1 5.2  RBC 4.34 4.30  HGB 11.4* 11.2*  HCT 36.7 36.5  MCV 84.6 84.9  MCH 26.3 26.0  MCHC 31.1 30.7  RDW 14.8 15.0  PLT 181 182   BNP Recent Labs  Lab 10/06/21 1541  BNP 488.3*    DDimer No results for input(s): "DDIMER" in the last 168 hours.   Radiology  ECHOCARDIOGRAM COMPLETE BUBBLE STUDY  Result  Date: 10/07/2021    ECHOCARDIOGRAM REPORT   Patient Name:   Jade Ellison Date of Exam: 10/07/2021 Medical Rec #:  [id number]        Height:       63.0 in Accession #:    [id number]       Weight:       283.0 lb Date of Birth:  1945/12/10        BSA:          2.241 m Patient Age:    76 years         BP:           152/94 mmHg Patient Gender: F                HR:           71 bpm. Exam Location:  Inpatient Procedure: 2D Echo, Cardiac Doppler, Color Doppler and Saline Contrast Bubble            Study Indications:    Stroke  History:        Patient has prior history of Echocardiogram examinations, most                 recent 09/14/2011. CHF; Risk Factors:Hypertension, Diabetes, Sleep                 Apnea and Dyslipidemia.  Sonographer:    Eartha Inch Referring Phys: [id number] Vernelle Emerald  Sonographer Comments: Technically difficult study due to poor echo windows. Negative bubble study IMPRESSIONS  1. Left ventricular ejection fraction, by estimation, is 55 to 60%. The left ventricle has normal function. The left ventricle has no regional wall motion abnormalities. Left ventricular diastolic function could not be evaluated.  2. Right ventricular systolic function is mildly reduced. The right ventricular size is normal.  3. The mitral valve is abnormal. Trivial mitral valve regurgitation.  4. The aortic valve was not well visualized. Aortic valve regurgitation is not visualized.  5. The inferior vena cava is normal in size with greater than 50% respiratory variability, suggesting right atrial pressure of 3 mmHg.  6. Agitated saline contrast bubble study was negative, with no evidence of any interatrial shunt. Comparison(s): Changes from prior study are noted. 09/14/2011: LVEF 55-60%, negative bubble study. FINDINGS  Left Ventricle: Left ventricular ejection fraction, by estimation, is 55 to 60%. The  left ventricle has normal function. The left ventricle has no regional wall motion abnormalities. The left ventricular  internal cavity size was normal in size. There is  no left ventricular hypertrophy. Left ventricular diastolic function could not be evaluated due to atrial fibrillation. Left ventricular diastolic function could not be evaluated. Right Ventricle: The right ventricular size is normal. No increase in right ventricular wall thickness. Right ventricular systolic function is mildly reduced. Left Atrium: Left atrial size was normal in size. Right Atrium: Right atrial size was normal in size. Pericardium: There is no evidence of pericardial effusion. Mitral Valve: The mitral valve is abnormal. Mild to moderate mitral annular calcification. Trivial mitral valve regurgitation. MV peak gradient, 6.6 mmHg. The mean mitral valve gradient is 3.0 mmHg. Tricuspid Valve: The tricuspid valve is not well visualized. Tricuspid valve regurgitation is not demonstrated. Aortic Valve: The aortic valve was not well visualized. Aortic valve regurgitation is not visualized. Pulmonic Valve: The pulmonic valve was not well visualized. Pulmonic valve regurgitation is not visualized. Aorta: The aortic root and ascending aorta are structurally normal, with no evidence of dilitation. Venous: The inferior vena cava is normal in size with greater than 50% respiratory variability, suggesting right atrial pressure of 3 mmHg. IAS/Shunts: No atrial level shunt detected by color flow Doppler. Agitated saline contrast was given intravenously to evaluate for intracardiac shunting. Agitated saline contrast bubble study was negative, with no evidence of any interatrial shunt.  LEFT VENTRICLE PLAX 2D LVIDd:         4.80 cm   Diastology LVIDs:         3.70 cm   LV e' medial:    5.40 cm/s LV PW:         1.00 cm   LV E/e' medial:  23.1 LV IVS:        0.80 cm   LV e' lateral:   6.30 cm/s LVOT diam:     1.70 cm   LV E/e' lateral: 19.8 LVOT Area:     2.27 cm  RIGHT VENTRICLE            IVC RV S prime:     7.80 cm/s  IVC diam: 1.80 cm LEFT ATRIUM             Index         RIGHT ATRIUM           Index LA diam:        3.80 cm 1.70 cm/m   RA Area:     15.10 cm LA Vol (A2C):   46.3 ml 20.66 ml/m  RA Volume:   31.90 ml  14.23 ml/m LA Vol (A4C):   61.9 ml 27.62 ml/m LA Biplane Vol: 56.8 ml 25.35 ml/m   AORTA Ao Root diam: 2.40 cm MITRAL VALVE MV Area (PHT): 3.48 cm     SHUNTS MV Peak grad:  6.6 mmHg     Systemic Diam: 1.70 cm MV Mean grad:  3.0 mmHg MV Vmax:       1.28 m/s MV Vmean:      79.7 cm/s MV Decel Time: 218 msec MV E velocity: 125.00 cm/s Lyman Bishop MD Electronically signed by Lyman Bishop MD Signature Date/Time: 10/07/2021/11:47:07 AM    Final    CT ANGIO HEAD NECK W WO CM  Result Date: 10/07/2021 CLINICAL DATA:  Follow-up examination for stroke. EXAM: CT ANGIOGRAPHY HEAD AND NECK TECHNIQUE: Multidetector CT imaging of the head and neck was performed using the standard protocol during  bolus administration of intravenous contrast. Multiplanar CT image reconstructions and MIPs were obtained to evaluate the vascular anatomy. Carotid stenosis measurements (when applicable) are obtained utilizing NASCET criteria, using the distal internal carotid diameter as the denominator. RADIATION DOSE REDUCTION: This exam was performed according to the departmental dose-optimization program which includes automated exposure control, adjustment of the mA and/or kV according to patient size and/or use of iterative reconstruction technique. CONTRAST:  52m OMNIPAQUE IOHEXOL 350 MG/ML SOLN COMPARISON:  CT and MRI from earlier the same day. FINDINGS: CTA NECK FINDINGS Aortic arch: Visualized aortic arch normal in caliber with standard 3 vessel morphology. Mild atheromatous change about the arch itself. No stenosis about the origin the great vessels. Right carotid system: Right common and internal carotid arteries are tortuous. Moderate calcified plaque about the right carotid bulb without hemodynamically significant stenosis. No dissection or occlusion. Left carotid system: Left  common and internal carotid arteries are tortuous. Mild atheromatous change about the left carotid bulb without hemodynamically significant stenosis. No dissection or occlusion. Vertebral arteries: Both vertebral arteries arise from subclavian arteries. No proximal subclavian artery stenosis. Right vertebral artery dominant. Vertebral arteries patent without stenosis or dissection. Skeleton: No discrete or worrisome osseous lesions. Moderate degenerative spurring noted throughout the visualized cervicothoracic spine. Moderate to advanced osteoarthritic changes present about the C1-2 articulation. Other neck: No other acute soft tissue abnormality within the neck. Few scattered thyroid nodules noted, largest of which measures up to 2.3 cm on the left and is partially calcified. Upper chest: Layering left pleural effusion, partially visualized. No other acute abnormality within the visualized upper chest. Review of the MIP images confirms the above findings CTA HEAD FINDINGS Anterior circulation: Petrous segments patent bilaterally. Atheromatous plaque within the carotid siphons without hemodynamically significant stenosis. A1 segments patent bilaterally. Normal anterior communicating artery complex. Anterior cerebral arteries widely patent without stenosis. No M1 stenosis or occlusion. No proximal MCA branch occlusion or stenosis. Distal MCA branches perfused and symmetric. Posterior circulation: Atheromatous plaque within the dominant proximal right V4 segment with associated mild stenosis (series 7, image 142). Hypoplastic left V4 segment widely patent. Left PICA patent. Right PICA not well seen. Basilar patent to its distal aspect without stenosis. Superior cerebral arteries patent bilaterally. Left PCA supplied via a hypoplastic left P1 segment and robust left posterior communicating artery. Fetal type origin right PCA. Both PCAs patent to their distal aspects without stenosis. Venous sinuses: Patent allowing for  timing the contrast bolus. Anatomic variants: As above.  No aneurysm. Review of the MIP images confirms the above findings IMPRESSION: 1. Negative CTA for large vessel occlusion or other emergent finding. 2. Mild-to-moderate atheromatous change about the carotid bifurcations and carotid siphons without hemodynamically significant or correctable stenosis. 3. Diffuse tortuosity of the major arterial vasculature of the head and neck, suggesting chronic underlying hypertension. 4. Layering left pleural effusion, partially visualized. 5. 2.3 cm left thyroid nodule, indeterminate. Further evaluation with dedicated thyroid ultrasound recommended. This could be performed on a nonemergent outpatient basis (Ref: J Am Coll Radiol. 2015 Feb;12(2): 143-50). Electronically Signed   By: BJeannine BogaM.D.   On: 10/07/2021 00:24   MR ANGIO HEAD WO CONTRAST  Result Date: 10/06/2021 CLINICAL DATA:  Stroke/TIA. Abnormal MR head with cortical infarcts of the right frontal operculum and focal infarct involving the splenium of the corpus callosum. EXAM: MRA HEAD WITHOUT CONTRAST TECHNIQUE: Angiographic images of the Circle of Willis were acquired using MRA technique without intravenous contrast. COMPARISON:  MR head without contrast 10/06/2021  FINDINGS: Anterior circulation: Internal carotid arteries are within normal limits the high cervical segments through the ICA termini bilaterally. The A1 and M1 segments are normal. No definite anterior communicating artery is present. MCA bifurcations are within normal limits. The ACA and MCA branch vessels are normal. Posterior circulation: Right vertebral artery is dominant vessel. Vertebrobasilar junction basilar artery are. Fetal type posterior cerebral arteries are present bilaterally. Small P1 segments are noted. PCA branch vessels are within normal limits bilaterally. Anatomic variants: Bilateral fetal type posterior cerebral arteries. Other: None. IMPRESSION: 1. Normal MRA  circle-of-Willis. No significant proximal stenosis, aneurysm, or branch vessel occlusion. The up Electronically Signed   By: San Morelle M.D.   On: 10/06/2021 18:54   MR BRAIN WO CONTRAST  Result Date: 10/06/2021 CLINICAL DATA:  TIA. Sudden onset of confusion at 1:30 this afternoon. EXAM: MRI HEAD WITHOUT CONTRAST TECHNIQUE: Multiplanar, multiecho pulse sequences of the brain and surrounding structures were obtained without intravenous contrast. COMPARISON:  CT head without contrast 10/06/2021. FINDINGS: Brain: Diffusion-weighted images demonstrate focal area of restricted cortical diffusion in the right frontal operculum punctate focus of restricted diffusion is also present within the splenium of the corpus callosum. Subtle FLAIR signal is present both locations. Moderate generalized atrophy and diffuse white matter changes are present otherwise. White matter changes extend into the brainstem. Cerebellum is unremarkable. Vascular: Flow is present in the major intracranial arteries. The craniocervical junction is normal. Upper cervical spine is within normal limits. Marrow signal is unremarkable. Skull and upper cervical spine: Degenerative changes are present in the upper cervical spine. Prominent soft tissue is present posterior to the dens. Craniocervical junction is otherwise within normal limits. Sinuses/Orbits: Bilateral mastoid effusions are present. No obstructing nasopharyngeal lesion is present. Paranasal sinuses otherwise clear. Bilateral lens replacements are noted. Globes and orbits are otherwise unremarkable. IMPRESSION: 1. Acute/subacute cortical nonhemorrhagic infarct involving the right frontal operculum. 2. Acute subacute punctate nonhemorrhagic infarct in the splenium of the corpus callosum. 3. Question central embolic etiology. 4. Moderate generalized atrophy and diffuse white matter disease likely reflects the sequela of chronic microvascular ischemia. 5. Bilateral mastoid  effusions. No obstructing nasopharyngeal lesion is present. Electronically Signed   By: San Morelle M.D.   On: 10/06/2021 18:48   CT Head Wo Contrast  Result Date: 10/06/2021 CLINICAL DATA:  Altered mental status. EXAM: CT HEAD WITHOUT CONTRAST TECHNIQUE: Contiguous axial images were obtained from the base of the skull through the vertex without intravenous contrast. RADIATION DOSE REDUCTION: This exam was performed according to the departmental dose-optimization program which includes automated exposure control, adjustment of the mA and/or kV according to patient size and/or use of iterative reconstruction technique. COMPARISON:  02/08/2012 FINDINGS: Brain: No evidence of intracranial hemorrhage, acute infarction, hydrocephalus, extra-axial collection, or mass lesion/mass effect. Mild cerebral atrophy and moderate chronic small vessel disease shows progression since prior exam. Vascular:  No hyperdense vessel or other acute findings. Skull: No evidence of fracture or other significant bone abnormality. Sinuses/Orbits:  No acute findings. Other: None. IMPRESSION: No acute intracranial abnormality. Cerebral atrophy and chronic small vessel disease. Electronically Signed   By: Marlaine Hind M.D.   On: 10/06/2021 17:24    Cardiac Studies  TTE 10/07/2021  1. Left ventricular ejection fraction, by estimation, is 55 to 60%. The  left ventricle has normal function. The left ventricle has no regional  wall motion abnormalities. Left ventricular diastolic function could not  be evaluated.   2. Right ventricular systolic function is mildly reduced. The right  ventricular size is normal.   3. The mitral valve is abnormal. Trivial mitral valve regurgitation.   4. The aortic valve was not well visualized. Aortic valve regurgitation  is not visualized.   5. The inferior vena cava is normal in size with greater than 50%  respiratory variability, suggesting right atrial pressure of 3 mmHg.   6. Agitated  saline contrast bubble study was negative, with no evidence  of any interatrial shunt.   Patient Profile  76 year old female with history of obesity, diabetes, OSA not treated, hypertension, hyperlipidemia who was admitted on 10/06/2021 with aphasia.  MRI confirmed stroke.  New diagnosis of atrial fibrillation.  Assessment & Plan   #New onset atrial fibrillation #Stroke -Admitted with new onset A-fib and aphasia.  Symptoms have improved.  Echo shows normal LV function.  Normal thyroid study.  She has untreated sleep apnea which is likely the source of her A-fib.  Also is morbidly obese. -Would recommend rate control strategy.  No symptoms from her A-fib. -Start Eliquis 5 mg twice daily.  Tolerating this well.  [place] will sign off.   Medication Recommendations: As above Other recommendations (labs, testing, etc): None Follow up as an outpatient: We will arrange outpatient follow-up in 6 to 8 weeks  For questions or updates, please contact [place] Please consult [email or web address] for contact info under    Signed, [place] T. Audie Box, MD, [place]  10/08/2021 10:34 AM

## 2021-10-08 NOTE — TOC Transition Note (Signed)
Transition of Care [facility]) - CM/SW Discharge Note   Patient Details  Name: Jade Ellison MRN: [id number] Date of Birth: 1945/07/19  [facility] Care [facility]) CM/SW Contact:  Pollie Friar, RN Phone Number: 10/08/2021, 3:31 PM   Clinical Narrative:    Pt and family have decided for her to d/c home with home health services instead of CIR.  They asked to use [facility] home health. Cm called [facility] and made referral. Information on the AVS. Daughter asking for BSC/ walker for home. Pt has received these items within the last year so is not able to receive them again under her insurance. Daughter plans to purchase these items outside the hospital setting.  Family to oversee her medications at home.  Family providing transport home.    Final next level of care: Home w Home Health Services Barriers to Discharge: No Barriers Identified   Patient Goals and CMS Choice   CMS Medicare.gov Compare Post Acute Care list provided to:: Patient Represenative (must comment) Choice offered to / list presented to : Adult Children, Patient  Discharge Placement                       Discharge Plan and Services                          HH Arranged: PT, OT HH Agency: [facility] Date [place]: 10/08/21   Representative spoke with at [place]: Called main number  Social Determinants of Health (SDOH) Interventions     Readmission Risk Interventions     No data to display

## 2021-10-08 NOTE — Plan of Care (Signed)

## 2021-10-08 NOTE — Telephone Encounter (Signed)
Irven Shelling, Dr Audie Box asked me to help get this pt an appt with him in 6-8 weeks for post hospital f/u. Trish could not find anything. Can you help? If no availability, seems appropriate to schedule with APP. Patient in process of being discharged so please inform her of appt info when scheduled. Thanks!

## 2021-10-08 NOTE — Progress Notes (Signed)
Pt requires a bedside commode as she has difficulty making it in a timely manner to the bathroom at home.  Pt will require a bariatric size bedside commode due to her body habitus.

## 2021-10-08 NOTE — Progress Notes (Signed)
Physical Therapy Treatment Patient Details Name: Jade Ellison MRN: [id number] DOB: 06-17-1945 Today's Date: 10/08/2021   History of Present Illness Pt is a 76 y/o female who presented with speech difficulties and progressive mobility decline. 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 PMH: anxiety, CAD, Barrett esophagus, depression, DJD L knee, glaucoma, hernia, HTN, CHF, obesity, OA R hip, PSVT, DM2    PT Comments    Able to get pt standing 4 times with family assisting. Family has been assisting pt at home and plan to continue providing care at home. Pt will need rolling walker and bariatric bsc for home. Did well with Denna Haggard and daughter looking in to ordering something similar for home on her own. She understands not covered by insurance. Family feels they will be able to assist pt home via car.   Recommendations for follow up therapy are one component of a multi-disciplinary discharge planning process, led by the attending physician.  Recommendations may be updated based on patient status, additional functional criteria and insurance authorization.  Follow Up Recommendations  Home health PT     Assistance Recommended at Discharge Frequent or constant Supervision/Assistance  Patient can return home with the following Two people to help with walking and/or transfers;Two people to help with bathing/dressing/bathroom;Assistance with cooking/housework;Help with stairs or ramp for entrance;Assist for transportation   Equipment Recommendations  Rolling walker (2 wheels);BSC/3in1    Recommendations for Other Services       Precautions / Restrictions Precautions Precautions: Fall Restrictions Weight Bearing Restrictions: No     Mobility  Bed Mobility Overal bed mobility: Needs Assistance Bed Mobility: Supine to Sit, Sit to Supine     Supine to sit: Mod assist, HOB elevated Sit to supine: Max assist, +2 for safety/equipment    General bed mobility comments: assist to bring RLE off of bed, elevate trunk into sitting and bring hips to EOB. Assist to bring legs back up into bed    Transfers Overall transfer level: Needs assistance Equipment used: Rolling walker (2 wheels), Ambulation equipment used Transfers: Sit to/from Stand Sit to Stand: +2 physical assistance, Mod assist, From elevated surface           General transfer comment: Elevated surface of bed ~6-8". Stood x 2 with walker with assist to bring hips up. Pt's lt knee with limited flexion so unable to get left foot underneath her until she is standing. Used Stedy on third stand from bed. Stood from elevated surface of Stedy with min assist    Ambulation/Gait                   Stairs             Wheelchair Mobility    Modified Rankin (Stroke Patients Only)       Balance Overall balance assessment: Needs assistance Sitting-balance support: Feet supported, Bilateral upper extremity supported Sitting balance-Leahy Scale: Poor Sitting balance - Comments: UE Support                                    Cognition Arousal/Alertness: Awake/alert Behavior During Therapy: WFL for tasks assessed/performed Overall Cognitive Status: Within Functional Limits for tasks assessed  Exercises      General Comments        Pertinent Vitals/Pain Pain Assessment Pain Assessment: No/denies pain    Home Living                          Prior Function            PT Goals (current goals can now be found in the care plan section) Acute Rehab PT Goals Patient Stated Goal: to be independent Progress towards PT goals: Progressing toward goals    Frequency    Min 4X/week      PT Plan Discharge plan needs to be updated    Co-evaluation              AM-PAC PT "6 Clicks" Mobility   Outcome Measure  Help needed turning from your back to your  side while in a flat bed without using bedrails?: A Lot Help needed moving from lying on your back to sitting on the side of a flat bed without using bedrails?: A Lot Help needed moving to and from a bed to a chair (including a wheelchair)?: Total Help needed standing up from a chair using your arms (e.g., wheelchair or bedside chair)?: Total Help needed to walk in hospital room?: Total Help needed climbing 3-5 steps with a railing? : Total 6 Click Score: 8    End of Session Equipment Utilized During Treatment: Gait belt Activity Tolerance: Patient tolerated treatment well Patient left: in bed;with call bell/phone within reach;with family/visitor present Nurse Communication: Mobility status;Need for lift equipment PT Visit Diagnosis: Unsteadiness on feet (R26.81);Muscle weakness (generalized) (M62.81);Difficulty in walking, not elsewhere classified (R26.2)     Time: 8022-3361 PT Time Calculation (min) (ACUTE ONLY): 37 min  Charges:  $Therapeutic Activity: 23-37 mins                     [place] 10/08/2021, 2:22 PM

## 2021-10-08 NOTE — Discharge Summary (Addendum)
STROKE TEAM PROGRESS NOTE   INTERVAL HISTORY Patient evaluated at bedside, family is present. Feels well, no acute complaints. Eliquis 5 mg BID has been started for new-onset a-fib, per cardiology recommendations.   Vitals:   10/07/21 2350 10/08/21 0411 10/08/21 0849 10/08/21 1140  BP:  127/70 (!) 150/93 (!) 145/84  Pulse: 78 66 69 71  Resp: '16 12 19 20  '$ Temp:  (!) 97.5 F (36.4 C) 97.8 F (36.6 C) 98.4 F (36.9 C)  TempSrc:  Oral Oral Oral  SpO2: 98% 99% 98% 97%   CBC:  Recent Labs  Lab 10/06/21 1551 10/07/21 0448  WBC 6.1 5.2  NEUTROABS 4.4 3.2  HGB 11.4* 11.2*  HCT 36.7 36.5  MCV 84.6 84.9  PLT 181 086   Basic Metabolic Panel:  Recent Labs  Lab 10/06/21 1551 10/07/21 0448  NA 136 139  K 3.7 3.5  CL 99 100  CO2 26 28  GLUCOSE 124* 128*  BUN 14 13  CREATININE 1.02* 0.99  CALCIUM 8.9 9.2  MG  --  1.1*   Lipid Panel:  Recent Labs  Lab 10/07/21 0448  CHOL 91  TRIG 77  HDL 44  CHOLHDL 2.1  VLDL 15  LDLCALC 32   HgbA1c:  Recent Labs  Lab 10/07/21 0448  HGBA1C 7.2*   Urine Drug Screen:  Recent Labs  Lab 10/06/21 2030  LABOPIA NONE DETECTED  COCAINSCRNUR NONE DETECTED  LABBENZ NONE DETECTED  AMPHETMU NONE DETECTED  THCU NONE DETECTED  LABBARB NONE DETECTED    Alcohol Level No results for input(s): "ETH" in the last 168 hours.  IMAGING past 24 hours No results found.  --PHYSICAL EXAM-- Constitutional: Overweight, elderly African-American lady, no acute distress  HEENT: Normocephalic, Normal conjunctiva, anicteric. Hearing grossly intact. No nasal discharge. Neck: Neck is supple. No masses or thyromegaly. Resp: Respirations are non-labored. No wheezing. Skin: Warm. No rashes or ulcers.  Neuro Mental Status AAOx4; speech fluent; language and comprehension intact; recalls 3/3 words after 5 minutes; concentration and attention intact  Cranial Nerves II: Nor abnormalities in visual fields. III, IV, VI: PERRLA, EOMI, no nystagmus V: Normal  sensation in V1, V2, and V3 segments bilaterally  VII: Facial symmetry at rest and during various facial expressions VIII: Normal hearing to speech  IX, X: Normal palatal elevation, no uvular deviation  XI: Trapezius 5/5, SCM 5/5 XII: Symmetric tongue movement, tongue is midline without atrophy or fasciculations.   Motor Normal bulk and tone Strength R UE: 5/5 L UE: 5/5 BLE: 2/5, limited by pain   Sensory  Intact sensation of UE and LE bilaterally.   Cerebellar: FNF:WNL Ulnar Drift: negative  ASSESSMENT/PLAN Ms. Jade Ellison is a 76 y.o. female with PMHx of non-insulin-dependent diabetes mellitus type 2, hypertension, CKD stage IIIa, obstructive sleep apnea (CPAP titration study pending), hyperlipidemia, gastroesophageal reflux disease, recurrent urinary tract infections (on supressive therapy with Keflex), diastolic congestive heart failure (Echo 09/2011 EF 55-60% with G1DD) presenting to [facility] emergency department with sudden changes in speech. MRI shows acute lacunar infarct of corpus callosum likely 2/2 to cardioembolic source. On Eliquis for new onset a-fib, cardiology following.   Stroke Acute lacunar infarct involving corpus callosum likely small vessel disease Etiology: EKG shows new A-fib hence could be cardioembolic  CT: No acute findings. Cerebral atrophy and chronic small vessel disease. CTA head & neck: negative for large vessel occlusions. Mild-to-moderate atheromatous change about the carotid bifurcations and carotid siphons without hemodynamically significant or correctable stenosis. MRI: Acute  subacute punctate nonhemorrhagic infarct in the splenium of the corpus callosum. Moderate generalized atrophy and diffuse white matter disease likely reflects the sequela of chronic microvascular ischemia. MRA: no acute findings.  2D Echo: LVEF 55-60%, G1DD. Normal left atrial size.  LDL 32 HgbA1c 7.2 VTE prophylaxis - Lovenox aspirin 81 mg daily prior to  admission, now on aspirin 81 mg daily for now, new onset a-flutter pending cardiology recommendations for anticoagulation.  Therapy recommendations:  AIR  Disposition:  pending  Hypertension Home meds:  losartan-hctz (Hyzaar) 100-12.5 mg Stable Permissive hypertension (OK if < 220/120) but gradually normalize in 5-7 days Long-term BP goal normotensive  Hyperlipidemia Home meds:  Lipitor 20, resumed in hospital LDL 32, goal < 70 Continue statin at discharge  Diabetes type II Uncontrolled Home meds:  Metformin 1000 mg, Glipizide HgbA1c 7.2, goal < 7.0 CBGs Recent Labs    10/07/21 2116 10/08/21 0626 10/08/21 1142  GLUCAP 209* 160* 230*    SSI   New Onset Atrial Flutter vs Atrial Fibrillation Eliquis 5 mg BID Home meds: Metropolol 50 mg BID, continued in-house  Other Stroke Risk Factors Advanced Age >/= 69  Obesity,  BMI >/= 30 associated with increased stroke risk, recommend weight loss, diet and exercise as appropriate  Coronary artery disease Obstructive sleep apnea, Hx of non-adherence.  Congestive heart failure  Hospital day # 1  Christene Slates, MD PGY-1  ASSESSMENT:I have personally obtained history,examined this patient, reviewed notes, independently viewed imaging studies, participated in medical decision making and plan of care.ROS completed by me personally and pertinent positives fully documented  I have made any additions or clarifications directly to the above note. Agree with note above.    Antony Contras, MD Medical Director [place] Pager: [phone] 10/08/2021 5:47 PM  To contact Stroke Continuity provider, please refer to [email or web address] After hours, contact General Neurology

## 2021-10-08 NOTE — Telephone Encounter (Signed)
Pharmacy Patient Advocate Encounter  Insurance verification completed.    The patient is insured through [place] patient is currently admitted and ran test claims for the following: Eliquis.  Copays and coinsurance results were relayed to Inpatient clinical team.

## 2021-10-08 NOTE — Discharge Instructions (Signed)

## 2021-10-08 NOTE — TOC Benefit Eligibility Note (Signed)
Patient Teacher, English as a foreign language completed.    The current 30 day co-pay is, $45.00    The patient is insured through [place], [place] Patient Advocate Specialist [place] Patient Advocate Team Direct Number: [phone]  Fax: [phone]

## 2021-10-08 NOTE — Discharge Summary (Signed)
Physician Discharge Summary  Jade Ellison WNU:[id number] DOB: [birth year] DOA: 10/06/2021  PCP: Jade Limbo, MD  Admit date: 10/06/2021 Discharge date: 10/08/2021 30 Day Unplanned Readmission Risk Score    Flowsheet Row ED to Hosp-Admission (Current) from 10/06/2021 in [facility]  30 Day Unplanned Readmission Risk Score (%) 12.54 Filed at 10/08/2021 0801       This score is the patient's risk of an unplanned readmission within 30 days of being discharged (0 -100%). The score is based on dignosis, age, lab data, medications, orders, and past utilization.   Low:  0-14.9   Medium: 15-21.9   High: 22-29.9   Extreme: 30 and above          Admitted From: Home Disposition: Home  Recommendations for Outpatient Follow-up:  Follow up with PCP in 1-2 weeks Please obtain BMP/CBC in one week Follow up with neurology in 4 weeks Please follow up with your PCP on the following pending results: Unresulted Labs (From admission, onward)    None         Home Health: Yes Equipment/Devices: None  Discharge Condition: Stable CODE STATUS: Full code Diet recommendation: Cardiac  Subjective: Patient seen and examined.  She has no complaints.  Daughter and son at the bedside.  They do not want her to go to SNF.  Daughter is requesting discharge home today.  She is requesting home health.  Brief/Interim Summary: 76 year old female with past medical history of non-insulin-dependent diabetes mellitus type 2, hypertension, obstructive sleep apnea (CPAP titration study pending), hyperlipidemia, gastroesophageal reflux disease, recurrent urinary tract infections (on supressive therapy with Keflex), diastolic congestive heart failure (Echo 09/2011 EF 55-60% with G1DD) presented to [facility] emergency department with sudden changes in speech.  Patient has been experiencing gradually increasing weakness and unsteady gait since approximately mid August.  Patient and family  explained that in sudden stepwise fashion beginning with sudden right lower extremity weakness in mid August prompting her to begin to intermittently use a wheelchair to get around her home.  In the days and weeks that followed patient would have stepwise worsening in her symptoms including a markedly unsteady gait making it almost impossible for her to ever ambulate.  The symptoms continue to persist and this afternoon the daughter reports that the patient suddenly was unable to speak.  After short while patient was finally able to speak but the words that she was using were nonsensical.  Patient and family deny any associated facial droop, change in vision, headache.     Due to these ongoing symptoms EMS was contacted and patient was promptly evaluated and brought into [facility] emergency room for evaluation.  Initial noncontrast CT pending of the brain was unremarkable however follow-up MRI of the brain revealed multiple acute to subacute areas of infarction involving the right frontal operculum as well as the splenium of the corpus callosum with concerns for embolic etiology.  EDP discussed these findings with Dr. Cheral Marker with neurology who recommended hospitalization for stroke work-up with Dr. Rory Percy to evaluate in consultation later in the evening.  The hospitalist group was then called to assess the patient for admission to the hospital.  Details below.   Acute ischemic stroke: Multiple areas of acute/subacute infarction on MRI brain are suggestive of a cardioembolic etiology especially in the setting of new diagnosis of atrial fibrillation on admission EKG. seen by neurology.  Started on aspirin 81 mg initially, cardiology was consulted, they recommended anticoagulation.  Neurology cleared her  and she was started on Eliquis on the evening of 10/07/2021.  She was seen by PT OT who recommended SNF but family is declining that and they want her to go home. MR angiogram unremarkable.  CTA head and  neck shows mild-to-moderate atheromatous change about the carotid bifurcations and carotid siphons without hemodynamically significant or correctable stenosis. Diffuse tortuosity of the major arterial vasculature of the head and neck, suggesting chronic underlying hypertensionPatient currently does not have any focal deficit.  Echo shows normal ejection fraction and normal diastolic dysfunction, no PFO.  PT OT evaluation completed they recommend CIR.  LDL 32.  Hemoglobin A1c 7.2.  Patient on atorvastatin.   Newly discovered atrial fibrillation: Rates controlled.  Cardiology recommended resuming metoprolol and started her on Eliquis.   CKD stage IIIa: At baseline.   Acute cystitis/UTI: He was started on Rocephin and she will receive her third dose today before discharge.   Type 2 diabetes mellitus: Hemoglobin A1c 7.2.  Takes metformin and glipizide at home which she will continue.   Essential hypertension: Controlled.  Resume home medications.   OSA: Patient noncompliant with CPAP.   GERD: Continue PPI.  Discharge plan was discussed with patient and/or family member and they verbalized understanding and agreed with it.  Discharge Diagnoses:  Principal Problem:   Acute ischemic stroke [facility]) Active Problems:   Paroxysmal atrial fibrillation (HCC)   Acute cystitis without hematuria   Chronic kidney disease, stage 3a (HCC)   Type 2 diabetes mellitus with stage 3a chronic kidney disease ([place])   Essential hypertension   Mixed diabetic hyperlipidemia associated with type 2 diabetes mellitus (HCC)   OSA (obstructive sleep apnea)   GERD (gastroesophageal reflux disease)    Discharge Instructions   Allergies as of 10/08/2021       Reactions   Neurontin [gabapentin] Other (See Comments)   Dizziness   Codeine Rash   Fosamax [alendronate Sodium] Other (See Comments)   Upset stomach    Nsaids Other (See Comments)   Abdominal Pain        Medication List     STOP taking these  medications    aspirin EC 81 MG tablet       TAKE these medications    acetaminophen 325 MG tablet Commonly known as: TYLENOL Take 650 mg by mouth every 6 (six) hours as needed for moderate pain or headache.   ALPRAZolam 0.25 MG tablet Commonly known as: XANAX Take 0.25 mg by mouth 2 (two) times daily as needed for anxiety.   apixaban 5 MG Tabs tablet Commonly known as: ELIQUIS Take 1 tablet (5 mg total) by mouth 2 (two) times daily.   atorvastatin 20 MG tablet Commonly known as: LIPITOR Take 20 mg by mouth daily.   B-12 PO Take 1 capsule by mouth daily.   BIOTENE/CALCIUM MT Take 1 Dose by mouth daily as needed (dry mouth).   desonide 0.05 % cream Commonly known as: DESOWEN Apply 1 application topically 2 (two) times daily as needed (dry skin/irritation).   diclofenac sodium 1 % Gel Commonly known as: VOLTAREN Apply 2 g topically 4 (four) times daily. What changed:  when to take this reasons to take this   dicyclomine 20 MG tablet Commonly known as: BENTYL TAKE 1 TABLET(20 MG) BY MOUTH THREE TIMES DAILY AFTER MEALS What changed: See the new instructions.   diphenhydrAMINE 25 MG tablet Commonly known as: BENADRYL Take 25 mg by mouth daily as needed for itching.   DULoxetine 60 MG capsule Commonly  known as: CYMBALTA Take 60 mg by mouth daily.   ferrous sulfate 325 (65 FE) MG tablet Take 325 mg by mouth daily.   glipiZIDE 10 MG 24 hr tablet Commonly known as: GLUCOTROL XL Take 10 mg by mouth daily.   latanoprost 0.005 % ophthalmic solution Commonly known as: XALATAN Place 1 drop into both eyes at bedtime.   losartan-hydrochlorothiazide 100-12.5 MG tablet Commonly known as: HYZAAR Take 1 tablet by mouth daily.   metFORMIN 1000 MG tablet Commonly known as: GLUCOPHAGE Take 1,000 mg by mouth 2 (two) times daily with a meal.   metoprolol tartrate 50 MG tablet Commonly known as: LOPRESSOR Take 50 mg by mouth 2 (two) times daily.   multivitamin with  minerals Tabs tablet Take 1 tablet by mouth daily.   omeprazole 40 MG capsule Commonly known as: PRILOSEC TAKE 1 CAPSULE(40 MG) BY MOUTH DAILY What changed: See the new instructions.   pregabalin 50 MG capsule Commonly known as: LYRICA Take 50 mg by mouth 2 (two) times daily.   psyllium 28 % packet Commonly known as: METAMUCIL SMOOTH TEXTURE Take 1 packet by mouth daily as needed (constipation).   saccharomyces boulardii 250 MG capsule Commonly known as: Florastor Take 1 capsule (250 mg total) by mouth daily.   Vitamin D (Ergocalciferol) 1.25 MG (50000 UNIT) Caps capsule Commonly known as: DRISDOL Take 50,000 Units by mouth every Wednesday.        Follow-up Information     Jade Limbo, MD Follow up in 1 week(s).   Specialty: Family Medicine Contact information: [address] [place] [address] [phone]         [facility] Follow up in 4 week(s).   Contact information: [address]     [address] [place] [address] [phone]               Allergies  Allergen Reactions   Neurontin [Gabapentin] Other (See Comments)    Dizziness   Codeine Rash   Fosamax [Alendronate Sodium] Other (See Comments)    Upset stomach    Nsaids Other (See Comments)    Abdominal Pain    Consultations: Neurology and cardiology   Procedures/Studies: ECHOCARDIOGRAM COMPLETE BUBBLE STUDY  Result Date: 10/07/2021    ECHOCARDIOGRAM REPORT   Patient Name:   QUINNLYN HEARNS Date of Exam: 10/07/2021 Medical Rec #:  [id number]        Height:       63.0 in Accession #:    [id number]       Weight:       283.0 lb Date of Birth:  05/17/1945        BSA:          2.241 m Patient Age:    43 years         BP:           152/94 mmHg Patient Gender: F                HR:           71 bpm. Exam Location:  Inpatient Procedure: 2D Echo, Cardiac Doppler, Color Doppler and Saline Contrast Bubble            Study Indications:     Stroke  History:        Patient has prior history of Echocardiogram examinations, most                 recent 09/14/2011. CHF; Risk  Factors:Hypertension, Diabetes, Sleep                 Apnea and Dyslipidemia.  Sonographer:    Eartha Inch Referring Phys: [id number] Vernelle Emerald  Sonographer Comments: Technically difficult study due to poor echo windows. Negative bubble study IMPRESSIONS  1. Left ventricular ejection fraction, by estimation, is 55 to 60%. The left ventricle has normal function. The left ventricle has no regional wall motion abnormalities. Left ventricular diastolic function could not be evaluated.  2. Right ventricular systolic function is mildly reduced. The right ventricular size is normal.  3. The mitral valve is abnormal. Trivial mitral valve regurgitation.  4. The aortic valve was not well visualized. Aortic valve regurgitation is not visualized.  5. The inferior vena cava is normal in size with greater than 50% respiratory variability, suggesting right atrial pressure of 3 mmHg.  6. Agitated saline contrast bubble study was negative, with no evidence of any interatrial shunt. Comparison(s): Changes from prior study are noted. 09/14/2011: LVEF 55-60%, negative bubble study. FINDINGS  Left Ventricle: Left ventricular ejection fraction, by estimation, is 55 to 60%. The left ventricle has normal function. The left ventricle has no regional wall motion abnormalities. The left ventricular internal cavity size was normal in size. There is  no left ventricular hypertrophy. Left ventricular diastolic function could not be evaluated due to atrial fibrillation. Left ventricular diastolic function could not be evaluated. Right Ventricle: The right ventricular size is normal. No increase in right ventricular wall thickness. Right ventricular systolic function is mildly reduced. Left Atrium: Left atrial size was normal in size. Right Atrium: Right atrial size was normal in size. Pericardium: There is no  evidence of pericardial effusion. Mitral Valve: The mitral valve is abnormal. Mild to moderate mitral annular calcification. Trivial mitral valve regurgitation. MV peak gradient, 6.6 mmHg. The mean mitral valve gradient is 3.0 mmHg. Tricuspid Valve: The tricuspid valve is not well visualized. Tricuspid valve regurgitation is not demonstrated. Aortic Valve: The aortic valve was not well visualized. Aortic valve regurgitation is not visualized. Pulmonic Valve: The pulmonic valve was not well visualized. Pulmonic valve regurgitation is not visualized. Aorta: The aortic root and ascending aorta are structurally normal, with no evidence of dilitation. Venous: The inferior vena cava is normal in size with greater than 50% respiratory variability, suggesting right atrial pressure of 3 mmHg. IAS/Shunts: No atrial level shunt detected by color flow Doppler. Agitated saline contrast was given intravenously to evaluate for intracardiac shunting. Agitated saline contrast bubble study was negative, with no evidence of any interatrial shunt.  LEFT VENTRICLE PLAX 2D LVIDd:         4.80 cm   Diastology LVIDs:         3.70 cm   LV e' medial:    5.40 cm/s LV PW:         1.00 cm   LV E/e' medial:  23.1 LV IVS:        0.80 cm   LV e' lateral:   6.30 cm/s LVOT diam:     1.70 cm   LV E/e' lateral: 19.8 LVOT Area:     2.27 cm  RIGHT VENTRICLE            IVC RV S prime:     7.80 cm/s  IVC diam: 1.80 cm LEFT ATRIUM             Index        RIGHT ATRIUM  Index LA diam:        3.80 cm 1.70 cm/m   RA Area:     15.10 cm LA Vol (A2C):   46.3 ml 20.66 ml/m  RA Volume:   31.90 ml  14.23 ml/m LA Vol (A4C):   61.9 ml 27.62 ml/m LA Biplane Vol: 56.8 ml 25.35 ml/m   AORTA Ao Root diam: 2.40 cm MITRAL VALVE MV Area (PHT): 3.48 cm     SHUNTS MV Peak grad:  6.6 mmHg     Systemic Diam: 1.70 cm MV Mean grad:  3.0 mmHg MV Vmax:       1.28 m/s MV Vmean:      79.7 cm/s MV Decel Time: 218 msec MV E velocity: 125.00 cm/s Lyman Bishop MD  Electronically signed by Lyman Bishop MD Signature Date/Time: 10/07/2021/11:47:07 AM    Final    CT ANGIO HEAD NECK W WO CM  Result Date: 10/07/2021 CLINICAL DATA:  Follow-up examination for stroke. EXAM: CT ANGIOGRAPHY HEAD AND NECK TECHNIQUE: Multidetector CT imaging of the head and neck was performed using the standard protocol during bolus administration of intravenous contrast. Multiplanar CT image reconstructions and MIPs were obtained to evaluate the vascular anatomy. Carotid stenosis measurements (when applicable) are obtained utilizing NASCET criteria, using the distal internal carotid diameter as the denominator. RADIATION DOSE REDUCTION: This exam was performed according to the departmental dose-optimization program which includes automated exposure control, adjustment of the mA and/or kV according to patient size and/or use of iterative reconstruction technique. CONTRAST:  87m OMNIPAQUE IOHEXOL 350 MG/ML SOLN COMPARISON:  CT and MRI from earlier the same day. FINDINGS: CTA NECK FINDINGS Aortic arch: Visualized aortic arch normal in caliber with standard 3 vessel morphology. Mild atheromatous change about the arch itself. No stenosis about the origin the great vessels. Right carotid system: Right common and internal carotid arteries are tortuous. Moderate calcified plaque about the right carotid bulb without hemodynamically significant stenosis. No dissection or occlusion. Left carotid system: Left common and internal carotid arteries are tortuous. Mild atheromatous change about the left carotid bulb without hemodynamically significant stenosis. No dissection or occlusion. Vertebral arteries: Both vertebral arteries arise from subclavian arteries. No proximal subclavian artery stenosis. Right vertebral artery dominant. Vertebral arteries patent without stenosis or dissection. Skeleton: No discrete or worrisome osseous lesions. Moderate degenerative spurring noted throughout the visualized  cervicothoracic spine. Moderate to advanced osteoarthritic changes present about the C1-2 articulation. Other neck: No other acute soft tissue abnormality within the neck. Few scattered thyroid nodules noted, largest of which measures up to 2.3 cm on the left and is partially calcified. Upper chest: Layering left pleural effusion, partially visualized. No other acute abnormality within the visualized upper chest. Review of the MIP images confirms the above findings CTA HEAD FINDINGS Anterior circulation: Petrous segments patent bilaterally. Atheromatous plaque within the carotid siphons without hemodynamically significant stenosis. A1 segments patent bilaterally. Normal anterior communicating artery complex. Anterior cerebral arteries widely patent without stenosis. No M1 stenosis or occlusion. No proximal MCA branch occlusion or stenosis. Distal MCA branches perfused and symmetric. Posterior circulation: Atheromatous plaque within the dominant proximal right V4 segment with associated mild stenosis (series 7, image 142). Hypoplastic left V4 segment widely patent. Left PICA patent. Right PICA not well seen. Basilar patent to its distal aspect without stenosis. Superior cerebral arteries patent bilaterally. Left PCA supplied via a hypoplastic left P1 segment and robust left posterior communicating artery. Fetal type origin right PCA. Both PCAs patent to their distal aspects without  stenosis. Venous sinuses: Patent allowing for timing the contrast bolus. Anatomic variants: As above.  No aneurysm. Review of the MIP images confirms the above findings IMPRESSION: 1. Negative CTA for large vessel occlusion or other emergent finding. 2. Mild-to-moderate atheromatous change about the carotid bifurcations and carotid siphons without hemodynamically significant or correctable stenosis. 3. Diffuse tortuosity of the major arterial vasculature of the head and neck, suggesting chronic underlying hypertension. 4. Layering left  pleural effusion, partially visualized. 5. 2.3 cm left thyroid nodule, indeterminate. Further evaluation with dedicated thyroid ultrasound recommended. This could be performed on a nonemergent outpatient basis (Ref: J Am Coll Radiol. 2015 Feb;12(2): 143-50). Electronically Signed   By: Jeannine Boga M.D.   On: 10/07/2021 00:24   MR ANGIO HEAD WO CONTRAST  Result Date: 10/06/2021 CLINICAL DATA:  Stroke/TIA. Abnormal MR head with cortical infarcts of the right frontal operculum and focal infarct involving the splenium of the corpus callosum. EXAM: MRA HEAD WITHOUT CONTRAST TECHNIQUE: Angiographic images of the Circle of Willis were acquired using MRA technique without intravenous contrast. COMPARISON:  MR head without contrast 10/06/2021 FINDINGS: Anterior circulation: Internal carotid arteries are within normal limits the high cervical segments through the ICA termini bilaterally. The A1 and M1 segments are normal. No definite anterior communicating artery is present. MCA bifurcations are within normal limits. The ACA and MCA branch vessels are normal. Posterior circulation: Right vertebral artery is dominant vessel. Vertebrobasilar junction basilar artery are. Fetal type posterior cerebral arteries are present bilaterally. Small P1 segments are noted. PCA branch vessels are within normal limits bilaterally. Anatomic variants: Bilateral fetal type posterior cerebral arteries. Other: None. IMPRESSION: 1. Normal MRA circle-of-Willis. No significant proximal stenosis, aneurysm, or branch vessel occlusion. The up Electronically Signed   By: San Morelle M.D.   On: 10/06/2021 18:54   MR BRAIN WO CONTRAST  Result Date: 10/06/2021 CLINICAL DATA:  TIA. Sudden onset of confusion at 1:30 this afternoon. EXAM: MRI HEAD WITHOUT CONTRAST TECHNIQUE: Multiplanar, multiecho pulse sequences of the brain and surrounding structures were obtained without intravenous contrast. COMPARISON:  CT head without contrast  10/06/2021. FINDINGS: Brain: Diffusion-weighted images demonstrate focal area of restricted cortical diffusion in the right frontal operculum punctate focus of restricted diffusion is also present within the splenium of the corpus callosum. Subtle FLAIR signal is present both locations. Moderate generalized atrophy and diffuse white matter changes are present otherwise. White matter changes extend into the brainstem. Cerebellum is unremarkable. Vascular: Flow is present in the major intracranial arteries. The craniocervical junction is normal. Upper cervical spine is within normal limits. Marrow signal is unremarkable. Skull and upper cervical spine: Degenerative changes are present in the upper cervical spine. Prominent soft tissue is present posterior to the dens. Craniocervical junction is otherwise within normal limits. Sinuses/Orbits: Bilateral mastoid effusions are present. No obstructing nasopharyngeal lesion is present. Paranasal sinuses otherwise clear. Bilateral lens replacements are noted. Globes and orbits are otherwise unremarkable. IMPRESSION: 1. Acute/subacute cortical nonhemorrhagic infarct involving the right frontal operculum. 2. Acute subacute punctate nonhemorrhagic infarct in the splenium of the corpus callosum. 3. Question central embolic etiology. 4. Moderate generalized atrophy and diffuse white matter disease likely reflects the sequela of chronic microvascular ischemia. 5. Bilateral mastoid effusions. No obstructing nasopharyngeal lesion is present. Electronically Signed   By: San Morelle M.D.   On: 10/06/2021 18:48   CT Head Wo Contrast  Result Date: 10/06/2021 CLINICAL DATA:  Altered mental status. EXAM: CT HEAD WITHOUT CONTRAST TECHNIQUE: Contiguous axial images were obtained from  the base of the skull through the vertex without intravenous contrast. RADIATION DOSE REDUCTION: This exam was performed according to the departmental dose-optimization program which includes  automated exposure control, adjustment of the mA and/or kV according to patient size and/or use of iterative reconstruction technique. COMPARISON:  02/08/2012 FINDINGS: Brain: No evidence of intracranial hemorrhage, acute infarction, hydrocephalus, extra-axial collection, or mass lesion/mass effect. Mild cerebral atrophy and moderate chronic small vessel disease shows progression since prior exam. Vascular:  No hyperdense vessel or other acute findings. Skull: No evidence of fracture or other significant bone abnormality. Sinuses/Orbits:  No acute findings. Other: None. IMPRESSION: No acute intracranial abnormality. Cerebral atrophy and chronic small vessel disease. Electronically Signed   By: Marlaine Hind M.D.   On: 10/06/2021 17:24     Discharge Exam: Vitals:   10/08/21 0411 10/08/21 0849  BP: 127/70 (!) 150/93  Pulse: 66 69  Resp: 12 19  Temp: (!) 97.5 F (36.4 C) 97.8 F (36.6 C)  SpO2: 99% 98%   Vitals:   10/07/21 2255 10/07/21 2350 10/08/21 0411 10/08/21 0849  BP: 133/82  127/70 (!) 150/93  Pulse: 71 78 66 69  Resp: '16 16 12 19  '$ Temp: 97.9 F (36.6 C)  (!) 97.5 F (36.4 C) 97.8 F (36.6 C)  TempSrc:   Oral Oral  SpO2: 98% 98% 99% 98%    General: Pt is alert, awake, not in acute distress Cardiovascular: RRR, S1/S2 +, no rubs, no gallops Respiratory: CTA bilaterally, no wheezing, no rhonchi Abdominal: Soft, NT, ND, bowel sounds + Extremities: no edema, no cyanosis    The results of significant diagnostics from this hospitalization (including imaging, microbiology, ancillary and laboratory) are listed below for reference.     Microbiology: No results found for this or any previous visit (from the past 240 hour(s)).   Labs: BNP (last 3 results) Recent Labs    10/06/21 1541  BNP 875.6*   Basic Metabolic Panel: Recent Labs  Lab 10/06/21 1551 10/07/21 0448  NA 136 139  K 3.7 3.5  CL 99 100  CO2 26 28  GLUCOSE 124* 128*  BUN 14 13  CREATININE 1.02* 0.99   CALCIUM 8.9 9.2  MG  --  1.1*   Liver Function Tests: Recent Labs  Lab 10/06/21 1551 10/07/21 0448  AST 21 18  ALT 11 12  ALKPHOS 63 60  BILITOT 0.9 0.8  PROT 6.1* 6.2*  ALBUMIN 3.2* 3.1*   No results for input(s): "LIPASE", "AMYLASE" in the last 168 hours. No results for input(s): "AMMONIA" in the last 168 hours. CBC: Recent Labs  Lab 10/06/21 1551 10/07/21 0448  WBC 6.1 5.2  NEUTROABS 4.4 3.2  HGB 11.4* 11.2*  HCT 36.7 36.5  MCV 84.6 84.9  PLT 181 182   Cardiac Enzymes: No results for input(s): "CKTOTAL", "CKMB", "CKMBINDEX", "TROPONINI" in the last 168 hours. BNP: Invalid input(s): "POCBNP" CBG: Recent Labs  Lab 10/07/21 0819 10/07/21 1203 10/07/21 1531 10/07/21 2116 10/08/21 0626  GLUCAP 128* 237* 188* 209* 160*   D-Dimer No results for input(s): "DDIMER" in the last 72 hours. Hgb A1c Recent Labs    10/07/21 0448  HGBA1C 7.2*   Lipid Profile Recent Labs    10/07/21 0448  CHOL 91  HDL 44  LDLCALC 32  TRIG 77  CHOLHDL 2.1   Thyroid function studies Recent Labs    10/08/21 0403  TSH 1.320   Anemia work up No results for input(s): "VITAMINB12", "FOLATE", "FERRITIN", "TIBC", "IRON", "RETICCTPCT" in the  last 72 hours. Urinalysis    Component Value Date/Time   COLORURINE YELLOW 10/06/2021 2030   APPEARANCEUR CLOUDY (A) 10/06/2021 2030   LABSPEC 1.015 10/06/2021 2030   PHURINE 6.5 10/06/2021 2030   GLUCOSEU NEGATIVE 10/06/2021 2030   HGBUR TRACE (A) 10/06/2021 2030   BILIRUBINUR NEGATIVE 10/06/2021 2030   [place] 10/06/2021 2030   PROTEINUR 100 (A) 10/06/2021 2030   UROBILINOGEN 0.2 02/08/2012 0025   NITRITE POSITIVE (A) 10/06/2021 2030   LEUKOCYTESUR LARGE (A) 10/06/2021 2030   Sepsis Labs Recent Labs  Lab 10/06/21 1551 10/07/21 0448  WBC 6.1 5.2   Microbiology No results found for this or any previous visit (from the past 240 hour(s)).   Time coordinating discharge: Over 30 minutes  SIGNED:   Darliss Cheney,  MD  [facility] 10/08/2021, 11:18 AM *Please note that this is a verbal dictation therefore any spelling or grammatical errors are due to the "[place] [employer]" system interpretation. If 7PM-7AM, please contact night-coverage [email or web address]

## 2021-10-09 NOTE — Telephone Encounter (Signed)
I attempted to contact patient to get scheduled.  She did not answer- unable to leave a message.   Denny Peon- will you try and call again offer November 17th at 8:00 AM or 9:00 AM double book okay.   Thanks!

## 2021-10-11 ENCOUNTER — Ambulatory Visit (INDEPENDENT_AMBULATORY_CARE_PROVIDER_SITE_OTHER): Payer: Medicare HMO | Admitting: Adult Health

## 2021-10-11 ENCOUNTER — Encounter: Payer: Self-pay | Admitting: Adult Health

## 2021-10-11 VITALS — BP 140/90 | HR 116 | Ht 63.0 in | Wt 260.0 lb

## 2021-10-11 DIAGNOSIS — G4733 Obstructive sleep apnea (adult) (pediatric): Secondary | ICD-10-CM | POA: Diagnosis not present

## 2021-10-11 DIAGNOSIS — I480 Paroxysmal atrial fibrillation: Secondary | ICD-10-CM

## 2021-10-11 DIAGNOSIS — I639 Cerebral infarction, unspecified: Secondary | ICD-10-CM | POA: Diagnosis not present

## 2021-10-11 NOTE — Assessment & Plan Note (Addendum)
Recent hospitalization with acute ischemic stroke.  She has increased generalized weakness and some mild speech impairment.  To begin home PT as planned. Follow up with Neurology as planned   Plan  Patient Instructions  Set up for [facility] night sleep study -urgent referral  Healthy sleep regimen  Do not drive if sleepy  Work on healthy weight loss  Follow up in 4-6 weeks to discuss results and treatment plan

## 2021-10-11 NOTE — Patient Instructions (Signed)
Set up for [facility] night sleep study -urgent referral  Healthy sleep regimen  Do not drive if sleepy  Work on healthy weight loss  Follow up in 4-6 weeks to discuss results and treatment plan

## 2021-10-11 NOTE — Assessment & Plan Note (Signed)
New onset A-fib.  Continue on metoprolol and Eliquis.  Follow-up with cardiology as planned

## 2021-10-11 NOTE — Progress Notes (Addendum)
$'@Patient'C$  ID: Jade Ellison, female    DOB: [birth year], 77 y.o.   MRN: [id number]  Chief Complaint  Patient presents with   Consult    Referring provider: Berna Ellison  HPI: 76 year old female seen for sleep consult October 11, 2021 to establish for sleep apnea Medical history significant for acute stroke and A-fib hospitalization September 2023, history of diabetes, diastolic heart failure  TEST/EVENTS :   10/11/2021 Sleep consult and [facility] follow up  Patient presents for sleep consult today.  Patient is accompanied by her daughter.  Patient was recently hospitalized this past week for acute stroke.  Multiple areas of acute and subacute infarction on MRI suggestive of a cardioembolic etiology in the setting of new diagnosis of A-fib.  Patient was recommended for anticoagulation therapy.  Started on Eliquis.  CTA head and neck showed mild to moderate atheromatous change about the carotid bifurcations without significant stenosis.  2D echo with bubble study showed normal EF, right ventricular systolic function mildly reduced.  RV size normal.  Bubble study was negative for intra-atrial shunt.  She was found to have A-fib.  Seen by cardiology.  Recommend to restart metoprolol and begin Eliquis.  Patient says she was diagnosed with severe sleep apnea greater than 15 years ago.  She did use CPAP briefly but said that she was unable to tolerate.  Has not been on CPAP for greater than 15 years.  Daughter says patient snores very loudly.  Has noted witnessed apneic events.  Patient says her sleep is somewhat restless.  She has difficulty with her knees and hips.  She has been very inactive for a long time and is mainly in the wheelchair.  She can stand transition and pivot.  Since her stroke she is having more generalized weakness and some mild intermittent speech changes. Tip goes to bed about 8 PM gets up about 8 AM.  Has 1 cup of coffee daily.  No symptoms concerning for sleep  paralysis or cataplexy.      10/11/2021   11:00 AM  Results of the Epworth flowsheet  Sitting and reading 0  Watching TV 2  Sitting, inactive in a public Ellison (e.g. a theatre or a meeting) 0  As a passenger in a car for an hour without a break 0  Lying down to rest in the afternoon when circumstances permit 1  Sitting and talking to someone 0  Sitting quietly after a lunch without alcohol 1  In a car, while stopped for a few minutes in traffic 0  Total score 4   SH:  Patient is widowed.  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  She lives with her daughter Jade Ellison.  She has 2 adult children.  She is a never smoker.  Rarely drinks alcohol.  No drug use.  Family history positive for heart disease and diabetes.  Surgical history -cholecystectomy in 1986 Past Surgical History:  Procedure Laterality Date   BIOPSY  11/07/2019   Procedure: BIOPSY;  Surgeon: Jade Pole, MD;  Location: [facility];  Service: Endoscopy;;   CARDIAC CATHETERIZATION  2003    normal coronary arteries and nomal LV function by cath performed by Dr.Peter [place] Ellison     COLONOSCOPY WITH PROPOFOL N/A 11/07/2019   Procedure: COLONOSCOPY WITH PROPOFOL;  Surgeon: Jade Pole, MD;  Location: [facility];  Service: Endoscopy;  Laterality: N/A;   ENTEROSCOPY  02/05/2012   Procedure:  ENTEROSCOPY;  Surgeon: Jade Dragon, MD;  Location: [facility];  Service: Endoscopy;  Laterality: N/A;   ESOPHAGOGASTRODUODENOSCOPY N/A 04/19/2014   Procedure: ESOPHAGOGASTRODUODENOSCOPY (EGD);  Surgeon: Jade Dragon, MD;  Location: [facility];  Service: Endoscopy;  Laterality: N/A;   ESOPHAGOGASTRODUODENOSCOPY (EGD) WITH PROPOFOL N/A 08/01/2015   Procedure: ESOPHAGOGASTRODUODENOSCOPY (EGD) WITH PROPOFOL;  Surgeon: Jade Pole, MD;  Location: [facility];  Service: Endoscopy;  Laterality: N/A;   ESOPHAGOGASTRODUODENOSCOPY (EGD) WITH PROPOFOL N/A 11/07/2019    Procedure: ESOPHAGOGASTRODUODENOSCOPY (EGD) WITH PROPOFOL;  Surgeon: Jade Pole, MD;  Location: [facility];  Service: Endoscopy;  Laterality: N/A;   HERNIA REPAIR     HOT HEMOSTASIS  02/05/2012   Procedure: HOT HEMOSTASIS (ARGON PLASMA COAGULATION/BICAP);  Surgeon: Jade Dragon, MD;  Location: [facility];  Service: Endoscopy;  Laterality: N/A;   KNEE ARTHROSCOPY Left    left sided lithotripsy     POLYPECTOMY  11/07/2019   Procedure: POLYPECTOMY;  Surgeon: Jade Pole, MD;  Location: [facility];  Service: Endoscopy;;   TUBAL LIGATION        Allergies  Allergen Reactions   Neurontin [Gabapentin] Other (See Comments)    Dizziness   Codeine Rash   Fosamax [Alendronate Sodium] Other (See Comments)    Upset stomach    Nsaids Other (See Comments)    Abdominal Pain    Immunization History  Administered Date(s) Administered   Fluad Quad(high Dose 65+) 11/16/2015, 12/01/2016, 09/18/2017, 11/23/2019, 11/29/2020   Influenza, High Dose Seasonal PF 10/16/2011, 10/26/2014   Influenza-Unspecified 11/16/2015, 12/01/2016, 09/18/2017   Moderna Sars-Covid-2 Vaccination 02/25/2019, 03/25/2019   PFIZER(Purple Top)SARS-COV-2 Vaccination 11/23/2019   Pfizer Covid-19 Vaccine Bivalent Booster 50yr & up 11/29/2020   Pneumococcal Polysaccharide-23 09/13/2009    Past Medical History:  Diagnosis Date   Adult stuttering    Anemia    Anxiety    Panic attack- - 1 year ago (cries - doneest know why and gets nervous   Arthritis    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 pylori)    Hemorrhoids    Hernia of unspecified site of abdominal cavity without mention of  obstruction or gangrene    History of blood transfusion    History of kidney stones    Hypertension 09 07 2013   TRANSTHORACIC ECHO STUDY CONCLUSIONS    Hypertension 09 07 2013   EJECTION FRACTION- 55%-60% .WALL MOTION WAS NORMAL   Impaired mobility    Iron deficiency anemia    Morbid obesity (HCC)    Neuropathy    Obesity    Olecranon bursitis of left elbow    OSA (obstructive sleep apnea)    On cpap   Osteoarthritis    right hip   Osteoporosis    Primary osteoarthritis    Bilaterally (knee)   PSVT (paroxysmal supraventricular tachycardia) (HCC)    PSVT (paroxysmal supraventricular tachycardia) (HCC)    Short-term memory loss    Shortness of breath dyspnea    Sleep apnea    on CPAP- does not use machine   Spinal headache 1986   Type 2 diabetes mellitus (  [place])     Tobacco History: Social History   Tobacco Use  Smoking Status Never  Smokeless Tobacco Never   Counseling given: Not Answered   Outpatient Medications Prior to Visit  Medication Sig Dispense Refill   acetaminophen (TYLENOL) 325 MG tablet Take 650 mg by mouth every 6 (six) hours as needed for moderate pain or headache.     ALPRAZolam (XANAX) 0.25 MG tablet Take 0.25 mg by mouth 2 (two) times daily as needed for anxiety.      apixaban (ELIQUIS) 5 MG TABS tablet Take 1 tablet (5 mg total) by mouth 2 (two) times daily. 60 tablet 0   atorvastatin (LIPITOR) 20 MG tablet Take 20 mg by mouth daily.     Cyanocobalamin (B-12 PO) Take 1 capsule by mouth daily.     desonide (DESOWEN) 0.05 % cream Apply 1 application topically 2 (two) times daily as needed (dry skin/irritation).      diclofenac sodium (VOLTAREN) 1 % GEL Apply 2 g topically 4 (four) times daily. (Patient taking differently: Apply 2 g topically daily as needed (pain).) 2 Tube 2   dicyclomine (BENTYL) 20 MG tablet TAKE 1 TABLET(20 MG) BY MOUTH THREE TIMES DAILY AFTER MEALS (Patient taking differently: Take 20 mg by mouth 2 (two) times daily.) 90 tablet 0    diphenhydrAMINE (BENADRYL) 25 MG tablet Take 25 mg by mouth daily as needed for itching.     DULoxetine (CYMBALTA) 60 MG capsule Take 60 mg by mouth daily.     ferrous sulfate 325 (65 FE) MG tablet Take 325 mg by mouth daily.     glipiZIDE (GLUCOTROL XL) 10 MG 24 hr tablet Take 10 mg by mouth daily.     latanoprost (XALATAN) 0.005 % ophthalmic solution Ellison 1 drop into both eyes at bedtime.      losartan-hydrochlorothiazide (HYZAAR) 100-12.5 MG tablet Take 1 tablet by mouth daily.  11   metFORMIN (GLUCOPHAGE) 1000 MG tablet Take 1,000 mg by mouth 2 (two) times daily with a meal.      metoprolol tartrate (LOPRESSOR) 50 MG tablet Take 50 mg by mouth 2 (two) times daily.     Mouthwashes (BIOTENE/CALCIUM MT) Take 1 Dose by mouth daily as needed (dry mouth).      Multiple Vitamin (MULTIVITAMIN WITH MINERALS) TABS tablet Take 1 tablet by mouth daily.     omeprazole (PRILOSEC) 40 MG capsule TAKE 1 CAPSULE(40 MG) BY MOUTH DAILY (Patient taking differently: Take 40 mg by mouth daily.) 90 capsule 0   pregabalin (LYRICA) 50 MG capsule Take 50 mg by mouth 2 (two) times daily.     psyllium (METAMUCIL SMOOTH TEXTURE) 28 % packet Take 1 packet by mouth daily as needed (constipation).      saccharomyces boulardii (FLORASTOR) 250 MG capsule Take 1 capsule (250 mg total) by mouth daily. 30 capsule 0   Vitamin D, Ergocalciferol, (DRISDOL) 1.25 MG (50000 UNIT) CAPS capsule Take 50,000 Units by mouth every Wednesday.     No facility-administered medications prior to visit.     Review of Systems:   Constitutional:   No  weight loss, night sweats,  Fevers, chills, + fatigue, or  lassitude.  HEENT:   No headaches,  Difficulty swallowing,  Tooth/dental problems, or  Sore throat,                No sneezing, itching, ear ache, nasal congestion, post nasal drip,   CV:  No chest pain,  Orthopnea, PND, , anasarca, dizziness, palpitations, syncope.  GI  No heartburn, indigestion, abdominal pain, nausea, vomiting,  diarrhea, change in bowel habits, loss of appetite, bloody stools.   Resp:   No excess mucus, no productive cough,  No non-productive cough,  No coughing up of blood.  No change in color of mucus.  No wheezing.  No chest wall deformity  Skin: no rash or lesions.  GU: no dysuria, change in color of urine, no urgency or frequency.  No flank pain, no hematuria   MS: Positive knee and back pain.    Physical Exam  BP (!) 140/90 (BP Location: Right Arm)   Pulse (!) 116   Ht '5\' 3"'$  (1.6 m)   Wt 260 lb (117.9 kg)   SpO2 95%   BMI 46.06 kg/m   GEN: A/Ox3; pleasant , NAD, well nourished , elderly chronically ill-appearing in wheelchair, generalized weakness   HEENT:  [place]/AT,  NOSE-clear, THROAT-clear, no lesions, no postnasal drip or exudate noted.  Class III MP airway  NECK:  Supple w/ fair ROM; no JVD; normal carotid impulses w/o bruits; no thyromegaly or nodules palpated; no lymphadenopathy.    RESP  Clear  P & A; w/o, wheezes/ rales/ or rhonchi. no accessory muscle use, no dullness to percussion  CARD: irreg /irreg , no m/r/g, tr  peripheral edema, pulses intact, no cyanosis or clubbing.  GI:   Soft & nt; nml bowel sounds; no organomegaly or masses detected.   Musco: Warm bil, no deformities or joint swelling noted.   Neuro: alert, no focal deficits noted.  Generalized weakness  Skin: Warm, no lesions or rashes    Lab Results:  CBC    Component Value Date/Time   WBC 5.2 10/07/2021 0448   RBC 4.30 10/07/2021 0448   HGB 11.2 (L) 10/07/2021 0448   HGB 13.7 12/21/2012 1536   HCT 36.5 10/07/2021 0448   HCT 42.6 12/21/2012 1536   PLT 182 10/07/2021 0448   PLT 164 12/21/2012 1536   MCV 84.9 10/07/2021 0448   MCV 84.7 12/21/2012 1536   MCH 26.0 10/07/2021 0448   MCHC 30.7 10/07/2021 0448   RDW 15.0 10/07/2021 0448   RDW 14.1 12/21/2012 1536   LYMPHSABS 1.2 10/07/2021 0448   LYMPHSABS 1.2 12/21/2012 1536   MONOABS 0.6 10/07/2021 0448   MONOABS 0.6 12/21/2012 1536    EOSABS 0.2 10/07/2021 0448   EOSABS 0.1 12/21/2012 1536   BASOSABS 0.0 10/07/2021 0448   BASOSABS 0.0 12/21/2012 1536    BMET    Component Value Date/Time   NA 139 10/07/2021 0448   NA 139 10/13/2016 1405   NA 139 12/21/2012 1536   K 3.5 10/07/2021 0448   K 4.6 12/21/2012 1536   CL 100 10/07/2021 0448   CO2 28 10/07/2021 0448   CO2 18 (L) 12/21/2012 1536   GLUCOSE 128 (H) 10/07/2021 0448   GLUCOSE 105 12/21/2012 1536   BUN 13 10/07/2021 0448   BUN 25 10/13/2016 1405   BUN 25.9 12/21/2012 1536   CREATININE 0.99 10/07/2021 0448   CREATININE 1.2 (H) 12/21/2012 1536   CALCIUM 9.2 10/07/2021 0448   CALCIUM 9.8 12/21/2012 1536   GFRNONAA 59 (L) 10/07/2021 0448   GFRAA 54 (L) 10/13/2016 1405    BNP    Component Value Date/Time   BNP 488.3 (H) 10/06/2021 1541    ProBNP No results found for: "PROBNP"  Imaging: ECHOCARDIOGRAM COMPLETE BUBBLE STUDY  Result Date: 10/07/2021    ECHOCARDIOGRAM REPORT   Patient Name:   SHEWANDA SHARPE Date  of Exam: 10/07/2021 Medical Rec #:  [id number]        Height:       63.0 in Accession #:    [id number]       Weight:       283.0 lb Date of Birth:  Jul 11, 1945        BSA:          2.241 m Patient Age:    41 years         BP:           152/94 mmHg Patient Gender: F                HR:           71 bpm. Exam Location:  Inpatient Procedure: 2D Echo, Cardiac Doppler, Color Doppler and Saline Contrast Bubble            Study Indications:    Stroke  History:        Patient has prior history of Echocardiogram examinations, most                 recent 09/14/2011. CHF; Risk Factors:Hypertension, Diabetes, Sleep                 Apnea and Dyslipidemia.  Sonographer:    Eartha Inch Referring Phys: [id number] Vernelle Emerald  Sonographer Comments: Technically difficult study due to poor echo windows. Negative bubble study IMPRESSIONS  1. Left ventricular ejection fraction, by estimation, is 55 to 60%. The left ventricle has normal function. The left ventricle has no  regional wall motion abnormalities. Left ventricular diastolic function could not be evaluated.  2. Right ventricular systolic function is mildly reduced. The right ventricular size is normal.  3. The mitral valve is abnormal. Trivial mitral valve regurgitation.  4. The aortic valve was not well visualized. Aortic valve regurgitation is not visualized.  5. The inferior vena cava is normal in size with greater than 50% respiratory variability, suggesting right atrial pressure of 3 mmHg.  6. Agitated saline contrast bubble study was negative, with no evidence of any interatrial shunt. Comparison(s): Changes from prior study are noted. 09/14/2011: LVEF 55-60%, negative bubble study. FINDINGS  Left Ventricle: Left ventricular ejection fraction, by estimation, is 55 to 60%. The left ventricle has normal function. The left ventricle has no regional wall motion abnormalities. The left ventricular internal cavity size was normal in size. There is  no left ventricular hypertrophy. Left ventricular diastolic function could not be evaluated due to atrial fibrillation. Left ventricular diastolic function could not be evaluated. Right Ventricle: The right ventricular size is normal. No increase in right ventricular wall thickness. Right ventricular systolic function is mildly reduced. Left Atrium: Left atrial size was normal in size. Right Atrium: Right atrial size was normal in size. Pericardium: There is no evidence of pericardial effusion. Mitral Valve: The mitral valve is abnormal. Mild to moderate mitral annular calcification. Trivial mitral valve regurgitation. MV peak gradient, 6.6 mmHg. The mean mitral valve gradient is 3.0 mmHg. Tricuspid Valve: The tricuspid valve is not well visualized. Tricuspid valve regurgitation is not demonstrated. Aortic Valve: The aortic valve was not well visualized. Aortic valve regurgitation is not visualized. Pulmonic Valve: The pulmonic valve was not well visualized. Pulmonic valve  regurgitation is not visualized. Aorta: The aortic root and ascending aorta are structurally normal, with no evidence of dilitation. Venous: The inferior vena cava is normal in size with greater than 50% respiratory variability, suggesting right atrial pressure  of 3 mmHg. IAS/Shunts: No atrial level shunt detected by color flow Doppler. Agitated saline contrast was given intravenously to evaluate for intracardiac shunting. Agitated saline contrast bubble study was negative, with no evidence of any interatrial shunt.  LEFT VENTRICLE PLAX 2D LVIDd:         4.80 cm   Diastology LVIDs:         3.70 cm   LV e' medial:    5.40 cm/s LV PW:         1.00 cm   LV E/e' medial:  23.1 LV IVS:        0.80 cm   LV e' lateral:   6.30 cm/s LVOT diam:     1.70 cm   LV E/e' lateral: 19.8 LVOT Area:     2.27 cm  RIGHT VENTRICLE            IVC RV S prime:     7.80 cm/s  IVC diam: 1.80 cm LEFT ATRIUM             Index        RIGHT ATRIUM           Index LA diam:        3.80 cm 1.70 cm/m   RA Area:     15.10 cm LA Vol (A2C):   46.3 ml 20.66 ml/m  RA Volume:   31.90 ml  14.23 ml/m LA Vol (A4C):   61.9 ml 27.62 ml/m LA Biplane Vol: 56.8 ml 25.35 ml/m   AORTA Ao Root diam: 2.40 cm MITRAL VALVE MV Area (PHT): 3.48 cm     SHUNTS MV Peak grad:  6.6 mmHg     Systemic Diam: 1.70 cm MV Mean grad:  3.0 mmHg MV Vmax:       1.28 m/s MV Vmean:      79.7 cm/s MV Decel Time: 218 msec MV E velocity: 125.00 cm/s Lyman Bishop MD Electronically signed by Lyman Bishop MD Signature Date/Time: 10/07/2021/11:47:07 AM    Final    CT ANGIO HEAD NECK W WO CM  Result Date: 10/07/2021 CLINICAL DATA:  Follow-up examination for stroke. EXAM: CT ANGIOGRAPHY HEAD AND NECK TECHNIQUE: Multidetector CT imaging of the head and neck was performed using the standard protocol during bolus administration of intravenous contrast. Multiplanar CT image reconstructions and MIPs were obtained to evaluate the vascular anatomy. Carotid stenosis measurements (when  applicable) are obtained utilizing NASCET criteria, using the distal internal carotid diameter as the denominator. RADIATION DOSE REDUCTION: This exam was performed according to the departmental dose-optimization program which includes automated exposure control, adjustment of the mA and/or kV according to patient size and/or use of iterative reconstruction technique. CONTRAST:  20m OMNIPAQUE IOHEXOL 350 MG/ML SOLN COMPARISON:  CT and MRI from earlier the same day. FINDINGS: CTA NECK FINDINGS Aortic arch: Visualized aortic arch normal in caliber with standard 3 vessel morphology. Mild atheromatous change about the arch itself. No stenosis about the origin the great vessels. Right carotid system: Right common and internal carotid arteries are tortuous. Moderate calcified plaque about the right carotid bulb without hemodynamically significant stenosis. No dissection or occlusion. Left carotid system: Left common and internal carotid arteries are tortuous. Mild atheromatous change about the left carotid bulb without hemodynamically significant stenosis. No dissection or occlusion. Vertebral arteries: Both vertebral arteries arise from subclavian arteries. No proximal subclavian artery stenosis. Right vertebral artery dominant. Vertebral arteries patent without stenosis or dissection. Skeleton: No discrete or worrisome osseous lesions. Moderate degenerative  spurring noted throughout the visualized cervicothoracic spine. Moderate to advanced osteoarthritic changes present about the C1-2 articulation. Other neck: No other acute soft tissue abnormality within the neck. Few scattered thyroid nodules noted, largest of which measures up to 2.3 cm on the left and is partially calcified. Upper chest: Layering left pleural effusion, partially visualized. No other acute abnormality within the visualized upper chest. Review of the MIP images confirms the above findings CTA HEAD FINDINGS Anterior circulation: Petrous segments  patent bilaterally. Atheromatous plaque within the carotid siphons without hemodynamically significant stenosis. A1 segments patent bilaterally. Normal anterior communicating artery complex. Anterior cerebral arteries widely patent without stenosis. No M1 stenosis or occlusion. No proximal MCA branch occlusion or stenosis. Distal MCA branches perfused and symmetric. Posterior circulation: Atheromatous plaque within the dominant proximal right V4 segment with associated mild stenosis (series 7, image 142). Hypoplastic left V4 segment widely patent. Left PICA patent. Right PICA not well seen. Basilar patent to its distal aspect without stenosis. Superior cerebral arteries patent bilaterally. Left PCA supplied via a hypoplastic left P1 segment and robust left posterior communicating artery. Fetal type origin right PCA. Both PCAs patent to their distal aspects without stenosis. Venous sinuses: Patent allowing for timing the contrast bolus. Anatomic variants: As above.  No aneurysm. Review of the MIP images confirms the above findings IMPRESSION: 1. Negative CTA for large vessel occlusion or other emergent finding. 2. Mild-to-moderate atheromatous change about the carotid bifurcations and carotid siphons without hemodynamically significant or correctable stenosis. 3. Diffuse tortuosity of the major arterial vasculature of the head and neck, suggesting chronic underlying hypertension. 4. Layering left pleural effusion, partially visualized. 5. 2.3 cm left thyroid nodule, indeterminate. Further evaluation with dedicated thyroid ultrasound recommended. This could be performed on a nonemergent outpatient basis (Ref: J Am Coll Radiol. 2015 Feb;12(2): 143-50). Electronically Signed   By: Jeannine Boga M.D.   On: 10/07/2021 00:24   MR ANGIO HEAD WO CONTRAST  Result Date: 10/06/2021 CLINICAL DATA:  Stroke/TIA. Abnormal MR head with cortical infarcts of the right frontal operculum and focal infarct involving the  splenium of the corpus callosum. EXAM: MRA HEAD WITHOUT CONTRAST TECHNIQUE: Angiographic images of the Circle of Willis were acquired using MRA technique without intravenous contrast. COMPARISON:  MR head without contrast 10/06/2021 FINDINGS: Anterior circulation: Internal carotid arteries are within normal limits the high cervical segments through the ICA termini bilaterally. The A1 and M1 segments are normal. No definite anterior communicating artery is present. MCA bifurcations are within normal limits. The ACA and MCA branch vessels are normal. Posterior circulation: Right vertebral artery is dominant vessel. Vertebrobasilar junction basilar artery are. Fetal type posterior cerebral arteries are present bilaterally. Small P1 segments are noted. PCA branch vessels are within normal limits bilaterally. Anatomic variants: Bilateral fetal type posterior cerebral arteries. Other: None. IMPRESSION: 1. Normal MRA circle-of-Willis. No significant proximal stenosis, aneurysm, or branch vessel occlusion. The up Electronically Signed   By: San Morelle M.D.   On: 10/06/2021 18:54   MR BRAIN WO CONTRAST  Result Date: 10/06/2021 CLINICAL DATA:  TIA. Sudden onset of confusion at 1:30 this afternoon. EXAM: MRI HEAD WITHOUT CONTRAST TECHNIQUE: Multiplanar, multiecho pulse sequences of the brain and surrounding structures were obtained without intravenous contrast. COMPARISON:  CT head without contrast 10/06/2021. FINDINGS: Brain: Diffusion-weighted images demonstrate focal area of restricted cortical diffusion in the right frontal operculum punctate focus of restricted diffusion is also present within the splenium of the corpus callosum. Subtle FLAIR signal is present both locations.  Moderate generalized atrophy and diffuse white matter changes are present otherwise. White matter changes extend into the brainstem. Cerebellum is unremarkable. Vascular: Flow is present in the major intracranial arteries. The  craniocervical junction is normal. Upper cervical spine is within normal limits. Marrow signal is unremarkable. Skull and upper cervical spine: Degenerative changes are present in the upper cervical spine. Prominent soft tissue is present posterior to the dens. Craniocervical junction is otherwise within normal limits. Sinuses/Orbits: Bilateral mastoid effusions are present. No obstructing nasopharyngeal lesion is present. Paranasal sinuses otherwise clear. Bilateral lens replacements are noted. Globes and orbits are otherwise unremarkable. IMPRESSION: 1. Acute/subacute cortical nonhemorrhagic infarct involving the right frontal operculum. 2. Acute subacute punctate nonhemorrhagic infarct in the splenium of the corpus callosum. 3. Question central embolic etiology. 4. Moderate generalized atrophy and diffuse white matter disease likely reflects the sequela of chronic microvascular ischemia. 5. Bilateral mastoid effusions. No obstructing nasopharyngeal lesion is present. Electronically Signed   By: San Morelle M.D.   On: 10/06/2021 18:48   CT Head Wo Contrast  Result Date: 10/06/2021 CLINICAL DATA:  Altered mental status. EXAM: CT HEAD WITHOUT CONTRAST TECHNIQUE: Contiguous axial images were obtained from the base of the skull through the vertex without intravenous contrast. RADIATION DOSE REDUCTION: This exam was performed according to the departmental dose-optimization program which includes automated exposure control, adjustment of the mA and/or kV according to patient size and/or use of iterative reconstruction technique. COMPARISON:  02/08/2012 FINDINGS: Brain: No evidence of intracranial hemorrhage, acute infarction, hydrocephalus, extra-axial collection, or mass lesion/mass effect. Mild cerebral atrophy and moderate chronic small vessel disease shows progression since prior exam. Vascular:  No hyperdense vessel or other acute findings. Skull: No evidence of fracture or other significant bone  abnormality. Sinuses/Orbits:  No acute findings. Other: None. IMPRESSION: No acute intracranial abnormality. Cerebral atrophy and chronic small vessel disease. Electronically Signed   By: Marlaine Hind M.D.   On: 10/06/2021 17:24          No data to display          No results found for: "NITRICOXIDE"      Assessment & Plan:   OSA (obstructive sleep apnea) Reported history of severe sleep apnea with sleep study greater than 15 years ago.  Patient has not used CPAP in greater than 15 years.  We will need a new diagnostic sleep study.  We will set patient up for a split-night sleep study.  Patient will need an in lab study as she recently had a stroke.  Recently new diagnosed A-fib. Patient education on sleep apnea to patient and daughter. - discussed how weight can impact sleep and risk for sleep disordered breathing - discussed options to assist with weight loss: combination of diet modification, cardiovascular and strength training exercises   - had an extensive discussion regarding the adverse health consequences related to untreated sleep disordered breathing - specifically discussed the risks for hypertension, coronary artery disease, cardiac dysrhythmias, cerebrovascular disease, and diabetes - lifestyle modification discussed   - discussed how sleep disruption can increase risk of accidents, particularly when driving - safe driving practices were discussed   Plan   Acute ischemic stroke [facility]) Recent hospitalization with acute ischemic stroke.  She has increased generalized weakness and some mild speech impairment.  To begin home PT as planned. Follow up with Neurology as planned   Plan  Patient Instructions  Set up for Spilt night sleep study -urgent referral  Healthy sleep regimen  Do not drive if  sleepy  Work on healthy weight loss  Follow up in 4-6 weeks to discuss results and treatment plan       Paroxysmal atrial fibrillation ([place]) New onset A-fib.   Continue on metoprolol and Eliquis.  Follow-up with cardiology as planned    I spent   45 minutes dedicated to the care of this patient on the date of this encounter to include pre-visit review of records, face-to-face time with the patient discussing conditions above, post visit ordering of testing, clinical documentation with the electronic health record, making appropriate referrals as documented, and communicating necessary findings to members of the patients care team.   Rexene Edison, NP 10/11/2021

## 2021-10-11 NOTE — Addendum Note (Signed)
Addended by: Alvin Critchley on: 10/11/2021 02:37 PM   Modules accepted: Orders

## 2021-10-11 NOTE — Assessment & Plan Note (Signed)
Reported history of severe sleep apnea with sleep study greater than 15 years ago.  Patient has not used CPAP in greater than 15 years.  We will need a new diagnostic sleep study.  We will set patient up for a split-night sleep study.  Patient will need an in lab study as she recently had a stroke.  Recently new diagnosed A-fib. Patient education on sleep apnea to patient and daughter. - discussed how weight can impact sleep and risk for sleep disordered breathing - discussed options to assist with weight loss: combination of diet modification, cardiovascular and strength training exercises   - had an extensive discussion regarding the adverse health consequences related to untreated sleep disordered breathing - specifically discussed the risks for hypertension, coronary artery disease, cardiac dysrhythmias, cerebrovascular disease, and diabetes - lifestyle modification discussed   - discussed how sleep disruption can increase risk of accidents, particularly when driving - safe driving practices were discussed   Plan

## 2021-10-14 NOTE — Progress Notes (Signed)
Reviewed and agree with assessment/plan.   Chesley Mires, MD [facility] Pulmonary/Critical Care 10/14/2021, 8:49 AM Pager:  [phone]

## 2021-11-05 ENCOUNTER — Other Ambulatory Visit (HOSPITAL_COMMUNITY): Payer: Self-pay

## 2021-11-05 ENCOUNTER — Telehealth (HOSPITAL_COMMUNITY): Payer: Self-pay

## 2021-11-05 NOTE — Telephone Encounter (Signed)
Transitions of Care Pharmacy   Call attempted for a pharmacy transitions of care follow-up. No voicemail box set up  Call attempt #1. Will follow-up in 2-3 days.   Maryan Puls, PharmD PGY-1 [facility] Pharmacy Resident

## 2021-11-08 ENCOUNTER — Telehealth: Payer: Self-pay | Admitting: Physician Assistant

## 2021-11-08 MED ORDER — APIXABAN 5 MG PO TABS: 5.0000 mg | ORAL_TABLET | Freq: Two times a day (BID) | ORAL | 0 refills | Status: DC

## 2021-11-08 NOTE — Telephone Encounter (Signed)
Refill sent to pharmacy as requested, with note that patient must keep upcoming appt for further refills.

## 2021-11-08 NOTE — Telephone Encounter (Signed)
Pt c/o medication issue:  1. Name of Medication: apixaban (ELIQUIS) 5 MG TABS tablet (Expired)  2. How are you currently taking this medication (dosage and times per day)? Take 1 tablet (5 mg total) by mouth 2 (two) times daily.  3. Are you having a reaction (difficulty breathing--STAT)? No   4. What is your medication issue? Patient needs a new prescription sent in to  [place] [place] [facility]

## 2021-11-12 ENCOUNTER — Ambulatory Visit (HOSPITAL_BASED_OUTPATIENT_CLINIC_OR_DEPARTMENT_OTHER): Payer: Medicare HMO | Attending: Adult Health | Admitting: Pulmonary Disease

## 2021-11-12 DIAGNOSIS — E119 Type 2 diabetes mellitus without complications: Secondary | ICD-10-CM | POA: Diagnosis not present

## 2021-11-12 DIAGNOSIS — I639 Cerebral infarction, unspecified: Secondary | ICD-10-CM | POA: Diagnosis not present

## 2021-11-12 DIAGNOSIS — E669 Obesity, unspecified: Secondary | ICD-10-CM | POA: Diagnosis not present

## 2021-11-12 DIAGNOSIS — I493 Ventricular premature depolarization: Secondary | ICD-10-CM | POA: Insufficient documentation

## 2021-11-12 DIAGNOSIS — I4891 Unspecified atrial fibrillation: Secondary | ICD-10-CM | POA: Insufficient documentation

## 2021-11-12 DIAGNOSIS — I10 Essential (primary) hypertension: Secondary | ICD-10-CM | POA: Insufficient documentation

## 2021-11-12 DIAGNOSIS — R5383 Other fatigue: Secondary | ICD-10-CM | POA: Insufficient documentation

## 2021-11-12 DIAGNOSIS — G4733 Obstructive sleep apnea (adult) (pediatric): Secondary | ICD-10-CM | POA: Insufficient documentation

## 2021-11-12 DIAGNOSIS — Z6841 Body Mass Index (BMI) 40.0 and over, adult: Secondary | ICD-10-CM | POA: Insufficient documentation

## 2021-11-16 DIAGNOSIS — G4733 Obstructive sleep apnea (adult) (pediatric): Secondary | ICD-10-CM | POA: Diagnosis not present

## 2021-11-16 NOTE — Procedures (Signed)
      Patient Name: Jade Ellison, Jade Ellison Date: 11/12/2021 Gender: Female D.O.B: 08-11-1945 Age (years): 44 Referring Provider: Lynelle Smoke Parrett Height (inches): 63 Interpreting Physician: Chesley Mires MD, ABSM Weight (lbs): 260 RPSGT: Carolin Coy BMI: 57 MRN: [id number] Neck Size: 18.50  CLINICAL INFORMATION Sleep Study Type: NPSG  Indication for sleep study: Diabetes, Fatigue, Hypertension, Obesity, OSA, Snoring  Epworth Sleepiness Score: 4  SLEEP STUDY TECHNIQUE As per the AASM Manual for the Scoring of Sleep and Associated Events v2.3 (April 2016) with a hypopnea requiring 4% desaturations.  The channels recorded and monitored were frontal, central and occipital EEG, electrooculogram (EOG), submentalis EMG (chin), nasal and oral airflow, thoracic and abdominal wall motion, anterior tibialis EMG, snore microphone, electrocardiogram, and pulse oximetry.  MEDICATIONS Medications self-administered by patient taken the night of the study : N/A  SLEEP ARCHITECTURE The study was initiated at 11:21:46 PM and ended at 5:20:15 AM.  Sleep onset time was 11.5 minutes and the sleep efficiency was 86.9%%. The total sleep time was 311.5 minutes.  Stage REM latency was N/A minutes.  The patient spent 12.5%% of the night in stage N1 sleep, 87.5%% in stage N2 sleep, 0.0%% in stage N3 and 0% in REM.  Alpha intrusion was absent.  Supine sleep was 100.00%.  RESPIRATORY PARAMETERS The overall apnea/hypopnea index (AHI) was 7.9 per hour. There were 16 total apneas, including 16 obstructive, 0 central and 0 mixed apneas. There were 25 hypopneas and 35 RERAs.  The AHI during Stage REM sleep was N/A per hour.  AHI while supine was 7.9 per hour.  The mean oxygen saturation was 94.4%. The minimum SpO2 during sleep was 91.0%.  moderate snoring was noted during this study.  CARDIAC DATA The 2 lead EKG demonstrated sinus rhythm. The mean heart rate was 82.6 beats per minute. Other  EKG findings include: Atrial Fibrillation, PVCs.  LEG MOVEMENT DATA The total PLMS were 0 with a resulting PLMS index of 0.0. Associated arousal with leg movement index was 0.0 .  IMPRESSIONS - Mild obstructive sleep apnea with an AHI of 7.9 and SpO2 low of 91%. - The patient snored with moderate snoring volume. - EKG findings include Atrial Fibrillation, PVCs.  She has a known history of Atrial Fibrillation.  DIAGNOSIS - Obstructive Sleep Apnea (G47.33)  RECOMMENDATIONS - Additional therapies include weight loss, CPAP, oral appliance, or surgical assessment. - Avoid alcohol, sedatives and other CNS depressants that may worsen sleep apnea and disrupt normal sleep architecture. - Sleep hygiene should be reviewed to assess factors that may improve sleep quality.  [Electronically signed] 11/16/2021 05:55 PM  Chesley Mires MD, ABSM Diplomate, American Board of Sleep Medicine NPI: [id number]  [place] PH: [phone]   FX: [phone] [place]

## 2021-11-19 ENCOUNTER — Telehealth: Payer: Self-pay | Admitting: Adult Health

## 2021-11-19 NOTE — Telephone Encounter (Signed)
ATC home number on file--unable to leave vm due to mailbox not being setup.  Lm for patient on mobile number on file.

## 2021-11-19 NOTE — Telephone Encounter (Signed)
NPSG 11/12/21  shows Mild obstructive sleep apnea with an AHI of 7.9 and SpO2 low of 91%. .  Please set up an office visit or virtual visit to discuss sleep study results and treatment plan

## 2021-11-20 NOTE — Telephone Encounter (Addendum)
Spoke to patient and relayed results. She voiced her understanding.  Mychart visit scheduled 11/22/2021 at 4:00. Nothing further needed.

## 2021-11-22 ENCOUNTER — Telehealth (INDEPENDENT_AMBULATORY_CARE_PROVIDER_SITE_OTHER): Payer: Medicare HMO | Admitting: Adult Health

## 2021-11-22 ENCOUNTER — Encounter: Payer: Self-pay | Admitting: Adult Health

## 2021-11-22 DIAGNOSIS — I480 Paroxysmal atrial fibrillation: Secondary | ICD-10-CM | POA: Diagnosis not present

## 2021-11-22 DIAGNOSIS — G4733 Obstructive sleep apnea (adult) (pediatric): Secondary | ICD-10-CM | POA: Diagnosis not present

## 2021-11-22 NOTE — Progress Notes (Signed)
Virtual Visit via Video Note  I connected with Jade Ellison on 11/22/21 at  4:00 PM EST by a video enabled telemedicine application and verified that I am speaking with the correct person using two identifiers.  Location: Patient: Home  Provider: Office    I discussed the limitations of evaluation and management by telemedicine and the availability of in person appointments. The patient expressed understanding and agreed to proceed.  History of Present Illness: 76 year old female seen for sleep consult October 11, 2021 to establish for sleep apnea Medical history significant for acute stroke and A-fib consultation September 2023, history of diabetes and diastolic heart failure  Today's video visit is a 6-week follow-up.  Patient was seen last visit to establish for sleep apnea.  She had had difficulty tolerating her CPAP in the past.  She was recently hospitalized in September for A-fib and stroke.  Patient was set up for a in lab sleep study that showed mild sleep apnea with AHI at 7.9/hour and SPO2 low at 91%.  We discussed her sleep study results in detail.  With her history of stroke and A-fib along with  daytime sleepiness and loud snoring we talked about retrying CPAP again.  Patient says that she would like to do this.  She wants to try a smaller nasal mask as she is claustrophobic and does not like the full facemask.  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 pylori)    Hemorrhoids    Hernia of  unspecified site of abdominal cavity without mention of obstruction or gangrene    History of blood transfusion    History of kidney stones    Hypertension 09 07 2013   TRANSTHORACIC ECHO STUDY CONCLUSIONS    Hypertension 09 07 2013   EJECTION FRACTION- 55%-60% .WALL MOTION WAS NORMAL   Impaired mobility    Iron deficiency anemia    Morbid obesity (HCC)    Neuropathy    Obesity    Olecranon bursitis of left elbow    OSA (obstructive sleep apnea)    On cpap   Osteoarthritis    right hip   Osteoporosis    Primary osteoarthritis    Bilaterally (knee)   PSVT (paroxysmal supraventricular tachycardia)    PSVT (paroxysmal supraventricular tachycardia)    Short-term memory loss    Shortness of breath dyspnea    Sleep apnea    on CPAP- does not use machine   Spinal headache 1986   Type 2 diabetes mellitus ([place])    Current Outpatient Medications on File Prior to Visit  Medication Sig Dispense Refill   acetaminophen (TYLENOL) 325 MG tablet Take 650 mg by mouth every 6 (six) hours as needed for moderate pain or headache.     ALPRAZolam (XANAX) 0.25 MG tablet Take 0.25 mg by mouth 2 (two) times daily as needed for anxiety.  apixaban (ELIQUIS) 5 MG TABS tablet Take 1 tablet (5 mg total) by mouth 2 (two) times daily. MUST KEEP UPCOMING APPT FOR FURTHER REFILLS 60 tablet 0   atorvastatin (LIPITOR) 20 MG tablet Take 20 mg by mouth daily.     Cyanocobalamin (B-12 PO) Take 1 capsule by mouth daily.     desonide (DESOWEN) 0.05 % cream Apply 1 application topically 2 (two) times daily as needed (dry skin/irritation).      dicyclomine (BENTYL) 20 MG tablet TAKE 1 TABLET(20 MG) BY MOUTH THREE TIMES DAILY AFTER MEALS (Patient taking differently: Take 20 mg by mouth 2 (two) times daily.) 90 tablet 0   DULoxetine (CYMBALTA) 60 MG capsule Take 60 mg by mouth daily.     ferrous sulfate 325 (65 FE) MG tablet Take 325 mg by mouth daily.     glipiZIDE (GLUCOTROL XL) 5 MG 24 hr tablet Take 5 mg by mouth  daily.     latanoprost (XALATAN) 0.005 % ophthalmic solution Place 1 drop into both eyes at bedtime.      losartan-hydrochlorothiazide (HYZAAR) 100-12.5 MG tablet Take 1 tablet by mouth daily.  11   metoprolol tartrate (LOPRESSOR) 50 MG tablet Take 50 mg by mouth 2 (two) times daily.     Mouthwashes (BIOTENE/CALCIUM MT) Take 1 Dose by mouth daily as needed (dry mouth).      Multiple Vitamin (MULTIVITAMIN WITH MINERALS) TABS tablet Take 1 tablet by mouth daily.     omeprazole (PRILOSEC) 40 MG capsule TAKE 1 CAPSULE(40 MG) BY MOUTH DAILY (Patient taking differently: Take 40 mg by mouth daily.) 90 capsule 0   pregabalin (LYRICA) 50 MG capsule Take 50 mg by mouth 2 (two) times daily.     psyllium (METAMUCIL SMOOTH TEXTURE) 28 % packet Take 1 packet by mouth daily as needed (constipation).      RYBELSUS 3 MG TABS Take 1 tablet by mouth daily.     saccharomyces boulardii (FLORASTOR) 250 MG capsule Take 1 capsule (250 mg total) by mouth daily. 30 capsule 0   Vitamin D, Ergocalciferol, (DRISDOL) 1.25 MG (50000 UNIT) CAPS capsule Take 50,000 Units by mouth every Wednesday.     No current facility-administered medications on file prior to visit.      Observations/Objective: NPSG November 12, 2021- Mild obstructive sleep apnea with an AHI of 7.9 and SpO2 low of 91%.   Assessment and Plan: Mild obstructive sleep apnea.  Patient has significant comorbidities including A-fib and previous stroke.  Patient should begin CPAP therapy. We will begin auto CPAP 5 to 15 cm H2O.  Please use DreamWear nasal mask.  Hx of A Fib -on Eliquis.  Continue follow-up with cardiology  Plan  . Patient Instructions  Begin CPAP at bedtime and with naps, wear all night long for at least 6 or more hours Healthy sleep regimen  Do not drive if sleepy  Work on healthy weight loss  Follow-up in 3 months with Dr Ander Slade and As needed        Follow Up Instructions:    I discussed the assessment and treatment plan with  the patient. The patient was provided an opportunity to ask questions and all were answered. The patient agreed with the plan and demonstrated an understanding of the instructions.   The patient was advised to call back or seek an in-person evaluation if the symptoms worsen or if the condition fails to improve as anticipated.  I provided 20  minutes of non-face-to-face time during this encounter.  Rexene Edison, NP

## 2021-11-22 NOTE — Patient Instructions (Signed)
Begin CPAP at bedtime and with naps, wear all night long for at least 6 or more hours Healthy sleep regimen  Do not drive if sleepy  Work on healthy weight loss  Follow-up in 3 months with Dr Ander Slade and As needed

## 2021-11-22 NOTE — Addendum Note (Signed)
Addended by: Vanessa Barbara on: 11/22/2021 04:50 PM   Modules accepted: Orders

## 2021-11-26 NOTE — Progress Notes (Signed)
Cardiology Office Note:   Date:  11/29/2021  NAME:  Jade Ellison    MRN: [id number] DOB:  07-06-1945   PCP:  Bernerd Limbo, MD  Cardiologist:  None  Electrophysiologist:  None   Referring MD: Bernerd Limbo, MD   Chief Complaint  Patient presents with   Follow-up        History of Present Illness:   Jade Ellison is a 76 y.o. female with a hx of below history who presents for follow-up. Doing well. No symptoms from her Afib. Rate controlled. On eliquis without issues. Mild OSA, awaiting for CPAP. Recovering from stroke. Some weakness in R leg but improving.   Problem List DM -A1c 7.2 -T chol 91, HDL 44, LDL 32, TG 77 OSA -mild  HTN CVA -09/2021 5. 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 pylori)    Hemorrhoids    Hernia of unspecified site of abdominal cavity without mention of obstruction or gangrene    History of blood transfusion    History of kidney stones    Hypertension 09 07 2013   TRANSTHORACIC ECHO STUDY CONCLUSIONS    Hypertension 09 07 2013   EJECTION FRACTION- 55%-60% .WALL MOTION WAS NORMAL   Impaired mobility    Iron deficiency anemia    Morbid obesity (HCC)    Neuropathy    Obesity    Olecranon bursitis of left elbow    OSA (obstructive sleep apnea)    On cpap   Osteoarthritis    right hip   Osteoporosis    Primary osteoarthritis    Bilaterally (knee)   PSVT  (paroxysmal supraventricular tachycardia)    PSVT (paroxysmal supraventricular tachycardia)    Short-term memory loss    Shortness of breath dyspnea    Sleep apnea    on CPAP- does not use machine   Spinal headache 1986   Type 2 diabetes mellitus ([place])     Past Surgical History: Past Surgical History:  Procedure Laterality Date   BIOPSY  11/07/2019   Procedure: BIOPSY;  Surgeon: Mauri Pole, MD;  Location: [facility];  Service: Endoscopy;;   CARDIAC CATHETERIZATION  2003    normal coronary arteries and nomal LV function by cath performed by Dr.Peter Mainville WITH PROPOFOL N/A 11/07/2019   Procedure: COLONOSCOPY WITH PROPOFOL;  Surgeon: Mauri Pole, MD;  Location: [facility];  Service: Endoscopy;  Laterality: N/A;   ENTEROSCOPY  02/05/2012   Procedure: ENTEROSCOPY;  Surgeon: Lafayette Dragon, MD;  Location: [facility];  Service: Endoscopy;  Laterality: N/A;   ESOPHAGOGASTRODUODENOSCOPY N/A 04/19/2014   Procedure: ESOPHAGOGASTRODUODENOSCOPY (EGD);  Surgeon: Lafayette Dragon, MD;  Location: [facility];  Service: Endoscopy;  Laterality: N/A;  ESOPHAGOGASTRODUODENOSCOPY (EGD) WITH PROPOFOL N/A 08/01/2015   Procedure: ESOPHAGOGASTRODUODENOSCOPY (EGD) WITH PROPOFOL;  Surgeon: Mauri Pole, MD;  Location: [facility];  Service: Endoscopy;  Laterality: N/A;   ESOPHAGOGASTRODUODENOSCOPY (EGD) WITH PROPOFOL N/A 11/07/2019   Procedure: ESOPHAGOGASTRODUODENOSCOPY (EGD) WITH PROPOFOL;  Surgeon: Mauri Pole, MD;  Location: [facility];  Service: Endoscopy;  Laterality: N/A;   HERNIA REPAIR     HOT HEMOSTASIS  02/05/2012   Procedure: HOT HEMOSTASIS (ARGON PLASMA COAGULATION/BICAP);  Surgeon: Lafayette Dragon, MD;  Location: [facility];  Service: Endoscopy;  Laterality: N/A;   KNEE ARTHROSCOPY Left    left sided lithotripsy     POLYPECTOMY  11/07/2019   Procedure: POLYPECTOMY;  Surgeon: Mauri Pole, MD;   Location: [facility];  Service: Endoscopy;;   TUBAL LIGATION      Current Medications: Current Meds  Medication Sig   acetaminophen (TYLENOL) 325 MG tablet Take 650 mg by mouth every 6 (six) hours as needed for moderate pain or headache.   ALPRAZolam (XANAX) 0.25 MG tablet Take 0.25 mg by mouth 2 (two) times daily as needed for anxiety.    apixaban (ELIQUIS) 5 MG TABS tablet Take 1 tablet (5 mg total) by mouth 2 (two) times daily. MUST KEEP UPCOMING APPT FOR FURTHER REFILLS   atorvastatin (LIPITOR) 20 MG tablet Take 20 mg by mouth daily.   Cyanocobalamin (B-12 PO) Take 1 capsule by mouth daily.   desonide (DESOWEN) 0.05 % cream Apply 1 application topically 2 (two) times daily as needed (dry skin/irritation).    dicyclomine (BENTYL) 20 MG tablet TAKE 1 TABLET(20 MG) BY MOUTH THREE TIMES DAILY AFTER MEALS (Patient taking differently: Take 20 mg by mouth 2 (two) times daily.)   DULoxetine (CYMBALTA) 60 MG capsule Take 60 mg by mouth daily.   ferrous sulfate 325 (65 FE) MG tablet Take 325 mg by mouth daily.   glipiZIDE (GLUCOTROL XL) 5 MG 24 hr tablet Take 5 mg by mouth daily.   latanoprost (XALATAN) 0.005 % ophthalmic solution Place 1 drop into both eyes at bedtime.    losartan-hydrochlorothiazide (HYZAAR) 100-12.5 MG tablet Take 1 tablet by mouth daily.   metoprolol tartrate (LOPRESSOR) 50 MG tablet Take 50 mg by mouth 2 (two) times daily.   Mouthwashes (BIOTENE/CALCIUM MT) Take 1 Dose by mouth daily as needed (dry mouth).    Multiple Vitamin (MULTIVITAMIN WITH MINERALS) TABS tablet Take 1 tablet by mouth daily.   omeprazole (PRILOSEC) 40 MG capsule TAKE 1 CAPSULE(40 MG) BY MOUTH DAILY (Patient taking differently: Take 40 mg by mouth daily.)   pregabalin (LYRICA) 50 MG capsule Take 50 mg by mouth 2 (two) times daily.   psyllium (METAMUCIL SMOOTH TEXTURE) 28 % packet Take 1 packet by mouth daily as needed (constipation).    RYBELSUS 3 MG TABS Take 1 tablet by mouth daily.   saccharomyces  boulardii (FLORASTOR) 250 MG capsule Take 1 capsule (250 mg total) by mouth daily.   Vitamin D, Ergocalciferol, (DRISDOL) 1.25 MG (50000 UNIT) CAPS capsule Take 50,000 Units by mouth every Wednesday.     Allergies:    Neurontin [gabapentin], Codeine, Fosamax [alendronate sodium], and Nsaids   Social History: Social History   Socioeconomic History   Marital status: Widowed    Spouse name: Not on file   Number of children: 1   Years of education: Not on file   Highest education level: Some college, no degree  Occupational History   Occupation: RETIRED    Employer: RETIRED  Tobacco Use  Smoking status: Never   Smokeless tobacco: Never  Vaping Use   Vaping Use: Never used  Substance and Sexual Activity   Alcohol use: Yes    Alcohol/week: 1.0 standard drink of alcohol    Types: 1 Glasses of wine per week    Comment: wine sometimes on holidays and bedtime- rarely   Drug use: No   Sexual activity: Not on file  Other Topics Concern   Not on file  Social History Narrative   04/29/18 Lives alone, dgtr visits, stays often   Caffeine- coffee once a week, sodas rarely   Social Determinants of Health   Financial Resource Strain: Not on file  Food Insecurity: Not on file  Transportation Needs: Not on file  Physical Activity: Not on file  Stress: Not on file  Social Connections: Not on file     Family History: The patient's family history includes Diabetes in her brother and sister; Heart disease in her father and mother; Kidney cancer in her brother. There is no history of Colon cancer.  ROS:   All other ROS reviewed and negative. Pertinent positives noted in the HPI.     EKGs/Labs/Other Studies Reviewed:   The following studies were personally reviewed by me today:  Recent Labs: 10/06/2021: B Natriuretic Peptide 488.3 10/07/2021: ALT 12; BUN 13; Creatinine, Ser 0.99; Hemoglobin 11.2; Magnesium 1.1; Platelets 182; Potassium 3.5; Sodium 139 10/08/2021: TSH 1.320   Recent  Lipid Panel    Component Value Date/Time   CHOL 91 10/07/2021 0448   TRIG 77 10/07/2021 0448   HDL 44 10/07/2021 0448   CHOLHDL 2.1 10/07/2021 0448   VLDL 15 10/07/2021 0448   LDLCALC 32 10/07/2021 0448    Physical Exam:   VS:  BP 131/74   Pulse 89   Ht '5\' 3"'$  (1.6 m)   SpO2 98%   BMI 46.06 kg/m    Wt Readings from Last 3 Encounters:  11/12/21 260 lb (117.9 kg)  10/11/21 260 lb (117.9 kg)  10/06/21 283 lb (128.4 kg)    General: Well nourished, well developed, in no acute distress Head: Atraumatic, normal size  Eyes: PEERLA, EOMI  Neck: Supple, no JVD Endocrine: No thryomegaly Cardiac: Normal S1, S2; irregular rhythm  Lungs: Clear to auscultation bilaterally, no wheezing, rhonchi or rales  Abd: Soft, nontender, no hepatomegaly  Ext: No edema, pulses 2+ Musculoskeletal: No deformities, BUE and BLE strength normal and equal Skin: Warm and dry, no rashes   Neuro: Alert and oriented to person, place, time, and situation, CNII-XII grossly intact, no focal deficits  Psych: Normal mood and affect   ASSESSMENT:   SHYLIN KEIZER is a 76 y.o. female who presents for the following: 1. Persistent atrial fibrillation ([place])   2. Acquired thrombophilia ([place])     PLAN:   1. Persistent atrial fibrillation ([place]) 2. Acquired thrombophilia ([place]) -New diagnosis of A-fib in the setting of stroke.  Currently rate controlled.  Ejection fraction is normal.  Denies any symptoms.  Continue metoprolol succinate 25 mg daily.  Rate is well controlled.  Continue Eliquis 5 mg twice daily.  No bleeding issues.  -Given no symptoms would recommend to continue with rate control strategy. -She may need dental work and I informed her it is okay to proceed with dental work.  Would recommend she just continue through dental work.  She will let us know.  She will see me back yearly.  Echo was normal.  Normal thyroid studies.      Disposition:  Return in about 6 months (around 05/30/2022).  Medication  Adjustments/Labs and Tests Ordered: Current medicines are reviewed at length with the patient today.  Concerns regarding medicines are outlined above.  No orders of the defined types were placed in this encounter.  No orders of the defined types were placed in this encounter.   Patient Instructions  Medication Instructions:  The current medical regimen is effective;  continue present plan and medications.  *If you need a refill on your cardiac medications before your next appointment, please call your pharmacy*   Follow-Up: At [facility], you and your health needs are our priority.  As part of our continuing mission to provide you with exceptional heart care, we have created designated Provider Care Teams.  These Care Teams include your primary Cardiologist (physician) and Advanced Practice Providers (APPs -  Physician Assistants and Nurse Practitioners) who all work together to provide you with the care you need, when you need it.  We recommend signing up for the patient portal called "MyChart".  Sign up information is provided on this After Visit Summary.  MyChart is used to connect with patients for Virtual Visits (Telemedicine).  Patients are able to view lab/test results, encounter notes, upcoming appointments, etc.  Non-urgent messages can be sent to your provider as well.   To learn more about what you can do with MyChart, go to [email or web address].    Your next appointment:   12 month(s)  The format for your next appointment:   In Person  Provider:   Eleonore Chiquito, MD         Time Spent with Patient: I have spent a total of 25 minutes with patient reviewing hospital notes, telemetry, EKGs, labs and examining the patient as well as establishing an assessment and plan that was discussed with the patient.  > 50% of time was spent in direct patient care.  Signed, Addison Naegeli. Audie Box, MD, [place]  [address] [place], [place] [address] [phone]  11/29/2021 9:50 AM

## 2021-11-29 ENCOUNTER — Encounter: Payer: Self-pay | Admitting: Cardiovascular Disease

## 2021-11-29 ENCOUNTER — Ambulatory Visit: Payer: Medicare HMO | Attending: Cardiovascular Disease | Admitting: Cardiovascular Disease

## 2021-11-29 VITALS — BP 131/74 | HR 89 | Ht 63.0 in

## 2021-11-29 DIAGNOSIS — D6869 Other thrombophilia: Secondary | ICD-10-CM

## 2021-11-29 DIAGNOSIS — I4819 Other persistent atrial fibrillation: Secondary | ICD-10-CM

## 2021-11-29 NOTE — Patient Instructions (Signed)
Medication Instructions:  The current medical regimen is effective;  continue present plan and medications.  *If you need a refill on your cardiac medications before your next appointment, please call your pharmacy*   Follow-Up: At [facility], you and your health needs are our priority.  As part of our continuing mission to provide you with exceptional heart care, we have created designated Provider Care Teams.  These Care Teams include your primary Cardiologist (physician) and Advanced Practice Providers (APPs -  Physician Assistants and Nurse Practitioners) who all work together to provide you with the care you need, when you need it.  We recommend signing up for the patient portal called "MyChart".  Sign up information is provided on this After Visit Summary.  MyChart is used to connect with patients for Virtual Visits (Telemedicine).  Patients are able to view lab/test results, encounter notes, upcoming appointments, etc.  Non-urgent messages can be sent to your provider as well.   To learn more about what you can do with MyChart, go to [email or web address].    Your next appointment:   12 month(s)  The format for your next appointment:   In Person  Provider:   Eleonore Chiquito, MD

## 2021-11-30 ENCOUNTER — Other Ambulatory Visit: Payer: Self-pay | Admitting: Cardiovascular Disease

## 2021-11-30 ENCOUNTER — Other Ambulatory Visit: Payer: Self-pay | Admitting: Physician Assistant

## 2021-11-30 ENCOUNTER — Telehealth: Payer: Self-pay | Admitting: Student

## 2021-11-30 MED ORDER — APIXABAN 5 MG PO TABS: 5.0000 mg | ORAL_TABLET | Freq: Two times a day (BID) | ORAL | 3 refills | Status: DC

## 2021-11-30 NOTE — Telephone Encounter (Signed)
   Daughter called Answering Service with request for medication refill. Patient needs refill of her Eliquis. She was seen by Dr. Audie Box yesterday in the office and plan was to continue Eliquis '5mg'$  twice daily for her atrial fibrillation. Will send in refill to request Pharmacy. Daughter very appreciative of the help.  Darreld Mclean, PA-C 11/30/2021 4:54 PM

## 2021-12-02 NOTE — Telephone Encounter (Signed)
Prescription refill request for Eliquis received. Indication: AF Last office visit: 11/29/21  Dorann Lodge MD Scr: 0.99 on 10/07/21 Age: 76 Weight: 117.9kg  Based on above findings Eliquis '5mg'$  twice daily is the appropriate dose.  Refill approved.

## 2022-07-02 ENCOUNTER — Encounter: Payer: Self-pay | Admitting: Family Medicine

## 2022-07-02 ENCOUNTER — Ambulatory Visit (INDEPENDENT_AMBULATORY_CARE_PROVIDER_SITE_OTHER): Payer: Medicare HMO | Admitting: Family Medicine

## 2022-07-02 VITALS — BP 136/84 | HR 85 | Temp 98.7°F | Ht 63.0 in

## 2022-07-02 DIAGNOSIS — Z13820 Encounter for screening for osteoporosis: Secondary | ICD-10-CM

## 2022-07-02 DIAGNOSIS — I2510 Atherosclerotic heart disease of native coronary artery without angina pectoris: Secondary | ICD-10-CM | POA: Diagnosis not present

## 2022-07-02 DIAGNOSIS — Z1231 Encounter for screening mammogram for malignant neoplasm of breast: Secondary | ICD-10-CM

## 2022-07-02 DIAGNOSIS — N1831 Chronic kidney disease, stage 3a: Secondary | ICD-10-CM

## 2022-07-02 DIAGNOSIS — K429 Umbilical hernia without obstruction or gangrene: Secondary | ICD-10-CM | POA: Insufficient documentation

## 2022-07-02 DIAGNOSIS — D6869 Other thrombophilia: Secondary | ICD-10-CM | POA: Insufficient documentation

## 2022-07-02 DIAGNOSIS — I10 Essential (primary) hypertension: Secondary | ICD-10-CM

## 2022-07-02 DIAGNOSIS — M160 Bilateral primary osteoarthritis of hip: Secondary | ICD-10-CM

## 2022-07-02 DIAGNOSIS — E1142 Type 2 diabetes mellitus with diabetic polyneuropathy: Secondary | ICD-10-CM

## 2022-07-02 DIAGNOSIS — I480 Paroxysmal atrial fibrillation: Secondary | ICD-10-CM

## 2022-07-02 DIAGNOSIS — Z8673 Personal history of transient ischemic attack (TIA), and cerebral infarction without residual deficits: Secondary | ICD-10-CM | POA: Diagnosis not present

## 2022-07-02 DIAGNOSIS — E1122 Type 2 diabetes mellitus with diabetic chronic kidney disease: Secondary | ICD-10-CM | POA: Diagnosis not present

## 2022-07-02 DIAGNOSIS — Z780 Asymptomatic menopausal state: Secondary | ICD-10-CM

## 2022-07-02 DIAGNOSIS — E785 Hyperlipidemia, unspecified: Secondary | ICD-10-CM | POA: Diagnosis not present

## 2022-07-02 DIAGNOSIS — Z7984 Long term (current) use of oral hypoglycemic drugs: Secondary | ICD-10-CM

## 2022-07-02 MED ORDER — HYDROCODONE-ACETAMINOPHEN 10-325 MG PO TABS: 1.0000 | ORAL_TABLET | Freq: Three times a day (TID) | ORAL | 0 refills | Status: AC | PRN

## 2022-07-02 NOTE — Assessment & Plan Note (Signed)
Stable. Reviewed appropriate foot care. Continue duloxetine 60 mg daily and pregabalin 50 mg daily

## 2022-07-02 NOTE — Assessment & Plan Note (Signed)
I will reassess her renal function.

## 2022-07-02 NOTE — Assessment & Plan Note (Signed)
I will check annual DM labs today. Continue glipizide 5 mg daily. I do support her starting on Mounjaro 2.5 mg weekly.

## 2022-07-02 NOTE — Assessment & Plan Note (Signed)
Stable without angina. Continue metoprolol tartrate 50 mg bid and lipid management.

## 2022-07-02 NOTE — Assessment & Plan Note (Signed)
Stable. On apixiban for stroke prevention due to her a fib.

## 2022-07-02 NOTE — Assessment & Plan Note (Signed)
I will check her lipids today. Continue atorvastatin 20 mg daily.

## 2022-07-02 NOTE — Progress Notes (Signed)
[facility] [address] [place] [address] Dept: [phone] Dept Fax: [phone]  New Patient Office Visit  Subjective:    Patient ID: Jade Ellison, female    DOB: August 14, 1945, 77 y.o..   MRN: [id number]  Chief Complaint  Patient presents with   Establish Care    Would like to discuss labwork for A1c, cbc.RX refill on desonide. Has dm eye exam scheduled.    History of Present Illness:  Patient is in today to establish care. Ms. Jade Ellison was born in [place], [place]. He lived in [facility] until 7th grade, when the family moved to [place], [place]. She attended [employer] and became a Engineer, civil (consulting), working with a Careers adviser for many years. She was married, but has been a widow for 21 years. She ahs two children: son (15) and daughter Jade Ellison (71). She has severn grand children. Ms. Jade Ellison denies tobacco use and only drinks on rare occasions.  Ms. Jade Ellison has a history of atrial fibrillation. This was found at the time when she presented with a lacunar stroke last Fall. She is currently managed on metoprolol tartrate 25 mg bid and apixiban 5 mg bid.  Ms. Jade Ellison has a history of hyperlipidemia. She is managed on atorvastatin 20 mg daily.  Ms. Jade Ellison has a history of irritable bowel syndrome (IBS). She manages with PRN dicyclomine and takes a daily probiotic.  Ms. Jade Ellison has a history of anxiety. She manages this with duloxetine 60 mg daily. She rarely takes alprazolam 0.25 mg.  Ms. Jade Ellison has a history of Type 2 diabetes. She is currently managed on glipizide 5 mg daily. She recently was prescribed Mounjaro 2.5 mg weekly, but has not started using this yet. She has peripheral neuropathy and manages with duloxetine and pregabalin 50 mg daily.  Ms. Jade Ellison has a history of hypertension. She is managed on losartan-hydrochlorothiazide (Hyzaar) 100-12.5 mg daily.  Ms. Jade Ellison has a history of severe arthritis of the hips  and knees. She has been on tramadol for days when her pain is more severe. She has tried to be up and around more. She feels the tramadol is not helpful for her.  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) 10/06/2021   Paroxysmal atrial fibrillation (HCC) 10/06/2021   Polyp of transverse colon    Polyp of ascending colon    Recurrent UTI 01/16/2019   Short-term memory loss 10/06/2018   GAD (generalized anxiety disorder) 09/20/2017   Irritable bowel syndrome with both constipation and diarrhea 09/20/2017   Chronic kidney disease, stage 3a (HCC) 10/13/2016   Diabetic peripheral neuropathy associated with type 2 diabetes mellitus (HCC) 11/17/2014   Barrett's esophagus without dysplasia 06/26/2014   Dyslipidemia 05/24/2013   SVT (supraventricular tachycardia) 09/19/2011   Aphagia 09/19/2011   Osteopenia of the elderly 07/29/2011   History of lower GI bleeding 04/10/2011   Iron deficiency anemia 04/10/2011   Class 3 severe obesity due to excess calories with serious comorbidity and body mass index (BMI) of 50.0 to 59.9 in adult (HCC) 04/10/2011   Mixed incontinence 04/07/2007   Essential hypertension 03/25/2007   DIVERTICULOSIS, COLON 03/25/2007   OSA (obstructive sleep apnea) 03/25/2007   NEPHROLITHIASIS, HX OF 03/25/2007  Hemorrhoids 03/25/2007   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/2007   Primary osteoarthritis of both knees 12/15/2006   CAD (coronary artery disease) 12/15/2006   Past Surgical History:  Procedure Laterality Date   BIOPSY  11/07/2019   Procedure: BIOPSY;  Surgeon: Napoleon Form, MD;  Location: [facility];  Service: Endoscopy;;   CARDIAC CATHETERIZATION  2003    normal  coronary arteries and nomal LV function by cath performed by Dr.Peter Nishan   CESAREAN SECTION  1986   CHOLECYSTECTOMY     COLONOSCOPY WITH PROPOFOL N/A 11/07/2019   Procedure: COLONOSCOPY WITH PROPOFOL;  Surgeon: Napoleon Form, MD;  Location: [facility];  Service: Endoscopy;  Laterality: N/A;   ENTEROSCOPY  02/05/2012   Procedure: ENTEROSCOPY;  Surgeon: Hart Carwin, MD;  Location: [facility];  Service: Endoscopy;  Laterality: N/A;   ESOPHAGOGASTRODUODENOSCOPY N/A 04/19/2014   Procedure: ESOPHAGOGASTRODUODENOSCOPY (EGD);  Surgeon: Hart Carwin, MD;  Location: [facility];  Service: Endoscopy;  Laterality: N/A;   ESOPHAGOGASTRODUODENOSCOPY (EGD) WITH PROPOFOL N/A 08/01/2015   Procedure: ESOPHAGOGASTRODUODENOSCOPY (EGD) WITH PROPOFOL;  Surgeon: Napoleon Form, MD;  Location: [facility];  Service: Endoscopy;  Laterality: N/A;   ESOPHAGOGASTRODUODENOSCOPY (EGD) WITH PROPOFOL N/A 11/07/2019   Procedure: ESOPHAGOGASTRODUODENOSCOPY (EGD) WITH PROPOFOL;  Surgeon: Napoleon Form, MD;  Location: [facility];  Service: Endoscopy;  Laterality: N/A;   HERNIA REPAIR     HOT HEMOSTASIS  02/05/2012   Procedure: HOT HEMOSTASIS (ARGON PLASMA COAGULATION/BICAP);  Surgeon: Hart Carwin, MD;  Location: [facility];  Service: Endoscopy;  Laterality: N/A;   KNEE ARTHROSCOPY Left    left sided lithotripsy     POLYPECTOMY  11/07/2019   Procedure: POLYPECTOMY;  Surgeon: Napoleon Form, MD;  Location: [facility];  Service: Endoscopy;;   TUBAL LIGATION     Family History  Problem Relation Age of Onset   Heart disease Mother    Heart disease Father    Diabetes Brother    Diabetes Sister    Kidney cancer Brother    Colon cancer Neg Hx    Outpatient Medications Prior to Visit  Medication Sig Dispense Refill   acetaminophen (TYLENOL) 325 MG tablet Take 650 mg by mouth every 6 (six) hours as needed for moderate pain or headache.     ALPRAZolam (XANAX) 0.25 MG tablet Take 0.25 mg by  mouth 2 (two) times daily as needed for anxiety.      apixaban (ELIQUIS) 5 MG TABS tablet TAKE 1 TABLET(5 MG) BY MOUTH TWICE DAILY 60 tablet 5   atorvastatin (LIPITOR) 20 MG tablet Take 20 mg by mouth daily.     Cyanocobalamin (B-12 PO) Take 1 capsule by mouth daily.     desonide (DESOWEN) 0.05 % cream Apply 1 application topically 2 (two) times daily as needed (dry skin/irritation).      dicyclomine (BENTYL) 20 MG tablet TAKE 1 TABLET(20 MG) BY MOUTH THREE TIMES DAILY AFTER MEALS (Patient taking differently: Take 20 mg by mouth 2 (two) times daily.) 90 tablet 0   DULoxetine (CYMBALTA) 60 MG capsule Take 60 mg by mouth daily.     glipiZIDE (GLUCOTROL XL) 5 MG 24 hr tablet Take 5 mg by mouth daily.     latanoprost (XALATAN) 0.005 % ophthalmic solution Place 1 drop into both eyes at bedtime.      losartan-hydrochlorothiazide (HYZAAR) 100-12.5 MG tablet Take 1 tablet by mouth daily.  11   metoprolol tartrate (LOPRESSOR) 50 MG tablet Take 50 mg by mouth  2 (two) times daily.     Mouthwashes (BIOTENE/CALCIUM MT) Take 1 Dose by mouth daily as needed (dry mouth).      Multiple Vitamin (MULTIVITAMIN WITH MINERALS) TABS tablet Take 1 tablet by mouth daily.     omeprazole (PRILOSEC) 40 MG capsule TAKE 1 CAPSULE(40 MG) BY MOUTH DAILY (Patient taking differently: Take 40 mg by mouth daily.) 90 capsule 0   pregabalin (LYRICA) 50 MG capsule Take 50 mg by mouth 2 (two) times daily.     psyllium (METAMUCIL SMOOTH TEXTURE) 28 % packet Take 1 packet by mouth daily as needed (constipation).      saccharomyces boulardii (FLORASTOR) 250 MG capsule Take 1 capsule (250 mg total) by mouth daily. 30 capsule 0   Vitamin D, Ergocalciferol, (DRISDOL) 1.25 MG (50000 UNIT) CAPS capsule Take 50,000 Units by mouth every Wednesday.     apixaban (ELIQUIS) 5 MG TABS tablet Take 1 tablet (5 mg total) by mouth 2 (two) times daily. (Patient not taking: Reported on 07/02/2022) 180 tablet 3   ferrous sulfate 325 (65 FE) MG tablet Take 325  mg by mouth daily. (Patient not taking: Reported on 07/02/2022)     RYBELSUS 3 MG TABS Take 1 tablet by mouth daily. (Patient not taking: Reported on 07/02/2022)     No facility-administered medications prior to visit.   Allergies  Allergen Reactions   Neurontin [Gabapentin] Other (See Comments)    Dizziness   Codeine Rash   Fosamax [Alendronate Sodium] Other (See Comments)    Upset stomach    Nsaids Other (See Comments)    Abdominal Pain   Objective:   Today's Vitals   07/02/22 1452  BP: 136/84  Pulse: 85  Temp: 98.7 F (37.1 C)  TempSrc: Temporal  SpO2: 98%  Height: 5\' 3"  (1.6 m)   Body mass index is 46.06 kg/m.   General: Well developed, well nourished. No acute distress. Feet- Skin generally dry and flaky. No sign of maceration between toes. Nails are mildly thick and   disfigured. Dorsalis pedis and posterior tibial artery pulses are normal. 5.07 monofilament testing  normal except around the ball of both feet. Mild claw toe deformity on the right. Psych: Alert and oriented. Normal mood and affect.  Health Maintenance Due  Topic Date Due   OPHTHALMOLOGY EXAM  Never done   Diabetic kidney evaluation - Urine ACR  Never done   Hepatitis C Screening  Never done   DTaP/Tdap/Td (1 - Tdap) Never done   Zoster Vaccines- Shingrix (1 of 2) Never done   DEXA SCAN  Never done   Pneumonia Vaccine 84+ Years old (2 of 2 - PCV) 09/14/2010   Medicare Annual Wellness (AWV)  05/15/2021   HEMOGLOBIN A1C  04/07/2022     Assessment & Plan:   Problem List Items Addressed This Visit       Cardiovascular and Mediastinum   Essential hypertension    Blood pressure is in adequate control. Continue osartan-hydrochlorothiazide (Hyzaar) 100-12.5 mg daily.       Paroxysmal atrial fibrillation (HCC)    Rate well controlled. Continue metoprolol tartrate 25 mg bid and apixiban 5 mg bid.      CAD (coronary artery disease)    Stable without angina. Continue metoprolol tartrate 50 mg bid  and lipid management.        Endocrine   Diabetic peripheral neuropathy associated with type 2 diabetes mellitus (HCC)    Stable. Reviewed appropriate foot care. 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 Continue glipizide 5 mg daily. I do support her starting on Mounjaro 2.5 mg weekly.      Relevant Orders   Microalbumin / creatinine urine ratio   Basic metabolic panel   Hemoglobin A1c     Musculoskeletal and Integument   Primary osteoarthritis of both hips    We discussed prescribing a rescue dose of Vicodin for use 1-2 times a week for more severe pain. She will continue to follow with orthopedics.      Relevant Medications   HYDROcodone-acetaminophen (NORCO) 10-325 MG tablet     Genitourinary   Chronic kidney disease, stage 3a (HCC)    I will reassess her renal function.      Relevant Orders   Microalbumin / creatinine urine ratio   Urinalysis, Routine w reflex microscopic   CBC   Phosphorus   Parathyroid hormone, intact (no Ca)   VITAMIN D 25 Hydroxy (Vit-D Deficiency, Fractures)     Other   Dyslipidemia    I will check her lipids today. Continue atorvastatin 20 mg daily.      Relevant Orders   Lipid panel   History of stroke - Primary    Stable. On apixiban for stroke prevention due to her a fib.      Other Visit Diagnoses     Encounter for screening mammogram for malignant neoplasm of breast       Relevant Orders   MM DIGITAL SCREENING BILATERAL   Postmenopausal       Relevant Orders   DG Bone Density   Screening for osteoporosis       Relevant Orders   DG Bone Density       Return in about 3 months (around 10/02/2022) for Reassessment.   Loyola Mast, MD

## 2022-07-02 NOTE — Assessment & Plan Note (Signed)
We discussed prescribing a rescue dose of Vicodin for use 1-2 times a week for more severe pain. She will continue to follow with orthopedics.

## 2022-07-02 NOTE — Assessment & Plan Note (Signed)
Blood pressure is in adequate control. Continue osartan-hydrochlorothiazide (Hyzaar) 100-12.5 mg daily.

## 2022-07-02 NOTE — Assessment & Plan Note (Signed)
Rate well controlled. Continue metoprolol tartrate 25 mg bid and apixiban 5 mg bid.

## 2022-07-03 ENCOUNTER — Telehealth: Payer: Self-pay | Admitting: Family Medicine

## 2022-07-03 ENCOUNTER — Encounter: Payer: Self-pay | Admitting: Family Medicine

## 2022-07-03 LAB — BASIC METABOLIC PANEL
BUN: 23 mg/dL (ref 6–23)
CO2: 25 mEq/L (ref 19–32)
Calcium: 9.5 mg/dL (ref 8.4–10.5)
Chloride: 97 mEq/L (ref 96–112)
Creatinine, Ser: 1.07 mg/dL (ref 0.40–1.20)
GFR: 50.14 mL/min — ABNORMAL LOW (ref >60.00)
Glucose, Bld: 133 mg/dL — ABNORMAL HIGH (ref 70–99)
Potassium: 4.4 mEq/L (ref 3.5–5.1)
Sodium: 136 mEq/L (ref 135–145)

## 2022-07-03 LAB — PHOSPHORUS: Phosphorus: 3.3 mg/dL (ref 2.3–4.6)

## 2022-07-03 LAB — CBC
HCT: 35.1 % — ABNORMAL LOW (ref 36.0–46.0)
Hemoglobin: 10.9 g/dL — ABNORMAL LOW (ref 12.0–15.0)
MCHC: 31.1 g/dL (ref 30.0–36.0)
MCV: 78.8 fl (ref 78.0–100.0)
Platelets: 221 10*3/uL (ref 150.0–400.0)
RBC: 4.45 Mil/uL (ref 3.87–5.11)
RDW: 16.6 % — ABNORMAL HIGH (ref 11.5–15.5)
WBC: 6.8 10*3/uL (ref 4.0–10.5)

## 2022-07-03 LAB — LIPID PANEL
Cholesterol: 103 mg/dL (ref 0–200)
HDL: 45.2 mg/dL (ref >39.00)
LDL Cholesterol: 39 mg/dL (ref 0–99)
NonHDL: 57.59
Total CHOL/HDL Ratio: 2
Triglycerides: 92 mg/dL (ref 0.0–149.0)
VLDL: 18.4 mg/dL (ref 0.0–40.0)

## 2022-07-03 LAB — HEMOGLOBIN A1C: Hgb A1c MFr Bld: 7.4 % — ABNORMAL HIGH (ref 4.6–6.5)

## 2022-07-03 LAB — VITAMIN D 25 HYDROXY (VIT D DEFICIENCY, FRACTURES): VITD: 43.85 ng/mL (ref 30.00–100.00)

## 2022-07-03 LAB — PARATHYROID HORMONE, INTACT (NO CA): PTH: 73 pg/mL (ref 16–77)

## 2022-07-03 LAB — EXTRA LAV TOP TUBE

## 2022-07-03 NOTE — Telephone Encounter (Signed)
Attempted to contact the patients daughter, no answer, left a detailed message. I stated that their was not an extra lavender tube without a test attached to it. I explain on the daughter's voicemail that it was the actual tube that the blood was drawn in that they are referring to. I advised the patients daughter to call me back if she had any more questions.

## 2022-07-03 NOTE — Telephone Encounter (Signed)
Pt's daughter, Tresa Endo called in stating she missed three phone calls after her mom's appointment yesterday 07/02/22. She was talking about a lavender tube with no orders attached. Please advise Tresa Endo at   [phone] [facility])

## 2022-07-04 ENCOUNTER — Other Ambulatory Visit: Payer: Self-pay | Admitting: Cardiovascular Disease

## 2022-07-04 DIAGNOSIS — I480 Paroxysmal atrial fibrillation: Secondary | ICD-10-CM

## 2022-07-04 NOTE — Telephone Encounter (Signed)
Eliquis 5mg  refill request received. Patient is 77 years old, weight-117.9kg, Crea-1.07 on 07/02/22, Diagnosis-Afib, and last seen by Dr. Flora Lipps on 11/29/21. Dose is appropriate based on dosing criteria. Will send in refill to requested pharmacy.

## 2022-07-09 LAB — HM MAMMOGRAPHY

## 2022-07-29 ENCOUNTER — Encounter: Payer: Self-pay | Admitting: Internal Medicine

## 2022-07-29 ENCOUNTER — Telehealth: Payer: Medicare HMO | Admitting: Internal Medicine

## 2022-07-29 DIAGNOSIS — R058 Other specified cough: Secondary | ICD-10-CM

## 2022-07-29 DIAGNOSIS — Z20822 Contact with and (suspected) exposure to covid-19: Secondary | ICD-10-CM | POA: Diagnosis not present

## 2022-07-29 MED ORDER — BENZONATATE 100 MG PO CAPS: 100.0000 mg | ORAL_CAPSULE | Freq: Three times a day (TID) | ORAL | 0 refills | Status: DC | PRN

## 2022-07-29 MED ORDER — AZITHROMYCIN 250 MG PO TABS: ORAL_TABLET | ORAL | 0 refills | Status: AC

## 2022-07-29 NOTE — Progress Notes (Signed)
[facility] [address] [place] [address] Dept: [phone] Dept Fax: [phone]  Virtual Video Visit  I connected with Jade Ellison on 07/29/22 at 10:40 AM EDT by a video enabled telemedicine application and verified that I am speaking with the correct person using two identifiers.   Location patient: Home Location provider: Clinic Persons participating in the virtual visit: Patient; Mary Sella CMA; Salvatore Decent, FNP-C  I discussed the limitations of evaluation and management by telemedicine and the availability of in-person appointments. The patient expressed understanding and agreed to proceed.  Chief Complaint  Patient presents with   Sore Throat    Fatigue, chest congestion, coughing     SUBJECTIVE:  HPI:  Jade Ellison presents complaining of URI symptoms. States she was recently in long car ride with daughter over the weekend, who recently just tested positive for COVID. Patient is having same symptoms as daughter.  She has not done a home COVID test yet, but she will this afternoon. Onset: 1 day ago Associated symptoms: sore throat, chest congestion, cough Denies: fever, chills, CP, SHOB Treatments tried: [employer] with some relief of cough  The following portions of the patient's history were reviewed and updated as appropriate: medical history, surgical history, medications, allergies, social history, and family history.    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 pylori)    Hemorrhoids    Hernia of unspecified site of abdominal cavity without mention of obstruction or gangrene    History of blood transfusion    History of kidney stones    Hypertension 09 07 2013   TRANSTHORACIC ECHO STUDY CONCLUSIONS    Hypertension 09 07 2013   EJECTION FRACTION- 55%-60% .WALL MOTION WAS NORMAL   Impaired mobility    Iron deficiency anemia    Morbid obesity (HCC)    Neuropathy    Obesity    Olecranon bursitis of left elbow    OSA (obstructive sleep apnea)    On cpap   Osteoarthritis    right hip   Osteoporosis    Primary osteoarthritis    Bilaterally (knee)   PSVT (paroxysmal supraventricular tachycardia)    PSVT (paroxysmal supraventricular tachycardia)    Short-term memory loss    Shortness of breath dyspnea    Sleep apnea    on CPAP- does not use machine   Spinal headache 1986   Type 2 diabetes mellitus (HCC)    Past Surgical History:  Procedure Laterality Date   BIOPSY  11/07/2019   Procedure: BIOPSY;  Surgeon: Napoleon Form, MD;  Location: [facility];  Service: Endoscopy;;   CARDIAC CATHETERIZATION  01/13/2001   normal coronary arteries and nomal LV function by cath performed by Dr.Peter Nishan   CESAREAN SECTION  01/14/1984   CHOLECYSTECTOMY     COLONOSCOPY WITH PROPOFOL N/A  11/07/2019   Procedure: COLONOSCOPY WITH PROPOFOL;  Surgeon: Napoleon Form, MD;  Location: [facility];  Service: Endoscopy;  Laterality: N/A;   ENTEROSCOPY  02/05/2012   Procedure: ENTEROSCOPY;  Surgeon: Hart Carwin, MD;  Location: [facility];  Service: Endoscopy;  Laterality: N/A;   ESOPHAGOGASTRODUODENOSCOPY N/A 04/19/2014   Procedure: ESOPHAGOGASTRODUODENOSCOPY (EGD);  Surgeon: Hart Carwin, MD;  Location: [facility];  Service: Endoscopy;  Laterality: N/A;   ESOPHAGOGASTRODUODENOSCOPY (EGD) WITH PROPOFOL N/A 08/01/2015   Procedure: ESOPHAGOGASTRODUODENOSCOPY (EGD) WITH  PROPOFOL;  Surgeon: Napoleon Form, MD;  Location: [facility];  Service: Endoscopy;  Laterality: N/A;   ESOPHAGOGASTRODUODENOSCOPY (EGD) WITH PROPOFOL N/A 11/07/2019   Procedure: ESOPHAGOGASTRODUODENOSCOPY (EGD) WITH PROPOFOL;  Surgeon: Napoleon Form, MD;  Location: [facility];  Service: Endoscopy;  Laterality: N/A;   EXTRACORPOREAL SHOCK WAVE LITHOTRIPSY Left    HERNIA REPAIR     HOT HEMOSTASIS  02/05/2012   Procedure: HOT HEMOSTASIS (ARGON PLASMA COAGULATION/BICAP);  Surgeon: Hart Carwin, MD;  Location: [facility];  Service: Endoscopy;  Laterality: N/A;   KNEE ARTHROSCOPY Left    POLYPECTOMY  11/07/2019   Procedure: POLYPECTOMY;  Surgeon: Napoleon Form, MD;  Location: [facility];  Service: Endoscopy;;   TUBAL LIGATION     UMBILICAL HERNIA REPAIR       Current Outpatient Medications:    ALPRAZolam (XANAX) 0.25 MG tablet, Take 0.25 mg by mouth 2 (two) times daily as needed for anxiety. , Disp: , Rfl:    atorvastatin (LIPITOR) 20 MG tablet, Take 20 mg by mouth daily., Disp: , Rfl:    azithromycin (ZITHROMAX) 250 MG tablet, Take 2 tablets on day 1, then 1 tablet daily on days 2 through 5, Disp: 6 tablet, Rfl: 0   benzonatate (TESSALON) 100 MG capsule, Take 1 capsule (100 mg total) by mouth 3 (three) times daily as needed for cough., Disp: 30 capsule, Rfl: 0   dicyclomine (BENTYL) 20 MG tablet, TAKE 1 TABLET(20 MG) BY MOUTH THREE TIMES DAILY AFTER MEALS (Patient taking differently: Take 20 mg by mouth 2 (two) times daily.), Disp: 90 tablet, Rfl: 0   DULoxetine (CYMBALTA) 60 MG capsule, Take 60 mg by mouth daily., Disp: , Rfl:    ELIQUIS 5 MG TABS tablet, TAKE 1 TABLET(5 MG) BY MOUTH TWICE DAILY, Disp: 60 tablet, Rfl: 5   glipiZIDE (GLUCOTROL XL) 5 MG 24 hr tablet, Take 5 mg by mouth daily., Disp: , Rfl:    latanoprost (XALATAN) 0.005 % ophthalmic solution, Place 1 drop into both eyes at bedtime. , Disp: , Rfl:    losartan-hydrochlorothiazide (HYZAAR) 100-12.5 MG  tablet, Take 1 tablet by mouth daily., Disp: , Rfl: 11   metoprolol tartrate (LOPRESSOR) 50 MG tablet, Take 50 mg by mouth 2 (two) times daily., Disp: , Rfl:    Multiple Vitamin (MULTIVITAMIN WITH MINERALS) TABS tablet, Take 1 tablet by mouth daily., Disp: , Rfl:    omeprazole (PRILOSEC) 40 MG capsule, TAKE 1 CAPSULE(40 MG) BY MOUTH DAILY (Patient taking differently: Take 40 mg by mouth daily.), Disp: 90 capsule, Rfl: 0   pregabalin (LYRICA) 50 MG capsule, Take 50 mg by mouth 2 (two) times daily., Disp: , Rfl:    psyllium (METAMUCIL SMOOTH TEXTURE) 28 % packet, Take 1 packet by mouth daily as needed (constipation). , Disp: , Rfl:    saccharomyces boulardii (FLORASTOR) 250 MG capsule, Take 1 capsule (250 mg total) by mouth daily., Disp: 30 capsule, Rfl: 0   acetaminophen (TYLENOL) 325 MG tablet, Take 650  mg by mouth every 6 (six) hours as needed for moderate pain or headache. (Patient not taking: Reported on 07/29/2022), Disp: , Rfl:    Cyanocobalamin (B-12 PO), Take 1 capsule by mouth daily. (Patient not taking: Reported on 07/29/2022), Disp: , Rfl:    desonide (DESOWEN) 0.05 % cream, Apply 1 application topically 2 (two) times daily as needed (dry skin/irritation). , Disp: , Rfl:    ferrous sulfate 325 (65 FE) MG tablet, Take 325 mg by mouth daily. (Patient not taking: Reported on 07/02/2022), Disp: , Rfl:    Mouthwashes (BIOTENE/CALCIUM MT), Take 1 Dose by mouth daily as needed (dry mouth). , Disp: , Rfl:  Allergies  Allergen Reactions   Neurontin [Gabapentin] Other (See Comments)    Dizziness   Codeine Rash   Fosamax [Alendronate Sodium] Other (See Comments)    Upset stomach    Nsaids Other (See Comments)    Abdominal Pain    Social History   Socioeconomic History   Marital status: Widowed    Spouse name: Not on file   Number of children: 2   Years of education: Not on file   Highest education level: Some college, no degree  Occupational History   Occupation: RETIRED    Employer:  RETIRED  Tobacco Use   Smoking status: Never    Passive exposure: Never   Smokeless tobacco: Never  Vaping Use   Vaping status: Never Used  Substance and Sexual Activity   Alcohol use: Yes    Alcohol/week: 1.0 standard drink of alcohol    Types: 1 Glasses of wine per week    Comment: wine sometimes on holidays and bedtime- rarely   Drug use: No   Sexual activity: Not on file  Other Topics Concern   Not on file  Social History Narrative   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   Social Determinants of Health   Financial Resource Strain: Low Risk  (03/24/2022)   Received from [facility], [facility]   Overall Financial Resource Strain (CARDIA)    Difficulty of Paying Living Expenses: Not hard at all  Food Insecurity: No Food Insecurity (03/24/2022)   Received from [facility], [facility]   Hunger Vital Sign    Worried About Running Out of Food in the Last Year: Never true    Ran Out of Food in the Last Year: Never true  Transportation Needs: No Transportation Needs (03/24/2022)   Received from [facility], [facility]   PRAPARE - Transportation    Lack of Transportation (Medical): No    Lack of Transportation (Non-Medical): No  Physical Activity: Unknown (03/12/2021)   Received from [facility], [facility]   Exercise Vital Sign    Days of Exercise per Week: Patient declined    Minutes of Exercise per Session: 10 min  Stress: Stress Concern Present (03/12/2021)   Received from [facility], [facility] - Occupational Stress Questionnaire    Feeling of Stress : To some extent  Social Connections: Unknown (05/13/2021)   Received from [facility], [facility]   Social Network    Social Network: Not on file  Intimate Partner Violence: Unknown (04/15/2021)   Received from [facility], [facility]   HITS    Physically Hurt: Not on file    Insult or Talk Down To: Not on file    Threaten Physical Harm:  Not on file    Scream or Curse: Not on file  Family History  Problem Relation Age of Onset   Heart disease Mother    Heart disease Father    Diabetes Sister    Stroke Brother    Hypertension Brother    Diabetes Brother    Cancer Brother        Renal   Cancer Brother        Prostate   Heart disease Brother    Heart disease Brother    Cancer Brother        Prostate, bladder   Diabetes Paternal Grandmother    Colon cancer Neg Hx      ROS: A complete ROS was performed with pertinent positives/negatives noted in the HPI. The remainder of the ROS are negative.    OBJECTIVE:  VITALS per patient if applicable: There were no vitals filed for this visit. There is no height or weight on file to calculate BMI.   GENERAL: Alert and oriented. Appears well and in no acute distress. HEENT: Atraumatic. Conjunctiva clear. No obvious abnormalities on inspection of external nose and ears. NECK: Normal movements of the head and neck. LUNGS: On inspection, no signs of respiratory distress. Breathing rate appears normal. No obvious gross SOB, gasping or wheezing, and no conversational dyspnea. CV: No obvious cyanosis. PSYCH/NEURO: Pleasant and cooperative. No obvious depression or anxiety. Speech and thought processing grossly intact.  ASSESSMENT AND PLAN: 1. Cough with exposure to COVID-19 virus - azithromycin (ZITHROMAX) 250 MG tablet; Take 2 tablets on day 1, then 1 tablet daily on days 2 through 5  Dispense: 6 tablet; Refill: 0 - benzonatate (TESSALON) 100 MG capsule; Take 1 capsule (100 mg total) by mouth 3 (three) times daily as needed for cough.  Dispense: 30 capsule; Refill: 0 - patient will do home COVID test and notify provider of results. I highly suspected COVID due to known close prolonged contact with a family member who has tested positive for COVID.  Patient not a candidate for paxlovid prescription due to significant medication interaction of Eliquis for Afib.   Vitamin  Regimen:  Vitamin C 500mg  twice daily  Vitamin D 5000 units once daily  Zinc 50-75mg  once daily   Over the counter Medications:  Use Tylenol (acetaminophen) for fever *unless allergic or contraindicated*    Non-Medication Therapy:  Drink plenty of fluids, warm if possible.  A teaspoon of honey may help ease coughing symptoms.  Cough drops or hard candy for coughing.   Over the Counter Medication Therapy:  Use a cough expectorant such as guaifenesin (Mucinex) if recommended by your doctor for a wet, congested cough. If you have high blood pressure, please ask your doctor first before using this.  Use a cough suppressant such as dextromethorphan (Robitussin/Delsym) for a dry cough. If you have high blood pressure, please ask your doctor first before using this.  If you have high blood pressure, medication such as Coricidin HBP is safe to take for your cough and will not increase your blood pressure.    Stay home and away from others until symptoms are improving and fever free for 24 hours without the use of tylenol or ibuprofen. If symptoms have improved and fever resolved, then can be in public setting, but should wear a mask around others, practice safe distancing, and perform hand hygiene for an additional 5 days.  If symptoms worsen, or the patient develop shortness of breath, pulse oximeter reading of < 90%, or chest pain, seek immediate care at nearest emergency room or call 911.  I discussed the assessment and treatment plan with the patient. The patient was provided an opportunity to ask questions and all were answered. The patient agreed with the plan and demonstrated an understanding of the instructions.   The patient was advised to call back or seek an in-person evaluation if the symptoms worsen or if the condition fails to improve as anticipated.  Return if symptoms worsen or fail to improve.  Salvatore Decent, FNP

## 2022-08-14 ENCOUNTER — Telehealth: Payer: Self-pay | Admitting: Family Medicine

## 2022-08-14 ENCOUNTER — Encounter: Payer: Self-pay | Admitting: Family Medicine

## 2022-08-14 NOTE — Telephone Encounter (Signed)
error 

## 2022-08-20 ENCOUNTER — Other Ambulatory Visit: Payer: Self-pay | Admitting: Family Medicine

## 2022-08-22 ENCOUNTER — Ambulatory Visit (INDEPENDENT_AMBULATORY_CARE_PROVIDER_SITE_OTHER): Payer: Medicare HMO

## 2022-08-22 DIAGNOSIS — Z0000 Encounter for general adult medical examination without abnormal findings: Secondary | ICD-10-CM | POA: Diagnosis not present

## 2022-08-22 NOTE — Patient Instructions (Signed)
Jade Ellison , Thank you for taking time to come for your Medicare Wellness Visit. I appreciate your ongoing commitment to your health goals. Please review the following plan we discussed and let me know if I can assist you in the future.   Referrals/Orders/Follow-Ups/Clinician Recommendations: none  This is a list of the screening recommended for you and due dates:  Health Maintenance  Topic Date Due   Eye exam for diabetics  Never done   Yearly kidney health urinalysis for diabetes  Never done   Hepatitis C Screening  Never done   DTaP/Tdap/Td vaccine (1 - Tdap) Never done   Zoster (Shingles) Vaccine (1 of 2) Never done   Pneumonia Vaccine (2 of 2 - PCV) 09/14/2010   COVID-19 Vaccine (7 - 2023-24 season) 05/21/2022   Flu Shot  08/14/2022   Hemoglobin A1C  01/01/2023   Yearly kidney function blood test for diabetes  07/02/2023   Complete foot exam   07/02/2023   Medicare Annual Wellness Visit  08/22/2023   DEXA scan (bone density measurement)  Completed   HPV Vaccine  Aged Out   Colon Cancer Screening  Discontinued    Advanced directives: (ACP Link)Information on Advanced Care Planning can be found at [facility] Advance Health Care Directives Advance Health Care Directives ([email or web address]   Next Medicare Annual Wellness Visit scheduled for next year: Yes  Preventive Care 65 Years and Older, Female Preventive care refers to lifestyle choices and visits with your health care provider that can promote health and wellness. What does preventive care include? A yearly physical exam. This is also called an annual well check. Dental exams once or twice a year. Routine eye exams. Ask your health care provider how often you should have your eyes checked. Personal lifestyle choices, including: Daily care of your teeth and gums. Regular physical activity. Eating a healthy diet. Avoiding tobacco and drug use. Limiting alcohol use. Practicing safe sex. Taking low-dose  aspirin every day. Taking vitamin and mineral supplements as recommended by your health care provider. What happens during an annual well check? The services and screenings done by your health care provider during your annual well check will depend on your age, overall health, lifestyle risk factors, and family history of disease. Counseling  Your health care provider may ask you questions about your: Alcohol use. Tobacco use. Drug use. Emotional well-being. Home and relationship well-being. Sexual activity. Eating habits. History of falls. Memory and ability to understand (cognition). Work and work Astronomer. Reproductive health. Screening  You may have the following tests or measurements: Height, weight, and BMI. Blood pressure. Lipid and cholesterol levels. These may be checked every 5 years, or more frequently if you are over 74 years old. Skin check. Lung cancer screening. You may have this screening every year starting at age 11 if you have a 30-pack-year history of smoking and currently smoke or have quit within the past 15 years. Fecal occult blood test (FOBT) of the stool. You may have this test every year starting at age 1. Flexible sigmoidoscopy or colonoscopy. You may have a sigmoidoscopy every 5 years or a colonoscopy every 10 years starting at age 48. Hepatitis C blood test. Hepatitis B blood test. Sexually transmitted disease (STD) testing. Diabetes screening. This is done by checking your blood sugar (glucose) after you have not eaten for a while (fasting). You may have this done every 1-3 years. Bone density scan. This is done to screen for osteoporosis. You may have this  done starting at age 4. Mammogram. This may be done every 1-2 years. Talk to your health care provider about how often you should have regular mammograms. Talk with your health care provider about your test results, treatment options, and if necessary, the need for more tests. Vaccines  Your  health care provider may recommend certain vaccines, such as: Influenza vaccine. This is recommended every year. Tetanus, diphtheria, and acellular pertussis (Tdap, Td) vaccine. You may need a Td booster every 10 years. Zoster vaccine. You may need this after age 82. Pneumococcal 13-valent conjugate (PCV13) vaccine. One dose is recommended after age 98. Pneumococcal polysaccharide (PPSV23) vaccine. One dose is recommended after age 39. Talk to your health care provider about which screenings and vaccines you need and how often you need them. This information is not intended to replace advice given to you by your health care provider. Make sure you discuss any questions you have with your health care provider. Document Released: 01/26/2015 Document Revised: 09/19/2015 Document Reviewed: 10/31/2014 Elsevier Interactive Patient Education  2017 [employer].  Fall Prevention in the Home Falls can cause injuries. They can happen to people of all ages. There are many things you can do to make your home safe and to help prevent falls. What can I do on the outside of my home? Regularly fix the edges of walkways and driveways and fix any cracks. Remove anything that might make you trip as you walk through a door, such as a raised step or threshold. Trim any bushes or trees on the path to your home. Use bright outdoor lighting. Clear any walking paths of anything that might make someone trip, such as rocks or tools. Regularly check to see if handrails are loose or broken. Make sure that both sides of any steps have handrails. Any raised decks and porches should have guardrails on the edges. Have any leaves, snow, or ice cleared regularly. Use sand or salt on walking paths during winter. Clean up any spills in your garage right away. This includes oil or grease spills. What can I do in the bathroom? Use night lights. Install grab bars by the toilet and in the tub and shower. Do not use towel bars as  grab bars. Use non-skid mats or decals in the tub or shower. If you need to sit down in the shower, use a plastic, non-slip stool. Keep the floor dry. Clean up any water that spills on the floor as soon as it happens. Remove soap buildup in the tub or shower regularly. Attach bath mats securely with double-sided non-slip rug tape. Do not have throw rugs and other things on the floor that can make you trip. What can I do in the bedroom? Use night lights. Make sure that you have a light by your bed that is easy to reach. Do not use any sheets or blankets that are too big for your bed. They should not hang down onto the floor. Have a firm chair that has side arms. You can use this for support while you get dressed. Do not have throw rugs and other things on the floor that can make you trip. What can I do in the kitchen? Clean up any spills right away. Avoid walking on wet floors. Keep items that you use a lot in easy-to-reach places. If you need to reach something above you, use a strong step stool that has a grab bar. Keep electrical cords out of the way. Do not use floor polish or wax  that makes floors slippery. If you must use wax, use non-skid floor wax. Do not have throw rugs and other things on the floor that can make you trip. What can I do with my stairs? Do not leave any items on the stairs. Make sure that there are handrails on both sides of the stairs and use them. Fix handrails that are broken or loose. Make sure that handrails are as long as the stairways. Check any carpeting to make sure that it is firmly attached to the stairs. Fix any carpet that is loose or worn. Avoid having throw rugs at the top or bottom of the stairs. If you do have throw rugs, attach them to the floor with carpet tape. Make sure that you have a light switch at the top of the stairs and the bottom of the stairs. If you do not have them, ask someone to add them for you. What else can I do to help prevent  falls? Wear shoes that: Do not have high heels. Have rubber bottoms. Are comfortable and fit you well. Are closed at the toe. Do not wear sandals. If you use a stepladder: Make sure that it is fully opened. Do not climb a closed stepladder. Make sure that both sides of the stepladder are locked into place. Ask someone to hold it for you, if possible. Clearly mark and make sure that you can see: Any grab bars or handrails. First and last steps. Where the edge of each step is. Use tools that help you move around (mobility aids) if they are needed. These include: Canes. Walkers. Scooters. Crutches. Turn on the lights when you go into a dark area. Replace any light bulbs as soon as they burn out. Set up your furniture so you have a clear path. Avoid moving your furniture around. If any of your floors are uneven, fix them. If there are any pets around you, be aware of where they are. Review your medicines with your doctor. Some medicines can make you feel dizzy. This can increase your chance of falling. Ask your doctor what other things that you can do to help prevent falls. This information is not intended to replace advice given to you by your health care provider. Make sure you discuss any questions you have with your health care provider. Document Released: 10/26/2008 Document Revised: 06/07/2015 Document Reviewed: 02/03/2014 Elsevier Interactive Patient Education  2017 [employer].

## 2022-08-22 NOTE — Progress Notes (Signed)
Subjective:   Jade Ellison is a 77 y.o. female who presents for Medicare Annual (Subsequent) preventive examination.  Visit Complete: Virtual  I connected with  [name] on 08/22/22 by a audio enabled telemedicine application and verified that I am speaking with the correct person using two identifiers.  Patient Location: Home  Provider Location: Office/Clinic  I discussed the limitations of evaluation and management by telemedicine. The patient expressed understanding and agreed to proceed.  Vital Signs: Unable to obtain new vitals due to this being a telehealth visit.  Review of Systems     Cardiac Risk Factors include: advanced age (>75men, >17 women);diabetes mellitus;dyslipidemia;hypertension;obesity (BMI >30kg/m2)     Objective:    Today's Vitals   There is no height or weight on file to calculate BMI.     08/22/2022    3:59 PM 11/12/2021    9:10 PM 11/07/2019   10:08 AM 08/01/2015   11:15 AM 03/01/2014    4:35 PM 02/05/2012    7:49 AM 09/19/2011   10:00 PM  Advanced Directives  Does Patient Have a Medical Advance Directive? No No No No No Patient does not have advance directive Patient does not have advance directive  Would patient like information on creating a medical advance directive?  Yes (MAU/Ambulatory/Procedural Areas - Information given) Yes (MAU/Ambulatory/Procedural Areas - Information given) Yes - Educational materials given No - patient declined information    Pre-existing out of facility DNR order (yellow form or pink MOST form)      No No    Current Medications (verified) Outpatient Encounter Medications as of 08/22/2022  Medication Sig   acetaminophen (TYLENOL) 325 MG tablet Take 650 mg by mouth every 6 (six) hours as needed for moderate pain or headache.   ALPRAZolam (XANAX) 0.25 MG tablet Take 0.25 mg by mouth 2 (two) times daily as needed for anxiety.    atorvastatin (LIPITOR) 20 MG tablet Take 20 mg by mouth daily.   benzonatate  (TESSALON) 100 MG capsule Take 1 capsule (100 mg total) by mouth 3 (three) times daily as needed for cough.   desonide (DESOWEN) 0.05 % cream Apply 1 application topically 2 (two) times daily as needed (dry skin/irritation).    dicyclomine (BENTYL) 20 MG tablet TAKE 1 TABLET(20 MG) BY MOUTH THREE TIMES DAILY AFTER MEALS (Patient taking differently: Take 20 mg by mouth 2 (two) times daily.)   DULoxetine (CYMBALTA) 60 MG capsule Take 60 mg by mouth daily.   ELIQUIS 5 MG TABS tablet TAKE 1 TABLET(5 MG) BY MOUTH TWICE DAILY   glipiZIDE (GLUCOTROL XL) 5 MG 24 hr tablet Take 5 mg by mouth daily.   latanoprost (XALATAN) 0.005 % ophthalmic solution Place 1 drop into both eyes at bedtime.    losartan-hydrochlorothiazide (HYZAAR) 100-12.5 MG tablet Take 1 tablet by mouth daily.   metoprolol tartrate (LOPRESSOR) 50 MG tablet Take 50 mg by mouth 2 (two) times daily.   Mouthwashes (BIOTENE/CALCIUM MT) Take 1 Dose by mouth daily as needed (dry mouth).    Multiple Vitamin (MULTIVITAMIN WITH MINERALS) TABS tablet Take 1 tablet by mouth daily.   omeprazole (PRILOSEC) 40 MG capsule TAKE 1 CAPSULE(40 MG) BY MOUTH DAILY (Patient taking differently: Take 40 mg by mouth daily.)   pregabalin (LYRICA) 50 MG capsule TAKE 1 CAPSULE(50 MG) BY MOUTH TWICE DAILY   psyllium (METAMUCIL SMOOTH TEXTURE) 28 % packet Take 1 packet by mouth daily as needed (constipation).    saccharomyces boulardii (FLORASTOR) 250 MG capsule Take 1  capsule (250 mg total) by mouth daily.   Cyanocobalamin (B-12 PO) Take 1 capsule by mouth daily. (Patient not taking: Reported on 07/29/2022)   ferrous sulfate 325 (65 FE) MG tablet Take 325 mg by mouth daily. (Patient not taking: Reported on 07/02/2022)   No facility-administered encounter medications on file as of 08/22/2022.    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 pylori)    Hemorrhoids    Hernia of unspecified site of abdominal cavity without mention of obstruction or gangrene    History of blood transfusion    History of kidney stones    Hypertension 09 07 2013   TRANSTHORACIC ECHO STUDY CONCLUSIONS    Hypertension 09 07 2013   EJECTION FRACTION- 55%-60% .WALL MOTION WAS NORMAL   Impaired mobility    Iron deficiency anemia    Morbid obesity (HCC)    Neuropathy    Obesity    Olecranon bursitis of left elbow    OSA (obstructive sleep apnea)    On cpap   Osteoarthritis    right hip   Osteoporosis    Primary osteoarthritis    Bilaterally (knee)   PSVT (paroxysmal supraventricular tachycardia)    PSVT (paroxysmal supraventricular tachycardia)    Short-term memory loss    Shortness of breath dyspnea    Sleep apnea    on CPAP- does not use machine   Spinal headache 1986   Type 2 diabetes mellitus (HCC)    Past Surgical History:  Procedure Laterality Date   BIOPSY  11/07/2019   Procedure: BIOPSY;  Surgeon: Napoleon Form, MD;  Location: [facility];  Service: Endoscopy;;   CARDIAC CATHETERIZATION  01/13/2001   normal coronary arteries and nomal LV function by cath performed by Dr.Peter Nishan   CESAREAN SECTION  01/14/1984   CHOLECYSTECTOMY     COLONOSCOPY WITH PROPOFOL N/A 11/07/2019   Procedure: COLONOSCOPY WITH PROPOFOL;  Surgeon: Napoleon Form, MD;  Location: [facility];  Service: Endoscopy;  Laterality: N/A;   ENTEROSCOPY  02/05/2012   Procedure: ENTEROSCOPY;   Surgeon: Hart Carwin, MD;  Location: [facility];  Service: Endoscopy;  Laterality: N/A;   ESOPHAGOGASTRODUODENOSCOPY N/A 04/19/2014   Procedure: ESOPHAGOGASTRODUODENOSCOPY (EGD);  Surgeon: Hart Carwin, MD;  Location: [facility];  Service: Endoscopy;  Laterality: N/A;   ESOPHAGOGASTRODUODENOSCOPY (EGD) WITH PROPOFOL N/A 08/01/2015   Procedure: ESOPHAGOGASTRODUODENOSCOPY (EGD) WITH PROPOFOL;  Surgeon: Napoleon Form, MD;  Location: [facility];  Service: Endoscopy;  Laterality: N/A;   ESOPHAGOGASTRODUODENOSCOPY (EGD) WITH PROPOFOL N/A 11/07/2019   Procedure: ESOPHAGOGASTRODUODENOSCOPY (EGD) WITH PROPOFOL;  Surgeon: Napoleon Form, MD;  Location: [facility];  Service: Endoscopy;  Laterality: N/A;   EXTRACORPOREAL SHOCK WAVE LITHOTRIPSY Left    HERNIA REPAIR     HOT HEMOSTASIS  02/05/2012  Procedure: HOT HEMOSTASIS (ARGON PLASMA COAGULATION/BICAP);  Surgeon: Hart Carwin, MD;  Location: [facility];  Service: Endoscopy;  Laterality: N/A;   KNEE ARTHROSCOPY Left    POLYPECTOMY  11/07/2019   Procedure: POLYPECTOMY;  Surgeon: Napoleon Form, MD;  Location: [facility];  Service: Endoscopy;;   TUBAL LIGATION     UMBILICAL HERNIA REPAIR     Family History  Problem Relation Age of Onset   Heart disease Mother    Heart disease Father    Diabetes Sister    Stroke Brother    Hypertension Brother    Diabetes Brother    Cancer Brother        Renal   Cancer Brother        Prostate   Heart disease Brother    Heart disease Brother    Cancer Brother        Prostate, bladder   Diabetes Paternal Grandmother    Colon cancer Neg Hx    Social History   Socioeconomic History   Marital status: Widowed    Spouse name: Not on file   Number of children: 2   Years of education: Not on file   Highest education level: Some college, no degree  Occupational History   Occupation: RETIRED    Employer: RETIRED  Tobacco Use   Smoking status: Never    Passive exposure: Never    Smokeless tobacco: Never  Vaping Use   Vaping status: Never Used  Substance and Sexual Activity   Alcohol use: Yes    Alcohol/week: 1.0 standard drink of alcohol    Types: 1 Glasses of wine per week    Comment: wine sometimes on holidays and bedtime- rarely   Drug use: No   Sexual activity: Not on file  Other Topics Concern   Not on file  Social History Narrative   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   Social Determinants of Health   Financial Resource Strain: Low Risk  (08/22/2022)   Overall Financial Resource Strain (CARDIA)    Difficulty of Paying Living Expenses: Not hard at all  Food Insecurity: No Food Insecurity (08/22/2022)   Hunger Vital Sign    Worried About Running Out of Food in the Last Year: Never true    Ran Out of Food in the Last Year: Never true  Transportation Needs: No Transportation Needs (08/22/2022)   PRAPARE - Administrator, Civil Service (Medical): No    Lack of Transportation (Non-Medical): No  Physical Activity: Sufficiently Active (08/22/2022)   Exercise Vital Sign    Days of Exercise per Week: 7 days    Minutes of Exercise per Session: 30 min  Stress: No Stress Concern Present (08/22/2022)   [employer] of Occupational Health - Occupational Stress Questionnaire    Feeling of Stress : Only a little  Social Connections: Moderately Isolated (08/22/2022)   Social Connection and Isolation Panel [NHANES]    Frequency of Communication with Friends and Family: More than three times a week    Frequency of Social Gatherings with Friends and Family: Once a week    Attends Religious Services: More than 4 times per year    Active Member of [employer] or Organizations: No    Attends Banker Meetings: Never    Marital Status: Widowed    Tobacco Counseling Counseling given: Not Answered   Clinical Intake:  Pre-visit preparation completed: Yes        Nutritional Risks: Nausea/ vomitting/  diarrhea (diarrhea in past two  weeks has resolved) Diabetes: Yes CBG done?: No Did pt. bring in CBG monitor from home?: No  How often do you need to have someone help you when you read instructions, pamphlets, or other written materials from your doctor or pharmacy?: 1 - Never  Interpreter Needed?: No  Information entered by :: NAllen LPN   Activities of Daily Living    08/22/2022    3:51 PM  In your present state of health, do you have any difficulty performing the following activities:  Hearing? 0  Vision? 0  Difficulty concentrating or making decisions? 1  Walking or climbing stairs? 1  Comment non ambulatory  Dressing or bathing? 1  Doing errands, shopping? 1  Preparing Food and eating ? N  Using the Toilet? Y  In the past six months, have you accidently leaked urine? Y  Comment has a catheter  Do you have problems with loss of bowel control? N  Managing your Medications? Y  Comment daughter sets up pill box  Managing your Finances? N  Housekeeping or managing your Housekeeping? N    Patient Care Team: Loyola Mast, MD as PCP - General (Family Medicine) Runell Gess, MD as Consulting Physician (Cardiology) O'Neal, Ronnald Ramp, MD as Consulting Physician (Cardiology) Micki Riley, MD as Consulting Physician (Neurology)  Indicate any recent Medical Services you may have received from other than [facility] providers in the past year (date may be approximate).     Assessment:   This is a routine wellness examination for [place].  Hearing/Vision screen Hearing Screening - Comments:: Denies hearing issues Vision Screening - Comments:: Regular eye exams,   Dietary issues and exercise activities discussed:     Goals Addressed             This Visit's Progress    Patient Stated       08/22/2022, trying to walk again       Depression Screen    08/22/2022    4:01 PM 07/29/2022   10:36 AM 07/02/2022    4:06 PM  PHQ 2/9 Scores  PHQ - 2 Score 0 4 4  PHQ- 9 Score 1 14 14     Fall  Risk    08/22/2022    4:00 PM 07/29/2022   10:35 AM 07/02/2022    2:51 PM  Fall Risk   Falls in the past year? Exclusion - non ambulatory 0 0  Number falls in past yr: 0 0 0  Injury with Fall? 0 0 0  Risk for fall due to : Medication side effect;Impaired mobility;Impaired balance/gait No Fall Risks   Follow up  Falls prevention discussed     MEDICARE RISK AT HOME:  Medicare Risk at Home - 08/22/22 1600     Any stairs in or around the home? No    If so, are there any without handrails? No    Home free of loose throw rugs in walkways, pet beds, electrical cords, etc? Yes    Adequate lighting in your home to reduce risk of falls? Yes    Life alert? No    Use of a cane, walker or w/c? Yes    Grab bars in the bathroom? Yes    Shower chair or bench in shower? Yes    Elevated toilet seat or a handicapped toilet? Yes             TIMED UP AND GO:  Was the test performed?  No  Cognitive Function:    05/04/2018    4:00 PM  MMSE - Mini Mental State Exam  Orientation to time 5  Orientation to Place 5  Registration 3  Attention/ Calculation 3  Recall 3  Language- name 2 objects 2  Language- repeat 1  Language- follow 3 step command 3  Language- read & follow direction 1  Write a sentence 1  Copy design 0  Total score 27        08/22/2022    4:02 PM  6CIT Screen  What Year? 0 points  What month? 0 points  What time? 0 points  Count back from 20 0 points  Months in reverse 0 points  Repeat phrase 2 points  Total Score 2 points    Immunizations Immunization History  Administered Date(s) Administered   Fluad Quad(high Dose 65+) 11/16/2015, 12/01/2016, 09/18/2017, 11/23/2019, 11/29/2020   Influenza, High Dose Seasonal PF 10/16/2011, 10/26/2014, 10/06/2018, 10/06/2018   Influenza,trivalent, recombinat, inj, PF 11/16/2015, 12/01/2016, 09/18/2017   Influenza-Unspecified 11/16/2015, 12/01/2016, 09/18/2017   Moderna Covid Bivalent Peds Booster(66mo Thru 44yrs) 11/25/2021    Moderna Sars-Covid-2 Vaccination 02/25/2019, 03/25/2019   PFIZER(Purple Top)SARS-COV-2 Vaccination 11/23/2019   Pfizer Covid-19 Vaccine Bivalent Booster 21yrs & up 11/29/2020, 03/26/2022   Pneumococcal Polysaccharide-23 09/13/2009   RSV,unspecified 11/25/2021    TDAP status: Due, Education has been provided regarding the importance of this vaccine. Advised may receive this vaccine at local pharmacy or Health Dept. Aware to provide a copy of the vaccination record if obtained from local pharmacy or Health Dept. Verbalized acceptance and understanding.  Flu Vaccine status: Due, Education has been provided regarding the importance of this vaccine. Advised may receive this vaccine at local pharmacy or Health Dept. Aware to provide a copy of the vaccination record if obtained from local pharmacy or Health Dept. Verbalized acceptance and understanding.  Pneumococcal vaccine status: Due, Education has been provided regarding the importance of this vaccine. Advised may receive this vaccine at local pharmacy or Health Dept. Aware to provide a copy of the vaccination record if obtained from local pharmacy or Health Dept. Verbalized acceptance and understanding.  Covid-19 vaccine status: Information provided on how to obtain vaccines.   Qualifies for Shingles Vaccine? Yes   Zostavax completed No   Shingrix Completed?: No.    Education has been provided regarding the importance of this vaccine. Patient has been advised to call insurance company to determine out of pocket expense if they have not yet received this vaccine. Advised may also receive vaccine at local pharmacy or Health Dept. Verbalized acceptance and understanding.  Screening Tests Health Maintenance  Topic Date Due   OPHTHALMOLOGY EXAM  Never done   Diabetic kidney evaluation - Urine ACR  Never done   Hepatitis C Screening  Never done   DTaP/Tdap/Td (1 - Tdap) Never done   Zoster Vaccines- Shingrix (1 of 2) Never done   Pneumonia  Vaccine 41+ Years old (2 of 2 - PCV) 09/14/2010   COVID-19 Vaccine (7 - 2023-24 season) 05/21/2022   INFLUENZA VACCINE  08/14/2022   HEMOGLOBIN A1C  01/01/2023   Diabetic kidney evaluation - eGFR measurement  07/02/2023   FOOT EXAM  07/02/2023   Medicare Annual Wellness (AWV)  08/22/2023   DEXA SCAN  Completed   HPV VACCINES  Aged Out   Colonoscopy  Discontinued    Health Maintenance  Health Maintenance Due  Topic Date Due   OPHTHALMOLOGY EXAM  Never done   Diabetic kidney evaluation - Urine ACR  Never done   Hepatitis C Screening  Never done   DTaP/Tdap/Td (1 - Tdap) Never done   Zoster Vaccines- Shingrix (1 of 2) Never done   Pneumonia Vaccine 29+ Years old (2 of 2 - PCV) 09/14/2010   COVID-19 Vaccine (7 - 2023-24 season) 05/21/2022   INFLUENZA VACCINE  08/14/2022    Colorectal cancer screening: No longer required.   Mammogram status: scheduled for 08/26/2022  Bone Density status: scheduled for 02/20/2023  Lung Cancer Screening: (Low Dose CT Chest recommended if Age 46-80 years, 20 pack-year currently smoking OR have quit w/in 15years.) does not qualify.   Lung Cancer Screening Referral: no  Additional Screening:  Hepatitis C Screening: does qualify;   Vision Screening: Recommended annual ophthalmology exams for early detection of glaucoma and other disorders of the eye. Is the patient up to date with their annual eye exam?  No  Who is the provider or what is the name of the office in which the patient attends annual eye exams? Looking for new doctor If pt is not established with a provider, would they like to be referred to a provider to establish care? No .   Dental Screening: Recommended annual dental exams for proper oral hygiene  Diabetic Foot Exam: Diabetic Foot Exam: Completed 07/02/2022  Community Resource Referral / Chronic Care Management: CRR required this visit?  No   CCM required this visit?  No     Plan:     I have personally reviewed and noted  the following in the patient's chart:   Medical and social history Use of alcohol, tobacco or illicit drugs  Current medications and supplements including opioid prescriptions. Patient is not currently taking opioid prescriptions. Functional ability and status Nutritional status Physical activity Advanced directives List of other physicians Hospitalizations, surgeries, and ER visits in previous 12 months Vitals Screenings to include cognitive, depression, and falls Referrals and appointments  In addition, I have reviewed and discussed with patient certain preventive protocols, quality metrics, and best practice recommendations. A written personalized care plan for preventive services as well as general preventive health recommendations were provided to patient.     Barb Merino, LPN   04/16/100   After Visit Summary: (MyChart) Due to this being a telephonic visit, the after visit summary with patients personalized plan was offered to patient via MyChart   Nurse Notes: none

## 2022-08-26 ENCOUNTER — Ambulatory Visit: Payer: Medicare HMO

## 2022-08-28 ENCOUNTER — Encounter (INDEPENDENT_AMBULATORY_CARE_PROVIDER_SITE_OTHER): Payer: Self-pay

## 2022-09-11 ENCOUNTER — Other Ambulatory Visit: Payer: Self-pay | Admitting: Family Medicine

## 2022-09-11 ENCOUNTER — Telehealth: Payer: Self-pay | Admitting: Family Medicine

## 2022-09-11 NOTE — Telephone Encounter (Signed)
Form place on providers desk to be filled out.  Dm/cma

## 2022-09-11 NOTE — Telephone Encounter (Signed)
Patient dropped off document Home Health Certificate (Order ID  ), to be filled out by provider. Patient requested to send it back via Fax within 5-days. Document is located in providers tray at front office.Please advise at Mobile [phone] (mobile)   Fax came through for pt. I put in the dr box

## 2022-09-12 DIAGNOSIS — M8580 Other specified disorders of bone density and structure, unspecified site: Secondary | ICD-10-CM

## 2022-09-12 DIAGNOSIS — E1151 Type 2 diabetes mellitus with diabetic peripheral angiopathy without gangrene: Secondary | ICD-10-CM

## 2022-09-12 DIAGNOSIS — M160 Bilateral primary osteoarthritis of hip: Secondary | ICD-10-CM | POA: Diagnosis not present

## 2022-09-12 DIAGNOSIS — I4891 Unspecified atrial fibrillation: Secondary | ICD-10-CM

## 2022-09-12 DIAGNOSIS — I129 Hypertensive chronic kidney disease with stage 1 through stage 4 chronic kidney disease, or unspecified chronic kidney disease: Secondary | ICD-10-CM | POA: Diagnosis not present

## 2022-09-12 DIAGNOSIS — Z9181 History of falling: Secondary | ICD-10-CM

## 2022-09-12 DIAGNOSIS — E1142 Type 2 diabetes mellitus with diabetic polyneuropathy: Secondary | ICD-10-CM

## 2022-09-12 DIAGNOSIS — Z7985 Long-term (current) use of injectable non-insulin antidiabetic drugs: Secondary | ICD-10-CM

## 2022-09-12 DIAGNOSIS — Z7982 Long term (current) use of aspirin: Secondary | ICD-10-CM

## 2022-09-12 DIAGNOSIS — Z87440 Personal history of urinary (tract) infections: Secondary | ICD-10-CM

## 2022-09-12 DIAGNOSIS — K589 Irritable bowel syndrome without diarrhea: Secondary | ICD-10-CM

## 2022-09-12 DIAGNOSIS — Z993 Dependence on wheelchair: Secondary | ICD-10-CM

## 2022-09-12 DIAGNOSIS — Z7901 Long term (current) use of anticoagulants: Secondary | ICD-10-CM

## 2022-09-12 DIAGNOSIS — Z6841 Body Mass Index (BMI) 40.0 and over, adult: Secondary | ICD-10-CM

## 2022-09-12 DIAGNOSIS — Z7984 Long term (current) use of oral hypoglycemic drugs: Secondary | ICD-10-CM

## 2022-09-12 DIAGNOSIS — Z792 Long term (current) use of antibiotics: Secondary | ICD-10-CM

## 2022-09-12 DIAGNOSIS — E7849 Other hyperlipidemia: Secondary | ICD-10-CM

## 2022-09-12 DIAGNOSIS — E1122 Type 2 diabetes mellitus with diabetic chronic kidney disease: Secondary | ICD-10-CM | POA: Diagnosis not present

## 2022-09-12 DIAGNOSIS — Z556 Problems related to health literacy: Secondary | ICD-10-CM

## 2022-09-12 DIAGNOSIS — N183 Chronic kidney disease, stage 3 unspecified: Secondary | ICD-10-CM | POA: Diagnosis not present

## 2022-09-12 DIAGNOSIS — I2510 Atherosclerotic heart disease of native coronary artery without angina pectoris: Secondary | ICD-10-CM

## 2022-09-12 DIAGNOSIS — Z604 Social exclusion and rejection: Secondary | ICD-10-CM

## 2022-09-12 DIAGNOSIS — E1169 Type 2 diabetes mellitus with other specified complication: Secondary | ICD-10-CM

## 2022-09-12 DIAGNOSIS — I69351 Hemiplegia and hemiparesis following cerebral infarction affecting right dominant side: Secondary | ICD-10-CM

## 2022-09-12 DIAGNOSIS — F419 Anxiety disorder, unspecified: Secondary | ICD-10-CM

## 2022-09-12 DIAGNOSIS — M170 Bilateral primary osteoarthritis of knee: Secondary | ICD-10-CM

## 2022-09-16 NOTE — Telephone Encounter (Signed)
Form was faxed. Dm/cma

## 2022-10-08 ENCOUNTER — Encounter: Payer: Self-pay | Admitting: Family Medicine

## 2022-10-08 ENCOUNTER — Telehealth: Payer: Medicare HMO | Admitting: Family Medicine

## 2022-10-08 VITALS — Ht 63.0 in | Wt 260.0 lb

## 2022-10-08 DIAGNOSIS — L299 Pruritus, unspecified: Secondary | ICD-10-CM | POA: Diagnosis not present

## 2022-10-08 DIAGNOSIS — N1831 Chronic kidney disease, stage 3a: Secondary | ICD-10-CM

## 2022-10-08 DIAGNOSIS — M170 Bilateral primary osteoarthritis of knee: Secondary | ICD-10-CM

## 2022-10-08 DIAGNOSIS — I480 Paroxysmal atrial fibrillation: Secondary | ICD-10-CM | POA: Diagnosis not present

## 2022-10-08 DIAGNOSIS — E1142 Type 2 diabetes mellitus with diabetic polyneuropathy: Secondary | ICD-10-CM

## 2022-10-08 DIAGNOSIS — M160 Bilateral primary osteoarthritis of hip: Secondary | ICD-10-CM

## 2022-10-08 DIAGNOSIS — Z23 Encounter for immunization: Secondary | ICD-10-CM

## 2022-10-08 DIAGNOSIS — Z7984 Long term (current) use of oral hypoglycemic drugs: Secondary | ICD-10-CM

## 2022-10-08 DIAGNOSIS — E1122 Type 2 diabetes mellitus with diabetic chronic kidney disease: Secondary | ICD-10-CM

## 2022-10-08 LAB — HEMOGLOBIN A1C: Hgb A1c MFr Bld: 7.1 % — ABNORMAL HIGH (ref 4.6–6.5)

## 2022-10-08 MED ORDER — PREGABALIN 50 MG PO CAPS
50.0000 mg | ORAL_CAPSULE | Freq: Two times a day (BID) | ORAL | 3 refills | Status: DC
Start: 2022-10-08 — End: 2023-04-20

## 2022-10-08 MED ORDER — HYDROCODONE-ACETAMINOPHEN 10-325 MG PO TABS
1.0000 | ORAL_TABLET | Freq: Three times a day (TID) | ORAL | 0 refills | Status: DC | PRN
Start: 2022-10-08 — End: 2023-02-13

## 2022-10-08 MED ORDER — DESONIDE 0.05 % EX CREA: 1.0000 | TOPICAL_CREAM | Freq: Two times a day (BID) | CUTANEOUS | 1 refills | Status: AC | PRN

## 2022-10-08 MED ORDER — METOPROLOL TARTRATE 50 MG PO TABS
50.0000 mg | ORAL_TABLET | Freq: Two times a day (BID) | ORAL | 2 refills | Status: DC
Start: 2022-10-08 — End: 2023-08-10

## 2022-10-08 MED ORDER — APIXABAN 5 MG PO TABS
5.0000 mg | ORAL_TABLET | Freq: Two times a day (BID) | ORAL | 5 refills | Status: DC
Start: 2022-10-08 — End: 2023-09-09

## 2022-10-08 NOTE — Assessment & Plan Note (Signed)
Rate well controlled. Continue metoprolol tartrate 25 mg bid and apixiban 5 mg bid.

## 2022-10-08 NOTE — Assessment & Plan Note (Signed)
I will check LFTs and TSH. I recommend she apply daily lotion.

## 2022-10-08 NOTE — Assessment & Plan Note (Signed)
I will have her come in for lab tests, so we can check her A1c and complete her annual micral. Continue glipizide 5 mg daily and metformin 1,000 mg bid.

## 2022-10-08 NOTE — Assessment & Plan Note (Signed)
Rescue Vicodin for use 1-2 times a week for more severe pain is working well. She will continue to follow with orthopedics.

## 2022-10-08 NOTE — Assessment & Plan Note (Signed)
As above.

## 2022-10-08 NOTE — Progress Notes (Unsigned)
[facility] [address] [place] [address] Dept: [phone] Dept Fax: [phone]  Virtual Video Visit  I connected with Jade Ellison on 10/08/22 at  1:20 PM EDT by a video enabled telemedicine application and verified that I am speaking with the correct person using two identifiers.  Location patient: Home Location provider: Clinic Persons participating in the virtual visit: Patient, daughter Tresa Endo), grand daughter, and Provider  I discussed the limitations of evaluation and management by telemedicine and the availability of in person appointments. The patient expressed understanding and agreed to proceed.  Chief Complaint  Patient presents with   Follow-up    3 month f/u.  C/o having some itching all over.  Was in AFIB 2-3 days ago.    SUBJECTIVE:  HPI: Jade Ellison is a 77 y.o. female who presents with  Ms. Borsch has a history of atrial fibrillation. This was found at the time when she presented with a lacunar stroke last Fall. She is currently managed on metoprolol tartrate 25 mg bid and apixiban 5 mg bid. She has had some irregular heart beat recently, without significant symptoms. This was associated with Ms. Bartelt receiving exciting news, which may have influenced this.   Ms. Benge has a history of hyperlipidemia. She is managed on atorvastatin 20 mg daily.   Ms. Drum has a history of Type 2 diabetes. She is currently managed on glipizide 5 mg daily and metformin 1,000 mg bid. She has peripheral neuropathy and manages with duloxetine and pregabalin 50 mg daily.   Ms. Igel has a history of hypertension. She is managed on losartan-hydrochlorothiazide (Hyzaar) 100-12.5 mg daily.   Ms. Oberholzer has a history of severe arthritis of the hips and knees. She is managed on periodic Vicodin for pain management.  Ms. Denman notes a generalized itching of her skin. This is not associated with any rash.    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) 10/06/2021   Paroxysmal atrial fibrillation (HCC) 10/06/2021   Polyp of transverse colon    Polyp of ascending colon    Recurrent UTI 01/16/2019   Short-term memory loss 10/06/2018   GAD (generalized anxiety disorder) 09/20/2017   Irritable bowel syndrome with both constipation and diarrhea 09/20/2017   Chronic kidney disease, stage 3a (HCC) 10/13/2016   Diabetic peripheral neuropathy associated with type 2 diabetes mellitus (HCC) 11/17/2014   Barrett's esophagus without dysplasia 06/26/2014   Dyslipidemia 05/24/2013   SVT (supraventricular tachycardia) 09/19/2011   Aphagia 09/19/2011   Osteopenia of the elderly 07/29/2011   History of lower GI bleeding 04/10/2011   Normocytic anemia 04/10/2011   Class 3 severe obesity due to excess calories with serious comorbidity and body mass index (BMI) of 50.0 to 59.9 in adult (HCC) 04/10/2011   Mixed incontinence 04/07/2007   Essential hypertension 03/25/2007   Diverticulosis of colon 03/25/2007   OSA (obstructive sleep apnea) 03/25/2007   History of kidney stones 03/25/2007   Hemorrhoids 03/25/2007   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/2007   Primary osteoarthritis of both knees 12/15/2006   CAD (coronary artery disease) 12/15/2006   Past Surgical History:  Procedure Laterality Date  BIOPSY  11/07/2019   Procedure: BIOPSY;  Surgeon: Napoleon Form, MD;  Location: [facility];  Service: Endoscopy;;   CARDIAC CATHETERIZATION  01/13/2001   normal coronary arteries and nomal LV function by cath performed by Dr.Peter Nishan   CESAREAN SECTION  01/14/1984    CHOLECYSTECTOMY     COLONOSCOPY WITH PROPOFOL N/A 11/07/2019   Procedure: COLONOSCOPY WITH PROPOFOL;  Surgeon: Napoleon Form, MD;  Location: [facility];  Service: Endoscopy;  Laterality: N/A;   ENTEROSCOPY  02/05/2012   Procedure: ENTEROSCOPY;  Surgeon: Hart Carwin, MD;  Location: [facility];  Service: Endoscopy;  Laterality: N/A;   ESOPHAGOGASTRODUODENOSCOPY N/A 04/19/2014   Procedure: ESOPHAGOGASTRODUODENOSCOPY (EGD);  Surgeon: Hart Carwin, MD;  Location: [facility];  Service: Endoscopy;  Laterality: N/A;   ESOPHAGOGASTRODUODENOSCOPY (EGD) WITH PROPOFOL N/A 08/01/2015   Procedure: ESOPHAGOGASTRODUODENOSCOPY (EGD) WITH PROPOFOL;  Surgeon: Napoleon Form, MD;  Location: [facility];  Service: Endoscopy;  Laterality: N/A;   ESOPHAGOGASTRODUODENOSCOPY (EGD) WITH PROPOFOL N/A 11/07/2019   Procedure: ESOPHAGOGASTRODUODENOSCOPY (EGD) WITH PROPOFOL;  Surgeon: Napoleon Form, MD;  [facility]: [facility];  Service: Endoscopy;  Laterality: N/A;   EXTRACORPOREAL SHOCK WAVE LITHOTRIPSY Left    HERNIA REPAIR     HOT HEMOSTASIS  02/05/2012   Procedure: HOT HEMOSTASIS (ARGON PLASMA COAGULATION/BICAP);  Surgeon: Hart Carwin, MD;  Location: [facility];  Service: Endoscopy;  Laterality: N/A;   KNEE ARTHROSCOPY Left    POLYPECTOMY  11/07/2019   Procedure: POLYPECTOMY;  Surgeon: Napoleon Form, MD;  [facility]: [facility];  Service: Endoscopy;;   TUBAL LIGATION     UMBILICAL HERNIA REPAIR     Family History  Problem Relation Age of Onset   Heart disease Mother    Heart disease Father    Diabetes Sister    Stroke Brother    Hypertension Brother    Diabetes Brother    Cancer Brother        Renal   Cancer Brother        Prostate   Heart disease Brother    Heart disease Brother    Cancer Brother        Prostate, bladder   Diabetes Paternal Grandmother    Colon cancer Neg Hx    Social History   Tobacco Use   Smoking status: Never    Passive exposure: Never    Smokeless tobacco: Never  Vaping Use   Vaping status: Never Used  Substance Use Topics   Alcohol use: Yes    Alcohol/week: 1.0 standard drink of alcohol    Types: 1 Glasses of wine per week    Comment: wine sometimes on holidays and bedtime- rarely   Drug use: No    Current Outpatient Medications:    acetaminophen (TYLENOL) 325 MG tablet, Take 650 mg by mouth every 6 (six) hours as needed for moderate pain or headache., Disp: , Rfl:    ALPRAZolam (XANAX) 0.25 MG tablet, Take 0.25 mg by mouth 2 (two) times daily as needed for anxiety. , Disp: , Rfl:    atorvastatin (LIPITOR) 20 MG tablet, Take 20 mg by mouth daily., Disp: , Rfl:    dicyclomine (BENTYL) 20 MG tablet, TAKE 1 TABLET(20 MG) BY MOUTH THREE TIMES DAILY AFTER MEALS (Patient taking differently: Take 20 mg by mouth 2 (two) times daily.), Disp: 90 tablet, Rfl: 0   DULoxetine (CYMBALTA) 60 MG capsule, Take 60 mg by mouth daily., Disp: , Rfl:    glipiZIDE (GLUCOTROL XL) 5 MG 24 hr tablet,  Take 5 mg by mouth daily., Disp: , Rfl:    latanoprost (XALATAN) 0.005 % ophthalmic solution, Place 1 drop into both eyes at bedtime. , Disp: , Rfl:    losartan-hydrochlorothiazide (HYZAAR) 100-12.5 MG tablet, Take 1 tablet by mouth daily., Disp: , Rfl: 11   metFORMIN (GLUCOPHAGE) 1000 MG tablet, Take 1,000 mg by mouth 2 (two) times daily., Disp: , Rfl:    Mouthwashes (BIOTENE/CALCIUM MT), Take 1 Dose by mouth daily as needed (dry mouth). , Disp: , Rfl:    Multiple Vitamin (MULTIVITAMIN WITH MINERALS) TABS tablet, Take 1 tablet by mouth daily., Disp: , Rfl:    omeprazole (PRILOSEC) 40 MG capsule, TAKE 1 CAPSULE(40 MG) BY MOUTH DAILY (Patient taking differently: Take 40 mg by mouth daily.), Disp: 90 capsule, Rfl: 0   psyllium (METAMUCIL SMOOTH TEXTURE) 28 % packet, Take 1 packet by mouth daily as needed (constipation). , Disp: , Rfl:    saccharomyces boulardii (FLORASTOR) 250 MG capsule, Take 1 capsule (250 mg total) by mouth daily., Disp: 30 capsule,  Rfl: 0   apixaban (ELIQUIS) 5 MG TABS tablet, Take 1 tablet (5 mg total) by mouth 2 (two) times daily., Disp: 60 tablet, Rfl: 5   desonide (DESOWEN) 0.05 % cream, Apply 1 Application topically 2 (two) times daily as needed (dry skin/irritation)., Disp: 30 g, Rfl: 1   HYDROcodone-acetaminophen (NORCO) 10-325 MG tablet, Take 1 tablet by mouth every 8 (eight) hours as needed., Disp: 30 tablet, Rfl: 0   metoprolol tartrate (LOPRESSOR) 50 MG tablet, Take 1 tablet (50 mg total) by mouth 2 (two) times daily., Disp: 180 tablet, Rfl: 2   pregabalin (LYRICA) 50 MG capsule, Take 1 capsule (50 mg total) by mouth 2 (two) times daily., Disp: 180 capsule, Rfl: 3 Allergies  Allergen Reactions   Neurontin [Gabapentin] Other (See Comments)    Dizziness   Codeine Rash   Fosamax [Alendronate Sodium] Other (See Comments)    Upset stomach    Nsaids Other (See Comments)    Abdominal Pain   ROS: See pertinent positives and negatives per HPI.  OBSERVATIONS/OBJECTIVE:  VITALS per patient if applicable: Today's Vitals   10/08/22 1314  Weight: 260 lb (117.9 kg)  Height: 5\' 3"  (1.6 m)   Body mass index is 46.06 kg/m.   GENERAL: Alert and oriented. Appears well and in no acute distress. HEENT: Atraumatic. Eyes clear. No obvious abnormalities on inspection of external nose and ears. NECK: Normal movements of the head and neck. LUNGS: On inspection, no signs of respiratory distress. Breathing rate appears normal. No obvious gross SOB, gasping or wheezing, and no conversational dyspnea. CV: No obvious cyanosis. MS: Moves all visible extremities without noticeable abnormality. PSYCH/NEURO: Pleasant and cooperative. No obvious depression or anxiety. Speech and thought processing grossly intact.  ASSESSMENT AND PLAN:  Problem List Items Addressed This Visit       Cardiovascular and Mediastinum   Paroxysmal atrial fibrillation (HCC)    Rate well controlled. Continue metoprolol tartrate 25 mg bid and apixiban  5 mg bid.      Relevant Medications   metoprolol tartrate (LOPRESSOR) 50 MG tablet   apixaban (ELIQUIS) 5 MG TABS tablet     Endocrine   Diabetic peripheral neuropathy associated with type 2 diabetes mellitus (HCC)    Stable. Continue duloxetine 60 mg daily and pregabalin 50 mg daily      Relevant Medications   metFORMIN (GLUCOPHAGE) 1000 MG tablet   pregabalin (LYRICA) 50 MG capsule   Type 2 diabetes mellitus  with stage 3a chronic kidney disease (HCC) - Primary    I will have her come in for lab tests, so we can check her A1c and complete her annual micral. Continue glipizide 5 mg daily and metformin 1,000 mg bid.      Relevant Medications   metFORMIN (GLUCOPHAGE) 1000 MG tablet   Other Relevant Orders   Hemoglobin A1c (Completed)   Microalbumin / creatinine urine ratio     Musculoskeletal and Integument   Primary osteoarthritis of both hips    Rescue Vicodin for use 1-2 times a week for more severe pain is working well. She will continue to follow with orthopedics.      Relevant Medications   HYDROcodone-acetaminophen (NORCO) 10-325 MG tablet   Primary osteoarthritis of both knees    As above.      Relevant Medications   HYDROcodone-acetaminophen (NORCO) 10-325 MG tablet   Pruritus    I will check LFTs and TSH. I recommend she apply daily lotion.      Relevant Medications   desonide (DESOWEN) 0.05 % cream   Other Relevant Orders   Hepatic function panel   TSH   Other Visit Diagnoses     Need for pneumococcal vaccination       Relevant Orders   Pneumococcal conjugate vaccine 13-valent IM        I discussed the assessment and treatment plan with the patient. The patient was provided an opportunity to ask questions and all were answered. The patient agreed with the plan and demonstrated an understanding of the instructions.   The patient was advised to call back or seek an in-person evaluation if the symptoms worsen or if the condition fails to improve as  anticipated.  Return in about 3 months (around 01/07/2023) for Reassessment.   Loyola Mast, MD

## 2022-10-08 NOTE — Assessment & Plan Note (Signed)
Stable. Continue duloxetine 60 mg daily and pregabalin 50 mg daily

## 2022-10-09 LAB — HEPATIC FUNCTION PANEL
ALT: 9 U/L (ref 0–35)
AST: 17 U/L (ref 0–37)
Albumin: 4.1 g/dL (ref 3.5–5.2)
Alkaline Phosphatase: 82 U/L (ref 39–117)
Bilirubin, Direct: 0.2 mg/dL (ref 0.0–0.3)
Total Bilirubin: 0.8 mg/dL (ref 0.2–1.2)
Total Protein: 7.2 g/dL (ref 6.0–8.3)

## 2022-10-09 LAB — TSH: TSH: 1.98 u[IU]/mL (ref 0.35–5.50)

## 2022-10-27 ENCOUNTER — Telehealth: Payer: Self-pay | Admitting: Family Medicine

## 2022-10-27 NOTE — Telephone Encounter (Signed)
10/27/22 - Pt's daughter dropped off FMLA form for her mom to be filled out by the provider. She wants a call back at [phone] when form is ready for pick up.

## 2022-10-28 NOTE — Telephone Encounter (Signed)
Left detailed VM to rtn call to schedule appointment.   WILL NEED TO PICK UP PAPER WORK TO FILL OUT HER PORTION BEFORE THE APPOINTMENT. Dm/cma

## 2022-11-03 ENCOUNTER — Ambulatory Visit: Payer: Medicare HMO | Admitting: Family Medicine

## 2022-11-03 ENCOUNTER — Telehealth: Payer: Self-pay | Admitting: Family Medicine

## 2022-11-03 NOTE — Telephone Encounter (Signed)
NS no reason available. Letter printed

## 2022-11-03 NOTE — Telephone Encounter (Signed)
Patient NSH for the appt. Dm/cma

## 2022-11-04 NOTE — Telephone Encounter (Signed)
 1st no show, letter sent via mail

## 2022-11-18 ENCOUNTER — Ambulatory Visit: Payer: Medicare HMO | Admitting: Family Medicine

## 2022-11-18 ENCOUNTER — Encounter: Payer: Self-pay | Admitting: Family Medicine

## 2022-11-18 VITALS — BP 124/82 | HR 90 | Temp 98.0°F | Ht 63.0 in | Wt 260.0 lb

## 2022-11-18 DIAGNOSIS — E1122 Type 2 diabetes mellitus with diabetic chronic kidney disease: Secondary | ICD-10-CM

## 2022-11-18 DIAGNOSIS — Z6841 Body Mass Index (BMI) 40.0 and over, adult: Secondary | ICD-10-CM

## 2022-11-18 DIAGNOSIS — M170 Bilateral primary osteoarthritis of knee: Secondary | ICD-10-CM

## 2022-11-18 DIAGNOSIS — E66813 Obesity, class 3: Secondary | ICD-10-CM | POA: Diagnosis not present

## 2022-11-18 DIAGNOSIS — E1142 Type 2 diabetes mellitus with diabetic polyneuropathy: Secondary | ICD-10-CM

## 2022-11-18 DIAGNOSIS — Z7984 Long term (current) use of oral hypoglycemic drugs: Secondary | ICD-10-CM

## 2022-11-18 DIAGNOSIS — K5903 Drug induced constipation: Secondary | ICD-10-CM | POA: Diagnosis not present

## 2022-11-18 DIAGNOSIS — M160 Bilateral primary osteoarthritis of hip: Secondary | ICD-10-CM

## 2022-11-18 DIAGNOSIS — Z23 Encounter for immunization: Secondary | ICD-10-CM

## 2022-11-18 DIAGNOSIS — R300 Dysuria: Secondary | ICD-10-CM

## 2022-11-18 DIAGNOSIS — Z8673 Personal history of transient ischemic attack (TIA), and cerebral infarction without residual deficits: Secondary | ICD-10-CM | POA: Diagnosis not present

## 2022-11-18 DIAGNOSIS — K219 Gastro-esophageal reflux disease without esophagitis: Secondary | ICD-10-CM

## 2022-11-18 DIAGNOSIS — T402X5A Adverse effect of other opioids, initial encounter: Secondary | ICD-10-CM

## 2022-11-18 DIAGNOSIS — N1831 Chronic kidney disease, stage 3a: Secondary | ICD-10-CM

## 2022-11-18 LAB — HM DIABETES EYE EXAM

## 2022-11-18 MED ORDER — DULOXETINE HCL 60 MG PO CPEP
60.0000 mg | ORAL_CAPSULE | Freq: Every day | ORAL | 3 refills | Status: DC
Start: 2022-11-18 — End: 2023-11-05

## 2022-11-18 MED ORDER — OMEPRAZOLE 40 MG PO CPDR
DELAYED_RELEASE_CAPSULE | ORAL | 0 refills | Status: DC
Start: 2022-11-18 — End: 2022-11-18

## 2022-11-18 MED ORDER — LINACLOTIDE 72 MCG PO CAPS
72.0000 ug | ORAL_CAPSULE | Freq: Every day | ORAL | 3 refills | Status: DC
Start: 2022-11-18 — End: 2023-02-13

## 2022-11-18 MED ORDER — OMEPRAZOLE 40 MG PO CPDR
40.0000 mg | DELAYED_RELEASE_CAPSULE | Freq: Every day | ORAL | 0 refills | Status: DC
Start: 2022-11-18 — End: 2023-02-06

## 2022-11-18 NOTE — Assessment & Plan Note (Signed)
We will complete her annual micral today. Continue glipizide 5 mg daily and metformin 1,000 mg bid.

## 2022-11-18 NOTE — Progress Notes (Signed)
[facility] [address] [place] [address] Dept: [phone] Dept Fax: [phone]  Chronic Care Office Visit  Subjective:    Patient ID: Jade Ellison, female    DOB: Jun 06, 1945, 77 y.o..   MRN: [id number]  Chief Complaint  Patient presents with   Follow-up    F/u and fill out form for daughter.  Having congestion and N&V x 3 days.   Has taken Coricidin   History of Present Illness:  Patient is in today for reassessment of chronic medical issues.  Jade Ellison has a history of atrial fibrillation. This was found at the time when she presented with a lacunar stroke last Fall. She is currently managed on metoprolol tartrate 25 mg bid and apixiban 5 mg bid. Jade Ellison has had severely restricted mobility since then, compounded by hip and knee arthritis and her morbid obesity. She is dependent on her daughter to provide assistance with her care. Her daughter is requesting FMLA papers to allow her to take time to care for her mother when needed.   Jade Ellison has a history of Type 2 diabetes. She is currently managed on glipizide 5 mg daily and metformin 1,000 mg bid. She has peripheral neuropathy and manages with duloxetine and pregabalin 50 mg daily.   Jade Ellison has a history of hypertension. She is managed on losartan-hydrochlorothiazide (Hyzaar) 100-12.5 mg daily.   Jade Ellison notes some recent dysuria. She is concerned about a possible UTI.  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) 10/06/2021   Paroxysmal atrial fibrillation (HCC) 10/06/2021   Polyp of  transverse colon    Polyp of ascending colon    Recurrent UTI 01/16/2019   Short-term memory loss 10/06/2018   GAD (generalized anxiety disorder) 09/20/2017   Irritable bowel syndrome with both constipation and diarrhea 09/20/2017   Chronic kidney disease, stage 3a (HCC) 10/13/2016   Gastroesophageal reflux disease without esophagitis    Diabetic peripheral neuropathy associated with type 2 diabetes mellitus (HCC) 11/17/2014   Barrett's esophagus without dysplasia 06/26/2014   Dyslipidemia 05/24/2013   SVT (supraventricular tachycardia) (HCC) 09/19/2011   Aphagia 09/19/2011   Osteopenia of the elderly 07/29/2011   History of lower GI bleeding 04/10/2011   Normocytic anemia 04/10/2011   Class 3 severe obesity due to excess calories with serious comorbidity and body mass index (BMI) of 50.0 to 59.9 in adult (HCC) 04/10/2011   Mixed incontinence 04/07/2007   Essential hypertension 03/25/2007   Diverticulosis of colon 03/25/2007   OSA (obstructive sleep apnea) 03/25/2007   History of kidney stones 03/25/2007   Hemorrhoids 03/25/2007   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/2007   Primary osteoarthritis of both knees 12/15/2006   CAD (coronary artery disease) 12/15/2006   Past Surgical History:  Procedure Laterality Date   BIOPSY  11/07/2019   Procedure: BIOPSY;  Surgeon: Napoleon Form, MD;  [facility]: [facility];  Service: Endoscopy;;   CARDIAC CATHETERIZATION  01/13/2001   normal coronary arteries and nomal LV function by cath performed by Dr.Peter Nishan   CESAREAN SECTION  01/14/1984   CHOLECYSTECTOMY     COLONOSCOPY  WITH PROPOFOL N/A 11/07/2019   Procedure: COLONOSCOPY WITH PROPOFOL;  Surgeon: Napoleon Form, MD;  Location: [facility];  Service: Endoscopy;  Laterality: N/A;   ENTEROSCOPY  02/05/2012   Procedure: ENTEROSCOPY;  Surgeon: Hart Carwin, MD;  Location: [facility];  Service: Endoscopy;  Laterality: N/A;    ESOPHAGOGASTRODUODENOSCOPY N/A 04/19/2014   Procedure: ESOPHAGOGASTRODUODENOSCOPY (EGD);  Surgeon: Hart Carwin, MD;  Location: [facility];  Service: Endoscopy;  Laterality: N/A;   ESOPHAGOGASTRODUODENOSCOPY (EGD) WITH PROPOFOL N/A 08/01/2015   Procedure: ESOPHAGOGASTRODUODENOSCOPY (EGD) WITH PROPOFOL;  Surgeon: Napoleon Form, MD;  Location: [facility];  Service: Endoscopy;  Laterality: N/A;   ESOPHAGOGASTRODUODENOSCOPY (EGD) WITH PROPOFOL N/A 11/07/2019   Procedure: ESOPHAGOGASTRODUODENOSCOPY (EGD) WITH PROPOFOL;  Surgeon: Napoleon Form, MD;  [facility]: [facility];  Service: Endoscopy;  Laterality: N/A;   EXTRACORPOREAL SHOCK WAVE LITHOTRIPSY Left    HERNIA REPAIR     HOT HEMOSTASIS  02/05/2012   Procedure: HOT HEMOSTASIS (ARGON PLASMA COAGULATION/BICAP);  Surgeon: Hart Carwin, MD;  Location: [facility];  Service: Endoscopy;  Laterality: N/A;   KNEE ARTHROSCOPY Left    POLYPECTOMY  11/07/2019   Procedure: POLYPECTOMY;  Surgeon: Napoleon Form, MD;  [facility]: [facility];  Service: Endoscopy;;   TUBAL LIGATION     UMBILICAL HERNIA REPAIR     Family History  Problem Relation Age of Onset   Heart disease Mother    Heart disease Father    Diabetes Sister    Stroke Brother    Hypertension Brother    Diabetes Brother    Cancer Brother        Renal   Cancer Brother        Prostate   Heart disease Brother    Heart disease Brother    Cancer Brother        Prostate, bladder   Diabetes Paternal Grandmother    Colon cancer Neg Hx    Outpatient Medications Prior to Visit  Medication Sig Dispense Refill   acetaminophen (TYLENOL) 325 MG tablet Take 650 mg by mouth every 6 (six) hours as needed for moderate pain or headache.     apixaban (ELIQUIS) 5 MG TABS tablet Take 1 tablet (5 mg total) by mouth 2 (two) times daily. 60 tablet 5   atorvastatin (LIPITOR) 20 MG tablet Take 20 mg by mouth daily.     desonide (DESOWEN) 0.05 % cream Apply 1 Application topically 2  (two) times daily as needed (dry skin/irritation). 30 g 1   dicyclomine (BENTYL) 20 MG tablet TAKE 1 TABLET(20 MG) BY MOUTH THREE TIMES DAILY AFTER MEALS (Patient taking differently: Take 20 mg by mouth 2 (two) times daily.) 90 tablet 0   glipiZIDE (GLUCOTROL XL) 5 MG 24 hr tablet Take 5 mg by mouth daily.     HYDROcodone-acetaminophen (NORCO) 10-325 MG tablet Take 1 tablet by mouth every 8 (eight) hours as needed. 30 tablet 0   latanoprost (XALATAN) 0.005 % ophthalmic solution Place 1 drop into both eyes at bedtime.      losartan-hydrochlorothiazide (HYZAAR) 100-12.5 MG tablet Take 1 tablet by mouth daily.  11   metFORMIN (GLUCOPHAGE) 1000 MG tablet Take 1,000 mg by mouth 2 (two) times daily.     metoprolol tartrate (LOPRESSOR) 50 MG tablet Take 1 tablet (50 mg total) by mouth 2 (two) times daily. 180 tablet 2   Mouthwashes (BIOTENE/CALCIUM MT) Take 1 Dose by mouth daily as needed (dry mouth).      Multiple Vitamin (MULTIVITAMIN WITH MINERALS) TABS  tablet Take 1 tablet by mouth daily.     pregabalin (LYRICA) 50 MG capsule Take 1 capsule (50 mg total) by mouth 2 (two) times daily. 180 capsule 3   psyllium (METAMUCIL SMOOTH TEXTURE) 28 % packet Take 1 packet by mouth daily as needed (constipation).      saccharomyces boulardii (FLORASTOR) 250 MG capsule Take 1 capsule (250 mg total) by mouth daily. 30 capsule 0   DULoxetine (CYMBALTA) 60 MG capsule Take 60 mg by mouth daily.     omeprazole (PRILOSEC) 40 MG capsule TAKE 1 CAPSULE(40 MG) BY MOUTH DAILY (Patient taking differently: Take 40 mg by mouth daily.) 90 capsule 0   ALPRAZolam (XANAX) 0.25 MG tablet Take 0.25 mg by mouth 2 (two) times daily as needed for anxiety.  (Patient not taking: Reported on 11/18/2022)     No facility-administered medications prior to visit.   Allergies  Allergen Reactions   Neurontin [Gabapentin] Other (See Comments)    Dizziness   Codeine Rash   Fosamax [Alendronate Sodium] Other (See Comments)    Upset stomach     Nsaids Other (See Comments)    Abdominal Pain   Objective:   Today's Vitals   11/18/22 1314  BP: 124/82  Pulse: 90  Temp: 98 F (36.7 C)  TempSrc: Temporal  SpO2: 98%  Weight: 260 lb (117.9 kg)  Height: 5\' 3"  (1.6 m)   Body mass index is 46.06 kg/m.   General: Well developed, well nourished. No acute distress. Psych: Alert and oriented. Normal mood and affect.  Health Maintenance Due  Topic Date Due   OPHTHALMOLOGY EXAM  Never done   Diabetic kidney evaluation - Urine ACR  Never done   Hepatitis C Screening  Never done   DTaP/Tdap/Td (1 - Tdap) Never done   Zoster Vaccines- Shingrix (1 of 2) Never done   Pneumonia Vaccine 47+ Years old (2 of 2 - PCV) 09/14/2010   INFLUENZA VACCINE  08/14/2022     Assessment & Plan:   Problem List Items Addressed This Visit       Digestive   Gastroesophageal reflux disease without esophagitis    Stable. I will renew her omeprazole.      Relevant Medications   linaclotide (LINZESS) 72 MCG capsule   omeprazole (PRILOSEC) 40 MG capsule   Opioid-induced constipation    Ms. Bushee complains of constipation associated with her opioid use. She has not found that other approaches are helpful. Her daughter has had to manually disimpact her at times. I will see if she can afford Linzess.      Relevant Medications   linaclotide (LINZESS) 72 MCG capsule     Endocrine   Diabetic peripheral neuropathy associated with type 2 diabetes mellitus (HCC)    Stable. Continue duloxetine 60 mg daily and pregabalin 50 mg daily      Relevant Medications   DULoxetine (CYMBALTA) 60 MG capsule   Type 2 diabetes mellitus with stage 3a chronic kidney disease (HCC)    We will complete her annual micral today. Continue glipizide 5 mg daily and metformin 1,000 mg bid.      Relevant Orders   Microalbumin / creatinine urine ratio     Musculoskeletal and Integument   Primary osteoarthritis of both hips    I will complete FMLA papers for Ms. Ebeling's  daughter.      Primary osteoarthritis of both knees    As above.        Other   Class 3 severe obesity  due to excess calories with serious comorbidity and body mass index (BMI) of 50.0 to 59.9 in adult [facility])    I will complete FMLA papers for Ms. Fornes's daughter.      History of stroke - Primary    Stable. On apixiban for stroke prevention due to her a fib. I will complete FMLA papers for Ms. Morrish's daughter.      Other Visit Diagnoses     Dysuria       I will check a UA to evaluate for possible UTI.   Relevant Orders   Urinalysis w microscopic + reflex cultur   Need for immunization against influenza       Relevant Orders   Flu Vaccine Trivalent High Dose (Fluad) (Completed)   Need for vaccination against Streptococcus pneumoniae using pneumococcal conjugate vaccine 13       Relevant Orders   Pneumococcal conjugate vaccine 13-valent IM (Completed)       Return in about 3 months (around 02/18/2023) for Reassessment.   Loyola Mast, MD

## 2022-11-18 NOTE — Assessment & Plan Note (Signed)
I will complete FMLA papers for Ms. Knapper's daughter.

## 2022-11-18 NOTE — Assessment & Plan Note (Signed)
Stable. I will renew her omeprazole.

## 2022-11-18 NOTE — Assessment & Plan Note (Signed)
Stable. Continue duloxetine 60 mg daily and pregabalin 50 mg daily

## 2022-11-18 NOTE — Assessment & Plan Note (Signed)
Stable. On apixiban for stroke prevention due to her a fib. I will complete FMLA papers for Ms. Shanahan's daughter.

## 2022-11-18 NOTE — Assessment & Plan Note (Addendum)
Ms. Jade Ellison complains of constipation associated with her opioid use. She has not found that other approaches are helpful. Her daughter has had to manually disimpact her at times. I will see if she can afford Linzess.

## 2022-11-18 NOTE — Assessment & Plan Note (Signed)
As above.

## 2023-02-05 ENCOUNTER — Ambulatory Visit: Payer: Self-pay | Admitting: Family Medicine

## 2023-02-05 NOTE — Telephone Encounter (Signed)
Copied from CRM [id number]. Topic: Clinical - Red Word Triage >> Feb 05, 2023  3:12 PM Alona Bene A wrote: Red Word that prompted transfer to Nurse Triage: Swelling   Chief Complaint: Swelling---both legs have been swollen but right leg is more swollen than the left Symptoms: swelling Frequency: x 2 weeks Pertinent Negatives: Patient denies fever, redness, chest pain, difficulty breathing, blood in urine, pain with urination, pain in her extremities Disposition: [] ED /[] Urgent Care (no appt availability in office) / [] Appointment(In office/virtual)/ []  [facility]/ [] Home Care/ [x] Refused Recommended Disposition /[] [facility]/ []  Follow-up with PCP Additional Notes: Patient called and advised that she is bed confined and she uses a wheelchair if she is out of bed.  She had a bed that lifts up and down.  She states that she has started to have knots in her hips and feet and she is having swelling from her right hip all the way down to her foot.  She said that she normally sleeps on this side.  She also states that her urine is darker than normal.  She said she has had a lot of UTIs.  She denies fever, redness, chest pain, difficulty breathing, blood in urine, pain with urination, or any pain in her extremities.  Patient  is advised that with her leg being much more swollen than the other, it is recommended that she been seen and assessed in the next 4 hours by a provider.  There is no immediate availability in the office at this time and she states that she didn't want to have to leave her home due to the weather and icy roads where she lives.  She states that her daughter gets off work at 4:30pm today and her hope was that maybe her daughter could show her PCP a video of her leg to see what she would need to do.  She is advised of the recommendation and also that a urine sample would be needed to test for a UTI.  She advised that she just wanted to hear back from her doctor first.  She is  also advised that if anything gets worse or she develops chest pain, difficulty breathing, or severe pain to go ahead and call 911 to get taken to the emergency room to be further evaluated.  She verbalized understanding and said she would just like a response from her PCP about same.  Reason for Disposition  [1] Thigh, calf, or ankle swelling AND [2] bilateral AND [3] 1 side is more swollen  Answer Assessment - Initial Assessment Questions 1. ONSET: "When did the swelling start?" (e.g., minutes, hours, days)     2 weeks ago 2. LOCATION: "What part of the leg is swollen?"  "Are both legs swollen or just one leg?"     Right leg from hip to foot and both legs have knots on them 3. SEVERITY: "How bad is the swelling?" (e.g., localized; mild, moderate, severe)   - Localized: Small area of swelling localized to one leg.   - MILD pedal edema: Swelling limited to foot and ankle, pitting edema < 1/4 inch (6 mm) deep, rest and elevation eliminate most or all swelling.   - MODERATE edema: Swelling of lower leg to knee, pitting edema > 1/4 inch (6 mm) deep, rest and elevation only partially reduce swelling.   - SEVERE edema: Swelling extends above knee, facial or hand swelling present.      "Right side is 40% more than the left side"  4. REDNESS: "Does the swelling look red or infected?"     No 5. PAIN: "Is the swelling painful to touch?" If Yes, ask: "How painful is it?"   (Scale 1-10; mild, moderate or severe)     No 6. FEVER: "Do you have a fever?" If Yes, ask: "What is it, how was it measured, and when did it start?"      No 7. CAUSE: "What do you think is causing the leg swelling?"     "I sleep on that side" 8. MEDICAL HISTORY: "Do you have a history of blood clots (e.g., DVT), cancer, heart failure, kidney disease, or liver failure?"     No 9. RECURRENT SYMPTOM: "Have you had leg swelling before?" If Yes, ask: "When was the last time?" "What happened that time?"     No 10. OTHER SYMPTOMS: "Do  you have any other symptoms?" (e.g., chest pain, difficulty breathing)       No  Protocols used: Leg Swelling and Edema-A-AH

## 2023-02-05 NOTE — Telephone Encounter (Signed)
Spoke to patient and schedule her an appointment to come in for an appointment on 02/12/23 @ 2:20 pm. Dm/cma

## 2023-02-06 ENCOUNTER — Other Ambulatory Visit: Payer: Self-pay | Admitting: Family Medicine

## 2023-02-06 DIAGNOSIS — K219 Gastro-esophageal reflux disease without esophagitis: Secondary | ICD-10-CM

## 2023-02-12 ENCOUNTER — Telehealth: Payer: Self-pay

## 2023-02-12 NOTE — Telephone Encounter (Signed)
Copied from CRM [id number]. Topic: General - Other >> Feb 12, 2023 10:57 AM Elizebeth Brooking wrote: Reason for CRM: Patient daughter called in stating since the weather tomorrow is suppose to be bad is there a way the patient can change her appointment to vitural and also since she has to do some labs is she able to get the labs done if they car if they drive to the clinic for the labs. Is asking for a callback at [phone]  Wilfrid Lund, daughter, advised that per provider that it needs to be in person visit.  She will try her best to get her her. Dm/cma

## 2023-02-13 ENCOUNTER — Encounter: Payer: Self-pay | Admitting: Family Medicine

## 2023-02-13 ENCOUNTER — Ambulatory Visit (INDEPENDENT_AMBULATORY_CARE_PROVIDER_SITE_OTHER): Payer: Medicare HMO | Admitting: Family Medicine

## 2023-02-13 VITALS — BP 126/78 | HR 87 | Temp 97.9°F | Ht 63.0 in

## 2023-02-13 DIAGNOSIS — E1122 Type 2 diabetes mellitus with diabetic chronic kidney disease: Secondary | ICD-10-CM

## 2023-02-13 DIAGNOSIS — I2510 Atherosclerotic heart disease of native coronary artery without angina pectoris: Secondary | ICD-10-CM

## 2023-02-13 DIAGNOSIS — M170 Bilateral primary osteoarthritis of knee: Secondary | ICD-10-CM

## 2023-02-13 DIAGNOSIS — Z7985 Long-term (current) use of injectable non-insulin antidiabetic drugs: Secondary | ICD-10-CM

## 2023-02-13 DIAGNOSIS — N1831 Chronic kidney disease, stage 3a: Secondary | ICD-10-CM | POA: Diagnosis not present

## 2023-02-13 DIAGNOSIS — I480 Paroxysmal atrial fibrillation: Secondary | ICD-10-CM | POA: Diagnosis not present

## 2023-02-13 DIAGNOSIS — I10 Essential (primary) hypertension: Secondary | ICD-10-CM

## 2023-02-13 DIAGNOSIS — R609 Edema, unspecified: Secondary | ICD-10-CM

## 2023-02-13 DIAGNOSIS — Z7984 Long term (current) use of oral hypoglycemic drugs: Secondary | ICD-10-CM | POA: Diagnosis not present

## 2023-02-13 DIAGNOSIS — F411 Generalized anxiety disorder: Secondary | ICD-10-CM

## 2023-02-13 DIAGNOSIS — M160 Bilateral primary osteoarthritis of hip: Secondary | ICD-10-CM

## 2023-02-13 LAB — COMPREHENSIVE METABOLIC PANEL
ALT: 9 U/L (ref 0–35)
AST: 18 U/L (ref 0–37)
Albumin: 3.8 g/dL (ref 3.5–5.2)
Alkaline Phosphatase: 83 U/L (ref 39–117)
BUN: 20 mg/dL (ref 6–23)
CO2: 22 meq/L (ref 19–32)
Calcium: 9.1 mg/dL (ref 8.4–10.5)
Chloride: 95 meq/L — ABNORMAL LOW (ref 96–112)
Creatinine, Ser: 0.99 mg/dL (ref 0.40–1.20)
GFR: 54.81 mL/min — ABNORMAL LOW (ref >60.00)
Glucose, Bld: 207 mg/dL — ABNORMAL HIGH (ref 70–99)
Potassium: 4.3 meq/L (ref 3.5–5.1)
Sodium: 133 meq/L — ABNORMAL LOW (ref 135–145)
Total Bilirubin: 0.6 mg/dL (ref 0.2–1.2)
Total Protein: 6.9 g/dL (ref 6.0–8.3)

## 2023-02-13 LAB — HEMOGLOBIN A1C: Hgb A1c MFr Bld: 8.1 % — ABNORMAL HIGH (ref 4.6–6.5)

## 2023-02-13 LAB — BRAIN NATRIURETIC PEPTIDE: Pro B Natriuretic peptide (BNP): 507 pg/mL — ABNORMAL HIGH (ref 0.0–100.0)

## 2023-02-13 MED ORDER — HYDROCODONE-ACETAMINOPHEN 10-325 MG PO TABS: 1.0000 | ORAL_TABLET | Freq: Three times a day (TID) | ORAL | 0 refills | Status: DC | PRN

## 2023-02-13 MED ORDER — FUROSEMIDE 20 MG PO TABS: 20.0000 mg | ORAL_TABLET | Freq: Every day | ORAL | 0 refills | Status: DC

## 2023-02-13 MED ORDER — TIRZEPATIDE 2.5 MG/0.5ML ~~LOC~~ SOAJ: 2.5000 mg | SUBCUTANEOUS | 0 refills | Status: DC

## 2023-02-13 MED ORDER — TIRZEPATIDE 5 MG/0.5ML ~~LOC~~ SOAJ: 5.0000 mg | SUBCUTANEOUS | 3 refills | Status: AC

## 2023-02-13 MED ORDER — ALPRAZOLAM 0.25 MG PO TABS: 0.2500 mg | ORAL_TABLET | Freq: Two times a day (BID) | ORAL | 0 refills | Status: DC | PRN

## 2023-02-13 NOTE — Assessment & Plan Note (Signed)
I will reassess her renal function, esp. in light of edema issues. Continue focus on blood pressure and glucose control, adequate hydration, and avoidance of nephrotoxic medications.

## 2023-02-13 NOTE — Assessment & Plan Note (Signed)
Blood pressure is in adequate control. Continue losartan-hydrochlorothiazide (Hyzaar) 100-12.5 mg daily.

## 2023-02-13 NOTE — Assessment & Plan Note (Signed)
I will check her A1c today. Continue glipizide 5 mg daily and metformin 1,000 mg bid. Her A1c's have been > 7%. I will try adding tirzepatide Greggory Keen) to see if we can get better control.

## 2023-02-13 NOTE — Assessment & Plan Note (Signed)
Stable without angina. Continue metoprolol tartrate 50 mg bid and lipid management.

## 2023-02-13 NOTE — Progress Notes (Signed)
[facility] [address] [place] [address] Dept: [phone] Dept Fax: [phone]  Chronic Care Office Visit  Subjective:    Patient ID: Jade Ellison, female    DOB: [birth year], 78 y.o..   MRN: [id number]  Chief Complaint  Patient presents with   Leg Swelling    C/o having    History of Present Illness:  Patient is in today for reassessment of chronic medical issues.  Jade Ellison has a history of atrial fibrillation. This was found at the time when she presented with a lacunar stroke last Fall. She is currently managed on metoprolol tartrate 25 mg bid and apixiban 5 mg bid. Jade Ellison has had severely restricted mobility since then, compounded by hip and knee arthritis and her morbid obesity. She is dependent on her Ellison to provide assistance with her care. More recently, Jade Ellison has noted dependent edema, primarily around the lower back/buttocks/hips while she is laying in the bed. However, she does also get some swelling in her lower extremities when up. She has not noted any increase in her shortness of breath.   Jade Ellison has a history of Type 2 diabetes. She is currently managed on glipizide 5 mg daily and metformin 1,000 mg bid. She has peripheral neuropathy and manages with duloxetine 60 mg daily and pregabalin 50 mg daily.   Jade Ellison has a history of hypertension. She is managed on losartan-hydrochlorothiazide (Hyzaar) 100-12.5 mg daily.   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) 10/06/2021   Paroxysmal atrial fibrillation  (HCC) 10/06/2021   Polyp of transverse colon    Polyp of ascending colon    Recurrent UTI 01/16/2019   Short-term memory loss 10/06/2018   GAD (generalized anxiety disorder) 09/20/2017   Irritable bowel syndrome with both constipation and diarrhea 09/20/2017   Chronic kidney disease, stage 3a (HCC) 10/13/2016   Gastroesophageal reflux disease without esophagitis    Diabetic peripheral neuropathy associated with type 2 diabetes mellitus (HCC) 11/17/2014   Barrett's esophagus without dysplasia 06/26/2014   Dyslipidemia 05/24/2013   SVT (supraventricular tachycardia) (HCC) 09/19/2011   Aphagia 09/19/2011   Osteopenia of the elderly 07/29/2011   History of lower GI bleeding 04/10/2011   Normocytic anemia 04/10/2011   Class 3 severe obesity due to excess calories with serious comorbidity and body mass index (BMI) of 50.0 to 59.9 in adult (HCC) 04/10/2011   Mixed incontinence 04/07/2007   Essential hypertension 03/25/2007   Diverticulosis of colon 03/25/2007   OSA (obstructive sleep apnea) 03/25/2007   History of kidney stones 03/25/2007   Hemorrhoids 03/25/2007   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/2007   Primary osteoarthritis of both knees 12/15/2006   CAD (coronary artery disease) 12/15/2006   Past Surgical History:  Procedure Laterality Date   BIOPSY  11/07/2019   Procedure: BIOPSY;  Surgeon: Napoleon Form, MD;  Location: [facility];  Service: Endoscopy;;   CARDIAC CATHETERIZATION  01/13/2001   normal coronary arteries and nomal LV function by cath performed by Dr.Peter Nishan   CESAREAN SECTION  01/14/1984   CHOLECYSTECTOMY     COLONOSCOPY WITH  PROPOFOL N/A 11/07/2019   Procedure: COLONOSCOPY WITH PROPOFOL;  Surgeon: Napoleon Form, MD;  Location: [facility];  Service: Endoscopy;  Laterality: N/A;   ENTEROSCOPY  02/05/2012   Procedure: ENTEROSCOPY;  Surgeon: Hart Carwin, MD;  Location: [facility];  Service: Endoscopy;  Laterality:  N/A;   ESOPHAGOGASTRODUODENOSCOPY N/A 04/19/2014   Procedure: ESOPHAGOGASTRODUODENOSCOPY (EGD);  Surgeon: Hart Carwin, MD;  Location: [facility];  Service: Endoscopy;  Laterality: N/A;   ESOPHAGOGASTRODUODENOSCOPY (EGD) WITH PROPOFOL N/A 08/01/2015   Procedure: ESOPHAGOGASTRODUODENOSCOPY (EGD) WITH PROPOFOL;  Surgeon: Napoleon Form, MD;  Location: [facility];  Service: Endoscopy;  Laterality: N/A;   ESOPHAGOGASTRODUODENOSCOPY (EGD) WITH PROPOFOL N/A 11/07/2019   Procedure: ESOPHAGOGASTRODUODENOSCOPY (EGD) WITH PROPOFOL;  Surgeon: Napoleon Form, MD;  Location: [facility];  Service: Endoscopy;  Laterality: N/A;   EXTRACORPOREAL SHOCK WAVE LITHOTRIPSY Left    HERNIA REPAIR     HOT HEMOSTASIS  02/05/2012   Procedure: HOT HEMOSTASIS (ARGON PLASMA COAGULATION/BICAP);  Surgeon: Hart Carwin, MD;  Location: [facility];  Service: Endoscopy;  Laterality: N/A;   KNEE ARTHROSCOPY Left    POLYPECTOMY  11/07/2019   Procedure: POLYPECTOMY;  Surgeon: Napoleon Form, MD;  Location: [facility];  Service: Endoscopy;;   TUBAL LIGATION     UMBILICAL HERNIA REPAIR     Family History  Problem Relation Age of Onset   Heart disease Mother    Heart disease Father    Diabetes Sister    Stroke Brother    Hypertension Brother    Diabetes Brother    Cancer Brother        Renal   Cancer Brother        Prostate   Heart disease Brother    Heart disease Brother    Cancer Brother        Prostate, bladder   Diabetes Paternal Grandmother    Colon cancer Neg Hx    Outpatient Medications Prior to Visit  Medication Sig Dispense Refill   acetaminophen (TYLENOL) 325 MG tablet Take 650 mg by mouth every 6 (six) hours as needed for moderate pain or headache.     apixaban (ELIQUIS) 5 MG TABS tablet Take 1 tablet (5 mg total) by mouth 2 (two) times daily. 60 tablet 5   atorvastatin (LIPITOR) 20 MG tablet Take 20 mg by mouth daily.     desonide (DESOWEN) 0.05 % cream Apply 1 Application  topically 2 (two) times daily as needed (dry skin/irritation). 30 g 1   dicyclomine (BENTYL) 20 MG tablet TAKE 1 TABLET(20 MG) BY MOUTH THREE TIMES DAILY AFTER MEALS (Patient taking differently: Take 20 mg by mouth 2 (two) times daily.) 90 tablet 0   DULoxetine (CYMBALTA) 60 MG capsule Take 1 capsule (60 mg total) by mouth daily. 90 capsule 3   glipiZIDE (GLUCOTROL XL) 5 MG 24 hr tablet Take 5 mg by mouth daily.     latanoprost (XALATAN) 0.005 % ophthalmic solution Place 1 drop into both eyes at bedtime.      losartan-hydrochlorothiazide (HYZAAR) 100-12.5 MG tablet Take 1 tablet by mouth daily.  11   metFORMIN (GLUCOPHAGE) 1000 MG tablet Take 1,000 mg by mouth 2 (two) times daily.     metoprolol tartrate (LOPRESSOR) 50 MG tablet Take 1 tablet (50 mg total) by mouth 2 (two) times daily. 180 tablet 2   Mouthwashes (BIOTENE/CALCIUM MT) Take 1 Dose by mouth daily as needed (dry mouth).      Multiple Vitamin (MULTIVITAMIN WITH MINERALS) TABS tablet Take 1  tablet by mouth daily.     omeprazole (PRILOSEC) 40 MG capsule TAKE 1 CAPSULE(40 MG) BY MOUTH DAILY 90 capsule 0   pregabalin (LYRICA) 50 MG capsule Take 1 capsule (50 mg total) by mouth 2 (two) times daily. 180 capsule 3   psyllium (METAMUCIL SMOOTH TEXTURE) 28 % packet Take 1 packet by mouth daily as needed (constipation).      saccharomyces boulardii (FLORASTOR) 250 MG capsule Take 1 capsule (250 mg total) by mouth daily. 30 capsule 0   ALPRAZolam (XANAX) 0.25 MG tablet Take 0.25 mg by mouth 2 (two) times daily as needed for anxiety.     HYDROcodone-acetaminophen (NORCO) 10-325 MG tablet Take 1 tablet by mouth every 8 (eight) hours as needed. 30 tablet 0   linaclotide (LINZESS) 72 MCG capsule Take 1 capsule (72 mcg total) by mouth daily before breakfast. (Patient not taking: Reported on 02/13/2023) 30 capsule 3   No facility-administered medications prior to visit.   Allergies  Allergen Reactions   Neurontin [Gabapentin] Other (See Comments)     Dizziness   Codeine Rash   Fosamax [Alendronate Sodium] Other (See Comments)    Upset stomach    Nsaids Other (See Comments)    Abdominal Pain   Objective:   Today's Vitals   02/13/23 1411  BP: 126/78  Pulse: 87  Temp: 97.9 F (36.6 C)  TempSrc: Temporal  SpO2: 98%  Height: 5\' 3"  (1.6 m)   Body mass index is 46.06 kg/m.   General: Well developed, well nourished. No acute distress. Lungs: Clear to auscultation bilaterally. No wheezing, rales or rhonchi. CV: IRRR without murmurs or rubs. Pulses 2+ bilaterally. Extremities: Knees are swollen and warm to touch bilaterally. Trace-1+ edema. Psych: Alert and oriented. Normal mood and affect.  Health Maintenance Due  Topic Date Due   OPHTHALMOLOGY EXAM  Never done   Diabetic kidney evaluation - Urine ACR  Never done   Hepatitis C Screening  Never done   DTaP/Tdap/Td (1 - Tdap) Never done   Zoster Vaccines- Shingrix (1 of 2) Never done   Pneumonia Vaccine 62+ Years old (3 of 3 - PPSV23 or PCV20) 01/13/2023     Assessment & Plan:   Problem List Items Addressed This Visit       Cardiovascular and Mediastinum   CAD (coronary artery disease)   Stable without angina. Continue metoprolol tartrate 50 mg bid and lipid management.      Relevant Medications   furosemide (LASIX) 20 MG tablet   Other Relevant Orders   Comprehensive metabolic panel   Ambulatory referral to Cardiology   Essential hypertension   Blood pressure is in adequate control. Continue losartan-hydrochlorothiazide (Hyzaar) 100-12.5 mg daily.       Relevant Medications   furosemide (LASIX) 20 MG tablet   Other Relevant Orders   Comprehensive metabolic panel   Paroxysmal atrial fibrillation (HCC) - Primary   Rate well controlled. Continue metoprolol tartrate 25 mg bid and apixiban 5 mg bid. Jade Ellison has not seen her cardiologist in more than a year. I recommend she return for ongoing monitoring.      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 Continue glipizide 5 mg daily and metformin 1,000 mg bid. Her A1c's have been > 7%. I will try  adding tirzepatide Greggory Keen) to see if we can get better control.      Relevant Medications   tirzepatide (MOUNJARO) 2.5 MG/0.5ML Pen   tirzepatide (MOUNJARO) 5 MG/0.5ML Pen   Other Relevant Orders   Hemoglobin A1c   Comprehensive metabolic panel     Musculoskeletal and Integument   Primary osteoarthritis of both hips   As above.      Relevant Medications   HYDROcodone-acetaminophen (NORCO) 10-325 MG tablet   Other Relevant Orders   Ambulatory referral to Home Health   Primary osteoarthritis of both knees   Significant inflammation. She would be a candidate for knee joint replacement were she able to lose weight and have cardiac clearance. Otherwise, we will continue chronic opioid therapy for pain management.      Relevant Medications   HYDROcodone-acetaminophen (NORCO) 10-325 MG tablet   Other Relevant Orders   Ambulatory referral to Home Health     Genitourinary   Chronic kidney disease, stage 3a (HCC)   I will reassess her renal function, esp. in light of edema issues. Continue focus on blood pressure and glucose control, adequate hydration, and avoidance of nephrotoxic medications.       Relevant Orders   Comprehensive metabolic panel     Other   GAD (generalized anxiety disorder)   Continue duloxetine 60 mg daily and judicious use of alprazolam.      Relevant Medications   ALPRAZolam (XANAX) 0.25 MG tablet   Other Visit Diagnoses       Dependent edema       Etiology unclear. I will check kidney, liver, and heart labs to assess for potential causes. I will prescribe a 7-day course of Lasix to try and reduce this.   Relevant Medications   furosemide (LASIX) 20 MG tablet   Other  Relevant Orders   Comprehensive metabolic panel   Brain natriuretic peptide       Return in about 3 months (around 05/13/2023) for Reassessment.   Loyola Mast, MD

## 2023-02-13 NOTE — Assessment & Plan Note (Signed)
Continue duloxetine 60 mg daily and judicious use of alprazolam.

## 2023-02-13 NOTE — Assessment & Plan Note (Signed)
Rate well controlled. Continue metoprolol tartrate 25 mg bid and apixiban 5 mg bid. MS. Headings has not seen her cardiologist in more than a year. I recommend she return for ongoing monitoring.

## 2023-02-13 NOTE — Assessment & Plan Note (Signed)
 As above.

## 2023-02-13 NOTE — Assessment & Plan Note (Signed)
Significant inflammation. She would be a candidate for knee joint replacement were she able to lose weight and have cardiac clearance. Otherwise, we will continue chronic opioid therapy for pain management.

## 2023-02-16 MED ORDER — LOSARTAN POTASSIUM-HCTZ 100-12.5 MG PO TABS: 1.0000 | ORAL_TABLET | Freq: Every day | ORAL | 3 refills | Status: DC

## 2023-02-17 ENCOUNTER — Ambulatory Visit: Payer: Medicare HMO | Attending: Cardiovascular Disease | Admitting: Cardiovascular Disease

## 2023-02-17 ENCOUNTER — Encounter: Payer: Self-pay | Admitting: Cardiovascular Disease

## 2023-02-17 VITALS — BP 110/64 | HR 100 | Wt 262.0 lb

## 2023-02-17 DIAGNOSIS — R609 Edema, unspecified: Secondary | ICD-10-CM

## 2023-02-17 DIAGNOSIS — R600 Localized edema: Secondary | ICD-10-CM

## 2023-02-17 DIAGNOSIS — E785 Hyperlipidemia, unspecified: Secondary | ICD-10-CM

## 2023-02-17 DIAGNOSIS — I480 Paroxysmal atrial fibrillation: Secondary | ICD-10-CM

## 2023-02-17 DIAGNOSIS — I2510 Atherosclerotic heart disease of native coronary artery without angina pectoris: Secondary | ICD-10-CM

## 2023-02-17 DIAGNOSIS — I10 Essential (primary) hypertension: Secondary | ICD-10-CM

## 2023-02-17 DIAGNOSIS — Z8673 Personal history of transient ischemic attack (TIA), and cerebral infarction without residual deficits: Secondary | ICD-10-CM

## 2023-02-17 MED ORDER — FUROSEMIDE 20 MG PO TABS: 20.0000 mg | ORAL_TABLET | Freq: Every day | ORAL | 1 refills | Status: DC

## 2023-02-17 NOTE — Assessment & Plan Note (Signed)
History of dyslipidemia on statin therapy with lipid profile performed 07/02/2022 revealing total cholesterol 103, LDL 39 and HDL 45.

## 2023-02-17 NOTE — Progress Notes (Signed)
 02/17/2023 Jade Ellison   1945/05/28  [id number]  Primary Physician Thedora, Garnette HERO, MD Primary Cardiologist: Dorn [name] Lesches MD GENI CODY [place], [place]  HPI:  Jade Ellison is a 78 y.o. morbidly overweight, widowed African American female, mother of two, grandmother to 7 grandchildren, who is accompanied by one of her daughters Burnard today. I last saw for a virtual office visit 05/28/2018.  She has seen Dr. CLEMENTEEN Betters in the office 11/29/2021.SABRA She has a history of normal coronary arteries and normal LV function by cath performed by Dr. Maude Emmer in 2003. Her other problems include obstructive sleep apnea currently weighing CPAP, hypertension, hyperlipidemia, and type 2 diabetes.   Since I saw her in the office 5 years ago she has suffered an ischemic stroke and continues to be wheelchair-bound as she was when I saw her last.  She is in persistent A-fib rate controlled on Eliquis  oral anticoagulation.  She basically lives from her bed to a chair and otherwise gets around in a wheelchair.  She apparently has had lower extremity edema right greater than left over the last several weeks and had a BNP measured at 507 on 02/13/2023.  She was treated with furosemide  20 mg which resulted in some improvement in her edema.     Current Meds  Medication Sig   acetaminophen  (TYLENOL ) 325 MG tablet Take 650 mg by mouth every 6 (six) hours as needed for moderate pain or headache.   ALPRAZolam  (XANAX ) 0.25 MG tablet Take 1 tablet (0.25 mg total) by mouth 2 (two) times daily as needed for anxiety.   apixaban  (ELIQUIS ) 5 MG TABS tablet Take 1 tablet (5 mg total) by mouth 2 (two) times daily.   atorvastatin  (LIPITOR) 20 MG tablet Take 20 mg by mouth daily.   desonide  (DESOWEN ) 0.05 % cream Apply 1 Application topically 2 (two) times daily as needed (dry skin/irritation).   dicyclomine  (BENTYL ) 20 MG tablet TAKE 1 TABLET(20 MG) BY MOUTH THREE TIMES DAILY AFTER MEALS (Patient taking differently: Take 20  mg by mouth 2 (two) times daily.)   DULoxetine  (CYMBALTA ) 60 MG capsule Take 1 capsule (60 mg total) by mouth daily.   furosemide  (LASIX ) 20 MG tablet Take 1 tablet (20 mg total) by mouth daily.   glipiZIDE  (GLUCOTROL  XL) 5 MG 24 hr tablet Take 5 mg by mouth daily.   HYDROcodone -acetaminophen  (NORCO) 10-325 MG tablet Take 1 tablet by mouth every 8 (eight) hours as needed.   latanoprost  (XALATAN ) 0.005 % ophthalmic solution Place 1 drop into both eyes at bedtime.    metFORMIN (GLUCOPHAGE) 1000 MG tablet Take 1,000 mg by mouth 2 (two) times daily.   metoprolol  tartrate (LOPRESSOR ) 50 MG tablet Take 1 tablet (50 mg total) by mouth 2 (two) times daily.   Mouthwashes (BIOTENE/CALCIUM  MT) Take 1 Dose by mouth daily as needed (dry mouth).    Multiple Vitamin (MULTIVITAMIN WITH MINERALS) TABS tablet Take 1 tablet by mouth daily.   omeprazole  (PRILOSEC) 40 MG capsule TAKE 1 CAPSULE(40 MG) BY MOUTH DAILY   pregabalin  (LYRICA ) 50 MG capsule Take 1 capsule (50 mg total) by mouth 2 (two) times daily.   psyllium (METAMUCIL SMOOTH TEXTURE) 28 % packet Take 1 packet by mouth daily as needed (constipation).    saccharomyces boulardii (FLORASTOR) 250 MG capsule Take 1 capsule (250 mg total) by mouth daily.   tirzepatide  (MOUNJARO ) 2.5 MG/0.5ML Pen Inject 2.5 mg into the skin once a week.   [DISCONTINUED] losartan -hydrochlorothiazide (HYZAAR) 100-12.5  MG tablet Take 1 tablet by mouth daily.     Allergies  Allergen Reactions   Neurontin [Gabapentin] Other (See Comments)    Dizziness   Codeine Rash   Fosamax [Alendronate Sodium] Other (See Comments)    Upset stomach    Nsaids Other (See Comments)    Abdominal Pain    Social History   Socioeconomic History   Marital status: Widowed    Spouse name: Not on file   Number of children: 2   Years of education: Not on file   Highest education level: Some college, no degree  Occupational History   Occupation: RETIRED    Employer: RETIRED  Tobacco Use    Smoking status: Never    Passive exposure: Never   Smokeless tobacco: Never  Vaping Use   Vaping status: Never Used  Substance and Sexual Activity   Alcohol use: Yes    Alcohol/week: 1.0 standard drink of alcohol    Types: 1 Glasses of wine per week    Comment: wine sometimes on holidays and bedtime- rarely   Drug use: No   Sexual activity: Not Currently  Other Topics Concern   Not on file  Social History Narrative   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   Social Drivers of Corporate Investment Banker Strain: Low Risk  (08/22/2022)   Overall Financial Resource Strain (CARDIA)    Difficulty of Paying Living Expenses: Not hard at all  Food Insecurity: No Food Insecurity (08/22/2022)   Hunger Vital Sign    Worried About Running Out of Food in the Last Year: Never true    Ran Out of Food in the Last Year: Never true  Transportation Needs: No Transportation Needs (08/22/2022)   PRAPARE - Administrator, Civil Service (Medical): No    Lack of Transportation (Non-Medical): No  Physical Activity: Sufficiently Active (08/22/2022)   Exercise Vital Sign    Days of Exercise per Week: 7 days    Minutes of Exercise per Session: 30 min  Stress: No Stress Concern Present (08/22/2022)   [employer] of Occupational Health - Occupational Stress Questionnaire    Feeling of Stress : Only a little  Social Connections: Moderately Isolated (08/22/2022)   Social Connection and Isolation Panel [NHANES]    Frequency of Communication with Friends and Family: More than three times a week    Frequency of Social Gatherings with Friends and Family: Once a week    Attends Religious Services: More than 4 times per year    Active Member of [employer] or Organizations: No    Attends Banker Meetings: Never    Marital Status: Widowed  Intimate Partner Violence: Not At Risk (08/22/2022)   Humiliation, Afraid, Rape, and Kick questionnaire    Fear of Current or Ex-Partner: No     Emotionally Abused: No    Physically Abused: No    Sexually Abused: No     Review of Systems: General: negative for chills, fever, night sweats or weight changes.  Cardiovascular: negative for chest pain, dyspnea on exertion, edema, orthopnea, palpitations, paroxysmal nocturnal dyspnea or shortness of breath Dermatological: negative for rash Respiratory: negative for cough or wheezing Urologic: negative for hematuria Abdominal: negative for nausea, vomiting, diarrhea, bright red blood per rectum, melena, or hematemesis Neurologic: negative for visual changes, syncope, or dizziness All other systems reviewed and are otherwise negative except as noted above.    Blood pressure 110/64, pulse 100, weight 262 lb (118.8  kg), SpO2 99%.  General appearance: alert and no distress Neck: no adenopathy, no carotid bruit, no JVD, supple, symmetrical, trachea midline, and thyroid  not enlarged, symmetric, no tenderness/mass/nodules Lungs: clear to auscultation bilaterally Heart: irregularly irregular rhythm Extremities: 1-2+ right lower extremity edema. Pulses: 2+ and symmetric Skin: Skin color, texture, turgor normal. No rashes or lesions Neurologic: Grossly normal  EKG EKG Interpretation Date/Time:  Tuesday February 17 2023 10:40:42 EST Ventricular Rate:  100 PR Interval:    QRS Duration:  76 QT Interval:  348 QTC Calculation: 448 R Axis:   40  Text Interpretation: Atrial fibrillation Anterior infarct , age undetermined When compared with ECG of 06-Oct-2021 15:27, PREVIOUS ECG IS PRESENT Confirmed by Court Carrier [phone]) on 02/17/2023 10:43:15 AM    ASSESSMENT AND PLAN:   Essential hypertension History of essential hypertension on losartan /hydrochlorothiazide and metoprolol .  Her blood pressure today is 110/64.  I am going to hold off on her losartan /hydrochlorothiazide.  Dyslipidemia History of dyslipidemia on statin therapy with lipid profile performed 07/02/2022 revealing total  cholesterol 103, LDL 39 and HDL 45.  Paroxysmal atrial fibrillation (HCC) History of persistent A-fib rate controlled on Eliquis  oral anticoagulation.  CAD (coronary artery disease) History of clean coronary arteries by cath performed by Dr. Delford in 2003.  History of stroke History of ischemic stroke in the past.  She has been wheelchair-bound for the last year and 1/2 to 2 years.     Carrier DOROTHA Court MD FACP,FACC,FAHA, [facility] 02/17/2023 10:58 AM

## 2023-02-17 NOTE — Assessment & Plan Note (Signed)
History of persistent A-fib rate controlled on Eliquis oral anticoagulation. 

## 2023-02-17 NOTE — Patient Instructions (Signed)
 Medication Instructions:  Your physician has recommended you make the following change in your medication:   -Stop losartan - hydrochlorothiazide (Hyzaar).  -Continue furosemide  (lasix ) 20mg  once daily.  *If you need a refill on your cardiac medications before your next appointment, please call your pharmacy*    Testing/Procedures: Your physician has requested that you have an echocardiogram. Echocardiography is a painless test that uses sound waves to create images of your heart. It provides your doctor with information about the size and shape of your heart and how well your heart's chambers and valves are working. This procedure takes approximately one hour. There are no restrictions for this procedure. Please do NOT wear cologne, perfume, aftershave, or lotions (deodorant is allowed). Please arrive 15 minutes prior to your appointment time.  Please note: We ask at that you not bring children with you during ultrasound (echo/ vascular) testing. Due to room size and safety concerns, children are not allowed in the ultrasound rooms during exams. Our front office staff cannot provide observation of children in our lobby area while testing is being conducted. An adult accompanying a patient to their appointment will only be allowed in the ultrasound room at the discretion of the ultrasound technician under special circumstances. We apologize for any inconvenience.    Follow-Up: At [facility], you and your health needs are our priority.  As part of our continuing mission to provide you with exceptional heart care, we have created designated Provider Care Teams.  These Care Teams include your primary Cardiologist (physician) and Advanced Practice Providers (APPs -  Physician Assistants and Nurse Practitioners) who all work together to provide you with the care you need, when you need it.  We recommend signing up for the patient portal called MyChart.  Sign up information is provided  on this After Visit Summary.  MyChart is used to connect with patients for Virtual Visits (Telemedicine).  Patients are able to view lab/test results, encounter notes, upcoming appointments, etc.  Non-urgent messages can be sent to your provider as well.   To learn more about what you can do with MyChart, go to [email or web address].    Your next appointment:   4-6 week(s)  Provider:   Jon Hails, PA-C, Rollo Louder, PA-C, Hao Meng, PA-C, Damien Braver, NP, or Katlyn West, NP       Then, Dorn Lesches, MD will plan to see you again in 6 month(s).    Other Instructions

## 2023-02-17 NOTE — Assessment & Plan Note (Signed)
History of essential hypertension on losartan/hydrochlorothiazide and metoprolol.  Her blood pressure today is 110/64.  I am going to hold off on her losartan/hydrochlorothiazide.

## 2023-02-17 NOTE — Assessment & Plan Note (Signed)
History of ischemic stroke in the past.  She has been wheelchair-bound for the last year and 1/2 to 2 years.

## 2023-02-17 NOTE — Addendum Note (Signed)
Addended by: Bernita Buffy on: 02/17/2023 12:03 PM   Modules accepted: Orders

## 2023-02-17 NOTE — Assessment & Plan Note (Signed)
History of clean coronary arteries by cath performed by Dr. Eden Emms in 2003.

## 2023-02-20 ENCOUNTER — Inpatient Hospital Stay: Admission: RE | Admit: 2023-02-20 | Payer: Medicare HMO | Source: Ambulatory Visit

## 2023-02-20 ENCOUNTER — Telehealth: Payer: Self-pay

## 2023-02-20 NOTE — Telephone Encounter (Signed)
 Copied from CRM [id number]. Topic: Clinical - Home Health Verbal Orders >> Feb 19, 2023  4:09 PM Leila C wrote: Caller/Agency: Rockie from [facility] [phone] needs verbal approved for plan of care: physical therapy once a week for one week, and then twice a week for 4 weeks, and then once a week for four weeks. OT eval ADL (activites of daily living) training, and nursing eval for medication and disease management.  Verbal okay given for PT, OT, nurse eval for medication. Dm/cma

## 2023-02-27 DIAGNOSIS — F411 Generalized anxiety disorder: Secondary | ICD-10-CM

## 2023-02-27 DIAGNOSIS — D6859 Other primary thrombophilia: Secondary | ICD-10-CM

## 2023-02-27 DIAGNOSIS — N1831 Chronic kidney disease, stage 3a: Secondary | ICD-10-CM | POA: Diagnosis not present

## 2023-02-27 DIAGNOSIS — E1122 Type 2 diabetes mellitus with diabetic chronic kidney disease: Secondary | ICD-10-CM | POA: Diagnosis not present

## 2023-02-27 DIAGNOSIS — I480 Paroxysmal atrial fibrillation: Secondary | ICD-10-CM | POA: Diagnosis not present

## 2023-02-27 DIAGNOSIS — K5903 Drug induced constipation: Secondary | ICD-10-CM

## 2023-02-27 DIAGNOSIS — F330 Major depressive disorder, recurrent, mild: Secondary | ICD-10-CM

## 2023-02-27 DIAGNOSIS — M150 Primary generalized (osteo)arthritis: Secondary | ICD-10-CM

## 2023-02-27 DIAGNOSIS — I129 Hypertensive chronic kidney disease with stage 1 through stage 4 chronic kidney disease, or unspecified chronic kidney disease: Secondary | ICD-10-CM | POA: Diagnosis not present

## 2023-02-27 DIAGNOSIS — D631 Anemia in chronic kidney disease: Secondary | ICD-10-CM

## 2023-02-27 DIAGNOSIS — K581 Irritable bowel syndrome with constipation: Secondary | ICD-10-CM

## 2023-02-27 DIAGNOSIS — E1142 Type 2 diabetes mellitus with diabetic polyneuropathy: Secondary | ICD-10-CM

## 2023-03-11 ENCOUNTER — Telehealth: Payer: Self-pay | Admitting: Family Medicine

## 2023-03-11 NOTE — Telephone Encounter (Signed)
 error

## 2023-03-13 ENCOUNTER — Telehealth: Payer: Self-pay

## 2023-03-13 DIAGNOSIS — M160 Bilateral primary osteoarthritis of hip: Secondary | ICD-10-CM

## 2023-03-13 DIAGNOSIS — M170 Bilateral primary osteoarthritis of knee: Secondary | ICD-10-CM

## 2023-03-13 DIAGNOSIS — Z8673 Personal history of transient ischemic attack (TIA), and cerebral infarction without residual deficits: Secondary | ICD-10-CM

## 2023-03-13 NOTE — Addendum Note (Signed)
 Addended by: Loyola Mast on: 03/13/2023 03:11 PM   Modules accepted: Orders

## 2023-03-13 NOTE — Telephone Encounter (Signed)
 Copied from CRM [id number]. Topic: Clinical - Home Health Verbal Orders >> Mar 10, 2023  4:35 PM Armenia J wrote: Caller/Agency: [place] / Occupational Therapist [facility] Callback Number: [phone] Service Requested: Occupational Therapy Frequency: 1x weekly for 6 weeks starting 03/10/2023 Any new concerns about the patient? No  *Orders confirmation can be left in callback number provided.* >> Mar 13, 2023  2:12 PM Irine Seal wrote: Aundra Millet from [facility] called to inform us that they are discharging the patient due to her insurance changing, effective 03/01, and is no longer in-network. They are requesting a new referral to be sent to a different facility for physical therapy (PT) and occupational therapy (OT). The referral is being requested to be expedited. Additionally, the patient is experiencing breakdowns in the sacral area and requires a nurse to visit.  Callback [phone]  Forwarding message above.

## 2023-03-23 ENCOUNTER — Other Ambulatory Visit: Payer: Self-pay | Admitting: Family Medicine

## 2023-03-23 DIAGNOSIS — N1831 Chronic kidney disease, stage 3a: Secondary | ICD-10-CM

## 2023-03-23 NOTE — Telephone Encounter (Signed)
 Copied from CRM [id number]. Topic: Referral - Question >> Mar 23, 2023  2:14 PM Jade Ellison wrote: Reason for CRM: Patient daughter called in due to patient switching insurance and being discarged. Advised of note "Order placed for home health" but also Wanted to know if new referral was sent to [facility] or would it be with another company. Please call [phone]

## 2023-03-23 NOTE — Telephone Encounter (Unsigned)
 Copied from CRM [id number]. Topic: Clinical - Medication Refill >> Mar 23, 2023  2:10 PM Deaijah H wrote: Most Recent Primary Care Visit:  Provider: Loyola Mast  Department: [facility]  Visit Type: OFFICE VISIT  Date: 02/13/2023  Medication: tirzepatide [facility]) 2.5 MG/0.5ML Pen  Has the patient contacted their pharmacy? Yes (Agent: If no, request that the patient contact the pharmacy for the refill. If patient does not wish to contact the pharmacy document the reason why and proceed with request.) (Agent: If yes, when and what did the pharmacy advise?) Was advised they would send Dr. Veto Kemps a request but have not   Is this the correct pharmacy for this prescription? Yes If no, delete pharmacy and type the correct one.  This is the patient's preferred pharmacy:  [id number] [place], [place] - [phone] AT [facility] OF [facility] & [address] [place] [address] Phone: [phone] Fax: [phone]   Has the prescription been filled recently? No  Is the patient out of the medication? Yes  Has the patient been seen for an appointment in the last year OR does the patient have an upcoming appointment? Yes  Can we respond through MyChart? Yes  Agent: Please be advised that Rx refills may take up to 3 business days. We ask that you follow-up with your pharmacy.

## 2023-03-23 NOTE — Telephone Encounter (Signed)
 Spoke to patient and her daughter, Jade Ellison, the referral is in the chart and they would like to know where the new referral will be going? Please review and advise.  Thanks.  Dm/cma

## 2023-03-23 NOTE — Telephone Encounter (Signed)
 Last Fill: 02/13/23  Last OV: 02/13/23 Next OV: 08/24/23  Routing to provider for review/authorization.

## 2023-03-24 ENCOUNTER — Telehealth: Payer: Self-pay

## 2023-03-24 NOTE — Telephone Encounter (Signed)
 PA for [facility] submitted through cover may meds.   Awaiting response.  Dm/cma   Key: Malon Kindle

## 2023-03-24 NOTE — Telephone Encounter (Signed)
 Does not need a refill.  She hasn't even started the medication.   I submitted the PA today . Dm/cma

## 2023-03-25 ENCOUNTER — Other Ambulatory Visit (HOSPITAL_COMMUNITY): Payer: Self-pay

## 2023-03-25 NOTE — Telephone Encounter (Signed)
 PA approved from 03/14/23 -03/23/24.   Pharmacy notified VIA phone.  Dm/cma

## 2023-03-26 ENCOUNTER — Ambulatory Visit: Payer: Medicare HMO | Admitting: Nurse Practitioner

## 2023-03-26 ENCOUNTER — Ambulatory Visit (HOSPITAL_COMMUNITY): Payer: Medicare HMO

## 2023-03-30 ENCOUNTER — Encounter: Payer: Self-pay | Admitting: Family Medicine

## 2023-03-30 ENCOUNTER — Other Ambulatory Visit: Payer: Self-pay | Admitting: Family Medicine

## 2023-03-30 ENCOUNTER — Telehealth: Payer: Self-pay

## 2023-03-30 DIAGNOSIS — M170 Bilateral primary osteoarthritis of knee: Secondary | ICD-10-CM

## 2023-03-30 DIAGNOSIS — F411 Generalized anxiety disorder: Secondary | ICD-10-CM

## 2023-03-30 DIAGNOSIS — M160 Bilateral primary osteoarthritis of hip: Secondary | ICD-10-CM

## 2023-03-30 MED ORDER — ALPRAZOLAM 0.25 MG PO TABS: 0.2500 mg | ORAL_TABLET | Freq: Two times a day (BID) | ORAL | 0 refills | Status: DC | PRN

## 2023-03-30 MED ORDER — HYDROCODONE-ACETAMINOPHEN 10-325 MG PO TABS: 1.0000 | ORAL_TABLET | Freq: Every day | ORAL | 0 refills | Status: DC | PRN

## 2023-03-30 NOTE — Telephone Encounter (Signed)
 Copied from CRM [id number]. Topic: General - Other >> Mar 30, 2023  8:23 AM Turkey A wrote: Reason for CRM: Patients daughter called because she is following up regarding PT and OT referral for Home Health. Patient's daughter also wants to know if the Prior Authorization has been sent to patient's insurance-please contact

## 2023-03-30 NOTE — Telephone Encounter (Signed)
 Copied from CRM [id number]. Topic: Clinical - Medication Refill >> Mar 30, 2023  8:21 AM Benetta Spar A wrote: Most Recent Primary Care Visit:  Provider: Loyola Mast  Department: [facility]  Visit Type: OFFICE VISIT  Date: 02/13/2023  Medication: ALPRAZolam (XANAX) 0.25 MG tablet; HYDROcodone-acetaminophen (NORCO) 10-325 MG tablet  Has the patient contacted their pharmacy? No (Agent: If no, request that the patient contact the pharmacy for the refill. If patient does not wish to contact the pharmacy document the reason why and proceed with request.) (Agent: If yes, when and what did the pharmacy advise?)  Is this the correct pharmacy for this prescription? Yes If no, delete pharmacy and type the correct one.  This is the patient's preferred pharmacy:  [facility] #[id number] [place], [place] - [phone] AT [facility] OF [facility] & [address] [place] [address] Phone: [phone] Fax: [phone]  [facility] [address] [place] [address] Phone: [phone] Fax: [phone]   Has the prescription been filled recently? No  Is the patient out of the medication? Yes  Has the patient been seen for an appointment in the last year OR does the patient have an upcoming appointment? Yes  Can we respond through MyChart? Yes  Agent: Please be advised that Rx refills may take up to 3 business days. We ask that you follow-up with your pharmacy.

## 2023-03-30 NOTE — Telephone Encounter (Signed)
Patients daughter notified VIA phone. Dm/cma  

## 2023-03-30 NOTE — Telephone Encounter (Signed)
 Can you help and give advise on the last [facility] order that Dr Veto Kemps placed? Thanks. Dm/cma

## 2023-03-30 NOTE — Telephone Encounter (Signed)
 Spoke to patient's daughter, she has sent me the front of new insurance card but need the back in order for front desk to change insurance in chart.  Dm/cma

## 2023-03-30 NOTE — Telephone Encounter (Signed)
 Refill request for  Alprazolam 0.25 mg LR  02/13/23, #30, 0 rf  Hydrocodone/Acet 10/325 mg LR  02/13/23, #30, 0 rf LOV  02/13/23 FOV  none scheduled.   Please review and advise.  Thanks. Dm/cma

## 2023-03-31 NOTE — Telephone Encounter (Signed)
 Patients daughter is supposed to be sending me a copy of the back of the card so they can change insurance in her chart. Dm/cma

## 2023-04-07 ENCOUNTER — Telehealth: Payer: Self-pay

## 2023-04-07 DIAGNOSIS — M160 Bilateral primary osteoarthritis of hip: Secondary | ICD-10-CM

## 2023-04-07 DIAGNOSIS — M170 Bilateral primary osteoarthritis of knee: Secondary | ICD-10-CM

## 2023-04-07 DIAGNOSIS — Z8673 Personal history of transient ischemic attack (TIA), and cerebral infarction without residual deficits: Secondary | ICD-10-CM

## 2023-04-07 NOTE — Telephone Encounter (Signed)
 Copied from CRM [id number]. Topic: General - Other >> Apr 07, 2023  9:08 AM Elizebeth Brooking wrote: Reason for CRM: Patient daughter called in regarding if another referral for home health services pt and ot has been sent. Would like for it to be sent to [facility] if is available would like for someone to give her a call regarding this

## 2023-04-07 NOTE — Telephone Encounter (Signed)
 Please review message regarding HH order. Dm/cma

## 2023-04-08 NOTE — Telephone Encounter (Signed)
 Can you please place a ne HH order for patient.   Thanks. Dm/cma

## 2023-04-08 NOTE — Addendum Note (Signed)
 Addended by: Loyola Mast on: 04/08/2023 01:10 PM   Modules accepted: Orders

## 2023-04-08 NOTE — Telephone Encounter (Signed)
 Referral placed.  Dm/cma

## 2023-04-09 NOTE — Telephone Encounter (Signed)
 Sent to [facility]

## 2023-04-13 NOTE — Telephone Encounter (Signed)
 Denied by [facility]. Inbasket referral msg sent to [facility] and Provider

## 2023-04-16 ENCOUNTER — Ambulatory Visit: Payer: PRIVATE HEALTH INSURANCE | Admitting: Nurse Practitioner

## 2023-04-16 ENCOUNTER — Ambulatory Visit (HOSPITAL_COMMUNITY)
Admission: RE | Admit: 2023-04-16 | Payer: PRIVATE HEALTH INSURANCE | Source: Ambulatory Visit | Attending: Cardiovascular Disease | Admitting: Cardiovascular Disease

## 2023-04-16 NOTE — Progress Notes (Deleted)
 Office Visit    Patient Name: Jade Ellison Date of Encounter: 04/16/2023  Primary Care Provider:  Loyola Mast, MD Primary Cardiologist:  Nanetta Batty, MD  Chief Complaint    78 year old female with a history of persistent atrial fibrillation, PSVT, hypertension, hyperlipidemia, CVA, Barrett's esophagus, iron deficiency anemia, CKD stage IIIa, type 2 diabetes, and OSA who presents for follow-up related to atrial fibrillation and bilateral lower extremity edema.  Past Medical History    Past Medical History:  Diagnosis Date   Adult stuttering    Anemia    Anxiety    Panic attack- - 1 year ago (cries - doneest know why and gets nervous   Arthritis    Barrett esophagus 10/14/10   CAD (coronary artery disease)    Chronic kidney disease, stage 3a (HCC) 10/13/2016   GFR 47 June 2018   Colon polyp    polypoid colorectal mucosa   Complication of anesthesia    Depression    Diverticulosis    DJD (degenerative joint disease)    left knee   Esophagitis    Gastritis    Glaucoma    Helicobacter pylori (H. pylori)    Hemorrhoids    Hernia of unspecified site of abdominal cavity without mention of obstruction or gangrene    History of blood transfusion    History of kidney stones    Hypertension 09 07 2013   TRANSTHORACIC ECHO STUDY CONCLUSIONS    Hypertension 09 07 2013   EJECTION FRACTION- 55%-60% .WALL MOTION WAS NORMAL   Impaired mobility    Iron deficiency anemia    Morbid obesity (HCC)    Neuropathy    Obesity    Olecranon bursitis of left elbow    OSA (obstructive sleep apnea)    On cpap   Osteoarthritis    right hip   Osteoporosis    Primary osteoarthritis    Bilaterally (knee)   PSVT (paroxysmal supraventricular tachycardia) (HCC)    PSVT (paroxysmal supraventricular tachycardia) (HCC)    Short-term memory loss    Shortness of breath dyspnea    Sleep apnea    on CPAP- does not use machine   Spinal headache 1986   Type 2 diabetes mellitus (HCC)     Past Surgical History:  Procedure Laterality Date   BIOPSY  11/07/2019   Procedure: BIOPSY;  Surgeon: Napoleon Form, MD;  Location: [facility];  Service: Endoscopy;;   CARDIAC CATHETERIZATION  01/13/2001   normal coronary arteries and nomal LV function by cath performed by Dr.Peter Nishan   CESAREAN SECTION  01/14/1984   CHOLECYSTECTOMY     COLONOSCOPY WITH PROPOFOL N/A 11/07/2019   Procedure: COLONOSCOPY WITH PROPOFOL;  Surgeon: Napoleon Form, MD;  Location: [facility];  Service: Endoscopy;  Laterality: N/A;   ENTEROSCOPY  02/05/2012   Procedure: ENTEROSCOPY;  Surgeon: Hart Carwin, MD;  Location: [facility];  Service: Endoscopy;  Laterality: N/A;   ESOPHAGOGASTRODUODENOSCOPY N/A 04/19/2014   Procedure: ESOPHAGOGASTRODUODENOSCOPY (EGD);  Surgeon: Hart Carwin, MD;  Location: [facility];  Service: Endoscopy;  Laterality: N/A;   ESOPHAGOGASTRODUODENOSCOPY (EGD) WITH PROPOFOL N/A 08/01/2015   Procedure: ESOPHAGOGASTRODUODENOSCOPY (EGD) WITH PROPOFOL;  Surgeon: Napoleon Form, MD;  Location: [facility];  Service: Endoscopy;  Laterality: N/A;   ESOPHAGOGASTRODUODENOSCOPY (EGD) WITH PROPOFOL N/A 11/07/2019   Procedure: ESOPHAGOGASTRODUODENOSCOPY (EGD) WITH PROPOFOL;  Surgeon: Napoleon Form, MD;  Location: [facility];  Service: Endoscopy;  Laterality: N/A;   EXTRACORPOREAL SHOCK WAVE LITHOTRIPSY Left  HERNIA REPAIR     HOT HEMOSTASIS  02/05/2012   Procedure: HOT HEMOSTASIS (ARGON PLASMA COAGULATION/BICAP);  Surgeon: Hart Carwin, MD;  Location: [facility];  Service: Endoscopy;  Laterality: N/A;   KNEE ARTHROSCOPY Left    POLYPECTOMY  11/07/2019   Procedure: POLYPECTOMY;  Surgeon: Napoleon Form, MD;  Location: [facility];  Service: Endoscopy;;   TUBAL LIGATION     UMBILICAL HERNIA REPAIR      Allergies  Allergies  Allergen Reactions   Neurontin [Gabapentin] Other (See Comments)    Dizziness   Codeine Rash   Fosamax [Alendronate Sodium]  Other (See Comments)    Upset stomach    Nsaids Other (See Comments)    Abdominal Pain     Labs/Other Studies Reviewed    The following studies were reviewed today: *** Cardiac Studies & Procedures   ______________________________________________________________________________________________     ECHOCARDIOGRAM  ECHOCARDIOGRAM COMPLETE BUBBLE STUDY 10/07/2021  Narrative ECHOCARDIOGRAM REPORT    Patient Name:   Jade Ellison Date of Exam: 10/07/2021 Medical Rec #:  [id number]        Height:       63.0 in Accession #:    [id number]       Weight:       283.0 lb Date of Birth:  [birth year]        BSA:          2.241 m Patient Age:    76 years         BP:           152/94 mmHg Patient Gender: F                HR:           71 bpm. Exam Location:  Inpatient  Procedure: 2D Echo, Cardiac Doppler, Color Doppler and Saline Contrast Bubble Study  Indications:    Stroke  History:        Patient has prior history of Echocardiogram examinations, most recent 09/14/2011. CHF; Risk Factors:Hypertension, Diabetes, Sleep Apnea and Dyslipidemia.  Sonographer:    Milda Smart Referring Phys: [id number] Marinda Elk   Sonographer Comments: Technically difficult study due to poor echo windows. Negative bubble study IMPRESSIONS   1. Left ventricular ejection fraction, by estimation, is 55 to 60%. The left ventricle has normal function. The left ventricle has no regional wall motion abnormalities. Left ventricular diastolic function could not be evaluated. 2. Right ventricular systolic function is mildly reduced. The right ventricular size is normal. 3. The mitral valve is abnormal. Trivial mitral valve regurgitation. 4. The aortic valve was not well visualized. Aortic valve regurgitation is not visualized. 5. The inferior vena cava is normal in size with greater than 50% respiratory variability, suggesting right atrial pressure of 3 mmHg. 6. Agitated saline contrast bubble study was  negative, with no evidence of any interatrial shunt.  Comparison(s): Changes from prior study are noted. 09/14/2011: LVEF 55-60%, negative bubble study.  FINDINGS Left Ventricle: Left ventricular ejection fraction, by estimation, is 55 to 60%. The left ventricle has normal function. The left ventricle has no regional wall motion abnormalities. The left ventricular internal cavity size was normal in size. There is no left ventricular hypertrophy. Left ventricular diastolic function could not be evaluated due to atrial fibrillation. Left ventricular diastolic function could not be evaluated.  Right Ventricle: The right ventricular size is normal. No increase in right ventricular wall thickness. Right ventricular systolic function is mildly reduced.  Left Atrium: Left  atrial size was normal in size.  Right Atrium: Right atrial size was normal in size.  Pericardium: There is no evidence of pericardial effusion.  Mitral Valve: The mitral valve is abnormal. Mild to moderate mitral annular calcification. Trivial mitral valve regurgitation. MV peak gradient, 6.6 mmHg. The mean mitral valve gradient is 3.0 mmHg.  Tricuspid Valve: The tricuspid valve is not well visualized. Tricuspid valve regurgitation is not demonstrated.  Aortic Valve: The aortic valve was not well visualized. Aortic valve regurgitation is not visualized.  Pulmonic Valve: The pulmonic valve was not well visualized. Pulmonic valve regurgitation is not visualized.  Aorta: The aortic root and ascending aorta are structurally normal, with no evidence of dilitation.  Venous: The inferior vena cava is normal in size with greater than 50% respiratory variability, suggesting right atrial pressure of 3 mmHg.  IAS/Shunts: No atrial level shunt detected by color flow Doppler. Agitated saline contrast was given intravenously to evaluate for intracardiac shunting. Agitated saline contrast bubble study was negative, with no evidence of any  interatrial shunt.   LEFT VENTRICLE PLAX 2D LVIDd:         4.80 cm   Diastology LVIDs:         3.70 cm   LV e' medial:    5.40 cm/s LV PW:         1.00 cm   LV E/e' medial:  23.1 LV IVS:        0.80 cm   LV e' lateral:   6.30 cm/s LVOT diam:     1.70 cm   LV E/e' lateral: 19.8 LVOT Area:     2.27 cm   RIGHT VENTRICLE            IVC RV S prime:     7.80 cm/s  IVC diam: 1.80 cm  LEFT ATRIUM             Index        RIGHT ATRIUM           Index LA diam:        3.80 cm 1.70 cm/m   RA Area:     15.10 cm LA Vol (A2C):   46.3 ml 20.66 ml/m  RA Volume:   31.90 ml  14.23 ml/m LA Vol (A4C):   61.9 ml 27.62 ml/m LA Biplane Vol: 56.8 ml 25.35 ml/m  AORTA Ao Root diam: 2.40 cm  MITRAL VALVE MV Area (PHT): 3.48 cm     SHUNTS MV Peak grad:  6.6 mmHg     Systemic Diam: 1.70 cm MV Mean grad:  3.0 mmHg MV Vmax:       1.28 m/s MV Vmean:      79.7 cm/s MV Decel Time: 218 msec MV E velocity: 125.00 cm/s  Zoila Shutter MD Electronically signed by Zoila Shutter MD Signature Date/Time: 10/07/2021/11:47:07 AM    Final          ______________________________________________________________________________________________     Recent Labs: 07/02/2022: Hemoglobin 10.9; Platelets 221.0 10/08/2022: TSH 1.98 02/13/2023: ALT 9; BUN 20; Creatinine, Ser 0.99; Potassium 4.3; Pro B Natriuretic peptide (BNP) 507.0; Sodium 133  Recent Lipid Panel    Component Value Date/Time   CHOL 103 07/02/2022 1547   TRIG 92.0 07/02/2022 1547   HDL 45.20 07/02/2022 1547   CHOLHDL 2 07/02/2022 1547   VLDL 18.4 07/02/2022 1547   LDLCALC 39 07/02/2022 1547    History of Present Illness   78 year old female with the above past medical history including persistent  atrial fibrillation, PSVT, hypertension, hyperlipidemia, CVA, Barrett's esophagus, iron deficiency anemia, CKD stage IIIa, type 2 diabetes, and OSA.  Cardiac catheterization in 2003 revealed normal coronary arteries.  Echocardiogram in 09/2021  showed EF 55 to 60%, normal V function, no RWMA, mildly reduced RV systolic function, no significant valvular abnormalities.  She was last in the office on 02/17/2023 and was stable overall from a cardiac standpoint.  She noted increased bilateral lower extremity edema, right greater than left.  BNP was elevated, she was treated with oral Lasix with some improvement in her swelling. Of note, she is wheelchair-bound following CVA.  Repeat echocardiogram was ordered and is pending.  She presents today for follow-up.  Since her last visit  Bilateral lower extremity edema:  Persistent atrial fibrillation: Hypertension: Hyperlipidemia: History of CVA: CKD stage III: Type 2 diabetes: OSA: Disposition:  Home Medications    Current Outpatient Medications  Medication Sig Dispense Refill   acetaminophen (TYLENOL) 325 MG tablet Take 650 mg by mouth every 6 (six) hours as needed for moderate pain or headache.     ALPRAZolam (XANAX) 0.25 MG tablet Take 1 tablet (0.25 mg total) by mouth 2 (two) times daily as needed for anxiety. 30 tablet 0   apixaban (ELIQUIS) 5 MG TABS tablet Take 1 tablet (5 mg total) by mouth 2 (two) times daily. 60 tablet 5   atorvastatin (LIPITOR) 20 MG tablet Take 20 mg by mouth daily.     desonide (DESOWEN) 0.05 % cream Apply 1 Application topically 2 (two) times daily as needed (dry skin/irritation). 30 g 1   dicyclomine (BENTYL) 20 MG tablet TAKE 1 TABLET(20 MG) BY MOUTH THREE TIMES DAILY AFTER MEALS (Patient taking differently: Take 20 mg by mouth 2 (two) times daily.) 90 tablet 0   DULoxetine (CYMBALTA) 60 MG capsule Take 1 capsule (60 mg total) by mouth daily. 90 capsule 3   furosemide (LASIX) 20 MG tablet Take 1 tablet (20 mg total) by mouth daily. 90 tablet 1   glipiZIDE (GLUCOTROL XL) 5 MG 24 hr tablet Take 5 mg by mouth daily.     HYDROcodone-acetaminophen (NORCO) 10-325 MG tablet Take 1 tablet by mouth daily as needed for severe pain (pain score 7-10). 12 tablet 0    latanoprost (XALATAN) 0.005 % ophthalmic solution Place 1 drop into both eyes at bedtime.      metFORMIN (GLUCOPHAGE) 1000 MG tablet Take 1,000 mg by mouth 2 (two) times daily.     metoprolol tartrate (LOPRESSOR) 50 MG tablet Take 1 tablet (50 mg total) by mouth 2 (two) times daily. 180 tablet 2   Mouthwashes (BIOTENE/CALCIUM MT) Take 1 Dose by mouth daily as needed (dry mouth).      Multiple Vitamin (MULTIVITAMIN WITH MINERALS) TABS tablet Take 1 tablet by mouth daily.     omeprazole (PRILOSEC) 40 MG capsule TAKE 1 CAPSULE(40 MG) BY MOUTH DAILY 90 capsule 0   pregabalin (LYRICA) 50 MG capsule Take 1 capsule (50 mg total) by mouth 2 (two) times daily. 180 capsule 3   psyllium (METAMUCIL SMOOTH TEXTURE) 28 % packet Take 1 packet by mouth daily as needed (constipation).      saccharomyces boulardii (FLORASTOR) 250 MG capsule Take 1 capsule (250 mg total) by mouth daily. 30 capsule 0   tirzepatide (MOUNJARO) 2.5 MG/0.5ML Pen Inject 2.5 mg into the skin once a week. 2 mL 0   tirzepatide (MOUNJARO) 5 MG/0.5ML Pen Inject 5 mg into the skin once a week. (Patient not taking: Reported on 02/17/2023)  6 mL 3   No current facility-administered medications for this visit.     Review of Systems    ***.  All other systems reviewed and are otherwise negative except as noted above.    Physical Exam    VS:  There were no vitals taken for this visit. , BMI There is no height or weight on file to calculate BMI.     GEN: Well nourished, well developed, in no acute distress. HEENT: normal. Neck: Supple, no JVD, carotid bruits, or masses. Cardiac: RRR, no murmurs, rubs, or gallops. No clubbing, cyanosis, edema.  Radials/DP/PT 2+ and equal bilaterally.  Respiratory:  Respirations regular and unlabored, clear to auscultation bilaterally. GI: Soft, nontender, nondistended, BS + x 4. MS: no deformity or atrophy. Skin: warm and dry, no rash. Neuro:  Strength and sensation are intact. Psych: Normal  affect.  Accessory Clinical Findings    ECG personally reviewed by me today -    - no acute changes.   Lab Results  Component Value Date   WBC 6.8 07/02/2022   HGB 10.9 (L) 07/02/2022   HCT 35.1 (L) 07/02/2022   MCV 78.8 07/02/2022   PLT 221.0 07/02/2022   Lab Results  Component Value Date   CREATININE 0.99 02/13/2023   BUN 20 02/13/2023   NA 133 (L) 02/13/2023   K 4.3 02/13/2023   CL 95 (L) 02/13/2023   CO2 22 02/13/2023   Lab Results  Component Value Date   ALT 9 02/13/2023   AST 18 02/13/2023   ALKPHOS 83 02/13/2023   BILITOT 0.6 02/13/2023   Lab Results  Component Value Date   CHOL 103 07/02/2022   HDL 45.20 07/02/2022   LDLCALC 39 07/02/2022   TRIG 92.0 07/02/2022   CHOLHDL 2 07/02/2022    Lab Results  Component Value Date   HGBA1C 8.1 (H) 02/13/2023    Assessment & Plan    1.  ***  No BP recorded.  {Refresh Note OR Click here to enter BP  :1}***   Joylene Grapes, NP 04/16/2023, 9:46 AM

## 2023-04-18 ENCOUNTER — Other Ambulatory Visit: Payer: Self-pay | Admitting: Family Medicine

## 2023-04-18 DIAGNOSIS — K219 Gastro-esophageal reflux disease without esophagitis: Secondary | ICD-10-CM

## 2023-04-18 DIAGNOSIS — E1142 Type 2 diabetes mellitus with diabetic polyneuropathy: Secondary | ICD-10-CM

## 2023-04-21 ENCOUNTER — Ambulatory Visit: Admitting: Nurse Practitioner

## 2023-04-22 ENCOUNTER — Telehealth: Payer: Self-pay

## 2023-04-22 ENCOUNTER — Other Ambulatory Visit (HOSPITAL_COMMUNITY): Payer: Self-pay

## 2023-04-22 NOTE — Telephone Encounter (Signed)
 Pharmacy Patient Advocate Encounter  Received notification from [facility] that Prior Authorization for HYDROcodone-Acetaminophen 10-325MG  tablets has been APPROVED from 03/14/2023 to 04/21/2024. Ran test claim, Copay is $0.00. This test claim was processed through [facility]- copay amounts may vary at other pharmacies due to pharmacy/plan contracts, or as the patient moves through the different stages of their insurance plan.   PA #/Case ID/Reference #: [id number]

## 2023-04-22 NOTE — Telephone Encounter (Signed)
 Pharmacy Patient Advocate Encounter   Received notification from CoverMyMeds that prior authorization for HYDROcodone-Acetaminophen 10-325MG  tablets is required/requested.   Insurance verification completed.   The patient is insured through [employer] .   Per test claim: PA required; PA submitted to above mentioned insurance via CoverMyMeds Key/confirmation #/EOC [id number] Status is pending

## 2023-04-27 ENCOUNTER — Other Ambulatory Visit: Payer: Self-pay | Admitting: Family Medicine

## 2023-04-27 MED ORDER — GLIPIZIDE ER 5 MG PO TB24: 5.0000 mg | ORAL_TABLET | Freq: Every day | ORAL | 3 refills | Status: DC

## 2023-04-27 MED ORDER — ATORVASTATIN CALCIUM 20 MG PO TABS: 20.0000 mg | ORAL_TABLET | Freq: Every day | ORAL | 3 refills | Status: AC

## 2023-04-27 NOTE — Telephone Encounter (Signed)
 Copied from CRM [id number]. Topic: Clinical - Medication Refill >> Apr 27, 2023  8:03 AM Allison Arena wrote: Most Recent Primary Care Visit:  Provider: Graig Lawyer  Department: [facility]  Visit Type: OFFICE VISIT  Date: 02/13/2023  Medication: atorvastatin (LIPITOR) 20 MG tablet glipiZIDE (GLUCOTROL XL) 5 MG 24 hr tablet  Has the patient contacted their pharmacy? Yes, advised to contact PCP. (Agent: If no, request that the patient contact the pharmacy for the refill. If patient does not wish to contact the pharmacy document the reason why and proceed with request.) (Agent: If yes, when and what did the pharmacy advise?)  Is this the correct pharmacy for this prescription? Yes If no, delete pharmacy and type the correct one.  This is the patient's preferred pharmacy:  [id number] [place], [place] - [phone] AT [facility] OF [facility] & [address] [place] [address] Phone: [phone] Fax: [phone]   Has the prescription been filled recently? No  Is the patient out of the medication? Yes  Has the patient been seen for an appointment in the last year OR does the patient have an upcoming appointment? Yes  Can we respond through MyChart? Yes  Agent: Please be advised that Rx refills may take up to 3 business days. We ask that you follow-up with your pharmacy.

## 2023-04-28 ENCOUNTER — Telehealth: Payer: Self-pay

## 2023-04-28 NOTE — Telephone Encounter (Signed)
 Forwarding message below. It seems as if home health for [facility] isn't in network with Pt insurance. Please see referral message from 04/08/2023.

## 2023-04-28 NOTE — Telephone Encounter (Signed)
 Copied from CRM [id number]. Topic: Referral - Status >> Apr 10, 2023 12:36 PM Marlan Silva wrote: Reason for CRM: Patients daughter called on behalf of the patient to inquire if the referral had been sent to [facility]. After looking through the chart notes, I saw that the CMA sent the referral over to [facility] on 04/09/2023. Patients daughter understood. >> Apr 28, 2023 12:37 PM [place] J wrote: Patient's daughter calling in regarding [facility] referral that was sent in for the patient to get physical therapy. [facility] stated that they do not recognize patient's insurance as one they are in contract with so she was wondering if there is another place where the patient could receive at home physical therapy  Patient's insurance: [employer] PPO

## 2023-04-30 NOTE — Telephone Encounter (Signed)
 Patient's daughter notified VIA phone.  She will get back to us . Dm/cma

## 2023-05-01 ENCOUNTER — Encounter: Payer: Self-pay | Admitting: Family Medicine

## 2023-05-04 NOTE — Telephone Encounter (Signed)
 Message sent to [facility] to get the referral started for patient. Dm/cma

## 2023-05-04 NOTE — Telephone Encounter (Signed)
 Forwarding message below

## 2023-05-04 NOTE — Telephone Encounter (Signed)
 Copied from CRM [id number]. Topic: Clinical - Medication Question >> May 01, 2023  1:56 PM Jade Ellison wrote: Reason for CRM: pt's daughter to advise of the home health agencys that participate with her insurance. Requesting a prior auth for one of the agency's  [facility] please call her back at [phone]

## 2023-05-06 NOTE — Telephone Encounter (Signed)
 Patient notified VIA phone. Dm/cma

## 2023-05-07 NOTE — Telephone Encounter (Signed)
 Message sent to patients daughter to schedule an appointment. Dm/cma

## 2023-05-20 ENCOUNTER — Other Ambulatory Visit (HOSPITAL_COMMUNITY): Payer: PRIVATE HEALTH INSURANCE

## 2023-05-21 ENCOUNTER — Ambulatory Visit: Payer: PRIVATE HEALTH INSURANCE | Admitting: Nurse Practitioner

## 2023-05-21 ENCOUNTER — Encounter: Payer: Self-pay | Admitting: Nurse Practitioner

## 2023-05-21 ENCOUNTER — Ambulatory Visit (HOSPITAL_COMMUNITY): Payer: PRIVATE HEALTH INSURANCE | Attending: Cardiology

## 2023-05-21 ENCOUNTER — Telehealth: Payer: Self-pay

## 2023-05-21 VITALS — BP 134/80 | HR 98 | Ht 63.0 in | Wt 260.0 lb

## 2023-05-21 DIAGNOSIS — I2510 Atherosclerotic heart disease of native coronary artery without angina pectoris: Secondary | ICD-10-CM | POA: Diagnosis not present

## 2023-05-21 DIAGNOSIS — E1122 Type 2 diabetes mellitus with diabetic chronic kidney disease: Secondary | ICD-10-CM | POA: Insufficient documentation

## 2023-05-21 DIAGNOSIS — R600 Localized edema: Secondary | ICD-10-CM | POA: Insufficient documentation

## 2023-05-21 DIAGNOSIS — I10 Essential (primary) hypertension: Secondary | ICD-10-CM

## 2023-05-21 DIAGNOSIS — N1831 Chronic kidney disease, stage 3a: Secondary | ICD-10-CM | POA: Diagnosis present

## 2023-05-21 DIAGNOSIS — E785 Hyperlipidemia, unspecified: Secondary | ICD-10-CM

## 2023-05-21 DIAGNOSIS — I4819 Other persistent atrial fibrillation: Secondary | ICD-10-CM

## 2023-05-21 DIAGNOSIS — Z8673 Personal history of transient ischemic attack (TIA), and cerebral infarction without residual deficits: Secondary | ICD-10-CM

## 2023-05-21 LAB — ECHOCARDIOGRAM COMPLETE
Area-P 1/2: 4.23 cm2
S' Lateral: 2.85 cm

## 2023-05-21 MED ORDER — FUROSEMIDE 40 MG PO TABS: 40.0000 mg | ORAL_TABLET | Freq: Every day | ORAL | 3 refills | Status: AC

## 2023-05-21 NOTE — Telephone Encounter (Signed)
 Copied from CRM [id number]. Topic: Referral - Question >> May 21, 2023 11:49 AM Dimple Francis wrote: Reason for CRM: Patient daughter called upset, stating that she does not want to bring her mother in for a face to face appointment for the referrals for home health. She is just asking for the referrals to be sent, it is hard for her mother to walk. Please contact her daughter as soon as possible [name]- [phone]

## 2023-05-21 NOTE — Telephone Encounter (Signed)
 Called patients daughter back and advised that the Home Health Agenct\y is asking for a face 2 face.  She scheduled an appointment for 06/19/23 @ 3:40 pm.   Dm/cma

## 2023-05-21 NOTE — Patient Instructions (Addendum)
 Medication Instructions:  Increase Lasix  40 mg daily  *If you need a refill on your cardiac medications before your next appointment, please call your pharmacy*  Lab Work: CMET, BNP, A1C, CBC, Lipid today BMET in 2 weeks  Testing/Procedures: NONE ordered at this time of appointment    Follow-Up: At [facility], you and your health needs are our priority.  As part of our continuing mission to provide you with exceptional heart care, our providers are all part of one team.  This team includes your primary Cardiologist (physician) and Advanced Practice Providers or APPs (Physician Assistants and Nurse Practitioners) who all work together to provide you with the care you need, when you need it.  Your next appointment:   08/2023   Provider:   Lauro Portal, MD    We recommend signing up for the patient portal called "MyChart".  Sign up information is provided on this After Visit Summary.  MyChart is used to connect with patients for Virtual Visits (Telemedicine).  Patients are able to view lab/test results, encounter notes, upcoming appointments, etc.  Non-urgent messages can be sent to your provider as well.   To learn more about what you can do with MyChart, go to [email or web address].

## 2023-05-21 NOTE — Progress Notes (Signed)
 Office Visit    Patient Name: Jade Ellison Date of Encounter: 05/21/2023  Primary Care Provider:  Graig Lawyer, MD Primary Cardiologist:  Lauro Portal, MD  Chief Complaint    78 year old female with a history of persistent atrial fibrillation, PSVT, hypertension, hyperlipidemia, CVA, Barrett's esophagus, iron  deficiency anemia, CKD stage IIIa, type 2 diabetes, and OSA who presents for follow-up related to hypertension and lower extremity edema.   Past Medical History    Past Medical History:  Diagnosis Date   Adult stuttering    Anemia    Anxiety    Panic attack- - 1 year ago (cries - doneest know why and gets nervous   Arthritis    Barrett esophagus 10/14/10   CAD (coronary artery disease)    Chronic kidney disease, stage 3a (HCC) 10/13/2016   GFR 47 June 2018   Colon polyp    polypoid colorectal mucosa   Complication of anesthesia    Depression    Diverticulosis    DJD (degenerative joint disease)    left knee   Esophagitis    Gastritis    Glaucoma    Helicobacter pylori (H. pylori)    Hemorrhoids    Hernia of unspecified site of abdominal cavity without mention of obstruction or gangrene    History of blood transfusion    History of kidney stones    Hypertension 09 07 2013   TRANSTHORACIC ECHO STUDY CONCLUSIONS    Hypertension 09 07 2013   EJECTION FRACTION- 55%-60% .WALL MOTION WAS NORMAL   Impaired mobility    Iron  deficiency anemia    Morbid obesity (HCC)    Neuropathy    Obesity    Olecranon bursitis of left elbow    OSA (obstructive sleep apnea)    On cpap   Osteoarthritis    right hip   Osteoporosis    Primary osteoarthritis    Bilaterally (knee)   PSVT (paroxysmal supraventricular tachycardia) (HCC)    PSVT (paroxysmal supraventricular tachycardia) (HCC)    Short-term memory loss    Shortness of breath dyspnea    Sleep apnea    on CPAP- does not use machine   Spinal headache 1986   Type 2 diabetes mellitus (HCC)    Past Surgical  History:  Procedure Laterality Date   BIOPSY  11/07/2019   Procedure: BIOPSY;  Surgeon: Sergio Dandy, MD;  Location: [facility];  Service: Endoscopy;;   CARDIAC CATHETERIZATION  01/13/2001   normal coronary arteries and nomal LV function by cath performed by Dr.Peter Nishan   CESAREAN SECTION  01/14/1984   CHOLECYSTECTOMY     COLONOSCOPY WITH PROPOFOL  N/A 11/07/2019   Procedure: COLONOSCOPY WITH PROPOFOL ;  Surgeon: Sergio Dandy, MD;  Location: [facility];  Service: Endoscopy;  Laterality: N/A;   ENTEROSCOPY  02/05/2012   Procedure: ENTEROSCOPY;  Surgeon: Pietro Bridegroom, MD;  Location: [facility];  Service: Endoscopy;  Laterality: N/A;   ESOPHAGOGASTRODUODENOSCOPY N/A 04/19/2014   Procedure: ESOPHAGOGASTRODUODENOSCOPY (EGD);  Surgeon: Pietro Bridegroom, MD;  Location: [facility];  Service: Endoscopy;  Laterality: N/A;   ESOPHAGOGASTRODUODENOSCOPY (EGD) WITH PROPOFOL  N/A 08/01/2015   Procedure: ESOPHAGOGASTRODUODENOSCOPY (EGD) WITH PROPOFOL ;  Surgeon: Sergio Dandy, MD;  Location: [facility];  Service: Endoscopy;  Laterality: N/A;   ESOPHAGOGASTRODUODENOSCOPY (EGD) WITH PROPOFOL  N/A 11/07/2019   Procedure: ESOPHAGOGASTRODUODENOSCOPY (EGD) WITH PROPOFOL ;  Surgeon: Sergio Dandy, MD;  Location: [facility];  Service: Endoscopy;  Laterality: N/A;   EXTRACORPOREAL SHOCK WAVE LITHOTRIPSY Left    HERNIA  REPAIR     HOT HEMOSTASIS  02/05/2012   Procedure: HOT HEMOSTASIS (ARGON PLASMA COAGULATION/BICAP);  Surgeon: Pietro Bridegroom, MD;  Location: [facility];  Service: Endoscopy;  Laterality: N/A;   KNEE ARTHROSCOPY Left    POLYPECTOMY  11/07/2019   Procedure: POLYPECTOMY;  Surgeon: Sergio Dandy, MD;  Location: [facility];  Service: Endoscopy;;   TUBAL LIGATION     UMBILICAL HERNIA REPAIR      Allergies  Allergies  Allergen Reactions   Neurontin [Gabapentin] Other (See Comments)    Dizziness   Codeine Rash   Fosamax [Alendronate Sodium] Other (See  Comments)    Upset stomach    Nsaids Other (See Comments)    Abdominal Pain     Labs/Other Studies Reviewed    The following studies were reviewed today:  Cardiac Studies & Procedures   ______________________________________________________________________________________________     ECHOCARDIOGRAM  ECHOCARDIOGRAM COMPLETE 05/21/2023  Narrative ECHOCARDIOGRAM REPORT    Patient Name:   Jade Ellison Date of Exam: 05/21/2023 Medical Rec #:  [id number]        Height:       63.0 in Accession #:    [id number]       Weight:       262.0 lb Date of Birth:  09/07/1945        BSA:          2.169 m Patient Age:    78 years         BP:           110/64 mmHg Patient Gender: F                HR:           91 bpm. Exam Location:  [address]  Procedure: 2D Echo, 3D Echo, Cardiac Doppler and Color Doppler (Both Spectral and Color Flow Doppler were utilized during procedure).  Indications:    I25.10 CAD  History:        Patient has prior history of Echocardiogram examinations, most recent 10/07/2021. CAD, Signs/Symptoms:Shortness of Breath; Risk Factors:Sleep Apnea, Hypertension, Diabetes, Dyslipidemia and Family History of Coronary Artery Disease. Morbid Obesity with Limited Mobility.  Sonographer:    Ewing Holiday RDCS Referring Phys: JONATHAN J BERRY  IMPRESSIONS   1. Left ventricular ejection fraction, by estimation, is 55 to 60%. The left ventricle has normal function. The left ventricle has no regional wall motion abnormalities. There is mild left ventricular hypertrophy. Left ventricular diastolic parameters are indeterminate. 2. Right ventricular systolic function was not well visualized. The right ventricular size is not well visualized. There is mildly elevated pulmonary artery systolic pressure. The estimated right ventricular systolic pressure is 36.9 mmHg. 3. The mitral valve is normal in structure. Trivial mitral valve regurgitation. No evidence of mitral  stenosis. 4. The aortic valve was not well visualized. Aortic valve regurgitation is not visualized. No aortic stenosis is present. 5. The inferior vena cava is normal in size with greater than 50% respiratory variability, suggesting right atrial pressure of 3 mmHg.  FINDINGS Left Ventricle: Left ventricular ejection fraction, by estimation, is 55 to 60%. The left ventricle has normal function. The left ventricle has no regional wall motion abnormalities. 3D ejection fraction reviewed and evaluated as part of the interpretation. Alternate measurement of EF is felt to be most reflective of LV function. The left ventricular internal cavity size was normal in size. There is mild left ventricular hypertrophy. Left ventricular diastolic parameters are indeterminate.  Right Ventricle:  The right ventricular size is not well visualized. Right vetricular wall thickness was not well visualized. Right ventricular systolic function was not well visualized. There is mildly elevated pulmonary artery systolic pressure. The tricuspid regurgitant velocity is 2.91 m/s, and with an assumed right atrial pressure of 3 mmHg, the estimated right ventricular systolic pressure is 36.9 mmHg.  Left Atrium: Left atrial size was normal in size.  Right Atrium: Right atrial size was normal in size.  Pericardium: There is no evidence of pericardial effusion.  Mitral Valve: The mitral valve is normal in structure. Trivial mitral valve regurgitation. No evidence of mitral valve stenosis.  Tricuspid Valve: The tricuspid valve is normal in structure. Tricuspid valve regurgitation is mild.  Aortic Valve: The aortic valve was not well visualized. Aortic valve regurgitation is not visualized. No aortic stenosis is present.  Pulmonic Valve: The pulmonic valve was not well visualized. Pulmonic valve regurgitation is not visualized.  Aorta: The aortic root and ascending aorta are structurally normal, with no evidence of  dilitation.  Venous: The inferior vena cava is normal in size with greater than 50% respiratory variability, suggesting right atrial pressure of 3 mmHg.  IAS/Shunts: The interatrial septum was not well visualized.   LEFT VENTRICLE PLAX 2D LVIDd:         3.80 cm   Diastology LVIDs:         2.85 cm   LV e' medial:    6.42 cm/s LV PW:         1.20 cm   LV E/e' medial:  16.0 LV IVS:        1.20 cm   LV e' lateral:   8.05 cm/s LVOT diam:     2.10 cm   LV E/e' lateral: 12.8 LV SV:         37 LV SV Index:   17 LVOT Area:     3.46 cm  3D Volume EF: 3D EF:        41 % LV EDV:       122 ml LV ESV:       72 ml LV SV:        50 ml  RIGHT VENTRICLE RV Basal diam:  3.50 cm RV S prime:     9.02 cm/s TAPSE (M-mode): 1.0 cm RVSP:           36.9 mmHg  LEFT ATRIUM             Index        RIGHT ATRIUM           Index LA diam:        3.90 cm 1.80 cm/m   RA Pressure: 3.00 mmHg LA Vol (A2C):   88.4 ml 40.76 ml/m  RA Area:     17.80 cm LA Vol (A4C):   44.8 ml 20.63 ml/m  RA Volume:   46.30 ml  21.35 ml/m LA Biplane Vol: 58.0 ml 26.74 ml/m AORTIC VALVE LVOT Vmax:   60.78 cm/s LVOT Vmean:  40.425 cm/s LVOT VTI:    0.106 m  AORTA Ao Root diam: 3.20 cm Ao Asc diam:  2.90 cm  MITRAL VALVE                TRICUSPID VALVE MV Area (PHT)  cm          TR Peak grad:   33.9 mmHg MV Decel Time: 180 msec     TR Vmax:        291.00  cm/s MV E velocity: 103.00 cm/s  Estimated RAP:  3.00 mmHg RVSP:           36.9 mmHg  SHUNTS Systemic VTI:  0.11 m Systemic Diam: 2.10 cm  Carson Clara MD Electronically signed by Carson Clara MD Signature Date/Time: 05/21/2023/5:54:46 PM    Final          ______________________________________________________________________________________________     Recent Labs: 07/02/2022: Hemoglobin 10.9; Platelets 221.0 10/08/2022: TSH 1.98 02/13/2023: ALT 9; BUN 20; Creatinine, Ser 0.99; Potassium 4.3; Pro B Natriuretic peptide (BNP) 507.0;  Sodium 133  Recent Lipid Panel    Component Value Date/Time   CHOL 103 07/02/2022 1547   TRIG 92.0 07/02/2022 1547   HDL 45.20 07/02/2022 1547   CHOLHDL 2 07/02/2022 1547   VLDL 18.4 07/02/2022 1547   LDLCALC 39 07/02/2022 1547    History of Present Illness    78 year old female with the above past medical history including persistent atrial fibrillation, PSVT, hypertension, hyperlipidemia, CVA, Barrett's esophagus, iron  deficiency anemia, CKD stage IIIa, type 2 diabetes, and OSA.   Cardiac catheterization in 2003 revealed normal coronary arteries.  Echocardiogram in 09/2021 showed EF 55 to 60%, normal V function, no RWMA, mildly reduced RV systolic function, no significant valvular abnormalities.  She was last in the office on 02/17/2023 and was stable overall from a cardiac standpoint.  She noted increased bilateral lower extremity edema, right greater than left.  BNP was elevated, she was treated with oral Lasix  with some improvement in her swelling.  She was advised to hold her losartan -HCTZ in the setting of borderline low BP.  Of note, she is wheelchair-bound following CVA.  Repeat echocardiogram was ordered and is pending.   She presents today for follow-up accompanied by her daughter.  Since her last visit she has been stable from a cardiac standpoint.  She continues to note ongoing bilateral lower extremity edema, right greater than left, though there does appear to be some dependent component to this.  She denies any chest pain, dyspnea, palpitations, dizziness, PND, orthopnea.  Overall, her symptoms have been stable.  Home Medications    Current Outpatient Medications  Medication Sig Dispense Refill   acetaminophen  (TYLENOL ) 325 MG tablet Take 650 mg by mouth every 6 (six) hours as needed for moderate pain or headache.     ALPRAZolam  (XANAX ) 0.25 MG tablet Take 1 tablet (0.25 mg total) by mouth 2 (two) times daily as needed for anxiety. 30 tablet 0   apixaban  (ELIQUIS ) 5 MG TABS  tablet Take 1 tablet (5 mg total) by mouth 2 (two) times daily. 60 tablet 5   atorvastatin  (LIPITOR) 20 MG tablet Take 1 tablet (20 mg total) by mouth daily. 90 tablet 3   desonide  (DESOWEN ) 0.05 % cream Apply 1 Application topically 2 (two) times daily as needed (dry skin/irritation). 30 g 1   dicyclomine  (BENTYL ) 20 MG tablet TAKE 1 TABLET(20 MG) BY MOUTH THREE TIMES DAILY AFTER MEALS (Patient taking differently: Take 20 mg by mouth 2 (two) times daily.) 90 tablet 0   DULoxetine  (CYMBALTA ) 60 MG capsule Take 1 capsule (60 mg total) by mouth daily. 90 capsule 3   furosemide  (LASIX ) 40 MG tablet Take 1 tablet (40 mg total) by mouth daily. 90 tablet 3   glipiZIDE  (GLUCOTROL  XL) 5 MG 24 hr tablet Take 1 tablet (5 mg total) by mouth daily. 90 tablet 3   HYDROcodone -acetaminophen  (NORCO) 10-325 MG tablet Take 1 tablet by mouth daily as needed for severe pain (pain  score 7-10). 12 tablet 0   latanoprost  (XALATAN ) 0.005 % ophthalmic solution Place 1 drop into both eyes at bedtime.      metFORMIN (GLUCOPHAGE) 1000 MG tablet Take 1,000 mg by mouth 2 (two) times daily.     metoprolol  tartrate (LOPRESSOR ) 50 MG tablet Take 1 tablet (50 mg total) by mouth 2 (two) times daily. 180 tablet 2   Mouthwashes (BIOTENE/CALCIUM  MT) Take 1 Dose by mouth daily as needed (dry mouth).      Multiple Vitamin (MULTIVITAMIN WITH MINERALS) TABS tablet Take 1 tablet by mouth daily.     omeprazole  (PRILOSEC) 40 MG capsule TAKE 1 CAPSULE(40 MG) BY MOUTH DAILY 90 capsule 3   pregabalin  (LYRICA ) 50 MG capsule TAKE 1 CAPSULE(50 MG) BY MOUTH TWICE DAILY 180 capsule 3   psyllium (METAMUCIL SMOOTH TEXTURE) 28 % packet Take 1 packet by mouth daily as needed (constipation).      tirzepatide (MOUNJARO) 5 MG/0.5ML Pen Inject 5 mg into the skin once a week. 6 mL 3   No current facility-administered medications for this visit.     Review of Systems    She denies chest pain, palpitations, dyspnea, pnd, orthopnea, n, v, dizziness,  syncope, weight gain, or early satiety. All other systems reviewed and are otherwise negative except as noted above.   Physical Exam    VS:  BP 134/80   Pulse 98   Ht 5\' 3"  (1.6 m)   Wt 260 lb (117.9 kg)   SpO2 93%   BMI 46.06 kg/m   GEN: Well nourished, well developed, in no acute distress. HEENT: normal. Neck: Supple, no JVD, carotid bruits, or masses. Cardiac: IRIR, no murmurs, rubs, or gallops. No clubbing, cyanosis, nonpitting bilateral lower extremity edema, right greater than left.  Radials/DP/PT 2+ and equal bilaterally.  Respiratory:  Respirations regular and unlabored, clear to auscultation bilaterally. GI: Soft, nontender, nondistended, BS + x 4. MS: no deformity or atrophy. Skin: warm and dry, no rash. Neuro:  Strength and sensation are intact. Psych: Normal affect.  Accessory Clinical Findings    ECG personally reviewed by me today - EKG Interpretation Date/Time:  Thursday May 21 2023 14:34:13 EDT Ventricular Rate:  98 PR Interval:    QRS Duration:  80 QT Interval:  354 QTC Calculation: 451 R Axis:   12  Text Interpretation: Atrial fibrillation Nonspecific ST and T wave abnormality When compared with ECG of 17-Feb-2023 10:40, Criteria for Anterior infarct are no longer Present Confirmed by Marlana Silvan ([id number]) on 05/21/2023 2:37:40 PM  - no acute changes.   Lab Results  Component Value Date   WBC 6.8 07/02/2022   HGB 10.9 (L) 07/02/2022   HCT 35.1 (L) 07/02/2022   MCV 78.8 07/02/2022   PLT 221.0 07/02/2022   Lab Results  Component Value Date   CREATININE 0.99 02/13/2023   BUN 20 02/13/2023   NA 133 (L) 02/13/2023   K 4.3 02/13/2023   CL 95 (L) 02/13/2023   CO2 22 02/13/2023   Lab Results  Component Value Date   ALT 9 02/13/2023   AST 18 02/13/2023   ALKPHOS 83 02/13/2023   BILITOT 0.6 02/13/2023   Lab Results  Component Value Date   CHOL 103 07/02/2022   HDL 45.20 07/02/2022   LDLCALC 39 07/02/2022   TRIG 92.0 07/02/2022   CHOLHDL 2  07/02/2022    Lab Results  Component Value Date   HGBA1C 8.1 (H) 02/13/2023    Assessment & Plan    1. Bilateral  lower extremity edema: She reports a several month history of increased bilateral left lower extremity edema, right greater than left.  BNP was previously elevated.  Symptoms improved somewhat with Lasix .  Repeat echocardiogram completed today, results pending.  She continues to note bilateral lower extremity edema.  There does appear to be some dependent component to this, however, given history of elevated BNP, will increase Lasix  to 40 mg daily.  Will update CMET, BNP today.  Will repeat BMET in 2 weeks.  If no improvement in swelling, consider transition to torsemide.  2. Persistent atrial fibrillation: Rate controlled.  Denies palpitations, denies bleeding.  Will check CBC, CMET today.  Continue metoprolol , Eliquis .  3. Hypertension: BP well controlled. Continue current antihypertensive regimen.   4. Hyperlipidemia: LDL was 39 in 06/2022.  Update fasting lipid panel, CMET today.  Continue Lipitor.   5. History of CVA: Residual weakness, wheelchair-bound.  6. CKD stage III: Creatinine was stable at 0.99 in 01/2023.  Repeat CMP pending as above.  7. Type 2 diabetes: A1c was 8.1 in 01/2023.  Monitored and managed per PCP.  However, patient has difficulty leaving the house for appointments.  Per patient request, will update A1c today.  8. Disposition: Follow-up in 08/2023 with Dr. Katheryne Pane.      Jude Norton, NP 05/21/2023, 10:18 PM

## 2023-05-23 LAB — BASIC METABOLIC PANEL WITH GFR
BUN/Creatinine Ratio: 16 (ref 12–28)
BUN: 21 mg/dL (ref 8–27)
CO2: 17 mmol/L — ABNORMAL LOW (ref 20–29)
Calcium: 10.3 mg/dL (ref 8.7–10.3)
Chloride: 95 mmol/L — ABNORMAL LOW (ref 96–106)
Creatinine, Ser: 1.3 mg/dL — ABNORMAL HIGH (ref 0.57–1.00)
Glucose: 166 mg/dL — ABNORMAL HIGH (ref 70–99)
Potassium: 4.9 mmol/L (ref 3.5–5.2)
Sodium: 138 mmol/L (ref 134–144)
eGFR: 42 mL/min/{1.73_m2} — ABNORMAL LOW (ref >59)

## 2023-05-26 ENCOUNTER — Ambulatory Visit: Payer: Self-pay

## 2023-05-28 ENCOUNTER — Ambulatory Visit: Payer: Self-pay

## 2023-06-01 ENCOUNTER — Inpatient Hospital Stay (HOSPITAL_COMMUNITY)
Admission: EM | Admit: 2023-06-01 | Discharge: 2023-06-05 | DRG: 871 | Disposition: A | Discharge status: Home Health | Payer: Medicare (Managed Care) | Attending: Family Medicine | Admitting: Family Medicine

## 2023-06-01 ENCOUNTER — Emergency Department (HOSPITAL_COMMUNITY): Payer: Medicare (Managed Care)

## 2023-06-01 ENCOUNTER — Other Ambulatory Visit: Payer: Self-pay

## 2023-06-01 DIAGNOSIS — E860 Dehydration: Secondary | ICD-10-CM | POA: Diagnosis present

## 2023-06-01 DIAGNOSIS — Z8673 Personal history of transient ischemic attack (TIA), and cerebral infarction without residual deficits: Secondary | ICD-10-CM

## 2023-06-01 DIAGNOSIS — R6520 Severe sepsis without septic shock: Secondary | ICD-10-CM | POA: Diagnosis present

## 2023-06-01 DIAGNOSIS — K5909 Other constipation: Secondary | ICD-10-CM | POA: Diagnosis present

## 2023-06-01 DIAGNOSIS — Z7984 Long term (current) use of oral hypoglycemic drugs: Secondary | ICD-10-CM | POA: Diagnosis not present

## 2023-06-01 DIAGNOSIS — E66813 Obesity, class 3: Secondary | ICD-10-CM | POA: Diagnosis present

## 2023-06-01 DIAGNOSIS — I5032 Chronic diastolic (congestive) heart failure: Secondary | ICD-10-CM | POA: Diagnosis present

## 2023-06-01 DIAGNOSIS — I4819 Other persistent atrial fibrillation: Secondary | ICD-10-CM | POA: Diagnosis present

## 2023-06-01 DIAGNOSIS — E1140 Type 2 diabetes mellitus with diabetic neuropathy, unspecified: Secondary | ICD-10-CM | POA: Diagnosis present

## 2023-06-01 DIAGNOSIS — E1122 Type 2 diabetes mellitus with diabetic chronic kidney disease: Secondary | ICD-10-CM | POA: Diagnosis present

## 2023-06-01 DIAGNOSIS — Z7985 Long-term (current) use of injectable non-insulin antidiabetic drugs: Secondary | ICD-10-CM | POA: Diagnosis not present

## 2023-06-01 DIAGNOSIS — L8990 Pressure ulcer of unspecified site, unspecified stage: Secondary | ICD-10-CM | POA: Insufficient documentation

## 2023-06-01 DIAGNOSIS — E11649 Type 2 diabetes mellitus with hypoglycemia without coma: Secondary | ICD-10-CM | POA: Diagnosis not present

## 2023-06-01 DIAGNOSIS — I130 Hypertensive heart and chronic kidney disease with heart failure and stage 1 through stage 4 chronic kidney disease, or unspecified chronic kidney disease: Secondary | ICD-10-CM | POA: Diagnosis present

## 2023-06-01 DIAGNOSIS — G4733 Obstructive sleep apnea (adult) (pediatric): Secondary | ICD-10-CM | POA: Diagnosis present

## 2023-06-01 DIAGNOSIS — A419 Sepsis, unspecified organism: Principal | ICD-10-CM | POA: Diagnosis present

## 2023-06-01 DIAGNOSIS — Z794 Long term (current) use of insulin: Secondary | ICD-10-CM

## 2023-06-01 DIAGNOSIS — Z8249 Family history of ischemic heart disease and other diseases of the circulatory system: Secondary | ICD-10-CM

## 2023-06-01 DIAGNOSIS — E872 Acidosis, unspecified: Secondary | ICD-10-CM | POA: Diagnosis present

## 2023-06-01 DIAGNOSIS — E1165 Type 2 diabetes mellitus with hyperglycemia: Secondary | ICD-10-CM | POA: Diagnosis present

## 2023-06-01 DIAGNOSIS — A4159 Other Gram-negative sepsis: Principal | ICD-10-CM | POA: Diagnosis present

## 2023-06-01 DIAGNOSIS — E785 Hyperlipidemia, unspecified: Secondary | ICD-10-CM | POA: Diagnosis present

## 2023-06-01 DIAGNOSIS — L89312 Pressure ulcer of right buttock, stage 2: Secondary | ICD-10-CM | POA: Diagnosis present

## 2023-06-01 DIAGNOSIS — Z7401 Bed confinement status: Secondary | ICD-10-CM

## 2023-06-01 DIAGNOSIS — Z823 Family history of stroke: Secondary | ICD-10-CM

## 2023-06-01 DIAGNOSIS — Z833 Family history of diabetes mellitus: Secondary | ICD-10-CM

## 2023-06-01 DIAGNOSIS — Z7901 Long term (current) use of anticoagulants: Secondary | ICD-10-CM | POA: Diagnosis not present

## 2023-06-01 DIAGNOSIS — N1831 Chronic kidney disease, stage 3a: Secondary | ICD-10-CM | POA: Diagnosis present

## 2023-06-01 DIAGNOSIS — K5730 Diverticulosis of large intestine without perforation or abscess without bleeding: Secondary | ICD-10-CM | POA: Diagnosis present

## 2023-06-01 DIAGNOSIS — N390 Urinary tract infection, site not specified: Secondary | ICD-10-CM | POA: Diagnosis not present

## 2023-06-01 DIAGNOSIS — I2510 Atherosclerotic heart disease of native coronary artery without angina pectoris: Secondary | ICD-10-CM | POA: Diagnosis present

## 2023-06-01 DIAGNOSIS — G9341 Metabolic encephalopathy: Secondary | ICD-10-CM | POA: Diagnosis present

## 2023-06-01 DIAGNOSIS — E861 Hypovolemia: Secondary | ICD-10-CM | POA: Diagnosis present

## 2023-06-01 DIAGNOSIS — N10 Acute tubulo-interstitial nephritis: Secondary | ICD-10-CM | POA: Diagnosis present

## 2023-06-01 DIAGNOSIS — I10 Essential (primary) hypertension: Secondary | ICD-10-CM | POA: Diagnosis present

## 2023-06-01 DIAGNOSIS — Z6841 Body Mass Index (BMI) 40.0 and over, adult: Secondary | ICD-10-CM

## 2023-06-01 LAB — ETHANOL: Alcohol, Ethyl (B): <15 mg/dL (ref <15)

## 2023-06-01 LAB — CBC WITH DIFFERENTIAL/PLATELET
Abs Immature Granulocytes: 0.06 10*3/uL (ref 0.00–0.07)
Basophils Absolute: 0 10*3/uL (ref 0.0–0.1)
Basophils Relative: 0 %
Eosinophils Absolute: 0 10*3/uL (ref 0.0–0.5)
Eosinophils Relative: 0 %
HCT: 31.4 % — ABNORMAL LOW (ref 36.0–46.0)
Hemoglobin: 9.7 g/dL — ABNORMAL LOW (ref 12.0–15.0)
Immature Granulocytes: 1 %
Lymphocytes Relative: 4 %
Lymphs Abs: 0.4 10*3/uL — ABNORMAL LOW (ref 0.7–4.0)
MCH: 21.7 pg — ABNORMAL LOW (ref 26.0–34.0)
MCHC: 30.9 g/dL (ref 30.0–36.0)
MCV: 70.2 fL — ABNORMAL LOW (ref 80.0–100.0)
Monocytes Absolute: 0.8 10*3/uL (ref 0.1–1.0)
Monocytes Relative: 8 %
Neutro Abs: 8.8 10*3/uL — ABNORMAL HIGH (ref 1.7–7.7)
Neutrophils Relative %: 87 %
Platelets: 205 10*3/uL (ref 150–400)
RBC: 4.47 MIL/uL (ref 3.87–5.11)
RDW: 19.2 % — ABNORMAL HIGH (ref 11.5–15.5)
WBC: 10 10*3/uL (ref 4.0–10.5)
nRBC: 0 % (ref 0.0–0.2)

## 2023-06-01 LAB — COMPREHENSIVE METABOLIC PANEL WITH GFR
ALT: 19 U/L (ref 0–44)
AST: 49 U/L — ABNORMAL HIGH (ref 15–41)
Albumin: 3.1 g/dL — ABNORMAL LOW (ref 3.5–5.0)
Alkaline Phosphatase: 91 U/L (ref 38–126)
Anion gap: 14 (ref 5–15)
BUN: 22 mg/dL (ref 8–23)
CO2: 25 mmol/L (ref 22–32)
Calcium: 9.2 mg/dL (ref 8.9–10.3)
Chloride: 93 mmol/L — ABNORMAL LOW (ref 98–111)
Creatinine, Ser: 1.12 mg/dL — ABNORMAL HIGH (ref 0.44–1.00)
GFR, Estimated: 50 mL/min — ABNORMAL LOW (ref >60)
Glucose, Bld: 202 mg/dL — ABNORMAL HIGH (ref 70–99)
Potassium: 4.2 mmol/L (ref 3.5–5.1)
Sodium: 132 mmol/L — ABNORMAL LOW (ref 135–145)
Total Bilirubin: 1.4 mg/dL — ABNORMAL HIGH (ref 0.0–1.2)
Total Protein: 7.2 g/dL (ref 6.5–8.1)

## 2023-06-01 LAB — GLUCOSE, CAPILLARY: Glucose-Capillary: 93 mg/dL (ref 70–99)

## 2023-06-01 LAB — URINALYSIS, W/ REFLEX TO CULTURE (INFECTION SUSPECTED)
Bilirubin Urine: NEGATIVE
Glucose, UA: NEGATIVE mg/dL
Ketones, ur: NEGATIVE mg/dL
Nitrite: NEGATIVE
Protein, ur: 100 mg/dL — AB
Specific Gravity, Urine: 1.009 (ref 1.005–1.030)
pH: 6 (ref 5.0–8.0)

## 2023-06-01 LAB — LIPASE, BLOOD: Lipase: 28 U/L (ref 11–51)

## 2023-06-01 LAB — AMMONIA: Ammonia: 28 umol/L (ref 9–35)

## 2023-06-01 LAB — LACTIC ACID, PLASMA: Lactic Acid, Venous: 5.5 mmol/L (ref 0.5–1.9)

## 2023-06-01 LAB — TSH: TSH: 1.98 u[IU]/mL (ref 0.350–4.500)

## 2023-06-01 MED ORDER — IPRATROPIUM-ALBUTEROL 0.5-2.5 (3) MG/3ML IN SOLN
3.0000 mL | RESPIRATORY_TRACT | Status: DC | PRN
  Administered 2023-06-02: 3 mL via RESPIRATORY_TRACT
  Filled 2023-06-01: qty 3

## 2023-06-01 MED ORDER — SODIUM CHLORIDE 0.9 % IV SOLN
1.0000 g | Freq: Once | INTRAVENOUS | Status: AC
  Administered 2023-06-01: 1 g via INTRAVENOUS
  Filled 2023-06-01: qty 10

## 2023-06-01 MED ORDER — ATORVASTATIN CALCIUM 10 MG PO TABS
20.0000 mg | ORAL_TABLET | Freq: Every day | ORAL | Status: DC
  Administered 2023-06-01 – 2023-06-05 (×5): 20 mg via ORAL
  Filled 2023-06-01 (×5): qty 2

## 2023-06-01 MED ORDER — GUAIFENESIN-DM 100-10 MG/5ML PO SYRP
5.0000 mL | ORAL_SOLUTION | ORAL | Status: DC | PRN
  Administered 2023-06-05: 5 mL via ORAL
  Filled 2023-06-01: qty 5

## 2023-06-01 MED ORDER — VANCOMYCIN HCL 10 G IV SOLR
2250.0000 mg | Freq: Once | INTRAVENOUS | Status: DC
  Filled 2023-06-01: qty 22.5

## 2023-06-01 MED ORDER — LACTATED RINGERS IV SOLN: INTRAVENOUS | Status: DC

## 2023-06-01 MED ORDER — SODIUM CHLORIDE 0.9 % IV SOLN
2.0000 g | Freq: Two times a day (BID) | INTRAVENOUS | Status: DC
  Administered 2023-06-02 (×2): 2 g via INTRAVENOUS
  Filled 2023-06-01 (×2): qty 12.5

## 2023-06-01 MED ORDER — SODIUM CHLORIDE 0.9 % IV SOLN
2.0000 g | Freq: Once | INTRAVENOUS | Status: DC
  Filled 2023-06-01: qty 12.5

## 2023-06-01 MED ORDER — MELATONIN 5 MG PO TABS
5.0000 mg | ORAL_TABLET | Freq: Every evening | ORAL | Status: DC | PRN
  Administered 2023-06-01 – 2023-06-04 (×4): 5 mg via ORAL
  Filled 2023-06-01 (×4): qty 1

## 2023-06-01 MED ORDER — LACTATED RINGERS IV BOLUS
30.0000 mL/kg | Freq: Once | INTRAVENOUS | Status: AC
  Administered 2023-06-01: 1572 mL via INTRAVENOUS

## 2023-06-01 MED ORDER — ACETAMINOPHEN 325 MG PO TABS
650.0000 mg | ORAL_TABLET | Freq: Four times a day (QID) | ORAL | Status: DC | PRN
Start: 2023-06-01 — End: 2023-06-06
  Administered 2023-06-02 – 2023-06-03 (×4): 650 mg via ORAL
  Filled 2023-06-01 (×4): qty 2

## 2023-06-01 MED ORDER — INSULIN ASPART 100 UNIT/ML IJ SOLN
4.0000 [IU] | Freq: Three times a day (TID) | INTRAMUSCULAR | Status: DC
Start: 2023-06-01 — End: 2023-06-02

## 2023-06-01 MED ORDER — VANCOMYCIN HCL 2000 MG/400ML IV SOLN
2000.0000 mg | Freq: Once | INTRAVENOUS | Status: AC
  Administered 2023-06-02: 2000 mg via INTRAVENOUS
  Filled 2023-06-01: qty 400

## 2023-06-01 MED ORDER — INSULIN ASPART 100 UNIT/ML IJ SOLN: 0.0000 [IU] | Freq: Every day | INTRAMUSCULAR | Status: DC

## 2023-06-01 MED ORDER — APIXABAN 5 MG PO TABS
5.0000 mg | ORAL_TABLET | Freq: Two times a day (BID) | ORAL | Status: DC
  Administered 2023-06-01 – 2023-06-02 (×2): 5 mg via ORAL
  Filled 2023-06-01 (×2): qty 1

## 2023-06-01 MED ORDER — ACETAMINOPHEN 500 MG PO TABS
1000.0000 mg | ORAL_TABLET | Freq: Once | ORAL | Status: AC
  Administered 2023-06-01: 1000 mg via ORAL
  Filled 2023-06-01: qty 2

## 2023-06-01 MED ORDER — METOPROLOL TARTRATE 12.5 MG HALF TABLET
12.5000 mg | ORAL_TABLET | Freq: Two times a day (BID) | ORAL | Status: DC
  Administered 2023-06-01 – 2023-06-05 (×8): 12.5 mg via ORAL
  Filled 2023-06-01 (×8): qty 1

## 2023-06-01 MED ORDER — POLYETHYLENE GLYCOL 3350 17 G PO PACK
17.0000 g | PACK | Freq: Every day | ORAL | Status: DC | PRN
  Administered 2023-06-02: 17 g via ORAL
  Filled 2023-06-01: qty 1

## 2023-06-01 MED ORDER — PROCHLORPERAZINE EDISYLATE 10 MG/2ML IJ SOLN
5.0000 mg | Freq: Four times a day (QID) | INTRAMUSCULAR | Status: DC | PRN
Start: 2023-06-01 — End: 2023-06-02

## 2023-06-01 MED ORDER — VANCOMYCIN HCL IN DEXTROSE 1-5 GM/200ML-% IV SOLN
1000.0000 mg | Freq: Once | INTRAVENOUS | Status: DC
Start: 2023-06-01 — End: 2023-06-01

## 2023-06-01 MED ORDER — SODIUM CHLORIDE 0.9 % IV BOLUS: 1000.0000 mL | Freq: Once | INTRAVENOUS | Status: DC

## 2023-06-01 MED ORDER — INSULIN ASPART 100 UNIT/ML IJ SOLN
0.0000 [IU] | Freq: Three times a day (TID) | INTRAMUSCULAR | Status: DC
  Administered 2023-06-02 – 2023-06-03 (×2): 1 [IU] via SUBCUTANEOUS
  Administered 2023-06-03: 4 [IU] via SUBCUTANEOUS
  Administered 2023-06-04 (×2): 2 [IU] via SUBCUTANEOUS
  Administered 2023-06-04: 1 [IU] via SUBCUTANEOUS
  Administered 2023-06-05 (×2): 2 [IU] via SUBCUTANEOUS

## 2023-06-01 MED ORDER — METRONIDAZOLE 500 MG/100ML IV SOLN
500.0000 mg | Freq: Once | INTRAVENOUS | Status: DC
  Filled 2023-06-01: qty 100

## 2023-06-01 MED ORDER — INSULIN GLARGINE-YFGN 100 UNIT/ML ~~LOC~~ SOLN
5.0000 [IU] | Freq: Two times a day (BID) | SUBCUTANEOUS | Status: DC
  Administered 2023-06-01: 5 [IU] via SUBCUTANEOUS
  Filled 2023-06-01 (×3): qty 0.05

## 2023-06-01 NOTE — ED Provider Notes (Addendum)
 [place] EMERGENCY DEPARTMENT AT [facility] Provider Note  CSN: [id number] Arrival date & time: 06/01/23 1647  Chief Complaint(s) Altered Mental Status (Pt bib ems from home.Family reports AMS, LKN @10 :00AM. - stroke scale, A&Ox3. PMH: CVA, Afib, dm, htn, hld)  HPI Jade Ellison is a 78 y.o. female history of CKD, diabetes, prior stroke, A-fib on Eliquis  presenting to the emergency department with mental status change.  Patient brought from home.  Per paramedics, patient was last seen normal by family around 33.  Family found afternoon that she was confused.  Patient denies any complaints currently.  She reports that she is here "to get some tests".  History otherwise limited due to altered mental status   Past Medical History Past Medical History:  Diagnosis Date   Adult stuttering    Anemia    Anxiety    Panic attack- - 1 year ago (cries - doneest know why and gets nervous   Arthritis    Barrett esophagus 10/14/10   CAD (coronary artery disease)    Chronic kidney disease, stage 3a (HCC) 10/13/2016   GFR 47 June 2018   Colon polyp    polypoid colorectal mucosa   Complication of anesthesia    Depression    Diverticulosis    DJD (degenerative joint disease)    left knee   Esophagitis    Gastritis    Glaucoma    Helicobacter pylori (H. pylori)    Hemorrhoids    Hernia of unspecified site of abdominal cavity without mention of obstruction or gangrene    History of blood transfusion    History of kidney stones    Hypertension 09 07 2013   TRANSTHORACIC ECHO STUDY CONCLUSIONS    Hypertension 09 07 2013   EJECTION FRACTION- 55%-60% .WALL MOTION WAS NORMAL   Impaired mobility    Iron  deficiency anemia    Morbid obesity (HCC)    Neuropathy    Obesity    Olecranon bursitis of left elbow    OSA (obstructive sleep apnea)    On cpap   Osteoarthritis    right hip   Osteoporosis    Primary osteoarthritis    Bilaterally (knee)   PSVT (paroxysmal supraventricular  tachycardia) (HCC)    PSVT (paroxysmal supraventricular tachycardia) (HCC)    Short-term memory loss    Shortness of breath dyspnea    Sleep apnea    on CPAP- does not use machine   Spinal headache 1986   Type 2 diabetes mellitus (HCC)    Patient Active Problem List   Diagnosis Date Noted   Sepsis (HCC) 06/01/2023   Opioid-induced constipation 11/18/2022   Pruritus 10/08/2022   Primary osteoarthritis of both hips 07/02/2022   Acquired thrombophilia (HCC) 07/02/2022   History of stroke 07/02/2022   Recurrent umbilical hernia 07/02/2022   Acute ischemic stroke (HCC) 10/06/2021   Type 2 diabetes mellitus with stage 3a chronic kidney disease (HCC) 10/06/2021   Paroxysmal atrial fibrillation (HCC) 10/06/2021   Polyp of transverse colon    Polyp of ascending colon    Recurrent UTI 01/16/2019   Short-term memory loss 10/06/2018   GAD (generalized anxiety disorder) 09/20/2017   Irritable bowel syndrome with both constipation and diarrhea 09/20/2017   Chronic kidney disease, stage 3a (HCC) 10/13/2016   Gastroesophageal reflux disease without esophagitis    Diabetic peripheral neuropathy associated with type 2 diabetes mellitus (HCC) 11/17/2014   Barrett's esophagus without dysplasia 06/26/2014   Dyslipidemia 05/24/2013   SVT (supraventricular tachycardia) (HCC)  09/19/2011   Aphagia 09/19/2011   Osteopenia of the elderly 07/29/2011   History of lower GI bleeding 04/10/2011   Normocytic anemia 04/10/2011   Class 3 severe obesity due to excess calories with serious comorbidity and body mass index (BMI) of 50.0 to 59.9 in adult 04/10/2011   Mixed incontinence 04/07/2007   Essential hypertension 03/25/2007   Diverticulosis of colon 03/25/2007   OSA (obstructive sleep apnea) 03/25/2007   History of kidney stones 03/25/2007   Hemorrhoids 03/25/2007   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/2007   Primary osteoarthritis of both knees 12/15/2006   CAD (coronary  artery disease) 12/15/2006   Home Medication(s) Prior to Admission medications   Medication Sig Start Date End Date Taking? Authorizing Provider  acetaminophen  (TYLENOL ) 325 MG tablet Take 650 mg by mouth every 6 (six) hours as needed for moderate pain or headache.    [provider]  ALPRAZolam  (XANAX ) 0.25 MG tablet Take 1 tablet (0.25 mg total) by mouth 2 (two) times daily as needed for anxiety. 03/30/23   Graig Lawyer, MD  apixaban  (ELIQUIS ) 5 MG TABS tablet Take 1 tablet (5 mg total) by mouth 2 (two) times daily. 10/08/22   Graig Lawyer, MD  atorvastatin  (LIPITOR) 20 MG tablet Take 1 tablet (20 mg total) by mouth daily. 04/27/23   Graig Lawyer, MD  desonide  (DESOWEN ) 0.05 % cream Apply 1 Application topically 2 (two) times daily as needed (dry skin/irritation). 10/08/22   Graig Lawyer, MD  dicyclomine  (BENTYL ) 20 MG tablet TAKE 1 TABLET(20 MG) BY MOUTH THREE TIMES DAILY AFTER MEALS Patient taking differently: Take 20 mg by mouth 2 (two) times daily. 05/19/17   Nandigam, Kavitha V, MD  DULoxetine  (CYMBALTA ) 60 MG capsule Take 1 capsule (60 mg total) by mouth daily. 11/18/22   Graig Lawyer, MD  furosemide  (LASIX ) 40 MG tablet Take 1 tablet (40 mg total) by mouth daily. 05/21/23 08/19/23  Jude Norton, NP  glipiZIDE  (GLUCOTROL  XL) 5 MG 24 hr tablet Take 1 tablet (5 mg total) by mouth daily. 04/27/23   Graig Lawyer, MD  HYDROcodone -acetaminophen  (NORCO) 10-325 MG tablet Take 1 tablet by mouth daily as needed for severe pain (pain score 7-10). 03/30/23   Graig Lawyer, MD  latanoprost  (XALATAN ) 0.005 % ophthalmic solution Place 1 drop into both eyes at bedtime.  12/05/11   [provider]  metFORMIN (GLUCOPHAGE) 1000 MG tablet Take 1,000 mg by mouth 2 (two) times daily. 09/24/22   [provider]  metoprolol  tartrate (LOPRESSOR ) 50 MG tablet Take 1 tablet (50 mg total) by mouth 2 (two) times daily. 10/08/22   Graig Lawyer, MD  Mouthwashes (BIOTENE/CALCIUM  MT)  Take 1 Dose by mouth daily as needed (dry mouth).     [provider]  Multiple Vitamin (MULTIVITAMIN WITH MINERALS) TABS tablet Take 1 tablet by mouth daily.    [provider]  omeprazole  (PRILOSEC) 40 MG capsule TAKE 1 CAPSULE(40 MG) BY MOUTH DAILY 04/20/23   Graig Lawyer, MD  pregabalin  (LYRICA ) 50 MG capsule TAKE 1 CAPSULE(50 MG) BY MOUTH TWICE DAILY 04/20/23   Graig Lawyer, MD  psyllium (METAMUCIL SMOOTH TEXTURE) 28 % packet Take 1 packet by mouth daily as needed (constipation).     [provider]  tirzepatide Florence Hunt) 5 MG/0.5ML Pen Inject 5 mg into the skin once a week. 02/13/23   Graig Lawyer, MD  Past Surgical History Past Surgical History:  Procedure Laterality Date   BIOPSY  11/07/2019   Procedure: BIOPSY;  Surgeon: Sergio Dandy, MD;  Location: [facility];  Service: Endoscopy;;   CARDIAC CATHETERIZATION  01/13/2001   normal coronary arteries and nomal LV function by cath performed by Dr.Peter Nishan   CESAREAN SECTION  01/14/1984   CHOLECYSTECTOMY     COLONOSCOPY WITH PROPOFOL  N/A 11/07/2019   Procedure: COLONOSCOPY WITH PROPOFOL ;  Surgeon: Sergio Dandy, MD;  Location: [facility];  Service: Endoscopy;  Laterality: N/A;   ENTEROSCOPY  02/05/2012   Procedure: ENTEROSCOPY;  Surgeon: Pietro Bridegroom, MD;  Location: [facility];  Service: Endoscopy;  Laterality: N/A;   ESOPHAGOGASTRODUODENOSCOPY N/A 04/19/2014   Procedure: ESOPHAGOGASTRODUODENOSCOPY (EGD);  Surgeon: Pietro Bridegroom, MD;  Location: [facility];  Service: Endoscopy;  Laterality: N/A;   ESOPHAGOGASTRODUODENOSCOPY (EGD) WITH PROPOFOL  N/A 08/01/2015   Procedure: ESOPHAGOGASTRODUODENOSCOPY (EGD) WITH PROPOFOL ;  Surgeon: Sergio Dandy, MD;  Location: [facility];  Service: Endoscopy;  Laterality: N/A;   ESOPHAGOGASTRODUODENOSCOPY (EGD) WITH  PROPOFOL  N/A 11/07/2019   Procedure: ESOPHAGOGASTRODUODENOSCOPY (EGD) WITH PROPOFOL ;  Surgeon: Sergio Dandy, MD;  Location: [facility];  Service: Endoscopy;  Laterality: N/A;   EXTRACORPOREAL SHOCK WAVE LITHOTRIPSY Left    HERNIA REPAIR     HOT HEMOSTASIS  02/05/2012   Procedure: HOT HEMOSTASIS (ARGON PLASMA COAGULATION/BICAP);  Surgeon: Pietro Bridegroom, MD;  Location: [facility];  Service: Endoscopy;  Laterality: N/A;   KNEE ARTHROSCOPY Left    POLYPECTOMY  11/07/2019   Procedure: POLYPECTOMY;  Surgeon: Sergio Dandy, MD;  Location: [facility];  Service: Endoscopy;;   TUBAL LIGATION     UMBILICAL HERNIA REPAIR     Family History Family History  Problem Relation Age of Onset   Heart disease Mother    Heart disease Father    Diabetes Sister    Stroke Brother    Hypertension Brother    Diabetes Brother    Cancer Brother        Renal   Cancer Brother        Prostate   Heart disease Brother    Heart disease Brother    Cancer Brother        Prostate, bladder   Diabetes Paternal Grandmother    Colon cancer Neg Hx     Social History Social History   Tobacco Use   Smoking status: Never    Passive exposure: Never   Smokeless tobacco: Never  Vaping Use   Vaping status: Never Used  Substance Use Topics   Alcohol use: Yes    Alcohol/week: 1.0 standard drink of alcohol    Types: 1 Glasses of wine per week    Comment: wine sometimes on holidays and bedtime- rarely   Drug use: No   Allergies Neurontin [gabapentin], Codeine, Fosamax [alendronate sodium], and Nsaids  Review of Systems Review of Systems  All other systems reviewed and are negative.   Physical Exam Vital Signs  I have reviewed the triage vital signs Pulse (!) 123   Temp (!) 101.6 F (38.7 C) (Oral)   Resp (!) 24   Ht 5\' 3"  (1.6 m)   Wt 117.9 kg   SpO2 98%   BMI 46.04 kg/m  Physical Exam Vitals and nursing note reviewed.  Constitutional:      General: She is not in acute  distress.    Appearance: She is well-developed. She is obese.  HENT:     Head: Normocephalic and atraumatic.  Mouth/Throat:     Mouth: Mucous membranes are dry.  Eyes:     Pupils: Pupils are equal, round, and reactive to light.  Cardiovascular:     Rate and Rhythm: Normal rate and regular rhythm.     Heart sounds: No murmur heard. Pulmonary:     Effort: Pulmonary effort is normal. No respiratory distress.     Breath sounds: Normal breath sounds.  Abdominal:     General: Abdomen is flat.     Palpations: Abdomen is soft.     Tenderness: There is no abdominal tenderness.  Musculoskeletal:        General: No tenderness.     Right lower leg: No edema.     Left lower leg: No edema.  Skin:    General: Skin is warm and dry.  Neurological:     Mental Status: She is alert.     Comments: Cranial nerves II through XII intact, strength 5 out of 5 in the bilateral upper and lower extremities, no sensory deficit to light touch, no dysmetria on finger-nose-finger testing.  Patient oriented to self, knows she is in the emergency department, does not clearly know why she is here, does not know the year   Psychiatric:        Mood and Affect: Mood normal.        Behavior: Behavior normal.     ED Results and Treatments Labs (all labs ordered are listed, but only abnormal results are displayed) Labs Reviewed  COMPREHENSIVE METABOLIC PANEL WITH GFR - Abnormal; Notable for the following components:      Result Value   Sodium 132 (*)    Chloride 93 (*)    Glucose, Bld 202 (*)    Creatinine, Ser 1.12 (*)    Albumin 3.1 (*)    AST 49 (*)    Total Bilirubin 1.4 (*)    GFR, Estimated 50 (*)    All other components within normal limits  CBC WITH DIFFERENTIAL/PLATELET - Abnormal; Notable for the following components:   Hemoglobin 9.7 (*)    HCT 31.4 (*)    MCV 70.2 (*)    MCH 21.7 (*)    RDW 19.2 (*)    Neutro Abs 8.8 (*)    Lymphs Abs 0.4 (*)    All other components within normal limits   URINALYSIS, W/ REFLEX TO CULTURE (INFECTION SUSPECTED) - Abnormal; Notable for the following components:   APPearance HAZY (*)    Hgb urine dipstick MODERATE (*)    Protein, ur 100 (*)    Leukocytes,Ua LARGE (*)    Bacteria, UA MANY (*)    All other components within normal limits  CULTURE, BLOOD (ROUTINE X 2)  CULTURE, BLOOD (ROUTINE X 2)  URINE CULTURE  LIPASE, BLOOD  ETHANOL  TSH  AMMONIA  CBC  COMPREHENSIVE METABOLIC PANEL WITH GFR  MAGNESIUM   PHOSPHORUS  Radiology CT Head Wo Contrast Result Date: 06/01/2023 CLINICAL DATA:  Mental status change EXAM: CT HEAD WITHOUT CONTRAST TECHNIQUE: Contiguous axial images were obtained from the base of the skull through the vertex without intravenous contrast. RADIATION DOSE REDUCTION: This exam was performed according to the departmental dose-optimization program which includes automated exposure control, adjustment of the mA and/or kV according to patient size and/or use of iterative reconstruction technique. COMPARISON:  MRI 10/06/2021, CT brain 10/06/2021 FINDINGS: Brain: No acute territorial infarction, hemorrhage or intracranial mass. Atrophy. Mild to moderate patchy white matter hypodensity. Stable ventricle size. Vascular: No hyperdense vessels.  Carotid vascular calcification Skull: Normal. Negative for fracture or focal lesion. Sinuses/Orbits: No acute finding. Other: None IMPRESSION: 1. No CT evidence for acute intracranial abnormality. 2. Atrophy and chronic small vessel ischemic changes of the white matter. Electronically Signed   By: Esmeralda Hedge M.D.   On: 06/01/2023 18:56   DG Chest Port 1 View Result Date: 06/01/2023 CLINICAL DATA:  Weakness EXAM: PORTABLE CHEST 1 VIEW COMPARISON:  11/23/2006 FINDINGS: Mild bronchitic changes. No consolidation or effusion. Upper normal cardiac size likely augmented by rotation.  Aortic atherosclerosis. No pneumothorax IMPRESSION: Mild bronchitic changes. Electronically Signed   By: Esmeralda Hedge M.D.   On: 06/01/2023 18:53    Pertinent labs & imaging results that were available during my care of the patient were reviewed by me and considered in my medical decision making (see MDM for details).  Medications Ordered in ED Medications  lactated ringers  infusion (has no administration in time range)  lactated ringers  bolus 1,572 mL (has no administration in time range)  cefTRIAXone  (ROCEPHIN ) 1 g in sodium chloride  0.9 % 100 mL IVPB (has no administration in time range)  atorvastatin  (LIPITOR) tablet 20 mg (has no administration in time range)  apixaban  (ELIQUIS ) tablet 5 mg (has no administration in time range)  acetaminophen  (TYLENOL ) tablet 1,000 mg (1,000 mg Oral Given 06/01/23 1736)                                                                                                                                     Procedures .Ultrasound ED Peripheral IV (Provider)  Date/Time: 06/01/2023 7:34 PM  Performed by: Mordecai Applebaum, MD Authorized by: Mordecai Applebaum, MD   Procedure details:    Indications: hydration, multiple failed IV attempts and poor IV access     Skin Prep: chlorhexidine gluconate     Location:  Left AC   Angiocath:  18 G   Bedside Ultrasound Guided: Yes     Images: not archived     Patient tolerated procedure without complications: Yes     Dressing applied: Yes   .Critical Care  Performed by: Mordecai Applebaum, MD Authorized by: Mordecai Applebaum, MD   Critical care provider statement:    Critical care time (minutes):  30   Critical care was necessary to treat or prevent imminent  or life-threatening deterioration of the following conditions:  Sepsis   Critical care was time spent personally by me on the following activities:  Development of treatment plan with patient or surrogate, discussions with consultants, evaluation of  patient's response to treatment, examination of patient, ordering and review of laboratory studies, ordering and review of radiographic studies, ordering and performing treatments and interventions, pulse oximetry, re-evaluation of patient's condition and review of old charts   (including critical care time)  Medical Decision Making / ED Course   MDM:  78 year old presenting to the emergency department with confusion.  Patient mildly dehydrated appearing, examination otherwise nonfocal.  No focal deficit.  Differential includes toxic metabolic process, will check labs.  Also check CT head, differential includes intracranial process although low concern for stroke with reassuring neurologic exam in the emergency department.  Patient also outside window for TNK.  Does not have clinical findings for large vessel occlusion.  Differentials includes occult infectious process, per EMS patient chronically incontinent, has frequent UTIs, will obtain cath urine, also obtain chest x-ray.  Given worsening confusion, anticipate patient may need to be admitted.  Clinical Course as of 06/01/23 Olean Berkshire Jun 01, 2023  1933 CXR, CT head negative. UA does show uti. Placed US  IV. Will give CTX instead. Discussed with Dr. Del Favia who will admit patient for sepsis/UTI [WS]    Clinical Course User Index [WS] Mordecai Applebaum, MD     Additional history obtained: -Additional history obtained from family and ems -External records from outside source obtained and reviewed including: Chart review including previous notes, labs, imaging, consultation notes including prior notes    Lab Tests: -I ordered, reviewed, and interpreted labs.   The pertinent results include:   Labs Reviewed  COMPREHENSIVE METABOLIC PANEL WITH GFR - Abnormal; Notable for the following components:      Result Value   Sodium 132 (*)    Chloride 93 (*)    Glucose, Bld 202 (*)    Creatinine, Ser 1.12 (*)    Albumin 3.1 (*)    AST 49  (*)    Total Bilirubin 1.4 (*)    GFR, Estimated 50 (*)    All other components within normal limits  CBC WITH DIFFERENTIAL/PLATELET - Abnormal; Notable for the following components:   Hemoglobin 9.7 (*)    HCT 31.4 (*)    MCV 70.2 (*)    MCH 21.7 (*)    RDW 19.2 (*)    Neutro Abs 8.8 (*)    Lymphs Abs 0.4 (*)    All other components within normal limits  URINALYSIS, W/ REFLEX TO CULTURE (INFECTION SUSPECTED) - Abnormal; Notable for the following components:   APPearance HAZY (*)    Hgb urine dipstick MODERATE (*)    Protein, ur 100 (*)    Leukocytes,Ua LARGE (*)    Bacteria, UA MANY (*)    All other components within normal limits  CULTURE, BLOOD (ROUTINE X 2)  CULTURE, BLOOD (ROUTINE X 2)  URINE CULTURE  LIPASE, BLOOD  ETHANOL  TSH  AMMONIA  CBC  COMPREHENSIVE METABOLIC PANEL WITH GFR  MAGNESIUM   PHOSPHORUS    Notable for UTI   Imaging Studies ordered: I ordered imaging studies including CXR, CT head On my interpretation imaging demonstrates no acute process I independently visualized and interpreted imaging. I agree with the radiologist interpretation   Medicines ordered and prescription drug management: Meds ordered this encounter  Medications   DISCONTD: sodium chloride  0.9 % bolus 1,000 mL  lactated ringers  infusion   DISCONTD: ceFEPIme  (MAXIPIME ) 2 g in sodium chloride  0.9 % 100 mL IVPB    Antibiotic Indication::   Other Indication (list below)    Other Indication::   Unknown Source.   DISCONTD: metroNIDAZOLE  (FLAGYL ) IVPB 500 mg    Antibiotic Indication::   Other Indication (list below)    Other Indication::   Unknown Source.   DISCONTD: vancomycin  (VANCOCIN ) IVPB 1000 mg/200 mL premix    Indication::   Other Indication (list below)    Other Indication::   Unknown Source.   DISCONTD: vancomycin  (VANCOCIN ) 2,250 mg in sodium chloride  0.9 % 500 mL IVPB    Indication::   Other Indication (list below)   lactated ringers  bolus 1,572 mL   acetaminophen   (TYLENOL ) tablet 1,000 mg   cefTRIAXone  (ROCEPHIN ) 1 g in sodium chloride  0.9 % 100 mL IVPB    Antibiotic Indication::   UTI   atorvastatin  (LIPITOR) tablet 20 mg   apixaban  (ELIQUIS ) tablet 5 mg    -I have reviewed the patients home medicines and have made adjustments as needed   Consultations Obtained: I requested consultation with the hospitalist,  and discussed lab and imaging findings as well as pertinent plan - they recommend: admissio   Social Determinants of Health:  Diagnosis or treatment significantly limited by social determinants of health: obesity   Reevaluation: After the interventions noted above, I reevaluated the patient and found that their symptoms have improved  Co morbidities that complicate the patient evaluation  Past Medical History:  Diagnosis Date   Adult stuttering    Anemia    Anxiety    Panic attack- - 1 year ago (cries - doneest know why and gets nervous   Arthritis    Barrett esophagus 10/14/10   CAD (coronary artery disease)    Chronic kidney disease, stage 3a (HCC) 10/13/2016   GFR 47 June 2018   Colon polyp    polypoid colorectal mucosa   Complication of anesthesia    Depression    Diverticulosis    DJD (degenerative joint disease)    left knee   Esophagitis    Gastritis    Glaucoma    Helicobacter pylori (H. pylori)    Hemorrhoids    Hernia of unspecified site of abdominal cavity without mention of obstruction or gangrene    History of blood transfusion    History of kidney stones    Hypertension 09 07 2013   TRANSTHORACIC ECHO STUDY CONCLUSIONS    Hypertension 09 07 2013   EJECTION FRACTION- 55%-60% .WALL MOTION WAS NORMAL   Impaired mobility    Iron  deficiency anemia    Morbid obesity (HCC)    Neuropathy    Obesity    Olecranon bursitis of left elbow    OSA (obstructive sleep apnea)    On cpap   Osteoarthritis    right hip   Osteoporosis    Primary osteoarthritis    Bilaterally (knee)   PSVT (paroxysmal  supraventricular tachycardia) (HCC)    PSVT (paroxysmal supraventricular tachycardia) (HCC)    Short-term memory loss    Shortness of breath dyspnea    Sleep apnea    on CPAP- does not use machine   Spinal headache 1986   Type 2 diabetes mellitus (HCC)       Dispostion: Disposition decision including need for hospitalization was considered, and patient admitted to the hospital.    Final Clinical Impression(s) / ED Diagnoses Final diagnoses:  Sepsis secondary to UTI (HCC)  This chart was dictated using voice recognition software.  Despite best efforts to proofread,  errors can occur which can change the documentation meaning.    Mordecai Applebaum, MD 06/01/23 [place] Lias    Mordecai Applebaum, MD 06/01/23 [place] Lias

## 2023-06-01 NOTE — Sepsis Progress Note (Signed)
 Elink will follow per sepsis protocol.

## 2023-06-01 NOTE — ED Notes (Signed)
 Delay in antibiotics due to pt not having IV access. Multiple attempts were made to gain access without success. IV team consult placed at 17:30.

## 2023-06-01 NOTE — ED Triage Notes (Signed)
 Pt bib ems from home.Family reports AMS, LKN @10 :00AM. - stroke scale, A&Ox3. PMH: CVA, Afib, dm, htn, hld

## 2023-06-01 NOTE — Sepsis Progress Note (Addendum)
 Per ED RN, Lactic never gathered d/t IV issues, RN will collect lactic now. At 23:07, Current provider was notified of lactic acid of 5.5

## 2023-06-01 NOTE — H&P (Addendum)
 History and Physical  Jade Ellison:[id number] DOB: [birth year] DOA: 06/01/2023  Referring physician: Dr. Isaiah Marc, EDP. PCP: Graig Lawyer, MD  Outpatient Specialists: Cardiology, pulmonary. Patient coming from: Home.  Chief Complaint: Confusion  HPI: CLORINE SWING is a 78 y.o. female with medical history significant for persistent atrial fibrillation on Eliquis , hypertension, hyperlipidemia, prior CVA, Barrett's esophagus, iron  deficiency anemia, CKD 3A, type 2 diabetes, OSA, morbid morbid obesity, who presents to the ER from home due to altered mental status noted this morning per family members.  Associated with dysuria.  Reportedly, the patient was acting more confused.  She was last known well around 10 AM this morning.  She was brought into the ER by EMS for further evaluation.  In the ER, febrile with Tmax of 103.1, tachycardic with pulse was in the 120s, tachypneic with respiration rate of 28, with UA positive for pyuria.  Chest x-ray showed mild bronchitic changes.  Code sepsis was called in the ER.  The patient was started on empiric IV antibiotics for presumed UTI and goal-directed IV fluid.  TR[facility], hospitalist service, was asked to admit.  At the time of this visit, the patient is in the room accompanied by her son.  She is alert and oriented x3.  Hypovolemic on exam.  ED Course: Tmax 103.1.  BP 120/83, pulse 124, respiration rate 22, O2 saturation 92% on room air.  Lab studies remarkable for WBC 10.0, hemoglobin 9.7, MCV 70, platelet count 205.  Serum sodium 132, glucose 202, creatinine 1.12 with GFR 50, at baseline.  Review of Systems: Review of systems as noted in the HPI. All other systems reviewed and are negative.   Past Medical History:  Diagnosis Date   Adult stuttering    Anemia    Anxiety    Panic attack- - 1 year ago (cries - doneest know why and gets nervous   Arthritis    Barrett esophagus 10/14/10   CAD (coronary artery disease)    Chronic kidney  disease, stage 3a (HCC) 10/13/2016   GFR 47 June 2018   Colon polyp    polypoid colorectal mucosa   Complication of anesthesia    Depression    Diverticulosis    DJD (degenerative joint disease)    left knee   Esophagitis    Gastritis    Glaucoma    Helicobacter pylori (H. pylori)    Hemorrhoids    Hernia of unspecified site of abdominal cavity without mention of obstruction or gangrene    History of blood transfusion    History of kidney stones    Hypertension 09 07 2013   TRANSTHORACIC ECHO STUDY CONCLUSIONS    Hypertension 09 07 2013   EJECTION FRACTION- 55%-60% .WALL MOTION WAS NORMAL   Impaired mobility    Iron  deficiency anemia    Morbid obesity (HCC)    Neuropathy    Obesity    Olecranon bursitis of left elbow    OSA (obstructive sleep apnea)    On cpap   Osteoarthritis    right hip   Osteoporosis    Primary osteoarthritis    Bilaterally (knee)   PSVT (paroxysmal supraventricular tachycardia) (HCC)    PSVT (paroxysmal supraventricular tachycardia) (HCC)    Short-term memory loss    Shortness of breath dyspnea    Sleep apnea    on CPAP- does not use machine   Spinal headache 1986   Type 2 diabetes mellitus (HCC)    Past Surgical History:  Procedure Laterality Date  BIOPSY  11/07/2019   Procedure: BIOPSY;  Surgeon: Sergio Dandy, MD;  Location: [facility];  Service: Endoscopy;;   CARDIAC CATHETERIZATION  01/13/2001   normal coronary arteries and nomal LV function by cath performed by Dr.Peter Nishan   CESAREAN SECTION  01/14/1984   CHOLECYSTECTOMY     COLONOSCOPY WITH PROPOFOL  N/A 11/07/2019   Procedure: COLONOSCOPY WITH PROPOFOL ;  Surgeon: Sergio Dandy, MD;  Location: [facility];  Service: Endoscopy;  Laterality: N/A;   ENTEROSCOPY  02/05/2012   Procedure: ENTEROSCOPY;  Surgeon: Pietro Bridegroom, MD;  Location: [facility];  Service: Endoscopy;  Laterality: N/A;   ESOPHAGOGASTRODUODENOSCOPY N/A 04/19/2014   Procedure:  ESOPHAGOGASTRODUODENOSCOPY (EGD);  Surgeon: Pietro Bridegroom, MD;  Location: [facility];  Service: Endoscopy;  Laterality: N/A;   ESOPHAGOGASTRODUODENOSCOPY (EGD) WITH PROPOFOL  N/A 08/01/2015   Procedure: ESOPHAGOGASTRODUODENOSCOPY (EGD) WITH PROPOFOL ;  Surgeon: Sergio Dandy, MD;  Location: [facility];  Service: Endoscopy;  Laterality: N/A;   ESOPHAGOGASTRODUODENOSCOPY (EGD) WITH PROPOFOL  N/A 11/07/2019   Procedure: ESOPHAGOGASTRODUODENOSCOPY (EGD) WITH PROPOFOL ;  Surgeon: Sergio Dandy, MD;  Location: [facility];  Service: Endoscopy;  Laterality: N/A;   EXTRACORPOREAL SHOCK WAVE LITHOTRIPSY Left    HERNIA REPAIR     HOT HEMOSTASIS  02/05/2012   Procedure: HOT HEMOSTASIS (ARGON PLASMA COAGULATION/BICAP);  Surgeon: Pietro Bridegroom, MD;  Location: [facility];  Service: Endoscopy;  Laterality: N/A;   KNEE ARTHROSCOPY Left    POLYPECTOMY  11/07/2019   Procedure: POLYPECTOMY;  Surgeon: Sergio Dandy, MD;  Location: [facility];  Service: Endoscopy;;   TUBAL LIGATION     UMBILICAL HERNIA REPAIR      Social History:  reports that she has never smoked. She has never been exposed to tobacco smoke. She has never used smokeless tobacco. She reports current alcohol use of about 1.0 standard drink of alcohol per week. She reports that she does not use drugs.   Allergies  Allergen Reactions   Neurontin [Gabapentin] Other (See Comments)    Dizziness   Codeine Rash   Fosamax [Alendronate Sodium] Other (See Comments)    Upset stomach    Nsaids Other (See Comments)    Abdominal Pain    Family History  Problem Relation Age of Onset   Heart disease Mother    Heart disease Father    Diabetes Sister    Stroke Brother    Hypertension Brother    Diabetes Brother    Cancer Brother        Renal   Cancer Brother        Prostate   Heart disease Brother    Heart disease Brother    Cancer Brother        Prostate, bladder   Diabetes Paternal Grandmother    Colon cancer Neg Hx        Prior to Admission medications   Medication Sig Start Date End Date Taking? Authorizing Provider  acetaminophen  (TYLENOL ) 325 MG tablet Take 650 mg by mouth every 6 (six) hours as needed for moderate pain or headache.    [provider]  ALPRAZolam  (XANAX ) 0.25 MG tablet Take 1 tablet (0.25 mg total) by mouth 2 (two) times daily as needed for anxiety. 03/30/23   Graig Lawyer, MD  apixaban  (ELIQUIS ) 5 MG TABS tablet Take 1 tablet (5 mg total) by mouth 2 (two) times daily. 10/08/22   Graig Lawyer, MD  atorvastatin  (LIPITOR) 20 MG tablet Take 1 tablet (20 mg total) by mouth daily. 04/27/23  Graig Lawyer, MD  desonide  (DESOWEN ) 0.05 % cream Apply 1 Application topically 2 (two) times daily as needed (dry skin/irritation). 10/08/22   Graig Lawyer, MD  dicyclomine  (BENTYL ) 20 MG tablet TAKE 1 TABLET(20 MG) BY MOUTH THREE TIMES DAILY AFTER MEALS Patient taking differently: Take 20 mg by mouth 2 (two) times daily. 05/19/17   Nandigam, Kavitha V, MD  DULoxetine  (CYMBALTA ) 60 MG capsule Take 1 capsule (60 mg total) by mouth daily. 11/18/22   Graig Lawyer, MD  furosemide  (LASIX ) 40 MG tablet Take 1 tablet (40 mg total) by mouth daily. 05/21/23 08/19/23  Jude Norton, NP  glipiZIDE  (GLUCOTROL  XL) 5 MG 24 hr tablet Take 1 tablet (5 mg total) by mouth daily. 04/27/23   Graig Lawyer, MD  HYDROcodone -acetaminophen  (NORCO) 10-325 MG tablet Take 1 tablet by mouth daily as needed for severe pain (pain score 7-10). 03/30/23   Graig Lawyer, MD  latanoprost  (XALATAN ) 0.005 % ophthalmic solution Place 1 drop into both eyes at bedtime.  12/05/11   [provider]  metFORMIN (GLUCOPHAGE) 1000 MG tablet Take 1,000 mg by mouth 2 (two) times daily. 09/24/22   [provider]  metoprolol  tartrate (LOPRESSOR ) 50 MG tablet Take 1 tablet (50 mg total) by mouth 2 (two) times daily. 10/08/22   Graig Lawyer, MD  Mouthwashes (BIOTENE/CALCIUM  MT) Take 1 Dose by mouth daily as needed (dry  mouth).     [provider]  Multiple Vitamin (MULTIVITAMIN WITH MINERALS) TABS tablet Take 1 tablet by mouth daily.    [provider]  omeprazole  (PRILOSEC) 40 MG capsule TAKE 1 CAPSULE(40 MG) BY MOUTH DAILY 04/20/23   Graig Lawyer, MD  pregabalin  (LYRICA ) 50 MG capsule TAKE 1 CAPSULE(50 MG) BY MOUTH TWICE DAILY 04/20/23   Graig Lawyer, MD  psyllium (METAMUCIL SMOOTH TEXTURE) 28 % packet Take 1 packet by mouth daily as needed (constipation).     [provider]  tirzepatide Florence Hunt) 5 MG/0.5ML Pen Inject 5 mg into the skin once a week. 02/13/23   Graig Lawyer, MD    Physical Exam: Pulse (!) 123   Temp (!) 101.6 F (38.7 C) (Oral)   Resp (!) 24   Ht 5\' 3"  (1.6 m)   Wt 117.9 kg   SpO2 98%   BMI 46.04 kg/m   General: 78 y.o. year-old female well developed well nourished in no acute distress.  Alert and oriented x3. Cardiovascular: Regular rate and rhythm with no rubs or gallops.  No thyromegaly or JVD noted.  No lower extremity edema. 2/4 pulses in all 4 extremities. Respiratory: Clear to auscultation with no wheezes or rales. Good inspiratory effort. Abdomen: Soft nontender nondistended with normal bowel sounds x4 quadrants. Muskuloskeletal: No cyanosis, clubbing or edema noted bilaterally Neuro: CN II-XII intact, strength, sensation, reflexes Skin: No ulcerative lesions noted or rashes Psychiatry: Judgement and insight appear normal. Mood is appropriate for condition and setting          Labs on Admission:  Basic Metabolic Panel: Recent Labs  Lab 06/01/23 1710  NA 132*  K 4.2  CL 93*  CO2 25  GLUCOSE 202*  BUN 22  CREATININE 1.12*  CALCIUM  9.2   Liver Function Tests: Recent Labs  Lab 06/01/23 1710  AST 49*  ALT 19  ALKPHOS 91  BILITOT 1.4*  PROT 7.2  ALBUMIN 3.1*   Recent Labs  Lab 06/01/23 1710  LIPASE 28   Recent Labs  Lab 06/01/23 1710  AMMONIA 28   CBC: Recent Labs  Lab 06/01/23 1710  WBC 10.0  NEUTROABS 8.8*   HGB 9.7*  HCT 31.4*  MCV 70.2*  PLT 205   Cardiac Enzymes: No results for input(s): "CKTOTAL", "CKMB", "CKMBINDEX", "TROPONINI" in the last 168 hours.  BNP (last 3 results) No results for input(s): "BNP" in the last 8760 hours.  ProBNP (last 3 results) Recent Labs    02/13/23 1456  PROBNP 507.0*    CBG: No results for input(s): "GLUCAP" in the last 168 hours.  Radiological Exams on Admission: CT Head Wo Contrast Result Date: 06/01/2023 CLINICAL DATA:  Mental status change EXAM: CT HEAD WITHOUT CONTRAST TECHNIQUE: Contiguous axial images were obtained from the base of the skull through the vertex without intravenous contrast. RADIATION DOSE REDUCTION: This exam was performed according to the departmental dose-optimization program which includes automated exposure control, adjustment of the mA and/or kV according to patient size and/or use of iterative reconstruction technique. COMPARISON:  MRI 10/06/2021, CT brain 10/06/2021 FINDINGS: Brain: No acute territorial infarction, hemorrhage or intracranial mass. Atrophy. Mild to moderate patchy white matter hypodensity. Stable ventricle size. Vascular: No hyperdense vessels.  Carotid vascular calcification Skull: Normal. Negative for fracture or focal lesion. Sinuses/Orbits: No acute finding. Other: None IMPRESSION: 1. No CT evidence for acute intracranial abnormality. 2. Atrophy and chronic small vessel ischemic changes of the white matter. Electronically Signed   By: Esmeralda Hedge M.D.   On: 06/01/2023 18:56   DG Chest Port 1 View Result Date: 06/01/2023 CLINICAL DATA:  Weakness EXAM: PORTABLE CHEST 1 VIEW COMPARISON:  11/23/2006 FINDINGS: Mild bronchitic changes. No consolidation or effusion. Upper normal cardiac size likely augmented by rotation. Aortic atherosclerosis. No pneumothorax IMPRESSION: Mild bronchitic changes. Electronically Signed   By: Esmeralda Hedge M.D.   On: 06/01/2023 18:53    EKG: I independently viewed the EKG done and  my findings are as followed: Atrial fibrillation rate of 125.  Nonspecific ST changes.  QTc 464.  Assessment/Plan Present on Admission:  Sepsis (HCC)  Principal Problem:   Sepsis (HCC)  Severe sepsis secondary to UTI, POA Presented with fever with Tmax 103.1, tachycardia, tachypnea, dysuria, UA positive for pyuria Follow urine culture, peripheral blood cultures x 2 for ID and sensitivities. Lactic acid ordered after admission returned at 5.5. Continue Rocephin  initiated in the ER Continue IV fluid per sepsis protocol De-escalate antibiotics as able Maintain MAP greater than 65 Closely monitor volume status while on IV fluid Addendum: IV antibiotics switched to IV cefepime  and IV vancomycin  due to severe sepsis.  Follow MRSA screening test.  De-escalate antibiotics as able.  Lactic acidosis secondary to severe sepsis Initial lactic acid 5.5 Trend lactic acid Continue IV fluid hydration per sepsis protocol.  Acute metabolic encephalopathy secondary to severe sepsis versus others Mentation is back to baseline Noncontrast CT head was nonacute Continue to treat severe sepsis Reorient as needed Fall precautions  Persistent atrial fibrillation with RVR Heart rates in the 120s Resume home p.o. Lopressor  at lowest dose to avoid hypotension Resume home Eliquis  for secondary CVA prevention Closely monitor on telemetry  Type 2 diabetes with hyperglycemia Start basal insulin  and short acting insulin  Heart healthy carb modified diet Last hemoglobin A1c 8.1 on 02/13/2023  Hyperlipidemia Resume home Lipitor  Chronic HFpEF Euvolemic on exam Monitor strict I's and O's and daily weight Closely monitor volume status while on IV fluid  Obesity BMI 46 The patient is on Mounjaro Recommending weight loss with healthy lifestyle, healthy dieting and  regular exercise  Generalized weakness PT OT evaluation Fall precautions.  OSA CPAP nightly   Critical care time: 55  minutes.   DVT prophylaxis: Home Eliquis   Code Status: Full code  Family Communication: Updated the patient's son at bedside  Disposition Plan: Admitted to telemetry medical unit  Consults called: None.  Admission status: Inpatient status.   Status is: Inpatient The patient requires at least 2 midnights for further evaluation and treatment of present condition.   Bary Boss MD [facility] Pager [phone]  If 7PM-7AM, please contact night-coverage [email or web address] Password [email or web address]  06/01/2023, 7:34 PM

## 2023-06-02 ENCOUNTER — Inpatient Hospital Stay (HOSPITAL_COMMUNITY): Payer: Medicare (Managed Care)

## 2023-06-02 ENCOUNTER — Encounter (HOSPITAL_COMMUNITY): Payer: Self-pay | Admitting: Internal Medicine

## 2023-06-02 DIAGNOSIS — N390 Urinary tract infection, site not specified: Secondary | ICD-10-CM | POA: Diagnosis not present

## 2023-06-02 DIAGNOSIS — A419 Sepsis, unspecified organism: Secondary | ICD-10-CM | POA: Diagnosis not present

## 2023-06-02 LAB — BLOOD CULTURE ID PANEL (REFLEXED) - BCID2

## 2023-06-02 LAB — COMPREHENSIVE METABOLIC PANEL WITH GFR
ALT: 17 U/L (ref 0–44)
AST: 29 U/L (ref 15–41)
Albumin: 2.6 g/dL — ABNORMAL LOW (ref 3.5–5.0)
Alkaline Phosphatase: 69 U/L (ref 38–126)
Anion gap: 11 (ref 5–15)
BUN: 23 mg/dL (ref 8–23)
CO2: 26 mmol/L (ref 22–32)
Calcium: 8.7 mg/dL — ABNORMAL LOW (ref 8.9–10.3)
Chloride: 96 mmol/L — ABNORMAL LOW (ref 98–111)
Creatinine, Ser: 1.02 mg/dL — ABNORMAL HIGH (ref 0.44–1.00)
GFR, Estimated: 56 mL/min — ABNORMAL LOW (ref >60)
Glucose, Bld: 69 mg/dL — ABNORMAL LOW (ref 70–99)
Potassium: 3.8 mmol/L (ref 3.5–5.1)
Sodium: 133 mmol/L — ABNORMAL LOW (ref 135–145)
Total Bilirubin: 1.1 mg/dL (ref 0.0–1.2)
Total Protein: 6.3 g/dL — ABNORMAL LOW (ref 6.5–8.1)

## 2023-06-02 LAB — GLUCOSE, CAPILLARY
Glucose-Capillary: 53 mg/dL — ABNORMAL LOW (ref 70–99)
Glucose-Capillary: 81 mg/dL (ref 70–99)
Glucose-Capillary: 22 mg/dL — CL (ref 70–99)
Glucose-Capillary: 89 mg/dL (ref 70–99)
Glucose-Capillary: 103 mg/dL — ABNORMAL HIGH (ref 70–99)
Glucose-Capillary: 145 mg/dL — ABNORMAL HIGH (ref 70–99)
Glucose-Capillary: 165 mg/dL — ABNORMAL HIGH (ref 70–99)

## 2023-06-02 LAB — CBC
HCT: 27.1 % — ABNORMAL LOW (ref 36.0–46.0)
Hemoglobin: 7.9 g/dL — ABNORMAL LOW (ref 12.0–15.0)
MCH: 21.1 pg — ABNORMAL LOW (ref 26.0–34.0)
MCHC: 29.2 g/dL — ABNORMAL LOW (ref 30.0–36.0)
MCV: 72.3 fL — ABNORMAL LOW (ref 80.0–100.0)
Platelets: 171 10*3/uL (ref 150–400)
RBC: 3.75 MIL/uL — ABNORMAL LOW (ref 3.87–5.11)
RDW: 18.7 % — ABNORMAL HIGH (ref 11.5–15.5)
WBC: 10.2 10*3/uL (ref 4.0–10.5)
nRBC: 0 % (ref 0.0–0.2)

## 2023-06-02 LAB — HEMOGLOBIN AND HEMATOCRIT, BLOOD
HCT: 27.8 % — ABNORMAL LOW (ref 36.0–46.0)
Hemoglobin: 8.1 g/dL — ABNORMAL LOW (ref 12.0–15.0)

## 2023-06-02 LAB — LACTIC ACID, PLASMA
Lactic Acid, Venous: 2.8 mmol/L (ref 0.5–1.9)
Lactic Acid, Venous: 2.7 mmol/L (ref 0.5–1.9)
Lactic Acid, Venous: 2.2 mmol/L (ref 0.5–1.9)

## 2023-06-02 LAB — IRON AND TIBC
Iron: 15 ug/dL — ABNORMAL LOW (ref 28–170)
Saturation Ratios: 5 % — ABNORMAL LOW (ref 10.4–31.8)
TIBC: 312 ug/dL (ref 250–450)
UIBC: 297 ug/dL

## 2023-06-02 LAB — VITAMIN B12: Vitamin B-12: 458 pg/mL (ref 180–914)

## 2023-06-02 LAB — RETICULOCYTES
Immature Retic Fract: 21.4 % — ABNORMAL HIGH (ref 2.3–15.9)
RBC.: 3.79 MIL/uL — ABNORMAL LOW (ref 3.87–5.11)
Retic Count, Absolute: 73.9 10*3/uL (ref 19.0–186.0)
Retic Ct Pct: 2 % (ref 0.4–3.1)

## 2023-06-02 LAB — FOLATE: Folate: 36 ng/mL (ref >5.9)

## 2023-06-02 LAB — MRSA NEXT GEN BY PCR, NASAL: MRSA by PCR Next Gen: NOT DETECTED

## 2023-06-02 LAB — MAGNESIUM: Magnesium: 1 mg/dL — ABNORMAL LOW (ref 1.7–2.4)

## 2023-06-02 MED ORDER — DEXTROSE 50 % IV SOLN
1.0000 | Freq: Once | INTRAVENOUS | Status: DC
  Filled 2023-06-02: qty 50

## 2023-06-02 MED ORDER — DULOXETINE HCL 60 MG PO CPEP
60.0000 mg | ORAL_CAPSULE | Freq: Every day | ORAL | Status: DC
  Administered 2023-06-02 – 2023-06-05 (×4): 60 mg via ORAL
  Filled 2023-06-02 (×4): qty 1

## 2023-06-02 MED ORDER — DEXTROSE-SODIUM CHLORIDE 5-0.9 % IV SOLN: INTRAVENOUS | Status: AC

## 2023-06-02 MED ORDER — DULOXETINE HCL 60 MG PO CPEP: 60.0000 mg | ORAL_CAPSULE | Freq: Every day | ORAL | Status: DC

## 2023-06-02 MED ORDER — SENNOSIDES-DOCUSATE SODIUM 8.6-50 MG PO TABS
2.0000 | ORAL_TABLET | Freq: Two times a day (BID) | ORAL | Status: DC
  Administered 2023-06-02 – 2023-06-05 (×6): 2 via ORAL
  Filled 2023-06-02 (×6): qty 2

## 2023-06-02 MED ORDER — POLYETHYLENE GLYCOL 3350 17 G PO PACK
17.0000 g | PACK | Freq: Every day | ORAL | Status: DC
  Administered 2023-06-03 – 2023-06-05 (×3): 17 g via ORAL
  Filled 2023-06-02 (×3): qty 1

## 2023-06-02 MED ORDER — MAGNESIUM SULFATE 4 GM/100ML IV SOLN
4.0000 g | Freq: Once | INTRAVENOUS | Status: AC
  Administered 2023-06-02: 4 g via INTRAVENOUS
  Filled 2023-06-02: qty 100

## 2023-06-02 MED ORDER — SODIUM CHLORIDE 0.9 % IV SOLN
2.0000 g | INTRAVENOUS | Status: DC
  Administered 2023-06-02 – 2023-06-03 (×2): 2 g via INTRAVENOUS
  Filled 2023-06-02 (×2): qty 20

## 2023-06-02 MED ORDER — PREGABALIN 25 MG PO CAPS: 50.0000 mg | ORAL_CAPSULE | Freq: Two times a day (BID) | ORAL | Status: DC

## 2023-06-02 MED ORDER — HYDROCODONE-ACETAMINOPHEN 10-325 MG PO TABS: 1.0000 | ORAL_TABLET | Freq: Four times a day (QID) | ORAL | Status: DC | PRN

## 2023-06-02 MED ORDER — DEXTROSE-SODIUM CHLORIDE 5-0.9 % IV SOLN: INTRAVENOUS | Status: DC

## 2023-06-02 MED ORDER — VANCOMYCIN HCL 1500 MG/300ML IV SOLN: 1500.0000 mg | INTRAVENOUS | Status: DC

## 2023-06-02 MED ORDER — ONDANSETRON HCL 4 MG/2ML IJ SOLN
4.0000 mg | Freq: Four times a day (QID) | INTRAMUSCULAR | Status: DC | PRN
Start: 2023-06-02 — End: 2023-06-06

## 2023-06-02 MED ORDER — LATANOPROST 0.005 % OP SOLN
1.0000 [drp] | Freq: Every day | OPHTHALMIC | Status: DC
  Administered 2023-06-02 – 2023-06-04 (×3): 1 [drp] via OPHTHALMIC
  Filled 2023-06-02: qty 2.5

## 2023-06-02 MED ORDER — PREGABALIN 25 MG PO CAPS
50.0000 mg | ORAL_CAPSULE | Freq: Two times a day (BID) | ORAL | Status: DC
Start: 2023-06-02 — End: 2023-06-02

## 2023-06-02 MED ORDER — PREGABALIN 25 MG PO CAPS
50.0000 mg | ORAL_CAPSULE | Freq: Two times a day (BID) | ORAL | Status: DC
Start: 2023-06-02 — End: 2023-06-06
  Administered 2023-06-02 – 2023-06-05 (×6): 50 mg via ORAL
  Filled 2023-06-02 (×6): qty 2

## 2023-06-02 MED ORDER — DICYCLOMINE HCL 20 MG PO TABS
20.0000 mg | ORAL_TABLET | Freq: Two times a day (BID) | ORAL | Status: DC
  Administered 2023-06-02 – 2023-06-05 (×6): 20 mg via ORAL
  Filled 2023-06-02 (×7): qty 1

## 2023-06-02 MED ORDER — APIXABAN 5 MG PO TABS
5.0000 mg | ORAL_TABLET | Freq: Two times a day (BID) | ORAL | Status: DC
  Administered 2023-06-02 – 2023-06-05 (×6): 5 mg via ORAL
  Filled 2023-06-02 (×6): qty 1

## 2023-06-02 MED ORDER — GLUCERNA SHAKE PO LIQD
237.0000 mL | Freq: Three times a day (TID) | ORAL | Status: DC
  Administered 2023-06-02 – 2023-06-05 (×9): 237 mL via ORAL

## 2023-06-02 MED ORDER — ALPRAZOLAM 0.25 MG PO TABS
0.2500 mg | ORAL_TABLET | Freq: Two times a day (BID) | ORAL | Status: DC | PRN
  Administered 2023-06-02 – 2023-06-03 (×2): 0.25 mg via ORAL
  Filled 2023-06-02 (×2): qty 1

## 2023-06-02 MED ORDER — PANTOPRAZOLE SODIUM 40 MG PO TBEC
40.0000 mg | DELAYED_RELEASE_TABLET | Freq: Every day | ORAL | Status: DC
  Administered 2023-06-02 – 2023-06-05 (×4): 40 mg via ORAL
  Filled 2023-06-02 (×4): qty 1

## 2023-06-02 MED ORDER — BISACODYL 10 MG RE SUPP: 10.0000 mg | Freq: Once | RECTAL | Status: DC

## 2023-06-02 MED ORDER — FERROUS SULFATE 325 (65 FE) MG PO TABS
325.0000 mg | ORAL_TABLET | Freq: Every day | ORAL | Status: DC
Start: 2023-06-03 — End: 2023-06-06
  Administered 2023-06-03 – 2023-06-05 (×3): 325 mg via ORAL
  Filled 2023-06-02 (×3): qty 1

## 2023-06-02 NOTE — Evaluation (Signed)
 Physical Therapy Evaluation Patient Details Name: Jade Ellison MRN: [id number] DOB: 23-May-1945 Today's Date: 06/02/2023  History of Present Illness  Patient is 78 y.o. female who presents to the ER from home due to AMS with dysuria. In ED UA positive for pyuria, pt tachycardic and Tmax of 103.1. 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   Clinical Impression  Jade Ellison is 78 y.o. female admitted with above HPI and diagnosis. Patient is currently limited by functional impairments below (see PT problem list). Patient lives with daughter and family and requires assist for all mobility and ADLs at bed level at baseline. Pt is able to transfer bed<>wc with assist from daughter. Patient will benefit from continued skilled PT interventions to address impairments and progress independence with mobility. Pt will benefit form additional DME and home services for increased assistance and support. A hospital bed and lift will improve pt and caregiver safety with functional transfers if pt returns home with 24/7 assist. Acute PT will follow and progress as able.         If plan is discharge home, recommend the following: Two people to help with walking and/or transfers;Two people to help with bathing/dressing/bathroom;Assistance with cooking/housework;Assist for transportation;Help with stairs or ramp for entrance   Can travel by private vehicle        Equipment Recommendations Hospital bed;Hoyer lift  Recommendations for Other Services       Functional Status Assessment Patient has had a recent decline in their functional status and/or demonstrates limited ability to make significant improvements in function in a reasonable and predictable amount of time     Precautions /  Restrictions Precautions Precautions: Fall Recall of Precautions/Restrictions: Intact Restrictions Weight Bearing Restrictions Per Provider Order: No      Mobility  Bed Mobility Overal bed mobility: Needs Assistance Bed Mobility: Supine to Sit, Sit to Supine     Supine to sit: Max assist, +2 for physical assistance, +2 for safety/equipment, HOB elevated, Used rails, Total assist Sit to supine: Max assist, Total assist, +2 for safety/equipment, +2 for physical assistance   General bed mobility comments: Max/Total +2 for all aspects with use of bed features.    Transfers                   General transfer comment: deferred - pt transfer to [facility] at baseline    Ambulation/Gait                  Stairs            Wheelchair Mobility     Tilt Bed    Modified Rankin (Stroke Patients Only)       Balance Overall balance assessment: Needs assistance Sitting-balance support: Feet supported, Bilateral upper extremity supported Sitting balance-Leahy Scale: Poor Sitting balance - Comments: posterior lean, difficulty placing feet on floor.                                     Pertinent Vitals/Pain Pain Assessment Pain Assessment: No/denies pain    Home Living Family/patient expects to be discharged to:: Private residence Living Arrangements: Children (daughter and her family live with pt) Available Help at Discharge: Family Type of Home: House Home Access: Ramped  entrance       Home Layout: One level Home Equipment: Wheelchair - power (adjustable bed, bed rails on Lt side of bed.) Additional Comments: daughter works from home, is available to assist pt with transfer to [facility] via squat pivot per pt report. no lift being used but pt thinks daughter may have ordered once recently.    Prior Function Prior Level of Function : Needs assist       Physical Assist : Mobility (physical);ADLs (physical) Mobility (physical): Bed  mobility;Transfers   Mobility Comments: daughter helps her sit on side of bed and holds her at her arms and waist and pivots pt to [facility] pt drive chair herself. ADLs Comments: uses a pad in underwear all ADLs at bed leve. wears pad in underwear and transfers to bed for toileting on disposbale pad.     Extremity/Trunk Assessment   Upper Extremity Assessment Upper Extremity Assessment: Defer to OT evaluation;Generalized weakness    Lower Extremity Assessment Lower Extremity Assessment: Generalized weakness    Cervical / Trunk Assessment Cervical / Trunk Assessment: Other exceptions Cervical / Trunk Exceptions: habitus  Communication   Communication Communication: No apparent difficulties    Cognition Arousal: Alert Behavior During Therapy: WFL for tasks assessed/performed   PT - Cognitive impairments: No apparent impairments                         Following commands: Intact       Cueing Cueing Techniques: Verbal cues     General Comments      Exercises     Assessment/Plan    PT Assessment Patient needs continued PT services  PT Problem List Decreased range of motion;Decreased strength;Decreased activity tolerance;Decreased balance;Decreased mobility;Decreased knowledge of use of DME;Decreased safety awareness;Obesity;Pain       PT Treatment Interventions DME instruction;Gait training;Stair training;Functional mobility training;Therapeutic activities;Therapeutic exercise;Balance training;Neuromuscular re-education;Cognitive remediation;Patient/family education;Wheelchair mobility training    PT Goals (Current goals can be found in the Care Plan section)  Acute Rehab PT Goals Patient Stated Goal: return home, hip not to hurt PT Goal Formulation: With patient Time For Goal Achievement: 06/16/23 Potential to Achieve Goals: Fair    Frequency Min 1X/week     Co-evaluation               AM-PAC PT "6 Clicks" Mobility  Outcome Measure Help needed  turning from your back to your side while in a flat bed without using bedrails?: Total Help needed moving from lying on your back to sitting on the side of a flat bed without using bedrails?: Total Help needed moving to and from a bed to a chair (including a wheelchair)?: Total Help needed standing up from a chair using your arms (e.g., wheelchair or bedside chair)?: Total Help needed to walk in hospital room?: Total Help needed climbing 3-5 steps with a railing? : Total 6 Click Score: 6    End of Session Equipment Utilized During Treatment: Gait belt Activity Tolerance: Patient tolerated treatment well Patient left: in bed;with call bell/phone within reach;with nursing/sitter in room Nurse Communication: Mobility status;Need for lift equipment PT Visit Diagnosis: Other abnormalities of gait and mobility (R26.89);Muscle weakness (generalized) (M62.81);Difficulty in walking, not elsewhere classified (R26.2);Pain Pain - Right/Left: Right Pain - part of body: Hip    Time: 1301-1322 PT Time Calculation (min) (ACUTE ONLY): 21 min   Charges:   PT Evaluation $PT Eval Moderate Complexity: 1 Mod   PT General Charges $$ ACUTE PT VISIT: 1  Visit         Tish Forge, DPT Acute Rehabilitation Services Office [phone]  06/02/23 3:38 PM

## 2023-06-02 NOTE — Progress Notes (Signed)
 Pharmacy Antibiotic Note  Jade Ellison is a 78 y.o. female admitted on 06/01/2023 with sepsis.  Pharmacy has been consulted for Vancomycin  dosing. WBC 10, Scr 1.12, lactic acid was up now trending down some.   Plan: Vancomycin  1500 mg IV q24h >>>Estimated AUC: 540 Cefepime  per MD Trend WBC, temp, renal function  F/U infectious work-up Drug levels as indicated   Height: 5\' 3"  (160 cm) Weight: 117.9 kg (259 lb 14.8 oz) IBW/kg (Calculated) : 52.4  Temp (24hrs), Avg:101 F (38.3 C), Min:98.2 F (36.8 C), Max:103.1 F (39.5 C)  Recent Labs  Lab 06/01/23 1710 06/01/23 2135 06/01/23 2337  WBC 10.0  --   --   CREATININE 1.12*  --   --   LATICACIDVEN  --  5.5* 2.8*    Estimated Creatinine Clearance: 51.4 mL/min (A) (by C-G formula based on SCr of 1.12 mg/dL (H)).    Allergies  Allergen Reactions   Neurontin [Gabapentin] Other (See Comments)    Dizziness   Codeine Rash   Fosamax [Alendronate Sodium] Other (See Comments)    Upset stomach    Nsaids Other (See Comments)    Abdominal Pain    Silvestre Drum, PharmD, BCPS Clinical Pharmacist Phone: [phone]

## 2023-06-02 NOTE — Progress Notes (Signed)
 PHARMACY - PHYSICIAN COMMUNICATION CRITICAL VALUE ALERT - BLOOD CULTURE IDENTIFICATION (BCID)  Jade Ellison is an 78 y.o. female who presented to [facility] on 06/01/2023 with a chief complaint of confusion/sepsis.  Assessment:  78 year old female with suspected urosepsis. Now with kleb pneumo in 1/4 blood cultures.   Name of physician (or Provider) Contacted: Charlean Congress   Current antibiotics: cefepime    Changes to prescribed antibiotics recommended:  Change to ceftriaxone  2 gm IV q 24 hours  Results for orders placed or performed during the hospital encounter of 06/01/23  Blood Culture ID Panel (Reflexed) (Collected: 06/01/2023  5:10 PM)  Result Value Ref Range   Enterococcus faecalis NOT DETECTED NOT DETECTED   Enterococcus Faecium NOT DETECTED NOT DETECTED   Listeria monocytogenes NOT DETECTED NOT DETECTED   Staphylococcus species NOT DETECTED NOT DETECTED   Staphylococcus aureus (BCID) NOT DETECTED NOT DETECTED   Staphylococcus epidermidis NOT DETECTED NOT DETECTED   Staphylococcus lugdunensis NOT DETECTED NOT DETECTED   Streptococcus species NOT DETECTED NOT DETECTED   Streptococcus agalactiae NOT DETECTED NOT DETECTED   Streptococcus pneumoniae NOT DETECTED NOT DETECTED   Streptococcus pyogenes NOT DETECTED NOT DETECTED   A.calcoaceticus-baumannii NOT DETECTED NOT DETECTED   Bacteroides fragilis NOT DETECTED NOT DETECTED   Enterobacterales DETECTED (A) NOT DETECTED   Enterobacter cloacae complex NOT DETECTED NOT DETECTED   Escherichia coli NOT DETECTED NOT DETECTED   Klebsiella aerogenes NOT DETECTED NOT DETECTED   Klebsiella oxytoca NOT DETECTED NOT DETECTED   Klebsiella pneumoniae DETECTED (A) NOT DETECTED   Proteus species NOT DETECTED NOT DETECTED   Salmonella species NOT DETECTED NOT DETECTED   Serratia marcescens NOT DETECTED NOT DETECTED   Haemophilus influenzae NOT DETECTED NOT DETECTED   Neisseria meningitidis NOT DETECTED NOT DETECTED   Pseudomonas  aeruginosa NOT DETECTED NOT DETECTED   Stenotrophomonas maltophilia NOT DETECTED NOT DETECTED   Candida albicans NOT DETECTED NOT DETECTED   Candida auris NOT DETECTED NOT DETECTED   Candida glabrata NOT DETECTED NOT DETECTED   Candida krusei NOT DETECTED NOT DETECTED   Candida parapsilosis NOT DETECTED NOT DETECTED   Candida tropicalis NOT DETECTED NOT DETECTED   Cryptococcus neoformans/gattii NOT DETECTED NOT DETECTED   CTX-M ESBL NOT DETECTED NOT DETECTED   Carbapenem resistance IMP NOT DETECTED NOT DETECTED   Carbapenem resistance KPC NOT DETECTED NOT DETECTED   Carbapenem resistance NDM NOT DETECTED NOT DETECTED   Carbapenem resist OXA 48 LIKE NOT DETECTED NOT DETECTED   Carbapenem resistance VIM NOT DETECTED NOT DETECTED    Denson Flake, PharmD, BCPS, BCIDP Infectious Diseases Clinical Pharmacist Phone: [phone] 06/02/2023  2:39 PM

## 2023-06-02 NOTE — Progress Notes (Signed)
 [facility] Progress Note Patient: Jade Ellison WUJ:[id number] DOB: 1945-01-26 DOA: 06/01/2023  DOS: the patient was seen and examined on 06/02/2023  Brief Hospital Course: PMH of HLD, HTN, OSA on CPAP, CKD 3A, type II DM, obesity, chronic AF on Eliquis  presented to the hospital with confusion. Patient was having gradual decline for last 230 days with some dizziness and lightheadedness as well as excessive sleepiness for the last 2 to 3 weeks with change in her sleep cycle pattern from nights to days. Patient was at her baseline when she woke up but during the day becomes confused when utilizing a bottle at work with the TV.  As well as some concern for stroke patient was brought to the hospital. Currently found to have Klebsiella bacteremia causing sepsis due to UTI.  Assessment and Plan: Severe sepsis secondary to Klebsiella bacteremia due to UTI. On admission temperature 103, hide tachycardia and tachypnea.  UA positive for pyuria.  Lactic acid was 5. Concern for a UTI patient was started on IV cefepime . Blood cultures came back positive for Klebsiella.  Now switched back to Rocephin . Aggressively treated with IV hydration.  Blood pressure improving. CT abdomen ordered as patient has suprapubic tenderness.  Acute metabolic encephalopathy. Primary reason for presentation was confusion. Initial concern for stroke.  CT of the head was done which is negative. Other metabolic workup also unremarkable. Suspect this is all UTI. Monitor for improvement patient already at baseline.  Persistent A-fib. Had some RVR. On Lopressor  which is resumed now. On Eliquis  which was continued. Monitor on telemetry.  Type II DM, uncontrolled with hyperglycemia with long-term insulin  use with HLD. Continue sliding scale insulin  for now as the patient is actually hypoglycemic. Discontinue long-acting insulin .  Chronic HFpEF. Has some swelling in her leg.  Possible third spacing with intravascular  volume depletion causing presentation with mild AKI. On Lasix  currently on hold. May require IV albumin followed by Lasix  if volume status worsens.  OSA. Continue CPAP nightly.  Obesity Class 3 Body mass index is 49.75 kg/m.  Placing the pt at higher risk of poor outcomes.  Bilateral hip pain knee pain. Likely arthritis. Monitor.  Chronic constipation with abdominal pain. Will continue Bentyl . CT abdomen ordered.  Monitor bowel stool burden.  HLD. Continue statin.  Chronic diabetic neuropathy Continue lyrica   Subjective: Mentation better.  Reports fatigue and tiredness.  No nausea or vomiting.  No diarrhea.  Actually has constipation.  Reports bilateral hip pain as well.  Physical Exam: General: in Mild distress, No Rash Cardiovascular: S1 and S2 Present, No Murmur Respiratory: Good respiratory effort, Bilateral Air entry present. No Crackles, No wheezes Abdomen: Bowel Sound present, suprapubic tenderness Extremities: Trace edema, difficulty moving bilateral lower extremity secondary to pain in hip and knee area. Neuro: Alert and oriented x3, no new focal deficit.  No asterixis.  Data Reviewed: I have Reviewed nursing notes, Vitals, and Lab results. Since last encounter, pertinent lab results CBC and BMP   . I have ordered test including CBC and BMP  . I have ordered imaging CT abdomen  .   Disposition: Status is: Inpatient Remains inpatient appropriate because: Monitor for improvement in ADHF.  And infection apixaban  (ELIQUIS ) tablet 5 mg   Family Communication: Family at bedside Level of care: Telemetry Medical   Vitals:   06/02/23 0733 06/02/23 0914 06/02/23 1200 06/02/23 1452  BP: 122/71  117/61 130/69  Pulse: 89  99 89  Resp: 18  18 17   Temp: 98.1 F (36.7 C)  97.7 F (36.5 C) 97.7 F (36.5 C)  TempSrc:   Oral Oral  SpO2: 100% 98% 98% 99%  Weight:      Height:         Author: Charlean Congress, MD 06/02/2023 7:39 PM  Please look on [email or web address] to  find out who is on call.

## 2023-06-02 NOTE — Plan of Care (Signed)
  Problem: Skin Integrity: Goal: Risk for impaired skin integrity will decrease Outcome: Progressing   Problem: Pain Managment: Goal: General experience of comfort will improve and/or be controlled Outcome: Progressing   Problem: Safety: Goal: Ability to remain free from injury will improve Outcome: Progressing

## 2023-06-03 ENCOUNTER — Inpatient Hospital Stay (HOSPITAL_COMMUNITY): Payer: Medicare (Managed Care)

## 2023-06-03 DIAGNOSIS — A419 Sepsis, unspecified organism: Secondary | ICD-10-CM | POA: Diagnosis not present

## 2023-06-03 LAB — CBC WITH DIFFERENTIAL/PLATELET
Abs Immature Granulocytes: 0.03 10*3/uL (ref 0.00–0.07)
Basophils Absolute: 0 10*3/uL (ref 0.0–0.1)
Basophils Relative: 0 %
Eosinophils Absolute: 0.1 10*3/uL (ref 0.0–0.5)
Eosinophils Relative: 1 %
HCT: 27.6 % — ABNORMAL LOW (ref 36.0–46.0)
Hemoglobin: 8 g/dL — ABNORMAL LOW (ref 12.0–15.0)
Immature Granulocytes: 1 %
Lymphocytes Relative: 9 %
Lymphs Abs: 0.6 10*3/uL — ABNORMAL LOW (ref 0.7–4.0)
MCH: 21.3 pg — ABNORMAL LOW (ref 26.0–34.0)
MCHC: 29 g/dL — ABNORMAL LOW (ref 30.0–36.0)
MCV: 73.4 fL — ABNORMAL LOW (ref 80.0–100.0)
Monocytes Absolute: 0.6 10*3/uL (ref 0.1–1.0)
Monocytes Relative: 10 %
Neutro Abs: 4.9 10*3/uL (ref 1.7–7.7)
Neutrophils Relative %: 79 %
Platelets: 153 10*3/uL (ref 150–400)
RBC: 3.76 MIL/uL — ABNORMAL LOW (ref 3.87–5.11)
RDW: 18.6 % — ABNORMAL HIGH (ref 11.5–15.5)
WBC: 6.2 10*3/uL (ref 4.0–10.5)
nRBC: 0 % (ref 0.0–0.2)

## 2023-06-03 LAB — BASIC METABOLIC PANEL WITH GFR
Anion gap: 11 (ref 5–15)
BUN: 21 mg/dL (ref 8–23)
CO2: 25 mmol/L (ref 22–32)
Calcium: 8.5 mg/dL — ABNORMAL LOW (ref 8.9–10.3)
Chloride: 98 mmol/L (ref 98–111)
Creatinine, Ser: 0.9 mg/dL (ref 0.44–1.00)
GFR, Estimated: >60 mL/min (ref >60)
Glucose, Bld: 148 mg/dL — ABNORMAL HIGH (ref 70–99)
Potassium: 3.6 mmol/L (ref 3.5–5.1)
Sodium: 134 mmol/L — ABNORMAL LOW (ref 135–145)

## 2023-06-03 LAB — GLUCOSE, CAPILLARY
Glucose-Capillary: 140 mg/dL — ABNORMAL HIGH (ref 70–99)
Glucose-Capillary: 219 mg/dL — ABNORMAL HIGH (ref 70–99)
Glucose-Capillary: 190 mg/dL — ABNORMAL HIGH (ref 70–99)

## 2023-06-03 LAB — URINE CULTURE

## 2023-06-03 LAB — HEMOGLOBIN A1C
Hgb A1c MFr Bld: 6.5 % — ABNORMAL HIGH (ref 4.8–5.6)
Mean Plasma Glucose: 139.85 mg/dL

## 2023-06-03 LAB — LACTIC ACID, PLASMA
Lactic Acid, Venous: 2.3 mmol/L (ref 0.5–1.9)
Lactic Acid, Venous: 2.5 mmol/L (ref 0.5–1.9)

## 2023-06-03 LAB — MAGNESIUM: Magnesium: 1.9 mg/dL (ref 1.7–2.4)

## 2023-06-03 MED ORDER — IOHEXOL 350 MG/ML SOLN
125.0000 mL | Freq: Once | INTRAVENOUS | Status: AC | PRN
  Administered 2023-06-03: 125 mL via INTRAVENOUS

## 2023-06-03 MED ORDER — SODIUM CHLORIDE 0.9 % IV BOLUS
1000.0000 mL | Freq: Once | INTRAVENOUS | Status: AC
  Administered 2023-06-03: 1000 mL via INTRAVENOUS

## 2023-06-03 MED ORDER — BISACODYL 10 MG RE SUPP: 10.0000 mg | Freq: Once | RECTAL | Status: DC

## 2023-06-03 NOTE — Progress Notes (Signed)
 PROGRESS NOTE    Jade Ellison  WUJ:[id number] DOB: Sep 14, 1945 DOA: 06/01/2023 PCP: Graig Lawyer, MD   Brief Narrative:  Jade Ellison is a 78 y.o. female with PMH of HLD, HTN, OSA on CPAP, CKD 3A, type II DM, obesity, chronic AF on Eliquis  presented to the hospital with confusion. Patient was having gradual decline for last few days with some dizziness and lightheadedness as well as excessive sleepiness for the last 2 to 3 weeks with change in her sleep cycle pattern from nights to days.  Eventually found to have Klebsiella pneumonia bacteremia and UTI.  Assessment & Plan:   Principal Problem:   Sepsis (HCC)  Severe sepsis secondary to Klebsiella bacteremia due to UTI/right pyelonephritis, POA:. Met severe sepsis criteria on admission based on temperature 103, hide tachycardia and tachypnea.  UA positive for pyuria.  Lactic acid was 5.  Started on cefepime  but switched to Rocephin  due to Klebsiella pneumonia positive blood culture.  Due to abdominal pain, CT abdomen pelvis was obtained which indicates right pyelonephritis, findings may represent blood products or calcification but neoplasm is not excluded so further evaluation with CT urogram is recommended.  She also appears to have hyperdense area in the inferior pole of the left kidney.  I have now ordered CT renal with and without contrast.  Patient's lactic acid was 5.5 which improved but is still elevated up to 2.3.  Will give another fluid bolus of 1 L and repeat lactic acid later.   Acute metabolic encephalopathy, POA: This was likely secondary to severe sepsis.  Patient is fully alert and oriented, encephalopathy has resolved.   Persistent A-fib, POA: Had some RVR in the beginning, her Lopressor  and Eliquis  was resumed, rates are controlled, monitor on telemetry.   Type II DM, uncontrolled with hyperglycemia with long-term insulin  use with HLD. Last hemoglobin A1c in January 2025 was 8.1.  Patient appears to be only on  glipizide  and metformin but no insulin .  She was initially started on long-acting insulin  but due to hypoglycemia, that was discontinued, currently only on SSI, blood sugar fairly controlled except this morning when she was 219.  Continue SSI for now.   Bilateral knee pain and right hip pain: Patient and daughter both concerned about fracture stating that patient has severe osteoporosis and she is at high risk of fractures.  Although there is no history of trauma and on examination, all the joints appear normal and has good range of motion but they have insisted that I order x-rays of bilateral knee and right hip to rule out fracture. PS: Patient is chronically bedbound at baseline.   Chronic HFpEF. Has some swelling in her leg.  Possible third spacing with intravascular volume depletion causing presentation with mild AKI.  Lasix  is on hold for now.   OSA. Continue CPAP nightly.   Morbid obesity Class 3 Body mass index is 49.75 kg/m.  Placing the pt at higher risk of poor outcomes.  Weight loss and diet modification counseled.   Bilateral hip pain knee pain. Likely arthritis. Monitor.   Chronic constipation with abdominal pain. Will continue Bentyl .  Dulcolax suppository.   HLD. Continue statin.   Chronic diabetic neuropathy Continue lyrica   DVT prophylaxis: Eliquis    Code Status: Full Code  Family Communication: Daughter present at bedside.  Plan of care discussed with patient in length and he/she verbalized understanding and agreed with it.  Status is: Inpatient Remains inpatient appropriate because: Needs more workup.  Still elevated lactic acid.  Estimated body mass index is 50.77 kg/m as calculated from the following:   Height as of this encounter: 5\' 3"  (1.6 m).   Weight as of this encounter: 130 kg.  Pressure Injury 06/01/23 Buttocks Stage 2 -  Partial thickness loss of dermis presenting as a shallow open injury with a red, pink wound bed without slough. (Active)   06/01/23 2311  Location: Buttocks  Location Orientation:   Staging: Stage 2 -  Partial thickness loss of dermis presenting as a shallow open injury with a red, pink wound bed without slough.  Wound Description (Comments):   Present on Admission: Yes   Nutritional Assessment: Body mass index is 50.77 kg/m.Jade Ellison Seen by dietician.  I agree with the assessment and plan as outlined below: Nutrition Status:        . Skin Assessment: I have examined the patient's skin and I agree with the wound assessment as performed by the wound care RN as outlined below: Pressure Injury 06/01/23 Buttocks Stage 2 -  Partial thickness loss of dermis presenting as a shallow open injury with a red, pink wound bed without slough. (Active)  06/01/23 2311  Location: Buttocks  Location Orientation:   Staging: Stage 2 -  Partial thickness loss of dermis presenting as a shallow open injury with a red, pink wound bed without slough.  Wound Description (Comments):   Present on Admission: Yes    Consultants:  None  Procedures:  None  Antimicrobials:  Anti-infectives (From admission, onward)    Start     Dose/Rate Route Frequency Ordered Stop   06/02/23 2200  vancomycin  (VANCOREADY) IVPB 1500 mg/300 mL  Status:  Discontinued        1,500 mg 150 mL/hr over 120 Minutes Intravenous Every 24 hours 06/02/23 0144 06/02/23 0755   06/02/23 2000  cefTRIAXone  (ROCEPHIN ) 2 g in sodium chloride  0.9 % 100 mL IVPB        2 g 200 mL/hr over 30 Minutes Intravenous Every 24 hours 06/02/23 1439     06/02/23 0030  vancomycin  (VANCOREADY) IVPB 2000 mg/400 mL        2,000 mg 200 mL/hr over 120 Minutes Intravenous  Once 06/01/23 2338 06/02/23 0330   06/01/23 2345  ceFEPIme  (MAXIPIME ) 2 g in sodium chloride  0.9 % 100 mL IVPB  Status:  Discontinued        2 g 200 mL/hr over 30 Minutes Intravenous Every 12 hours 06/01/23 2338 06/02/23 1439   06/01/23 1930  cefTRIAXone  (ROCEPHIN ) 1 g in sodium chloride  0.9 % 100 mL IVPB         1 g 200 mL/hr over 30 Minutes Intravenous  Once 06/01/23 1928 06/01/23 2017   06/01/23 1715  ceFEPIme  (MAXIPIME ) 2 g in sodium chloride  0.9 % 100 mL IVPB  Status:  Discontinued        2 g 200 mL/hr over 30 Minutes Intravenous  Once 06/01/23 1710 06/01/23 1928   06/01/23 1715  metroNIDAZOLE  (FLAGYL ) IVPB 500 mg  Status:  Discontinued        500 mg 100 mL/hr over 60 Minutes Intravenous  Once 06/01/23 1710 06/01/23 1928   06/01/23 1715  vancomycin  (VANCOCIN ) IVPB 1000 mg/200 mL premix  Status:  Discontinued        1,000 mg 200 mL/hr over 60 Minutes Intravenous  Once 06/01/23 1710 06/01/23 1711   06/01/23 1715  vancomycin  (VANCOCIN ) 2,250 mg in sodium chloride  0.9 % 500 mL IVPB  Status:  Discontinued  2,250 mg 261.3 mL/hr over 120 Minutes Intravenous  Once 06/01/23 1711 06/01/23 1928         Subjective: Patient seen and examined, daughter at the bedside.  Patient complains of bilateral knee pain right hip pain.  Otherwise she is fully alert and oriented and has no other complaint.  Objective: Vitals:   06/02/23 2103 06/02/23 2255 06/03/23 0620 06/03/23 0812  BP: 131/74  (!) 154/77 137/68  Pulse: 94 90 99 88  Resp: 18 (!) 21 18 16   Temp: 98 F (36.7 C)  97.6 F (36.4 C) 98.1 F (36.7 C)  TempSrc: Oral  Oral Oral  SpO2: 100% 100% 98% 98%  Weight:   130 kg   Height:        Intake/Output Summary (Last 24 hours) at 06/03/2023 1330 Last data filed at 06/03/2023 1220 Gross per 24 hour  Intake 3710.99 ml  Output 1550 ml  Net 2160.99 ml   Filed Weights   06/01/23 1656 06/02/23 0500 06/03/23 0620  Weight: 117.9 kg 127.4 kg 130 kg    Examination:  General exam: Appears calm and comfortable, morbidly obese Respiratory system: Clear to auscultation. Respiratory effort normal. Cardiovascular system: S1 & S2 heard, RRR. No JVD, murmurs, rubs, gallops or clicks.  +1 pitting edema left lower extremity and +2 pitting edema right lower extremity. Gastrointestinal system: Abdomen  is nondistended, soft and nontender. No organomegaly or masses felt. Normal bowel sounds heard. Central nervous system: Alert and oriented. No focal neurological deficits. Extremities: Symmetric 5 x 5 power. Skin: No rashes, lesions or ulcers Psychiatry: Judgement and insight appear normal. Mood & affect appropriate.    Data Reviewed: I have personally reviewed following labs and imaging studies  CBC: Recent Labs  Lab 06/01/23 1710 06/02/23 0839 06/02/23 1227 06/03/23 0908  WBC 10.0 10.2  --  6.2  NEUTROABS 8.8*  --   --  4.9  HGB 9.7* 7.9* 8.1* 8.0*  HCT 31.4* 27.1* 27.8* 27.6*  MCV 70.2* 72.3*  --  73.4*  PLT 205 171  --  153   Basic Metabolic Panel: Recent Labs  Lab 06/01/23 1710 06/02/23 0840 06/03/23 0908  NA 132* 133* 134*  K 4.2 3.8 3.6  CL 93* 96* 98  CO2 25 26 25   GLUCOSE 202* 69* 148*  BUN 22 23 21   CREATININE 1.12* 1.02* 0.90  CALCIUM  9.2 8.7* 8.5*  MG  --  1.0* 1.9   GFR: Estimated Creatinine Clearance: 67.8 mL/min (by C-G formula based on SCr of 0.9 mg/dL). Liver Function Tests: Recent Labs  Lab 06/01/23 1710 06/02/23 0840  AST 49* 29  ALT 19 17  ALKPHOS 91 69  BILITOT 1.4* 1.1  PROT 7.2 6.3*  ALBUMIN 3.1* 2.6*   Recent Labs  Lab 06/01/23 1710  LIPASE 28   Recent Labs  Lab 06/01/23 1710  AMMONIA 28   Coagulation Profile: No results for input(s): "INR", "PROTIME" in the last 168 hours. Cardiac Enzymes: No results for input(s): "CKTOTAL", "CKMB", "CKMBINDEX", "TROPONINI" in the last 168 hours. BNP (last 3 results) Recent Labs    02/13/23 1456  PROBNP 507.0*   HbA1C: No results for input(s): "HGBA1C" in the last 72 hours. CBG: Recent Labs  Lab 06/02/23 1214 06/02/23 1704 06/02/23 2107 06/03/23 0843 06/03/23 1138  GLUCAP 103* 145* 165* 140* 219*   Lipid Profile: No results for input(s): "CHOL", "HDL", "LDLCALC", "TRIG", "CHOLHDL", "LDLDIRECT" in the last 72 hours. Thyroid  Function Tests: Recent Labs    06/01/23 1710   TSH  1.980   Anemia Panel: Recent Labs    06/02/23 0840 06/02/23 1215  VITAMINB12 458  --   FOLATE 36.0  --   TIBC 312  --   IRON  15*  --   RETICCTPCT  --  2.0   Sepsis Labs: Recent Labs  Lab 06/01/23 2337 06/02/23 0840 06/02/23 1215 06/03/23 1122  LATICACIDVEN 2.8* 2.7* 2.2* 2.3*    Recent Results (from the past 240 hours)  Culture, blood (routine x 2)     Status: Abnormal (Preliminary result)   Collection Time: 06/01/23  5:10 PM   Specimen: BLOOD  Result Value Ref Range Status   Specimen Description BLOOD LEFT ANTECUBITAL  Final   Special Requests   Final    BOTTLES DRAWN AEROBIC AND ANAEROBIC Blood Culture results may not be optimal due to an inadequate volume of blood received in culture bottles   Culture  Setup Time   Final    GRAM NEGATIVE RODS AEROBIC BOTTLE ONLY CRITICAL RESULT CALLED TO, READ BACK BY AND VERIFIED WITH: PHARMD E SINCLAIRE [id number] AT 1420 BY CM    Culture (A)  Final    KLEBSIELLA PNEUMONIAE SUSCEPTIBILITIES TO FOLLOW Performed at [facility], [address] [place], [place] [address]    Report Status PENDING  Incomplete  Blood Culture ID Panel (Reflexed)     Status: Abnormal   Collection Time: 06/01/23  5:10 PM  Result Value Ref Range Status   Enterococcus faecalis NOT DETECTED NOT DETECTED Final   Enterococcus Faecium NOT DETECTED NOT DETECTED Final   Listeria monocytogenes NOT DETECTED NOT DETECTED Final   Staphylococcus species NOT DETECTED NOT DETECTED Final   Staphylococcus aureus (BCID) NOT DETECTED NOT DETECTED Final   Staphylococcus epidermidis NOT DETECTED NOT DETECTED Final   Staphylococcus lugdunensis NOT DETECTED NOT DETECTED Final   Streptococcus species NOT DETECTED NOT DETECTED Final   Streptococcus agalactiae NOT DETECTED NOT DETECTED Final   Streptococcus pneumoniae NOT DETECTED NOT DETECTED Final   Streptococcus pyogenes NOT DETECTED NOT DETECTED Final   A.calcoaceticus-baumannii NOT DETECTED NOT DETECTED Final    Bacteroides fragilis NOT DETECTED NOT DETECTED Final   Enterobacterales DETECTED (A) NOT DETECTED Final    Comment: Enterobacterales represent a large order of gram negative bacteria, not a single organism. CRITICAL RESULT CALLED TO, READ BACK BY AND VERIFIED WITH: PHARMD E SINCLAIRE [id number] AT 1420 BY CM    Enterobacter cloacae complex NOT DETECTED NOT DETECTED Final   Escherichia coli NOT DETECTED NOT DETECTED Final   Klebsiella aerogenes NOT DETECTED NOT DETECTED Final   Klebsiella oxytoca NOT DETECTED NOT DETECTED Final   Klebsiella pneumoniae DETECTED (A) NOT DETECTED Final    Comment: CRITICAL RESULT CALLED TO, READ BACK BY AND VERIFIED WITH: PHARMD E SINCLAIRE [id number] AT 1420 BY CM    Proteus species NOT DETECTED NOT DETECTED Final   Salmonella species NOT DETECTED NOT DETECTED Final   Serratia marcescens NOT DETECTED NOT DETECTED Final   Haemophilus influenzae NOT DETECTED NOT DETECTED Final   Neisseria meningitidis NOT DETECTED NOT DETECTED Final   Pseudomonas aeruginosa NOT DETECTED NOT DETECTED Final   Stenotrophomonas maltophilia NOT DETECTED NOT DETECTED Final   Candida albicans NOT DETECTED NOT DETECTED Final   Candida auris NOT DETECTED NOT DETECTED Final   Candida glabrata NOT DETECTED NOT DETECTED Final   Candida krusei NOT DETECTED NOT DETECTED Final   Candida parapsilosis NOT DETECTED NOT DETECTED Final   Candida tropicalis NOT DETECTED NOT DETECTED Final   Cryptococcus neoformans/gattii  NOT DETECTED NOT DETECTED Final   CTX-M ESBL NOT DETECTED NOT DETECTED Final   Carbapenem resistance IMP NOT DETECTED NOT DETECTED Final   Carbapenem resistance KPC NOT DETECTED NOT DETECTED Final   Carbapenem resistance NDM NOT DETECTED NOT DETECTED Final   Carbapenem resist OXA 48 LIKE NOT DETECTED NOT DETECTED Final   Carbapenem resistance VIM NOT DETECTED NOT DETECTED Final    Comment: Performed at [facility], [address] [place], [place] [address]  Culture,  blood (routine x 2)     Status: None (Preliminary result)   Collection Time: 06/01/23  5:21 PM   Specimen: BLOOD  Result Value Ref Range Status   Specimen Description BLOOD SITE NOT SPECIFIED  Final   Special Requests   Final    BOTTLES DRAWN AEROBIC AND ANAEROBIC Blood Culture adequate volume   Culture   Final    NO GROWTH 2 DAYS Performed at [facility], [address], [place], [place] [address]    Report Status PENDING  Incomplete  Urine Culture     Status: Abnormal   Collection Time: 06/01/23  6:34 PM   Specimen: Urine, Random  Result Value Ref Range Status   Specimen Description URINE, RANDOM  Final   Special Requests   Final    NONE Reflexed from [id number] Performed at [facility], [address], [place], [place] [address]    Culture MULTIPLE SPECIES PRESENT, SUGGEST RECOLLECTION (A)  Final   Report Status 06/03/2023 FINAL  Final  MRSA Next Gen by PCR, Nasal     Status: None   Collection Time: 06/02/23 12:22 AM   Specimen: Nasal Mucosa; Nasal Swab  Result Value Ref Range Status   MRSA by PCR Next Gen NOT DETECTED NOT DETECTED Final    Comment: (NOTE) The GeneXpert MRSA Assay (FDA approved for NASAL specimens only), is one component of a comprehensive MRSA colonization surveillance program. It is not intended to diagnose MRSA infection nor to guide or monitor treatment for MRSA infections. Test performance is not FDA approved in patients less than 58 years old. Performed at [facility], [address], [place], [place] [address]      Radiology Studies: CT ABDOMEN PELVIS WO CONTRAST Result Date: 06/02/2023 CLINICAL DATA:  UTI EXAM: CT ABDOMEN AND PELVIS WITHOUT CONTRAST TECHNIQUE: Multidetector CT imaging of the abdomen and pelvis was performed following the standard protocol without IV contrast. RADIATION DOSE REDUCTION: This exam was performed according to the departmental dose-optimization program which includes automated exposure control,  adjustment of the mA and/or kV according to patient size and/or use of iterative reconstruction technique. COMPARISON:  CT stone 03/20/2008 FINDINGS: Lower chest: No acute abnormality. Hepatobiliary: No focal liver abnormality is seen. Status post cholecystectomy. No biliary dilatation. Pancreas: Unremarkable. No pancreatic ductal dilatation or surrounding inflammatory changes. Spleen: Normal in size without focal abnormality. Adrenals/Urinary Tract: There is a vague hyperdensity within the right renal pelvis. The right renal pelvis is dilated with surrounding inflammatory stranding. Hyperdense area measures approximately 1.7 by by 1.9 x 1.0 cm. There is no hydronephrosis in either kidney. Subcentimeter hyperdense area in the inferior pole left kidney is indeterminate. The adrenal glands and bladder are within normal limits. Stomach/Bowel: Stomach is within normal limits. Appendix appears normal. No evidence of bowel wall thickening, distention, or inflammatory changes. There is sigmoid colon diverticulosis. There is a large amount of stool in the rectum. Vascular/Lymphatic: Aortic atherosclerosis. No enlarged abdominal or pelvic lymph nodes. Reproductive:  The uterus and left adnexa are within normal limits. Calcific density is seen in the right adnexa/right ovary measuring 4.7 x 3.3 cm similar to 2010. Other: There is no ascites. There is a small fat containing umbilical hernia. There is mild body wall edema. Musculoskeletal: Degenerative changes affect the spine and hips. IMPRESSION: 1. Vague hyperdensity within the right renal pelvis with surrounding inflammatory stranding. Findings may represent blood products or calcification, but neoplasm is not excluded. Recommend further evaluation with CT urogram. 2. Subcentimeter hyperdense area in the inferior pole left kidney is indeterminate. This can be further evaluated with CT urogram. 3. Sigmoid colon diverticulosis. 4. Large amount of stool in the rectum. 5. Mild  body wall edema. 6. Aortic atherosclerosis. Aortic Atherosclerosis (ICD10-I70.0). Electronically Signed   By: Tyron Gallon M.D.   On: 06/02/2023 20:01   CT Head Wo Contrast Result Date: 06/01/2023 CLINICAL DATA:  Mental status change EXAM: CT HEAD WITHOUT CONTRAST TECHNIQUE: Contiguous axial images were obtained from the base of the skull through the vertex without intravenous contrast. RADIATION DOSE REDUCTION: This exam was performed according to the departmental dose-optimization program which includes automated exposure control, adjustment of the mA and/or kV according to patient size and/or use of iterative reconstruction technique. COMPARISON:  MRI 10/06/2021, CT brain 10/06/2021 FINDINGS: Brain: No acute territorial infarction, hemorrhage or intracranial mass. Atrophy. Mild to moderate patchy white matter hypodensity. Stable ventricle size. Vascular: No hyperdense vessels.  Carotid vascular calcification Skull: Normal. Negative for fracture or focal lesion. Sinuses/Orbits: No acute finding. Other: None IMPRESSION: 1. No CT evidence for acute intracranial abnormality. 2. Atrophy and chronic small vessel ischemic changes of the white matter. Electronically Signed   By: Esmeralda Hedge M.D.   On: 06/01/2023 18:56   DG Chest Port 1 View Result Date: 06/01/2023 CLINICAL DATA:  Weakness EXAM: PORTABLE CHEST 1 VIEW COMPARISON:  11/23/2006 FINDINGS: Mild bronchitic changes. No consolidation or effusion. Upper normal cardiac size likely augmented by rotation. Aortic atherosclerosis. No pneumothorax IMPRESSION: Mild bronchitic changes. Electronically Signed   By: Esmeralda Hedge M.D.   On: 06/01/2023 18:53    Scheduled Meds:  apixaban   5 mg Oral BID   atorvastatin   20 mg Oral Daily   bisacodyl  10 mg Rectal Once   dicyclomine   20 mg Oral BID   DULoxetine   60 mg Oral Daily   feeding supplement (GLUCERNA SHAKE)  237 mL Oral TID BM   ferrous sulfate   325 mg Oral Q breakfast   insulin  aspart  0-5 Units  Subcutaneous QHS   insulin  aspart  0-9 Units Subcutaneous TID WC   latanoprost   1 drop Both Eyes QHS   metoprolol  tartrate  12.5 mg Oral BID   pantoprazole   40 mg Oral Daily   polyethylene glycol  17 g Oral Daily   pregabalin   50 mg Oral BID   senna-docusate  2 tablet Oral BID   Continuous Infusions:  cefTRIAXone  (ROCEPHIN )  IV Stopped (06/02/23 2207)   sodium chloride        LOS: 2 days   Modena Andes, MD [facility]  06/03/2023, 1:30 PM   *Please note that this is a verbal dictation therefore any spelling or grammatical errors are due to the "[employer]agon [employer] One" system interpretation.  Please page via Amion and do not message via secure chat for urgent patient care matters. Secure chat can be used for non urgent patient care matters.  How to contact the [facility] Attending or Consulting provider 7A - 7P or covering  provider during after hours 7P -7A, for this patient?  Check the care team in [facility] and look for a) attending/consulting [facility] provider listed and b) the [facility] team listed. Page or secure chat 7A-7P. Log into [email or web address] and use [facility]'s universal password to access. If you do not have the password, please contact the hospital operator. Locate the [facility] provider you are looking for under [facility] and page to a number that you can be directly reached. If you still have difficulty reaching the provider, please page the [facility] (Director on Call) for the Hospitalists listed on amion for assistance.

## 2023-06-03 NOTE — Plan of Care (Signed)
  Problem: Education: Goal: Knowledge of General Education information will improve Description: Including pain rating scale, medication(s)/side effects and non-pharmacologic comfort measures Outcome: Progressing   Problem: Clinical Measurements: Goal: Will remain free from infection 06/03/2023 2338 by Arabella Knife, RN Outcome: Progressing 06/03/2023 2337 by Arabella Knife, RN Outcome: Progressing   Problem: Pain Managment: Goal: General experience of comfort will improve and/or be controlled 06/03/2023 2338 by Arabella Knife, RN Outcome: Progressing 06/03/2023 2337 by Arabella Knife, RN Outcome: Progressing   Problem: Safety: Goal: Ability to remain free from injury will improve 06/03/2023 2338 by Arabella Knife, RN Outcome: Progressing 06/03/2023 2337 by Arabella Knife, RN Outcome: Progressing

## 2023-06-03 NOTE — Evaluation (Signed)
 Occupational Therapy Evaluation Patient Details Name: Jade Ellison MRN: [id number] DOB: 05/29/1945 Today's Date: 06/03/2023   History of Present Illness   Patient is 78 y.o. female who presents to the ER from home due to AMS with dysuria. In ED UA positive for pyuria, pt tachycardic and Tmax of 103.1. 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     Clinical Impressions Patient admitted for above and presents with problem list below.  At baseline, pt bedbound but transfers with assist of daughter and granddaughter into power chair for appointments (squat pivot). From bed level manages toileting with assist, bathing/dressing with min for UB and total assist for LB, setup for eating and grooming; total assist for bed mobility.  Today she presents at baseline for ADLs and bed mobility. Anticpate she will benefit from further OT services after dc at [facility] level to optimize participation in ADLs and reduce burden of care. Will defer services to [facility].       If plan is discharge home, recommend the following:   Two people to help with walking and/or transfers;A lot of help with bathing/dressing/bathroom;Assistance with cooking/housework;Assist for transportation;Help with stairs or ramp for entrance     Functional Status Assessment   Patient has had a recent decline in their functional status and demonstrates the ability to make significant improvements in function in a reasonable and predictable amount of time.     Equipment Recommendations   None recommended by OT     Recommendations for Other Services         Precautions/Restrictions   Precautions Precautions: Fall Recall of Precautions/Restrictions: Intact Restrictions Weight Bearing Restrictions Per Provider  Order: No     Mobility Bed Mobility Overal bed mobility: Needs Assistance Bed Mobility: Rolling Rolling: Total assist         General bed mobility comments: daughter assisting pt to roll with total assist, +2 total assist to reposition in bed    Transfers                   General transfer comment: deferred      Balance                                           ADL either performed or assessed with clinical judgement   ADL Overall ADL's : At baseline                                       General ADL Comments: appears at baseline for ADLs, setup for grooming and eating; min assist for UB ADls and total assist for LB ADLs     Vision   Vision Assessment?: No apparent visual deficits     Perception         Praxis         Pertinent Vitals/Pain Pain Assessment Pain Assessment: No/denies pain     Extremity/Trunk Assessment Upper Extremity Assessment Upper Extremity Assessment: Overall WFL for tasks assessed;Right hand dominant   Lower Extremity Assessment Lower Extremity Assessment: Defer to PT evaluation   Cervical / Trunk Assessment Cervical / Trunk Assessment: Other exceptions Cervical /  Trunk Exceptions: increased body habitus   Communication Communication Communication: No apparent difficulties   Cognition Arousal: Alert Behavior During Therapy: WFL for tasks assessed/performed Cognition: No apparent impairments             OT - Cognition Comments: oriented, appears intact. not formally assessed                 Following commands: Intact       Cueing  General Comments   Cueing Techniques: Verbal cues  daughter at side and supportive   Exercises     Shoulder Instructions      Home Living Family/patient expects to be discharged to:: Private residence Living Arrangements: Children (daughter and her family) Available Help at Discharge: Family;Available 24 hours/day Type of Home:  House Home Access: Ramped entrance     Home Layout: One level     Bathroom Shower/Tub: Sponge bathes at baseline         Home Equipment: Wheelchair - power (adjustable bed, bed rails on L side; pt has hoyer and sit lift?)   Additional Comments: daughter works from home, is available to assist pt with transfer to [facility] via squat pivot per pt report. no lift being used but pt thinks daughter may have ordered once recently.      Prior Functioning/Environment Prior Level of Function : Needs assist       Physical Assist : Mobility (physical);ADLs (physical) Mobility (physical): Bed mobility;Transfers ADLs (physical): Bathing;Dressing;Toileting;IADLs Mobility Comments: daughter reports stays in bed most of the time, up to wheelchair for appointments. daughter and granddaughter helps her sit on side of bed and holds her at her arms and waist and pivots pt to [facility]. pt drive chair herself. ADLs Comments: uses a purewick for toileting, daughter cleas up after toileting on bed with disposable pad. daughter assists with most ADLs, but patient completes grooming and eating    OT Problem List: Decreased activity tolerance;Decreased knowledge of use of DME or AE;Decreased knowledge of precautions;Obesity   OT Treatment/Interventions: Self-care/ADL training;Patient/family education;Therapeutic activities;DME and/or AE instruction;Therapeutic exercise      OT Goals(Current goals can be found in the care plan section)   Acute Rehab OT Goals Patient Stated Goal: home OT Goal Formulation: With patient Time For Goal Achievement: 06/17/23 Potential to Achieve Goals: Fair   OT Frequency:  Min 2X/week    Co-evaluation              AM-PAC OT "6 Clicks" Daily Activity     Outcome Measure Help from another person eating meals?: None Help from another person taking care of personal grooming?: A Little Help from another person toileting, which includes using toliet, bedpan, or urinal?:  Total Help from another person bathing (including washing, rinsing, drying)?: A Lot Help from another person to put on and taking off regular upper body clothing?: A Lot Help from another person to put on and taking off regular lower body clothing?: Total 6 Click Score: 13   End of Session Nurse Communication: Mobility status  Activity Tolerance: Patient tolerated treatment well Patient left: in bed;with call bell/phone within reach;with chair alarm set;with family/visitor present  OT Visit Diagnosis: Other abnormalities of gait and mobility (R26.89)                Time: 1610-9604 OT Time Calculation (min): 24 min Charges:  OT General Charges $OT Visit: 1 Visit OT Evaluation $OT Eval Moderate Complexity: 1 Mod  Bary Boss, OT Acute Rehabilitation Services Office [phone] Secure Chat  Preferred   Fredrich Jefferson 06/03/2023, 10:43 AM

## 2023-06-03 NOTE — TOC Initial Note (Signed)
 Transition of Care (TOC) - Initial/Assessment Note   Spoke to patient, PT and daughter Jade Ellison at bedside. Patient from home with [facility].   Patient will need ambulance transport home at discharge. NCM confined address on facesheet.   Patient has an adjustable bed and lifts at home already.   Patient and Jade Ellison would like home health services.   No Preference   [facility] not in network with patient's insurance.   Jade Ellison with Jade Ellison accepted referral.   Secure chatted MD for orders  Patient Details  Name: Jade Ellison MRN: [id number] Date of Birth: [birth year]  Transition of Care [facility] CM/SW Contact:    Jade Ferri, RN Phone Number: 06/03/2023, 10:39 AM  Clinical Narrative:                   Expected Discharge Plan: Home w Home Health Services Barriers to Discharge: Continued Medical Work up   Patient Goals and CMS Choice Patient states their goals for this hospitalization and ongoing recovery are:: to return to home CMS Medicare.gov Compare Post Acute Care list provided to:: Patient (and daughter Jade Ellison) Choice offered to / list presented to : Patient, Adult Children      Expected Discharge Plan and Services   Discharge Planning Services: CM Consult Post Acute Care Choice: Home Health Living arrangements for the past 2 months: Single Family Home                 DME Arranged: N/A         HH Arranged: RN, PT, OT HH Agency[facility] Date [facility] Agency Contacted: 06/03/23 Time HH Agency Contacted: 1039 Representative spoke with at [facility] Agency: Jade Ellison  Prior Living Arrangements/Services Living arrangements for the past 2 months: Single Family Home Lives with:: Adult Children Patient language and need for interpreter reviewed:: Yes Do you feel safe going back to the place where you live?: Yes      Need for Family Participation in Patient Care: Yes (Comment) Care giver support system in place?: Yes (comment) Current home services: DME Criminal  Activity/Legal Involvement Pertinent to Current Situation/Hospitalization: No - Comment as needed  Activities of Daily Living   ADL Screening (condition at time of admission) Independently performs ADLs?: No Does the patient have a NEW difficulty with bathing/dressing/toileting/self-feeding that is expected to last >3 days?: No Does the patient have a NEW difficulty with getting in/out of bed, walking, or climbing stairs that is expected to last >3 days?: No Does the patient have a NEW difficulty with communication that is expected to last >3 days?: No Is the patient deaf or have difficulty hearing?: No Does the patient have difficulty seeing, even when wearing glasses/contacts?: No Does the patient have difficulty concentrating, remembering, or making decisions?: No  Permission Sought/Granted   Permission granted to share information with : Yes, Verbal Permission Granted  Share Information with NAME: daughter Jade Ellison  Permission granted to share info w AGENCY: Jade Ellison        Emotional Assessment Appearance:: Appears stated age Attitude/Demeanor/Rapport: Engaged Affect (typically observed): Appropriate Orientation: : Oriented to Self, Oriented to Place, Oriented to  Time Alcohol / Substance Use: Not Applicable Psych Involvement: No (comment)  Admission diagnosis:  Sepsis secondary to UTI (HCC) [A41.9, N39.0] Sepsis (HCC) [A41.9] Patient Active Problem List   Diagnosis Date Noted   Sepsis (HCC) 06/01/2023   Opioid-induced constipation 11/18/2022   Pruritus 10/08/2022   Primary osteoarthritis of both hips 07/02/2022   Acquired thrombophilia (HCC) 07/02/2022  History of stroke 07/02/2022   Recurrent umbilical hernia 07/02/2022   Acute ischemic stroke (HCC) 10/06/2021   Type 2 diabetes mellitus with stage 3a chronic kidney disease (HCC) 10/06/2021   Paroxysmal atrial fibrillation (HCC) 10/06/2021   Polyp of transverse colon    Polyp of ascending colon    Recurrent UTI  01/16/2019   Short-term memory loss 10/06/2018   GAD (generalized anxiety disorder) 09/20/2017   Irritable bowel syndrome with both constipation and diarrhea 09/20/2017   Chronic kidney disease, stage 3a (HCC) 10/13/2016   Gastroesophageal reflux disease without esophagitis    Diabetic peripheral neuropathy associated with type 2 diabetes mellitus (HCC) 11/17/2014   Barrett's esophagus without dysplasia 06/26/2014   Dyslipidemia 05/24/2013   SVT (supraventricular tachycardia) (HCC) 09/19/2011   Aphagia 09/19/2011   Osteopenia of the elderly 07/29/2011   History of lower GI bleeding 04/10/2011   Normocytic anemia 04/10/2011   Class 3 severe obesity due to excess calories with serious comorbidity and body mass index (BMI) of 50.0 to 59.9 in adult 04/10/2011   Mixed incontinence 04/07/2007   Essential hypertension 03/25/2007   Diverticulosis of colon 03/25/2007   OSA (obstructive sleep apnea) 03/25/2007   History of kidney stones 03/25/2007   Hemorrhoids 03/25/2007   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/2007   Primary osteoarthritis of both knees 12/15/2006   CAD (coronary artery disease) 12/15/2006   PCP:  Jade Lawyer, MD Pharmacy:   [id number] Jonette Nestle, [place] - [address] AT [facility] OF [facility] & [address] [place] [address] Phone: [phone] Fax: [phone]  [facility] [address] [place] [address] Phone: [phone] Fax: [phone]     Social Drivers of Health (SDOH) Social History: SDOH Screenings   Food Insecurity: No Food Insecurity (06/01/2023)  Housing: Low Risk  (06/01/2023)  Transportation Needs: No Transportation Needs (06/01/2023)  Utilities: Not At Risk (06/01/2023)  Alcohol Screen: Low Risk  (08/22/2022)  Depression (PHQ2-9): Low Risk  (11/18/2022)  Financial Resource Strain: Low Risk  (08/22/2022)  Physical Activity: Sufficiently  Active (08/22/2022)  Social Connections: Socially Isolated (06/01/2023)  Stress: No Stress Concern Present (08/22/2022)  Tobacco Use: Low Risk  (06/02/2023)  Health Literacy: Adequate Health Literacy (08/22/2022)   SDOH Interventions:     Readmission Risk Interventions     No data to display

## 2023-06-04 DIAGNOSIS — A419 Sepsis, unspecified organism: Secondary | ICD-10-CM | POA: Diagnosis not present

## 2023-06-04 LAB — HEMOGLOBIN AND HEMATOCRIT, BLOOD
HCT: 24.9 % — ABNORMAL LOW (ref 36.0–46.0)
Hemoglobin: 7.3 g/dL — ABNORMAL LOW (ref 12.0–15.0)

## 2023-06-04 LAB — CBC WITH DIFFERENTIAL/PLATELET
Abs Immature Granulocytes: 0.02 10*3/uL (ref 0.00–0.07)
Abs Immature Granulocytes: 0.03 10*3/uL (ref 0.00–0.07)
Basophils Absolute: 0 10*3/uL (ref 0.0–0.1)
Basophils Absolute: 0 10*3/uL (ref 0.0–0.1)
Basophils Relative: 0 %
Basophils Relative: 0 %
Eosinophils Absolute: 0.1 10*3/uL (ref 0.0–0.5)
Eosinophils Absolute: 0.2 10*3/uL (ref 0.0–0.5)
Eosinophils Relative: 2 %
Eosinophils Relative: 3 %
HCT: 26.2 % — ABNORMAL LOW (ref 36.0–46.0)
HCT: 27.3 % — ABNORMAL LOW (ref 36.0–46.0)
Hemoglobin: 7.5 g/dL — ABNORMAL LOW (ref 12.0–15.0)
Hemoglobin: 8.1 g/dL — ABNORMAL LOW (ref 12.0–15.0)
Immature Granulocytes: 0 %
Immature Granulocytes: 1 %
Lymphocytes Relative: 15 %
Lymphocytes Relative: 18 %
Lymphs Abs: 0.8 10*3/uL (ref 0.7–4.0)
Lymphs Abs: 1 10*3/uL (ref 0.7–4.0)
MCH: 20.8 pg — ABNORMAL LOW (ref 26.0–34.0)
MCH: 21.6 pg — ABNORMAL LOW (ref 26.0–34.0)
MCHC: 28.6 g/dL — ABNORMAL LOW (ref 30.0–36.0)
MCHC: 29.7 g/dL — ABNORMAL LOW (ref 30.0–36.0)
MCV: 72.6 fL — ABNORMAL LOW (ref 80.0–100.0)
MCV: 72.8 fL — ABNORMAL LOW (ref 80.0–100.0)
Monocytes Absolute: 0.6 10*3/uL (ref 0.1–1.0)
Monocytes Absolute: 0.6 10*3/uL (ref 0.1–1.0)
Monocytes Relative: 11 %
Monocytes Relative: 11 %
Neutro Abs: 3.8 10*3/uL (ref 1.7–7.7)
Neutro Abs: 3.7 10*3/uL (ref 1.7–7.7)
Neutrophils Relative %: 72 %
Neutrophils Relative %: 67 %
Platelets: 151 10*3/uL (ref 150–400)
Platelets: 163 10*3/uL (ref 150–400)
RBC: 3.61 MIL/uL — ABNORMAL LOW (ref 3.87–5.11)
RBC: 3.75 MIL/uL — ABNORMAL LOW (ref 3.87–5.11)
RDW: 18.8 % — ABNORMAL HIGH (ref 11.5–15.5)
RDW: 18.6 % — ABNORMAL HIGH (ref 11.5–15.5)
WBC: 5.4 10*3/uL (ref 4.0–10.5)
WBC: 5.5 10*3/uL (ref 4.0–10.5)
nRBC: 0 % (ref 0.0–0.2)
nRBC: 0 % (ref 0.0–0.2)

## 2023-06-04 LAB — LACTIC ACID, PLASMA: Lactic Acid, Venous: 1.4 mmol/L (ref 0.5–1.9)

## 2023-06-04 LAB — CULTURE, BLOOD (ROUTINE X 2)

## 2023-06-04 LAB — BASIC METABOLIC PANEL WITH GFR
Anion gap: 7 (ref 5–15)
BUN: 14 mg/dL (ref 8–23)
CO2: 28 mmol/L (ref 22–32)
Calcium: 8.3 mg/dL — ABNORMAL LOW (ref 8.9–10.3)
Chloride: 99 mmol/L (ref 98–111)
Creatinine, Ser: 0.86 mg/dL (ref 0.44–1.00)
GFR, Estimated: >60 mL/min (ref >60)
Glucose, Bld: 131 mg/dL — ABNORMAL HIGH (ref 70–99)
Potassium: 3.6 mmol/L (ref 3.5–5.1)
Sodium: 134 mmol/L — ABNORMAL LOW (ref 135–145)

## 2023-06-04 LAB — GLUCOSE, CAPILLARY
Glucose-Capillary: 132 mg/dL — ABNORMAL HIGH (ref 70–99)
Glucose-Capillary: 174 mg/dL — ABNORMAL HIGH (ref 70–99)
Glucose-Capillary: 171 mg/dL — ABNORMAL HIGH (ref 70–99)
Glucose-Capillary: 150 mg/dL — ABNORMAL HIGH (ref 70–99)

## 2023-06-04 LAB — VITAMIN B1: Vitamin B1 (Thiamine): 173.1 nmol/L (ref 66.5–200.0)

## 2023-06-04 MED ORDER — CEFADROXIL 500 MG PO CAPS
1000.0000 mg | ORAL_CAPSULE | Freq: Two times a day (BID) | ORAL | Status: DC
  Administered 2023-06-04 – 2023-06-05 (×2): 1000 mg via ORAL
  Filled 2023-06-04 (×3): qty 2

## 2023-06-04 NOTE — Plan of Care (Signed)
  Problem: Fluid Volume: Goal: Ability to maintain a balanced intake and output will improve Outcome: Progressing   Problem: Health Behavior/Discharge Planning: Goal: Ability to manage health-related needs will improve Outcome: Progressing   Problem: Nutritional: Goal: Progress toward achieving an optimal weight will improve Outcome: Progressing   Problem: Skin Integrity: Goal: Risk for impaired skin integrity will decrease Outcome: Progressing

## 2023-06-04 NOTE — Progress Notes (Signed)
 PROGRESS NOTE    Jade Ellison  WGN:[id number] DOB: [birth year] DOA: 06/01/2023 PCP: Graig Lawyer, MD   Brief Narrative:  Jade Ellison is a 78 y.o. female with PMH of HLD, HTN, OSA on CPAP, CKD 3A, type II DM, obesity, chronic AF on Eliquis  presented to the hospital with confusion. Patient was having gradual decline for last few days with some dizziness and lightheadedness as well as excessive sleepiness for the last 2 to 3 weeks with change in her sleep cycle pattern from nights to days.  Eventually found to have Klebsiella pneumonia bacteremia and UTI.  Assessment & Plan:   Principal Problem:   Sepsis (HCC)  Severe sepsis secondary to Klebsiella bacteremia due to UTI/right pyelonephritis, POA:. Met severe sepsis criteria on admission based on temperature 103, hide tachycardia and tachypnea.  UA positive for pyuria.  Lactic acid was 5.  Started on cefepime  but switched to Rocephin  due to Klebsiella pneumonia positive blood culture.  Due to abdominal pain, CT abdomen pelvis was obtained which indicates right pyelonephritis, findings may represent blood products or calcification but neoplasm is not excluded so further evaluation with CT urogram is recommended which was completed yesterday and shows possibility of blood clot/hematoma and pyelonephritis.  Patient's lactic acid was 5.5 upon admission which has finally normalized after fluid boluses.   Acute on chronic anemia: Upon chart review, it appears that patient has chronic anemia with hemoglobin ranging anywhere between 9-11.  This hospitalization, patient came in with hemoglobin of 9.7 which has now dropped to 7.3.  Wondering if this is secondary to hematoma/blood clot in the right pelvic area.  Since she is at risk of requiring blood transfusion dropping to 7, we are going to observe her overnight, repeat H&H at 5 PM and tomorrow morning.  I have discussed potential blood transfusion with the family and they are in agreement.    Acute metabolic encephalopathy, POA: This was likely secondary to severe sepsis.  Patient is fully alert and oriented, encephalopathy has resolved.   Persistent A-fib, POA: Had some RVR in the beginning, her Lopressor  and Eliquis  was resumed, rates are controlled, monitor on telemetry.   Type II DM, uncontrolled with hyperglycemia with long-term insulin  use with HLD. Last hemoglobin A1c in January 2025 was 8.1.  Patient appears to be only on glipizide  and metformin but no insulin .  She was initially started on long-acting insulin  but due to hypoglycemia, that was discontinued, currently only on SSI, blood sugar fairly controlled except this morning when she was 219.  Continue SSI for now.   Bilateral knee pain and right hip pain: Patient and daughter both concerned about fracture stating that patient has severe osteoporosis and she is at high risk of fractures.  Although there is no history of trauma and on examination, all the joints appeared normal and has good range of motion but per patient and family insistence, we obtained pelvic x-ray, bilateral knee x-ray and fracture was ruled out.  Patient's pain is improved today. PS: Patient is chronically bedbound at baseline.   Chronic HFpEF. Has some swelling in her leg.  Possible third spacing with intravascular volume depletion causing presentation with mild AKI.  Lasix  is on hold for now.   OSA. Continue CPAP nightly.   Morbid obesity Class 3 Body mass index is 49.75 kg/m.  Placing the pt at higher risk of poor outcomes.  Weight loss and diet modification counseled.   Bilateral hip pain knee pain. Likely arthritis. Monitor.   Chronic  constipation with abdominal pain. Will continue Bentyl .  Dulcolax suppository.   HLD. Continue statin.   Chronic diabetic neuropathy Continue lyrica   DVT prophylaxis: Eliquis    Code Status: Full Code  Family Communication: Daughter present at bedside.  Plan of care discussed with patient in length  and he/she verbalized understanding and agreed with it.  Status is: Inpatient Remains inpatient appropriate because: Dropping hemoglobin, may need blood transfusion.   Estimated body mass index is 50.77 kg/m as calculated from the following:   Height as of this encounter: 5\' 3"  (1.6 m).   Weight as of this encounter: 130 kg.  Pressure Injury 06/01/23 Buttocks Stage 2 -  Partial thickness loss of dermis presenting as a shallow open injury with a red, pink wound bed without slough. (Active)  06/01/23 2311  Location: Buttocks  Location Orientation:   Staging: Stage 2 -  Partial thickness loss of dermis presenting as a shallow open injury with a red, pink wound bed without slough.  Wound Description (Comments):   Present on Admission: Yes  Dressing Type Foam - Lift dressing to assess site every shift 06/03/23 2100   Nutritional Assessment: Body mass index is 50.77 kg/m.Aaron Aas Seen by dietician.  I agree with the assessment and plan as outlined below: Nutrition Status:        . Skin Assessment: I have examined the patient's skin and I agree with the wound assessment as performed by the wound care RN as outlined below: Pressure Injury 06/01/23 Buttocks Stage 2 -  Partial thickness loss of dermis presenting as a shallow open injury with a red, pink wound bed without slough. (Active)  06/01/23 2311  Location: Buttocks  Location Orientation:   Staging: Stage 2 -  Partial thickness loss of dermis presenting as a shallow open injury with a red, pink wound bed without slough.  Wound Description (Comments):   Present on Admission: Yes  Dressing Type Foam - Lift dressing to assess site every shift 06/03/23 2100    Consultants:  None  Procedures:  None  Antimicrobials:  Anti-infectives (From admission, onward)    Start     Dose/Rate Route Frequency Ordered Stop   06/02/23 2200  vancomycin  (VANCOREADY) IVPB 1500 mg/300 mL  Status:  Discontinued        1,500 mg 150 mL/hr over 120  Minutes Intravenous Every 24 hours 06/02/23 0144 06/02/23 0755   06/02/23 2000  cefTRIAXone  (ROCEPHIN ) 2 g in sodium chloride  0.9 % 100 mL IVPB        2 g 200 mL/hr over 30 Minutes Intravenous Every 24 hours 06/02/23 1439     06/02/23 0030  vancomycin  (VANCOREADY) IVPB 2000 mg/400 mL        2,000 mg 200 mL/hr over 120 Minutes Intravenous  Once 06/01/23 2338 06/02/23 0330   06/01/23 2345  ceFEPIme  (MAXIPIME ) 2 g in sodium chloride  0.9 % 100 mL IVPB  Status:  Discontinued        2 g 200 mL/hr over 30 Minutes Intravenous Every 12 hours 06/01/23 2338 06/02/23 1439   06/01/23 1930  cefTRIAXone  (ROCEPHIN ) 1 g in sodium chloride  0.9 % 100 mL IVPB        1 g 200 mL/hr over 30 Minutes Intravenous  Once 06/01/23 1928 06/01/23 2017   06/01/23 1715  ceFEPIme  (MAXIPIME ) 2 g in sodium chloride  0.9 % 100 mL IVPB  Status:  Discontinued        2 g 200 mL/hr over 30 Minutes Intravenous  Once 06/01/23 1710 06/01/23  1928   06/01/23 1715  metroNIDAZOLE  (FLAGYL ) IVPB 500 mg  Status:  Discontinued        500 mg 100 mL/hr over 60 Minutes Intravenous  Once 06/01/23 1710 06/01/23 1928   06/01/23 1715  vancomycin  (VANCOCIN ) IVPB 1000 mg/200 mL premix  Status:  Discontinued        1,000 mg 200 mL/hr over 60 Minutes Intravenous  Once 06/01/23 1710 06/01/23 1711   06/01/23 1715  vancomycin  (VANCOCIN ) 2,250 mg in sodium chloride  0.9 % 500 mL IVPB  Status:  Discontinued        2,250 mg 261.3 mL/hr over 120 Minutes Intravenous  Once 06/01/23 1711 06/01/23 1928         Subjective: Patient seen and examined, daughter and another family member at the bedside, patient states that she is feeling much better, no complaints.  Objective: Vitals:   06/03/23 2203 06/04/23 0424 06/04/23 0500 06/04/23 0750  BP:  (!) 144/71  (!) 146/92  Pulse: 96 89  100  Resp: 20 16  17   Temp:  97.6 F (36.4 C)  98.2 F (36.8 C)  TempSrc:  Oral  Oral  SpO2: 98% 98%  98%  Weight:   130 kg   Height:        Intake/Output Summary  (Last 24 hours) at 06/04/2023 1239 Last data filed at 06/04/2023 1200 Gross per 24 hour  Intake 477 ml  Output 1700 ml  Net -1223 ml   Filed Weights   06/02/23 0500 06/03/23 0620 06/04/23 0500  Weight: 127.4 kg 130 kg 130 kg    Examination:  General exam: Appears calm and comfortable, morbidly obese Respiratory system: Clear to auscultation. Respiratory effort normal. Cardiovascular system: S1 & S2 heard, RRR. No JVD, murmurs, rubs, gallops or clicks. No pedal edema. Gastrointestinal system: Abdomen is nondistended, soft and nontender. No organomegaly or masses felt. Normal bowel sounds heard. Central nervous system: Alert and oriented. No focal neurological deficits. Extremities: Symmetric 5 x 5 power. Skin: No rashes, lesions or ulcers.  Psychiatry: Judgement and insight appear normal. Mood & affect appropriate.   Data Reviewed: I have personally reviewed following labs and imaging studies  CBC: Recent Labs  Lab 06/01/23 1710 06/02/23 0839 06/02/23 1227 06/03/23 0908 06/04/23 0623 06/04/23 1030  WBC 10.0 10.2  --  6.2 5.4  --   NEUTROABS 8.8*  --   --  4.9 3.8  --   HGB 9.7* 7.9* 8.1* 8.0* 7.5* 7.3*  HCT 31.4* 27.1* 27.8* 27.6* 26.2* 24.9*  MCV 70.2* 72.3*  --  73.4* 72.6*  --   PLT 205 171  --  153 151  --    Basic Metabolic Panel: Recent Labs  Lab 06/01/23 1710 06/02/23 0840 06/03/23 0908 06/04/23 0623  NA 132* 133* 134* 134*  K 4.2 3.8 3.6 3.6  CL 93* 96* 98 99  CO2 25 26 25 28   GLUCOSE 202* 69* 148* 131*  BUN 22 23 21 14   CREATININE 1.12* 1.02* 0.90 0.86  CALCIUM  9.2 8.7* 8.5* 8.3*  MG  --  1.0* 1.9  --    GFR: Estimated Creatinine Clearance: 71 mL/min (by C-G formula based on SCr of 0.86 mg/dL). Liver Function Tests: Recent Labs  Lab 06/01/23 1710 06/02/23 0840  AST 49* 29  ALT 19 17  ALKPHOS 91 69  BILITOT 1.4* 1.1  PROT 7.2 6.3*  ALBUMIN 3.1* 2.6*   Recent Labs  Lab 06/01/23 1710  LIPASE 28   Recent Labs  Lab 06/01/23  1710   AMMONIA 28   Coagulation Profile: No results for input(s): "INR", "PROTIME" in the last 168 hours. Cardiac Enzymes: No results for input(s): "CKTOTAL", "CKMB", "CKMBINDEX", "TROPONINI" in the last 168 hours. BNP (last 3 results) Recent Labs    02/13/23 1456  PROBNP 507.0*   HbA1C: Recent Labs    06/03/23 1439  HGBA1C 6.5*   CBG: Recent Labs  Lab 06/03/23 0843 06/03/23 1138 06/03/23 2104 06/04/23 0752 06/04/23 1205  GLUCAP 140* 219* 190* 132* 174*   Lipid Profile: No results for input(s): "CHOL", "HDL", "LDLCALC", "TRIG", "CHOLHDL", "LDLDIRECT" in the last 72 hours. Thyroid  Function Tests: Recent Labs    06/01/23 1710  TSH 1.980   Anemia Panel: Recent Labs    06/02/23 0840 06/02/23 1215  VITAMINB12 458  --   FOLATE 36.0  --   TIBC 312  --   IRON  15*  --   RETICCTPCT  --  2.0   Sepsis Labs: Recent Labs  Lab 06/02/23 1215 06/03/23 1122 06/03/23 1958 06/04/23 0813  LATICACIDVEN 2.2* 2.3* 2.5* 1.4    Recent Results (from the past 240 hours)  Culture, blood (routine x 2)     Status: Abnormal   Collection Time: 06/01/23  5:10 PM   Specimen: BLOOD  Result Value Ref Range Status   Specimen Description BLOOD LEFT ANTECUBITAL  Final   Special Requests   Final    BOTTLES DRAWN AEROBIC AND ANAEROBIC Blood Culture results may not be optimal due to an inadequate volume of blood received in culture bottles   Culture  Setup Time   Final    GRAM NEGATIVE RODS AEROBIC BOTTLE ONLY CRITICAL RESULT CALLED TO, READ BACK BY AND VERIFIED WITH: PHARMD E SINCLAIRE [id number] AT 1420 BY CM Performed at [facility], [address], [place], [place] [address]    Culture KLEBSIELLA PNEUMONIAE (A)  Final   Report Status 06/04/2023 FINAL  Final   Organism ID, Bacteria KLEBSIELLA PNEUMONIAE  Final   Organism ID, Bacteria KLEBSIELLA PNEUMONIAE  Final      Susceptibility   Klebsiella pneumoniae - KIRBY BAUER*    CEFAZOLIN SENSITIVE Sensitive    Klebsiella pneumoniae -  MIC*    AMPICILLIN >=32 RESISTANT Resistant     CEFEPIME  <=0.12 SENSITIVE Sensitive     CEFTAZIDIME <=1 SENSITIVE Sensitive     CEFTRIAXONE  <=0.25 SENSITIVE Sensitive     CIPROFLOXACIN <=0.25 SENSITIVE Sensitive     GENTAMICIN <=1 SENSITIVE Sensitive     IMIPENEM <=0.25 SENSITIVE Sensitive     TRIMETH/SULFA <=20 SENSITIVE Sensitive     AMPICILLIN/SULBACTAM 4 SENSITIVE Sensitive     PIP/TAZO <=4 SENSITIVE Sensitive ug/mL    * KLEBSIELLA PNEUMONIAE    KLEBSIELLA PNEUMONIAE  Blood Culture ID Panel (Reflexed)     Status: Abnormal   Collection Time: 06/01/23  5:10 PM  Result Value Ref Range Status   Enterococcus faecalis NOT DETECTED NOT DETECTED Final   Enterococcus Faecium NOT DETECTED NOT DETECTED Final   Listeria monocytogenes NOT DETECTED NOT DETECTED Final   Staphylococcus species NOT DETECTED NOT DETECTED Final   Staphylococcus aureus (BCID) NOT DETECTED NOT DETECTED Final   Staphylococcus epidermidis NOT DETECTED NOT DETECTED Final   Staphylococcus lugdunensis NOT DETECTED NOT DETECTED Final   Streptococcus species NOT DETECTED NOT DETECTED Final   Streptococcus agalactiae NOT DETECTED NOT DETECTED Final   Streptococcus pneumoniae NOT DETECTED NOT DETECTED Final   Streptococcus pyogenes NOT DETECTED NOT DETECTED Final   A.calcoaceticus-baumannii NOT DETECTED NOT DETECTED  Final   Bacteroides fragilis NOT DETECTED NOT DETECTED Final   Enterobacterales DETECTED (A) NOT DETECTED Final    Comment: Enterobacterales represent a large order of gram negative bacteria, not a single organism. CRITICAL RESULT CALLED TO, READ BACK BY AND VERIFIED WITH: PHARMD E SINCLAIRE [id number] AT 1420 BY CM    Enterobacter cloacae complex NOT DETECTED NOT DETECTED Final   Escherichia coli NOT DETECTED NOT DETECTED Final   Klebsiella aerogenes NOT DETECTED NOT DETECTED Final   Klebsiella oxytoca NOT DETECTED NOT DETECTED Final   Klebsiella pneumoniae DETECTED (A) NOT DETECTED Final    Comment: CRITICAL  RESULT CALLED TO, READ BACK BY AND VERIFIED WITH: PHARMD E SINCLAIRE [id number] AT 1420 BY CM    Proteus species NOT DETECTED NOT DETECTED Final   Salmonella species NOT DETECTED NOT DETECTED Final   Serratia marcescens NOT DETECTED NOT DETECTED Final   Haemophilus influenzae NOT DETECTED NOT DETECTED Final   Neisseria meningitidis NOT DETECTED NOT DETECTED Final   Pseudomonas aeruginosa NOT DETECTED NOT DETECTED Final   Stenotrophomonas maltophilia NOT DETECTED NOT DETECTED Final   Candida albicans NOT DETECTED NOT DETECTED Final   Candida auris NOT DETECTED NOT DETECTED Final   Candida glabrata NOT DETECTED NOT DETECTED Final   Candida krusei NOT DETECTED NOT DETECTED Final   Candida parapsilosis NOT DETECTED NOT DETECTED Final   Candida tropicalis NOT DETECTED NOT DETECTED Final   Cryptococcus neoformans/gattii NOT DETECTED NOT DETECTED Final   CTX-M ESBL NOT DETECTED NOT DETECTED Final   Carbapenem resistance IMP NOT DETECTED NOT DETECTED Final   Carbapenem resistance KPC NOT DETECTED NOT DETECTED Final   Carbapenem resistance NDM NOT DETECTED NOT DETECTED Final   Carbapenem resist OXA 48 LIKE NOT DETECTED NOT DETECTED Final   Carbapenem resistance VIM NOT DETECTED NOT DETECTED Final    Comment: Performed at [facility], [address], [place], [place] [address]  Culture, blood (routine x 2)     Status: None (Preliminary result)   Collection Time: 06/01/23  5:21 PM   Specimen: BLOOD  Result Value Ref Range Status   Specimen Description BLOOD SITE NOT SPECIFIED  Final   Special Requests   Final    BOTTLES DRAWN AEROBIC AND ANAEROBIC Blood Culture adequate volume   Culture   Final    NO GROWTH 3 DAYS Performed at [facility], [address], [place], [place] [address]    Report Status PENDING  Incomplete  Urine Culture     Status: Abnormal   Collection Time: 06/01/23  6:34 PM   Specimen: Urine, Random  Result Value Ref Range Status   Specimen Description URINE,  RANDOM  Final   Special Requests   Final    NONE Reflexed from [id number] Performed at [facility], [address] [place], [place] [address]    Culture MULTIPLE SPECIES PRESENT, SUGGEST RECOLLECTION (A)  Final   Report Status 06/03/2023 FINAL  Final  MRSA Next Gen by PCR, Nasal     Status: None   Collection Time: 06/02/23 12:22 AM   Specimen: Nasal Mucosa; Nasal Swab  Result Value Ref Range Status   MRSA by PCR Next Gen NOT DETECTED NOT DETECTED Final    Comment: (NOTE) The GeneXpert MRSA Assay (FDA approved for NASAL specimens only), is one component of a comprehensive MRSA colonization surveillance program. It is not intended to diagnose MRSA infection nor to guide or monitor treatment for MRSA infections. Test performance is not FDA approved  in patients less than 60 years old. Performed at [facility], [address], [place], [place] [address]      Radiology Studies: CT RENAL ABD W/WO Result Date: 06/03/2023 CLINICAL DATA:  Renal mass suspected * Tracking Code: BO * EXAM: CT ABDOMEN WITHOUT AND WITH CONTRAST TECHNIQUE: Multidetector CT imaging of the abdomen was performed following the standard protocol before and following the bolus administration of intravenous contrast. RADIATION DOSE REDUCTION: This exam was performed according to the departmental dose-optimization program which includes automated exposure control, adjustment of the mA and/or kV according to patient size and/or use of iterative reconstruction technique. CONTRAST:  OMNIPAQUE  IOHEXOL  350 MG/ML SOLN COMPARISON:  CT abdomen pelvis, 06/02/2023 FINDINGS: Lower chest: Trace bilateral pleural effusions. Three-vessel coronary artery calcifications. Hepatobiliary: No focal liver abnormality is seen. Status post cholecystectomy. No biliary dilatation. Pancreas: Unremarkable. No pancreatic ductal dilatation or surrounding inflammatory changes. Spleen: Normal in size without significant abnormality.  Adrenals/Urinary Tract: Adrenal glands are unremarkable. Small, calcified involuted right renal cortical cysts, for which no specific further follow-up or characterization is required. No calculi. Prominent right renal pelvis with some prominent, amorphous dependent hyperdense material, HU = 91, without associated contrast enhancement (series 5, image 71). Adjacent fat stranding. No overt hydronephrosis. Lobulated renal cortices bilaterally. No calculi. Stomach/Bowel: Stomach is within normal limits. No evidence of bowel wall thickening, distention, or inflammatory changes. Vascular/Lymphatic: Aortic atherosclerosis. No enlarged abdominal lymph nodes. Other: No abdominal wall hernia or abnormality. No ascites. Musculoskeletal: No acute or significant osseous findings. IMPRESSION: 1. Prominent right renal pelvis with some prominent, amorphous dependent hyperdense material, without associated contrast enhancement. Adjacent fat stranding. Findings most likely reflect clot and some associated inflammation. No overt hydronephrosis. Recommend short interval follow-up examination to assess for clearing. 2. No suspicious renal mass or contrast enhancement. Small, calcified involuted right renal cortical cysts, for which no specific further follow-up or characterization is required. 3. Coronary artery disease. Aortic Atherosclerosis (ICD10-I70.0). Electronically Signed   By: Fredricka Jenny M.D.   On: 06/03/2023 20:09   DG HIP UNILAT WITH PELVIS 2-3 VIEWS LEFT Result Date: 06/03/2023 CLINICAL DATA:  Bilateral hip pain. EXAM: DG HIP (WITH OR WITHOUT PELVIS) 2-3V RIGHT; DG HIP (WITH OR WITHOUT PELVIS) 2-3V LEFT COMPARISON:  Reformats from abdominal CT yesterday FINDINGS: The bones are subjectively under mineralized. Advanced right and moderate left hip osteoarthritis with joint space narrowing, osteophytes and subchondral cystic change. No fracture or erosions. No evidence of a vascular necrosis. Pubic symphysis and  sacroiliac joints are congruent with mild degenerative sacroiliac spurring. Peripheral vascular calcifications are seen. IMPRESSION: 1. 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 2. Peripheral vascular disease. Electronically Signed   By: Chadwick Colonel M.D.   On: 06/03/2023 15:22   DG HIP UNILAT WITH PELVIS 2-3 VIEWS RIGHT Result Date: 06/03/2023 CLINICAL DATA:  Bilateral hip pain. EXAM: DG HIP (WITH OR WITHOUT PELVIS) 2-3V RIGHT; DG HIP (WITH OR WITHOUT PELVIS) 2-3V LEFT COMPARISON:  Reformats from abdominal CT yesterday FINDINGS: The bones are subjectively under mineralized. Advanced right and moderate left hip osteoarthritis with joint space narrowing, osteophytes and subchondral cystic change. No fracture or erosions. No evidence of a vascular necrosis. Pubic symphysis and sacroiliac joints are congruent with mild degenerative sacroiliac spurring. Peripheral vascular calcifications are seen. IMPRESSION: 1. 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 2. Peripheral vascular disease. Electronically Signed   By: Chadwick Colonel M.D.   On: 06/03/2023 15:22   DG Knee 1-2 Views Left Result Date:  06/03/2023 CLINICAL DATA:  Pain. EXAM: LEFT KNEE - 1-2 VIEW COMPARISON:  None Available. FINDINGS: Technically limited by positioning. The bones are subjectively under mineralized. Advanced osteoarthritis with joint space narrowing, osteophytes and subchondral cystic change. No significant knee joint effusion. No evidence of fracture or focal bone abnormality. Peripheral vascular calcifications. Generalized dermal and subcutaneous calcifications IMPRESSION: 1. Advanced osteoarthritis of the left knee. 2. Peripheral vascular disease. Electronically Signed   By: Chadwick Colonel M.D.   On: 06/03/2023 15:20   DG Knee 1-2 Views Right Result Date: 06/03/2023 CLINICAL DATA:  Pain. EXAM: RIGHT KNEE - 1-2 VIEW COMPARISON:  None Available. FINDINGS: Technically limited due to positioning. The bones are  subjectively under mineralized. Advanced osteoarthritis with spurring, subchondral cysts and joint space narrowing. Potential ossified bodies in the suprapatellar space. Small joint effusion. No evidence of fracture. No obvious erosive change. Peripheral vascular calcifications are seen. Generalized dermal and soft tissue calcifications. IMPRESSION: 1. Advanced osteoarthritis of the right knee. Small joint effusion. 2. Peripheral vascular disease. Electronically Signed   By: Chadwick Colonel M.D.   On: 06/03/2023 15:20   CT ABDOMEN PELVIS WO CONTRAST Result Date: 06/02/2023 CLINICAL DATA:  UTI EXAM: CT ABDOMEN AND PELVIS WITHOUT CONTRAST TECHNIQUE: Multidetector CT imaging of the abdomen and pelvis was performed following the standard protocol without IV contrast. RADIATION DOSE REDUCTION: This exam was performed according to the departmental dose-optimization program which includes automated exposure control, adjustment of the mA and/or kV according to patient size and/or use of iterative reconstruction technique. COMPARISON:  CT stone 03/20/2008 FINDINGS: Lower chest: No acute abnormality. Hepatobiliary: No focal liver abnormality is seen. Status post cholecystectomy. No biliary dilatation. Pancreas: Unremarkable. No pancreatic ductal dilatation or surrounding inflammatory changes. Spleen: Normal in size without focal abnormality. Adrenals/Urinary Tract: There is a vague hyperdensity within the right renal pelvis. The right renal pelvis is dilated with surrounding inflammatory stranding. Hyperdense area measures approximately 1.7 by by 1.9 x 1.0 cm. There is no hydronephrosis in either kidney. Subcentimeter hyperdense area in the inferior pole left kidney is indeterminate. The adrenal glands and bladder are within normal limits. Stomach/Bowel: Stomach is within normal limits. Appendix appears normal. No evidence of bowel wall thickening, distention, or inflammatory changes. There is sigmoid colon  diverticulosis. There is a large amount of stool in the rectum. Vascular/Lymphatic: Aortic atherosclerosis. No enlarged abdominal or pelvic lymph nodes. Reproductive: The uterus and left adnexa are within normal limits. Calcific density is seen in the right adnexa/right ovary measuring 4.7 x 3.3 cm similar to 2010. Other: There is no ascites. There is a small fat containing umbilical hernia. There is mild body wall edema. Musculoskeletal: Degenerative changes affect the spine and hips. IMPRESSION: 1. Vague hyperdensity within the right renal pelvis with surrounding inflammatory stranding. Findings may represent blood products or calcification, but neoplasm is not excluded. Recommend further evaluation with CT urogram. 2. Subcentimeter hyperdense area in the inferior pole left kidney is indeterminate. This can be further evaluated with CT urogram. 3. Sigmoid colon diverticulosis. 4. Large amount of stool in the rectum. 5. Mild body wall edema. 6. Aortic atherosclerosis. Aortic Atherosclerosis (ICD10-I70.0). Electronically Signed   By: Tyron Gallon M.D.   On: 06/02/2023 20:01    Scheduled Meds:  apixaban   5 mg Oral BID   atorvastatin   20 mg Oral Daily   bisacodyl  10 mg Rectal Once   bisacodyl  10 mg Rectal Once   dicyclomine   20 mg Oral BID   DULoxetine   60 mg  Oral Daily   feeding supplement (GLUCERNA SHAKE)  237 mL Oral TID BM   ferrous sulfate   325 mg Oral Q breakfast   insulin  aspart  0-5 Units Subcutaneous QHS   insulin  aspart  0-9 Units Subcutaneous TID WC   latanoprost   1 drop Both Eyes QHS   metoprolol  tartrate  12.5 mg Oral BID   pantoprazole   40 mg Oral Daily   polyethylene glycol  17 g Oral Daily   pregabalin   50 mg Oral BID   senna-docusate  2 tablet Oral BID   Continuous Infusions:  cefTRIAXone  (ROCEPHIN )  IV 2 g (06/03/23 1952)     LOS: 3 days   Modena Andes, MD [facility]  06/04/2023, 12:39 PM   *Please note that this is a verbal dictation therefore any spelling  or grammatical errors are due to the "[employer]agon [employer] One" system interpretation.  Please page via Amion and do not message via secure chat for urgent patient care matters. Secure chat can be used for non urgent patient care matters.  How to contact the [facility] Attending or Consulting provider 7A - 7P or covering provider during after hours 7P -7A, for this patient?  Check the care team in [facility] and look for a) attending/consulting [facility] provider listed and b) the [facility] team listed. Page or secure chat 7A-7P. Log into [email or web address] and use [facility]'s universal password to access. If you do not have the password, please contact the hospital operator. Locate the [facility] provider you are looking for under [facility] and page to a number that you can be directly reached. If you still have difficulty reaching the provider, please page the [facility] (Director on Call) for the Hospitalists listed on amion for assistance.

## 2023-06-04 NOTE — Progress Notes (Signed)
   06/04/23 2218  BiPAP/CPAP/SIPAP  $ Non-Invasive Home Ventilator  Subsequent  BiPAP/CPAP/SIPAP Pt Type Adult  BiPAP/CPAP/SIPAP Resmed  Mask Type Nasal mask  Mask Size Medium  Respiratory Rate 18 breaths/min  Flow Rate 2 lpm  CPAP 6 cmH2O  Patient Home Machine No  Patient Home Mask No  Patient Home Tubing No  Auto Titrate No  Device Plugged into RED Power Outlet Yes

## 2023-06-05 ENCOUNTER — Other Ambulatory Visit (HOSPITAL_COMMUNITY): Payer: Self-pay

## 2023-06-05 DIAGNOSIS — A419 Sepsis, unspecified organism: Secondary | ICD-10-CM | POA: Diagnosis not present

## 2023-06-05 DIAGNOSIS — R6520 Severe sepsis without septic shock: Secondary | ICD-10-CM | POA: Diagnosis not present

## 2023-06-05 DIAGNOSIS — L8990 Pressure ulcer of unspecified site, unspecified stage: Secondary | ICD-10-CM | POA: Insufficient documentation

## 2023-06-05 DIAGNOSIS — N10 Acute tubulo-interstitial nephritis: Secondary | ICD-10-CM | POA: Insufficient documentation

## 2023-06-05 LAB — CBC WITH DIFFERENTIAL/PLATELET
Abs Immature Granulocytes: 0.02 10*3/uL (ref 0.00–0.07)
Basophils Absolute: 0 10*3/uL (ref 0.0–0.1)
Basophils Relative: 0 %
Eosinophils Absolute: 0.2 10*3/uL (ref 0.0–0.5)
Eosinophils Relative: 3 %
HCT: 26.5 % — ABNORMAL LOW (ref 36.0–46.0)
Hemoglobin: 7.8 g/dL — ABNORMAL LOW (ref 12.0–15.0)
Immature Granulocytes: 0 %
Lymphocytes Relative: 17 %
Lymphs Abs: 0.9 10*3/uL (ref 0.7–4.0)
MCH: 21.3 pg — ABNORMAL LOW (ref 26.0–34.0)
MCHC: 29.4 g/dL — ABNORMAL LOW (ref 30.0–36.0)
MCV: 72.2 fL — ABNORMAL LOW (ref 80.0–100.0)
Monocytes Absolute: 0.6 10*3/uL (ref 0.1–1.0)
Monocytes Relative: 11 %
Neutro Abs: 3.4 10*3/uL (ref 1.7–7.7)
Neutrophils Relative %: 69 %
Platelets: 155 10*3/uL (ref 150–400)
RBC: 3.67 MIL/uL — ABNORMAL LOW (ref 3.87–5.11)
RDW: 18.6 % — ABNORMAL HIGH (ref 11.5–15.5)
WBC: 5 10*3/uL (ref 4.0–10.5)
nRBC: 0 % (ref 0.0–0.2)

## 2023-06-05 LAB — GLUCOSE, CAPILLARY
Glucose-Capillary: 146 mg/dL — ABNORMAL HIGH (ref 70–99)
Glucose-Capillary: 158 mg/dL — ABNORMAL HIGH (ref 70–99)

## 2023-06-05 MED ORDER — BISACODYL 10 MG RE SUPP: 10.0000 mg | Freq: Once | RECTAL | Status: DC

## 2023-06-05 MED ORDER — CEFADROXIL 500 MG PO CAPS
1000.0000 mg | ORAL_CAPSULE | Freq: Two times a day (BID) | ORAL | 0 refills | Status: AC
  Filled 2023-06-05: qty 20, 5d supply, fill #0

## 2023-06-05 NOTE — Care Management Important Message (Signed)
 Important Message  Patient Details  Name: Jade Ellison MRN: [id number] Date of Birth: 1945-11-21   Important Message Given:  Yes - Medicare IM     Felix Host 06/05/2023, 1:05 PM

## 2023-06-05 NOTE — Discharge Summary (Signed)
 Physician Discharge Summary  Jade Ellison WGN:[id number] DOB: [birth year] DOA: 06/01/2023  PCP: Graig Lawyer, MD  Admit date: 06/01/2023 Discharge date: 06/05/2023 30 Day Unplanned Readmission Risk Score    Flowsheet Row ED to Hosp-Admission (Current) from 06/01/2023 in [facility]  SURGICAL  30 Day Unplanned Readmission Risk Score (%) 14.57 Filed at 06/05/2023 0801       This score is the patient's risk of an unplanned readmission within 30 days of being discharged (0 -100%). The score is based on dignosis, age, lab data, medications, orders, and past utilization.   Low:  0-14.9   Medium: 15-21.9   High: 22-29.9   Extreme: 30 and above          Admitted From: Home Disposition: Home  Recommendations for Outpatient Follow-up:  Follow up with PCP in 1-2 weeks Please obtain BMP/CBC in one week Please follow up with your PCP on the following pending results: Unresulted Labs (From admission, onward)    None         Home Health: Yes Equipment/Devices: None  Discharge Condition: Stable CODE STATUS: Full code Diet recommendation: Cardiac  Subjective: Seen and examined, daughter at the bedside.  Patient has no complaints and she is very excited to go home.  Brief/Interim Summary: Jade Ellison is a 78 y.o. female with PMH of HLD, HTN, OSA on CPAP, CKD 3A, type II DM, obesity, chronic AF on Eliquis  presented to the hospital with confusion. Patient was having gradual decline for last few days with some dizziness and lightheadedness as well as excessive sleepiness for the last 2 to 3 weeks with change in her sleep cycle pattern from nights to days.  Eventually found to have Klebsiella pneumonia bacteremia and UTI.  Details below.  Severe sepsis secondary to Klebsiella bacteremia due to UTI/right pyelonephritis, POA:. Met severe sepsis criteria on admission based on temperature 103, hide tachycardia and tachypnea.  UA positive for pyuria.  Lactic acid  was 5.  Started on cefepime  but switched to Rocephin  due to Klebsiella pneumonia positive blood culture.  Due to abdominal pain, CT abdomen pelvis was obtained which indicates right pyelonephritis, findings may represent blood products or calcification but neoplasm is not excluded so further evaluation with CT urogram is recommended which was completed and shows possibility of blood clot/hematoma and pyelonephritis.  Patient's lactic acid was 5.5 upon admission which has finally normalized after fluid boluses.  Based on the urine culture sensitivities, patient was transitioned from IV antibiotics to cefadroxil yesterday after consulting with pharmacy.  Patient has received at least 45 days of IV antibiotics, discharging on 5 more days of cefadroxil to complete the course.   Acute on chronic anemia: Upon chart review, it appears that patient has chronic anemia with hemoglobin ranging anywhere between 9-11.  This hospitalization, patient came in with hemoglobin of 9.7 which dropped to 7.3 as the lowest yesterday afternoon.  Due to risk of further dropping and requiring blood transfusion, we decided to keep her overnight.  Repeated hemoglobin was 8 yesterday and 7.8 today.  This indicates stabilization of hemoglobin and no active bleeding despite of continuation of Eliquis .  Patient is stable.  Discussed with patient and the daughter at the bedside.   Acute metabolic encephalopathy, POA: This was likely secondary to severe sepsis.  Patient is fully alert and oriented, encephalopathy has resolved.   Persistent A-fib, POA: Had some RVR in the beginning, her Lopressor  and Eliquis  was resumed, rates are controlled, monitor on  telemetry.   Type II DM, uncontrolled with hyperglycemia with long-term insulin  use with HLD. Last hemoglobin A1c in January 2025 was 8.1.  Patient appears to be only on glipizide  and metformin but no insulin .  Resume home medications.   Bilateral knee pain and right hip pain: Patient and  daughter both concerned about fracture stating that patient has severe osteoporosis and she is at high risk of fractures.  Although there is no history of trauma and on examination, all the joints appeared normal and has good range of motion but per patient and family insistence, we obtained pelvic x-ray, bilateral knee x-ray and fracture was ruled out.  Patient's pain is improved today. PS: Patient is chronically bedbound at baseline.   Chronic HFpEF. Has some swelling in her leg.  Possible third spacing with intravascular volume depletion causing presentation with mild AKI.  Lasix  is on hold for now.   OSA. Continue CPAP nightly.   Morbid obesity Class 3 Body mass index is 49.75 kg/m.  Placing the pt at higher risk of poor outcomes.  Weight loss and diet modification counseled.   Bilateral hip pain knee pain. Likely arthritis. Monitor.   Chronic constipation with abdominal pain. Will continue Bentyl .  Dulcolax suppository.   HLD. Continue statin.   Chronic diabetic neuropathy Continue lyrica   Discharge plan was discussed with patient and/or family member and they verbalized understanding and agreed with it.  Discharge Diagnoses:  Principal Problem:   Severe sepsis [facility]) Active Problems:   Essential hypertension   OSA (obstructive sleep apnea)   Diverticulosis of colon   Pressure injury of skin   Acute pyelonephritis    Discharge Instructions   Allergies as of 06/05/2023       Reactions   Neurontin [gabapentin] Other (See Comments)   Dizziness   Codeine Rash   Fosamax [alendronate Sodium] Other (See Comments)   Upset stomach    Nsaids Other (See Comments)   Abdominal Pain        Medication List     TAKE these medications    acetaminophen  325 MG tablet Commonly known as: TYLENOL  Take 650 mg by mouth every 6 (six) hours as needed for moderate pain or headache.   ALPRAZolam  0.25 MG tablet Commonly known as: XANAX  Take 1 tablet (0.25 mg total) by mouth  2 (two) times daily as needed for anxiety.   apixaban  5 MG Tabs tablet Commonly known as: Eliquis  Take 1 tablet (5 mg total) by mouth 2 (two) times daily.   atorvastatin  20 MG tablet Commonly known as: LIPITOR Take 1 tablet (20 mg total) by mouth daily.   BIOTENE/CALCIUM  MT Take 1 Dose by mouth daily as needed (dry mouth).   cefadroxil 500 MG capsule Commonly known as: DURICEF Take 2 capsules (1,000 mg total) by mouth 2 (two) times daily for 5 days.   desonide  0.05 % cream Commonly known as: DESOWEN  Apply 1 Application topically 2 (two) times daily as needed (dry skin/irritation).   dicyclomine  20 MG tablet Commonly known as: BENTYL  TAKE 1 TABLET(20 MG) BY MOUTH THREE TIMES DAILY AFTER MEALS What changed: See the new instructions.   DULoxetine  60 MG capsule Commonly known as: CYMBALTA  Take 1 capsule (60 mg total) by mouth daily.   furosemide  40 MG tablet Commonly known as: LASIX  Take 1 tablet (40 mg total) by mouth daily.   glipiZIDE  5 MG 24 hr tablet Commonly known as: GLUCOTROL  XL Take 1 tablet (5 mg total) by mouth daily.   HYDROcodone -acetaminophen  10-325 MG  tablet Commonly known as: NORCO Take 1 tablet by mouth daily as needed for severe pain (pain score 7-10).   latanoprost  0.005 % ophthalmic solution Commonly known as: XALATAN  Place 1 drop into both eyes at bedtime.   metFORMIN 1000 MG tablet Commonly known as: GLUCOPHAGE Take 1,000 mg by mouth 2 (two) times daily.   metoprolol  tartrate 50 MG tablet Commonly known as: LOPRESSOR  Take 1 tablet (50 mg total) by mouth 2 (two) times daily.   multivitamin with minerals Tabs tablet Take 1 tablet by mouth daily.   omeprazole  40 MG capsule Commonly known as: PRILOSEC TAKE 1 CAPSULE(40 MG) BY MOUTH DAILY   pregabalin  50 MG capsule Commonly known as: LYRICA  TAKE 1 CAPSULE(50 MG) BY MOUTH TWICE DAILY   psyllium 28 % packet Commonly known as: METAMUCIL SMOOTH TEXTURE Take 1 packet by mouth daily as needed  (constipation).   tirzepatide 5 MG/0.5ML Pen Commonly known as: MOUNJARO Inject 5 mg into the skin once a week.        Follow-up Information     [facility] Follow up.   Specialty: Home Health Services Contact information: [address] [place] [address] [phone]         Graig Lawyer, MD Follow up in 1 week(s).   Specialty: Family Medicine Contact information: [address] [place] [address] [phone]                Allergies  Allergen Reactions   Neurontin [Gabapentin] Other (See Comments)    Dizziness   Codeine Rash   Fosamax [Alendronate Sodium] Other (See Comments)    Upset stomach    Nsaids Other (See Comments)    Abdominal Pain    Consultations: None   Procedures/Studies: CT RENAL ABD W/WO Result Date: 06/03/2023 CLINICAL DATA:  Renal mass suspected * Tracking Code: BO * EXAM: CT ABDOMEN WITHOUT AND WITH CONTRAST TECHNIQUE: Multidetector CT imaging of the abdomen was performed following the standard protocol before and following the bolus administration of intravenous contrast. RADIATION DOSE REDUCTION: This exam was performed according to the departmental dose-optimization program which includes automated exposure control, adjustment of the mA and/or kV according to patient size and/or use of iterative reconstruction technique. CONTRAST:  OMNIPAQUE  IOHEXOL  350 MG/ML SOLN COMPARISON:  CT abdomen pelvis, 06/02/2023 FINDINGS: Lower chest: Trace bilateral pleural effusions. Three-vessel coronary artery calcifications. Hepatobiliary: No focal liver abnormality is seen. Status post cholecystectomy. No biliary dilatation. Pancreas: Unremarkable. No pancreatic ductal dilatation or surrounding inflammatory changes. Spleen: Normal in size without significant abnormality. Adrenals/Urinary Tract: Adrenal glands are unremarkable. Small, calcified involuted right renal cortical cysts, for which no specific  further follow-up or characterization is required. No calculi. Prominent right renal pelvis with some prominent, amorphous dependent hyperdense material, HU = 91, without associated contrast enhancement (series 5, image 71). Adjacent fat stranding. No overt hydronephrosis. Lobulated renal cortices bilaterally. No calculi. Stomach/Bowel: Stomach is within normal limits. No evidence of bowel wall thickening, distention, or inflammatory changes. Vascular/Lymphatic: Aortic atherosclerosis. No enlarged abdominal lymph nodes. Other: No abdominal wall hernia or abnormality. No ascites. Musculoskeletal: No acute or significant osseous findings. IMPRESSION: 1. Prominent right renal pelvis with some prominent, amorphous dependent hyperdense material, without associated contrast enhancement. Adjacent fat stranding. Findings most likely reflect clot and some associated inflammation. No overt hydronephrosis. Recommend short interval follow-up examination to assess for clearing. 2. No suspicious renal mass or contrast enhancement. Small, calcified involuted right renal cortical cysts, for which no specific  further follow-up or characterization is required. 3. Coronary artery disease. Aortic Atherosclerosis (ICD10-I70.0). Electronically Signed   By: Fredricka Jenny M.D.   On: 06/03/2023 20:09   DG HIP UNILAT WITH PELVIS 2-3 VIEWS LEFT Result Date: 06/03/2023 CLINICAL DATA:  Bilateral hip pain. EXAM: DG HIP (WITH OR WITHOUT PELVIS) 2-3V RIGHT; DG HIP (WITH OR WITHOUT PELVIS) 2-3V LEFT COMPARISON:  Reformats from abdominal CT yesterday FINDINGS: The bones are subjectively under mineralized. Advanced right and moderate left hip osteoarthritis with joint space narrowing, osteophytes and subchondral cystic change. No fracture or erosions. No evidence of a vascular necrosis. Pubic symphysis and sacroiliac joints are congruent with mild degenerative sacroiliac spurring. Peripheral vascular calcifications are seen. IMPRESSION: 1.  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 2. Peripheral vascular disease. Electronically Signed   By: Chadwick Colonel M.D.   On: 06/03/2023 15:22   DG HIP UNILAT WITH PELVIS 2-3 VIEWS RIGHT Result Date: 06/03/2023 CLINICAL DATA:  Bilateral hip pain. EXAM: DG HIP (WITH OR WITHOUT PELVIS) 2-3V RIGHT; DG HIP (WITH OR WITHOUT PELVIS) 2-3V LEFT COMPARISON:  Reformats from abdominal CT yesterday FINDINGS: The bones are subjectively under mineralized. Advanced right and moderate left hip osteoarthritis with joint space narrowing, osteophytes and subchondral cystic change. No fracture or erosions. No evidence of a vascular necrosis. Pubic symphysis and sacroiliac joints are congruent with mild degenerative sacroiliac spurring. Peripheral vascular calcifications are seen. IMPRESSION: 1. 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 2. Peripheral vascular disease. Electronically Signed   By: Chadwick Colonel M.D.   On: 06/03/2023 15:22   DG Knee 1-2 Views Left Result Date: 06/03/2023 CLINICAL DATA:  Pain. EXAM: LEFT KNEE - 1-2 VIEW COMPARISON:  None Available. FINDINGS: Technically limited by positioning. The bones are subjectively under mineralized. Advanced osteoarthritis with joint space narrowing, osteophytes and subchondral cystic change. No significant knee joint effusion. No evidence of fracture or focal bone abnormality. Peripheral vascular calcifications. Generalized dermal and subcutaneous calcifications IMPRESSION: 1. Advanced osteoarthritis of the left knee. 2. Peripheral vascular disease. Electronically Signed   By: Chadwick Colonel M.D.   On: 06/03/2023 15:20   DG Knee 1-2 Views Right Result Date: 06/03/2023 CLINICAL DATA:  Pain. EXAM: RIGHT KNEE - 1-2 VIEW COMPARISON:  None Available. FINDINGS: Technically limited due to positioning. The bones are subjectively under mineralized. Advanced osteoarthritis with spurring, subchondral cysts and joint space narrowing. Potential ossified  bodies in the suprapatellar space. Small joint effusion. No evidence of fracture. No obvious erosive change. Peripheral vascular calcifications are seen. Generalized dermal and soft tissue calcifications. IMPRESSION: 1. Advanced osteoarthritis of the right knee. Small joint effusion. 2. Peripheral vascular disease. Electronically Signed   By: Chadwick Colonel M.D.   On: 06/03/2023 15:20   CT ABDOMEN PELVIS WO CONTRAST Result Date: 06/02/2023 CLINICAL DATA:  UTI EXAM: CT ABDOMEN AND PELVIS WITHOUT CONTRAST TECHNIQUE: Multidetector CT imaging of the abdomen and pelvis was performed following the standard protocol without IV contrast. RADIATION DOSE REDUCTION: This exam was performed according to the departmental dose-optimization program which includes automated exposure control, adjustment of the mA and/or kV according to patient size and/or use of iterative reconstruction technique. COMPARISON:  CT stone 03/20/2008 FINDINGS: Lower chest: No acute abnormality. Hepatobiliary: No focal liver abnormality is seen. Status post cholecystectomy. No biliary dilatation. Pancreas: Unremarkable. No pancreatic ductal dilatation or surrounding inflammatory changes. Spleen: Normal in size without focal abnormality. Adrenals/Urinary Tract: There is a vague hyperdensity within the right renal pelvis. The right renal pelvis is dilated with surrounding  inflammatory stranding. Hyperdense area measures approximately 1.7 by by 1.9 x 1.0 cm. There is no hydronephrosis in either kidney. Subcentimeter hyperdense area in the inferior pole left kidney is indeterminate. The adrenal glands and bladder are within normal limits. Stomach/Bowel: Stomach is within normal limits. Appendix appears normal. No evidence of bowel wall thickening, distention, or inflammatory changes. There is sigmoid colon diverticulosis. There is a large amount of stool in the rectum. Vascular/Lymphatic: Aortic atherosclerosis. No enlarged abdominal or pelvic lymph  nodes. Reproductive: The uterus and left adnexa are within normal limits. Calcific density is seen in the right adnexa/right ovary measuring 4.7 x 3.3 cm similar to 2010. Other: There is no ascites. There is a small fat containing umbilical hernia. There is mild body wall edema. Musculoskeletal: Degenerative changes affect the spine and hips. IMPRESSION: 1. Vague hyperdensity within the right renal pelvis with surrounding inflammatory stranding. Findings may represent blood products or calcification, but neoplasm is not excluded. Recommend further evaluation with CT urogram. 2. Subcentimeter hyperdense area in the inferior pole left kidney is indeterminate. This can be further evaluated with CT urogram. 3. Sigmoid colon diverticulosis. 4. Large amount of stool in the rectum. 5. Mild body wall edema. 6. Aortic atherosclerosis. Aortic Atherosclerosis (ICD10-I70.0). Electronically Signed   By: Tyron Gallon M.D.   On: 06/02/2023 20:01   CT Head Wo Contrast Result Date: 06/01/2023 CLINICAL DATA:  Mental status change EXAM: CT HEAD WITHOUT CONTRAST TECHNIQUE: Contiguous axial images were obtained from the base of the skull through the vertex without intravenous contrast. RADIATION DOSE REDUCTION: This exam was performed according to the departmental dose-optimization program which includes automated exposure control, adjustment of the mA and/or kV according to patient size and/or use of iterative reconstruction technique. COMPARISON:  MRI 10/06/2021, CT brain 10/06/2021 FINDINGS: Brain: No acute territorial infarction, hemorrhage or intracranial mass. Atrophy. Mild to moderate patchy white matter hypodensity. Stable ventricle size. Vascular: No hyperdense vessels.  Carotid vascular calcification Skull: Normal. Negative for fracture or focal lesion. Sinuses/Orbits: No acute finding. Other: None IMPRESSION: 1. No CT evidence for acute intracranial abnormality. 2. Atrophy and chronic small vessel ischemic changes of the  white matter. Electronically Signed   By: Esmeralda Hedge M.D.   On: 06/01/2023 18:56   DG Chest Port 1 View Result Date: 06/01/2023 CLINICAL DATA:  Weakness EXAM: PORTABLE CHEST 1 VIEW COMPARISON:  11/23/2006 FINDINGS: Mild bronchitic changes. No consolidation or effusion. Upper normal cardiac size likely augmented by rotation. Aortic atherosclerosis. No pneumothorax IMPRESSION: Mild bronchitic changes. Electronically Signed   By: Esmeralda Hedge M.D.   On: 06/01/2023 18:53   ECHOCARDIOGRAM COMPLETE Result Date: 05/21/2023    ECHOCARDIOGRAM REPORT   Patient Name:   Jade Ellison Date of Exam: 05/21/2023 Medical Rec #:  [id number]        Height:       63.0 in Accession #:    [id number]       Weight:       262.0 lb Date of Birth:  04/23/1945        BSA:          2.169 m Patient Age:    78 years         BP:           110/64 mmHg Patient Gender: F                HR:           91 bpm. Exam Location:  [facility]  Procedure: 2D Echo, 3D Echo, Cardiac Doppler and Color Doppler (Both Spectral            and Color Flow Doppler were utilized during procedure). Indications:    I25.10 CAD  History:        Patient has prior history of Echocardiogram examinations, most                 recent 10/07/2021. CAD, Signs/Symptoms:Shortness of Breath; Risk                 Factors:Sleep Apnea, Hypertension, Diabetes, Dyslipidemia and                 Family History of Coronary Artery Disease. Morbid Obesity with                 Limited Mobility.  Sonographer:    Ewing Holiday RDCS Referring Phys: JONATHAN J BERRY IMPRESSIONS  1. Left ventricular ejection fraction, by estimation, is 55 to 60%. The left ventricle has normal function. The left ventricle has no regional wall motion abnormalities. There is mild left ventricular hypertrophy. Left ventricular diastolic parameters are indeterminate.  2. Right ventricular systolic function was not well visualized. The right ventricular size is not well visualized. There is mildly elevated  pulmonary artery systolic pressure. The estimated right ventricular systolic pressure is 36.9 mmHg.  3. The mitral valve is normal in structure. Trivial mitral valve regurgitation. No evidence of mitral stenosis.  4. The aortic valve was not well visualized. Aortic valve regurgitation is not visualized. No aortic stenosis is present.  5. The inferior vena cava is normal in size with greater than 50% respiratory variability, suggesting right atrial pressure of 3 mmHg. FINDINGS  Left Ventricle: Left ventricular ejection fraction, by estimation, is 55 to 60%. The left ventricle has normal function. The left ventricle has no regional wall motion abnormalities. 3D ejection fraction reviewed and evaluated as part of the interpretation. Alternate measurement of EF is felt to be most reflective of LV function. The left ventricular internal cavity size was normal in size. There is mild left ventricular hypertrophy. Left ventricular diastolic parameters are indeterminate. Right Ventricle: The right ventricular size is not well visualized. Right vetricular wall thickness was not well visualized. Right ventricular systolic function was not well visualized. There is mildly elevated pulmonary artery systolic pressure. The tricuspid regurgitant velocity is 2.91 m/s, and with an assumed right atrial pressure of 3 mmHg, the estimated right ventricular systolic pressure is 36.9 mmHg. Left Atrium: Left atrial size was normal in size. Right Atrium: Right atrial size was normal in size. Pericardium: There is no evidence of pericardial effusion. Mitral Valve: The mitral valve is normal in structure. Trivial mitral valve regurgitation. No evidence of mitral valve stenosis. Tricuspid Valve: The tricuspid valve is normal in structure. Tricuspid valve regurgitation is mild. Aortic Valve: The aortic valve was not well visualized. Aortic valve regurgitation is not visualized. No aortic stenosis is present. Pulmonic Valve: The pulmonic valve  was not well visualized. Pulmonic valve regurgitation is not visualized. Aorta: The aortic root and ascending aorta are structurally normal, with no evidence of dilitation. Venous: The inferior vena cava is normal in size with greater than 50% respiratory variability, suggesting right atrial pressure of 3 mmHg. IAS/Shunts: The interatrial septum was not well visualized.  LEFT VENTRICLE PLAX 2D LVIDd:         3.80 cm   Diastology LVIDs:         2.85 cm   LV  e' medial:    6.42 cm/s LV PW:         1.20 cm   LV E/e' medial:  16.0 LV IVS:        1.20 cm   LV e' lateral:   8.05 cm/s LVOT diam:     2.10 cm   LV E/e' lateral: 12.8 LV SV:         37 LV SV Index:   17 LVOT Area:     3.46 cm                           3D Volume EF:                          3D EF:        41 %                          LV EDV:       122 ml                          LV ESV:       72 ml                          LV SV:        50 ml RIGHT VENTRICLE RV Basal diam:  3.50 cm RV S prime:     9.02 cm/s TAPSE (M-mode): 1.0 cm RVSP:           36.9 mmHg LEFT ATRIUM             Index        RIGHT ATRIUM           Index LA diam:        3.90 cm 1.80 cm/m   RA Pressure: 3.00 mmHg LA Vol (A2C):   88.4 ml 40.76 ml/m  RA Area:     17.80 cm LA Vol (A4C):   44.8 ml 20.63 ml/m  RA Volume:   46.30 ml  21.35 ml/m LA Biplane Vol: 58.0 ml 26.74 ml/m  AORTIC VALVE LVOT Vmax:   60.78 cm/s LVOT Vmean:  40.425 cm/s LVOT VTI:    0.106 m  AORTA Ao Root diam: 3.20 cm Ao Asc diam:  2.90 cm MITRAL VALVE                TRICUSPID VALVE MV Area (PHT)  cm          TR Peak grad:   33.9 mmHg MV Decel Time: 180 msec     TR Vmax:        291.00 cm/s MV E velocity: 103.00 cm/s  Estimated RAP:  3.00 mmHg                             RVSP:           36.9 mmHg                              SHUNTS                             Systemic VTI:  0.11 m  Systemic Diam: 2.10 cm Carson Clara MD Electronically signed by Carson Clara MD Signature Date/Time:  05/21/2023/5:54:46 PM    Final      Discharge Exam: Vitals:   06/05/23 0431 06/05/23 0744  BP: (!) 147/76 (!) 148/92  Pulse: (!) 101 95  Resp: 17 17  Temp: 98.4 F (36.9 C) 97.9 F (36.6 C)  SpO2: 99% 100%   Vitals:   06/04/23 1626 06/04/23 1958 06/05/23 0431 06/05/23 0744  BP: (!) 152/88 (!) 158/82 (!) 147/76 (!) 148/92  Pulse: 94 99 (!) 101 95  Resp: 17 16 17 17   Temp: 98.8 F (37.1 C) 98.3 F (36.8 C) 98.4 F (36.9 C) 97.9 F (36.6 C)  TempSrc: Oral Oral Oral Oral  SpO2: 100% 100% 99% 100%  Weight:      Height:        General: Pt is alert, awake, not in acute distress, morbidly obese Cardiovascular: RRR, S1/S2 +, no rubs, no gallops Respiratory: CTA bilaterally, no wheezing, no rhonchi Abdominal: Soft, NT, ND, bowel sounds + Extremities: no edema, no cyanosis    The results of significant diagnostics from this hospitalization (including imaging, microbiology, ancillary and laboratory) are listed below for reference.     Microbiology: Recent Results (from the past 240 hours)  Culture, blood (routine x 2)     Status: Abnormal   Collection Time: 06/01/23  5:10 PM   Specimen: BLOOD  Result Value Ref Range Status   Specimen Description BLOOD LEFT ANTECUBITAL  Final   Special Requests   Final    BOTTLES DRAWN AEROBIC AND ANAEROBIC Blood Culture results may not be optimal due to an inadequate volume of blood received in culture bottles   Culture  Setup Time   Final    GRAM NEGATIVE RODS AEROBIC BOTTLE ONLY CRITICAL RESULT CALLED TO, READ BACK BY AND VERIFIED WITH: PHARMD E SINCLAIRE [id number] AT 1420 BY CM Performed at [facility], [address] [place], [place] [address]    Culture KLEBSIELLA PNEUMONIAE (A)  Final   Report Status 06/04/2023 FINAL  Final   Organism ID, Bacteria KLEBSIELLA PNEUMONIAE  Final   Organism ID, Bacteria KLEBSIELLA PNEUMONIAE  Final      Susceptibility   Klebsiella pneumoniae - KIRBY BAUER*    CEFAZOLIN SENSITIVE Sensitive     Klebsiella pneumoniae - MIC*    AMPICILLIN >=32 RESISTANT Resistant     CEFEPIME  <=0.12 SENSITIVE Sensitive     CEFTAZIDIME <=1 SENSITIVE Sensitive     CEFTRIAXONE  <=0.25 SENSITIVE Sensitive     CIPROFLOXACIN <=0.25 SENSITIVE Sensitive     GENTAMICIN <=1 SENSITIVE Sensitive     IMIPENEM <=0.25 SENSITIVE Sensitive     TRIMETH/SULFA <=20 SENSITIVE Sensitive     AMPICILLIN/SULBACTAM 4 SENSITIVE Sensitive     PIP/TAZO <=4 SENSITIVE Sensitive ug/mL    * KLEBSIELLA PNEUMONIAE    KLEBSIELLA PNEUMONIAE  Blood Culture ID Panel (Reflexed)     Status: Abnormal   Collection Time: 06/01/23  5:10 PM  Result Value Ref Range Status   Enterococcus faecalis NOT DETECTED NOT DETECTED Final   Enterococcus Faecium NOT DETECTED NOT DETECTED Final   Listeria monocytogenes NOT DETECTED NOT DETECTED Final   Staphylococcus species NOT DETECTED NOT DETECTED Final   Staphylococcus aureus (BCID) NOT DETECTED NOT DETECTED Final   Staphylococcus epidermidis NOT DETECTED NOT DETECTED Final   Staphylococcus lugdunensis NOT DETECTED NOT DETECTED Final   Streptococcus species NOT DETECTED NOT DETECTED Final   Streptococcus agalactiae NOT DETECTED NOT DETECTED Final  Streptococcus pneumoniae NOT DETECTED NOT DETECTED Final   Streptococcus pyogenes NOT DETECTED NOT DETECTED Final   A.calcoaceticus-baumannii NOT DETECTED NOT DETECTED Final   Bacteroides fragilis NOT DETECTED NOT DETECTED Final   Enterobacterales DETECTED (A) NOT DETECTED Final    Comment: Enterobacterales represent a large order of gram negative bacteria, not a single organism. CRITICAL RESULT CALLED TO, READ BACK BY AND VERIFIED WITH: PHARMD E SINCLAIRE [id number] AT 1420 BY CM    Enterobacter cloacae complex NOT DETECTED NOT DETECTED Final   Escherichia coli NOT DETECTED NOT DETECTED Final   Klebsiella aerogenes NOT DETECTED NOT DETECTED Final   Klebsiella oxytoca NOT DETECTED NOT DETECTED Final   Klebsiella pneumoniae DETECTED (A) NOT DETECTED Final     Comment: CRITICAL RESULT CALLED TO, READ BACK BY AND VERIFIED WITH: PHARMD E SINCLAIRE [id number] AT 1420 BY CM    Proteus species NOT DETECTED NOT DETECTED Final   Salmonella species NOT DETECTED NOT DETECTED Final   Serratia marcescens NOT DETECTED NOT DETECTED Final   Haemophilus influenzae NOT DETECTED NOT DETECTED Final   Neisseria meningitidis NOT DETECTED NOT DETECTED Final   Pseudomonas aeruginosa NOT DETECTED NOT DETECTED Final   Stenotrophomonas maltophilia NOT DETECTED NOT DETECTED Final   Candida albicans NOT DETECTED NOT DETECTED Final   Candida auris NOT DETECTED NOT DETECTED Final   Candida glabrata NOT DETECTED NOT DETECTED Final   Candida krusei NOT DETECTED NOT DETECTED Final   Candida parapsilosis NOT DETECTED NOT DETECTED Final   Candida tropicalis NOT DETECTED NOT DETECTED Final   Cryptococcus neoformans/gattii NOT DETECTED NOT DETECTED Final   CTX-M ESBL NOT DETECTED NOT DETECTED Final   Carbapenem resistance IMP NOT DETECTED NOT DETECTED Final   Carbapenem resistance KPC NOT DETECTED NOT DETECTED Final   Carbapenem resistance NDM NOT DETECTED NOT DETECTED Final   Carbapenem resist OXA 48 LIKE NOT DETECTED NOT DETECTED Final   Carbapenem resistance VIM NOT DETECTED NOT DETECTED Final    Comment: Performed at [facility], [address], [place], [place] [address]  Culture, blood (routine x 2)     Status: None (Preliminary result)   Collection Time: 06/01/23  5:21 PM   Specimen: BLOOD  Result Value Ref Range Status   Specimen Description BLOOD SITE NOT SPECIFIED  Final   Special Requests   Final    BOTTLES DRAWN AEROBIC AND ANAEROBIC Blood Culture adequate volume   Culture   Final    NO GROWTH 4 DAYS Performed at [facility], [address] [place], [place] [address]    Report Status PENDING  Incomplete  Urine Culture     Status: Abnormal   Collection Time: 06/01/23  6:34 PM   Specimen: Urine, Random  Result Value Ref Range Status   Specimen  Description URINE, RANDOM  Final   Special Requests   Final    NONE Reflexed from [id number] Performed at [facility], [address], [place], [place] [address]    Culture MULTIPLE SPECIES PRESENT, SUGGEST RECOLLECTION (A)  Final   Report Status 06/03/2023 FINAL  Final  MRSA Next Gen by PCR, Nasal     Status: None   Collection Time: 06/02/23 12:22 AM   Specimen: Nasal Mucosa; Nasal Swab  Result Value Ref Range Status   MRSA by PCR Next Gen NOT DETECTED NOT DETECTED Final    Comment: (NOTE) The GeneXpert MRSA Assay (FDA approved for NASAL specimens only), is one component of a comprehensive MRSA colonization surveillance program.  It is not intended to diagnose MRSA infection nor to guide or monitor treatment for MRSA infections. Test performance is not FDA approved in patients less than 36 years old. Performed at [facility], [address] [place], [place] [address]      Labs: BNP (last 3 results) No results for input(s): "BNP" in the last 8760 hours. Basic Metabolic Panel: Recent Labs  Lab 06/01/23 1710 06/02/23 0840 06/03/23 0908 06/04/23 0623  NA 132* 133* 134* 134*  K 4.2 3.8 3.6 3.6  CL 93* 96* 98 99  CO2 25 26 25 28   GLUCOSE 202* 69* 148* 131*  BUN 22 23 21 14   CREATININE 1.12* 1.02* 0.90 0.86  CALCIUM  9.2 8.7* 8.5* 8.3*  MG  --  1.0* 1.9  --    Liver Function Tests: Recent Labs  Lab 06/01/23 1710 06/02/23 0840  AST 49* 29  ALT 19 17  ALKPHOS 91 69  BILITOT 1.4* 1.1  PROT 7.2 6.3*  ALBUMIN 3.1* 2.6*   Recent Labs  Lab 06/01/23 1710  LIPASE 28   Recent Labs  Lab 06/01/23 1710  AMMONIA 28   CBC: Recent Labs  Lab 06/01/23 1710 06/02/23 0839 06/02/23 1227 06/03/23 0908 06/04/23 0623 06/04/23 1030 06/04/23 2041 06/05/23 0732  WBC 10.0 10.2  --  6.2 5.4  --  5.5 5.0  NEUTROABS 8.8*  --   --  4.9 3.8  --  3.7 3.4  HGB 9.7* 7.9*   < > 8.0* 7.5* 7.3* 8.1* 7.8*  HCT 31.4* 27.1*   < > 27.6* 26.2* 24.9* 27.3* 26.5*  MCV 70.2* 72.3*   --  73.4* 72.6*  --  72.8* 72.2*  PLT 205 171  --  153 151  --  163 155   < > = values in this interval not displayed.   Cardiac Enzymes: No results for input(s): "CKTOTAL", "CKMB", "CKMBINDEX", "TROPONINI" in the last 168 hours. BNP: Invalid input(s): "POCBNP" CBG: Recent Labs  Lab 06/04/23 0752 06/04/23 1205 06/04/23 1628 06/04/23 1956 06/05/23 0743  GLUCAP 132* 174* 171* 150* 146*   D-Dimer No results for input(s): "DDIMER" in the last 72 hours. Hgb A1c Recent Labs    06/03/23 1439  HGBA1C 6.5*   Lipid Profile No results for input(s): "CHOL", "HDL", "LDLCALC", "TRIG", "CHOLHDL", "LDLDIRECT" in the last 72 hours. Thyroid  function studies No results for input(s): "TSH", "T4TOTAL", "T3FREE", "THYROIDAB" in the last 72 hours.  Invalid input(s): "FREET3" Anemia work up Recent Labs    06/02/23 1215  RETICCTPCT 2.0   Urinalysis    Component Value Date/Time   COLORURINE YELLOW 06/01/2023 1834   APPEARANCEUR HAZY (A) 06/01/2023 1834   LABSPEC 1.009 06/01/2023 1834   PHURINE 6.0 06/01/2023 1834   GLUCOSEU NEGATIVE 06/01/2023 1834   HGBUR MODERATE (A) 06/01/2023 1834   BILIRUBINUR NEGATIVE 06/01/2023 1834   KETONESUR NEGATIVE 06/01/2023 1834   PROTEINUR 100 (A) 06/01/2023 1834   UROBILINOGEN 0.2 02/08/2012 0025   NITRITE NEGATIVE 06/01/2023 1834   LEUKOCYTESUR LARGE (A) 06/01/2023 1834   Sepsis Labs Recent Labs  Lab 06/03/23 0908 06/04/23 0623 06/04/23 2041 06/05/23 0732  WBC 6.2 5.4 5.5 5.0   Microbiology Recent Results (from the past 240 hours)  Culture, blood (routine x 2)     Status: Abnormal   Collection Time: 06/01/23  5:10 PM   Specimen: BLOOD  Result Value Ref Range Status   Specimen Description BLOOD LEFT ANTECUBITAL  Final   Special Requests   Final  BOTTLES DRAWN AEROBIC AND ANAEROBIC Blood Culture results may not be optimal due to an inadequate volume of blood received in culture bottles   Culture  Setup Time   Final    GRAM NEGATIVE  RODS AEROBIC BOTTLE ONLY CRITICAL RESULT CALLED TO, READ BACK BY AND VERIFIED WITH: PHARMD E SINCLAIRE [id number] AT 1420 BY CM Performed at [facility], [address], [place], [place] [address]    Culture KLEBSIELLA PNEUMONIAE (A)  Final   Report Status 06/04/2023 FINAL  Final   Organism ID, Bacteria KLEBSIELLA PNEUMONIAE  Final   Organism ID, Bacteria KLEBSIELLA PNEUMONIAE  Final      Susceptibility   Klebsiella pneumoniae - KIRBY BAUER*    CEFAZOLIN SENSITIVE Sensitive    Klebsiella pneumoniae - MIC*    AMPICILLIN >=32 RESISTANT Resistant     CEFEPIME  <=0.12 SENSITIVE Sensitive     CEFTAZIDIME <=1 SENSITIVE Sensitive     CEFTRIAXONE  <=0.25 SENSITIVE Sensitive     CIPROFLOXACIN <=0.25 SENSITIVE Sensitive     GENTAMICIN <=1 SENSITIVE Sensitive     IMIPENEM <=0.25 SENSITIVE Sensitive     TRIMETH/SULFA <=20 SENSITIVE Sensitive     AMPICILLIN/SULBACTAM 4 SENSITIVE Sensitive     PIP/TAZO <=4 SENSITIVE Sensitive ug/mL    * KLEBSIELLA PNEUMONIAE    KLEBSIELLA PNEUMONIAE  Blood Culture ID Panel (Reflexed)     Status: Abnormal   Collection Time: 06/01/23  5:10 PM  Result Value Ref Range Status   Enterococcus faecalis NOT DETECTED NOT DETECTED Final   Enterococcus Faecium NOT DETECTED NOT DETECTED Final   Listeria monocytogenes NOT DETECTED NOT DETECTED Final   Staphylococcus species NOT DETECTED NOT DETECTED Final   Staphylococcus aureus (BCID) NOT DETECTED NOT DETECTED Final   Staphylococcus epidermidis NOT DETECTED NOT DETECTED Final   Staphylococcus lugdunensis NOT DETECTED NOT DETECTED Final   Streptococcus species NOT DETECTED NOT DETECTED Final   Streptococcus agalactiae NOT DETECTED NOT DETECTED Final   Streptococcus pneumoniae NOT DETECTED NOT DETECTED Final   Streptococcus pyogenes NOT DETECTED NOT DETECTED Final   A.calcoaceticus-baumannii NOT DETECTED NOT DETECTED Final   Bacteroides fragilis NOT DETECTED NOT DETECTED Final   Enterobacterales DETECTED (A) NOT  DETECTED Final    Comment: Enterobacterales represent a large order of gram negative bacteria, not a single organism. CRITICAL RESULT CALLED TO, READ BACK BY AND VERIFIED WITH: PHARMD E SINCLAIRE [id number] AT 1420 BY CM    Enterobacter cloacae complex NOT DETECTED NOT DETECTED Final   Escherichia coli NOT DETECTED NOT DETECTED Final   Klebsiella aerogenes NOT DETECTED NOT DETECTED Final   Klebsiella oxytoca NOT DETECTED NOT DETECTED Final   Klebsiella pneumoniae DETECTED (A) NOT DETECTED Final    Comment: CRITICAL RESULT CALLED TO, READ BACK BY AND VERIFIED WITH: PHARMD E SINCLAIRE [id number] AT 1420 BY CM    Proteus species NOT DETECTED NOT DETECTED Final   Salmonella species NOT DETECTED NOT DETECTED Final   Serratia marcescens NOT DETECTED NOT DETECTED Final   Haemophilus influenzae NOT DETECTED NOT DETECTED Final   Neisseria meningitidis NOT DETECTED NOT DETECTED Final   Pseudomonas aeruginosa NOT DETECTED NOT DETECTED Final   Stenotrophomonas maltophilia NOT DETECTED NOT DETECTED Final   Candida albicans NOT DETECTED NOT DETECTED Final   Candida auris NOT DETECTED NOT DETECTED Final   Candida glabrata NOT DETECTED NOT DETECTED Final   Candida krusei NOT DETECTED NOT DETECTED Final   Candida parapsilosis NOT DETECTED NOT DETECTED Final   Candida tropicalis NOT DETECTED NOT DETECTED Final  Cryptococcus neoformans/gattii NOT DETECTED NOT DETECTED Final   CTX-M ESBL NOT DETECTED NOT DETECTED Final   Carbapenem resistance IMP NOT DETECTED NOT DETECTED Final   Carbapenem resistance KPC NOT DETECTED NOT DETECTED Final   Carbapenem resistance NDM NOT DETECTED NOT DETECTED Final   Carbapenem resist OXA 48 LIKE NOT DETECTED NOT DETECTED Final   Carbapenem resistance VIM NOT DETECTED NOT DETECTED Final    Comment: Performed at [facility], [address], [place], [place] [address]  Culture, blood (routine x 2)     Status: None (Preliminary result)   Collection Time: 06/01/23  5:21 PM    Specimen: BLOOD  Result Value Ref Range Status   Specimen Description BLOOD SITE NOT SPECIFIED  Final   Special Requests   Final    BOTTLES DRAWN AEROBIC AND ANAEROBIC Blood Culture adequate volume   Culture   Final    NO GROWTH 4 DAYS Performed at [facility], [address], [place], [place] [address]    Report Status PENDING  Incomplete  Urine Culture     Status: Abnormal   Collection Time: 06/01/23  6:34 PM   Specimen: Urine, Random  Result Value Ref Range Status   Specimen Description URINE, RANDOM  Final   Special Requests   Final    NONE Reflexed from [id number] Performed at [facility], [address] [place], [place] [address]    Culture MULTIPLE SPECIES PRESENT, SUGGEST RECOLLECTION (A)  Final   Report Status 06/03/2023 FINAL  Final  MRSA Next Gen by PCR, Nasal     Status: None   Collection Time: 06/02/23 12:22 AM   Specimen: Nasal Mucosa; Nasal Swab  Result Value Ref Range Status   MRSA by PCR Next Gen NOT DETECTED NOT DETECTED Final    Comment: (NOTE) The GeneXpert MRSA Assay (FDA approved for NASAL specimens only), is one component of a comprehensive MRSA colonization surveillance program. It is not intended to diagnose MRSA infection nor to guide or monitor treatment for MRSA infections. Test performance is not FDA approved in patients less than 45 years old. Performed at [facility], [address] [place], [place] [address]     FURTHER DISCHARGE INSTRUCTIONS:   Get Medicines reviewed and adjusted: Please take all your medications with you for your next visit with your Primary MD   Laboratory/radiological data: Please request your Primary MD to go over all hospital tests and procedure/radiological results at the follow up, please ask your Primary MD to get all Hospital records sent to his/her office.   In some cases, they will be blood work, cultures and biopsy results pending at the time of your discharge. Please request that your  primary care M.D. goes through all the records of your hospital data and follows up on these results.   Also Note the following: If you experience worsening of your admission symptoms, develop shortness of breath, life threatening emergency, suicidal or homicidal thoughts you must seek medical attention immediately by calling 911 or calling your MD immediately  if symptoms less severe.   You must read complete instructions/literature along with all the possible adverse reactions/side effects for all the Medicines you take and that have been prescribed to you. Take any new Medicines after you have completely understood and accpet all the possible adverse reactions/side effects.    Do not drive when taking Pain medications or sleeping medications (Benzodaizepines)   Do not take more than prescribed Pain, Sleep and  Anxiety Medications. It is not advisable to combine anxiety,sleep and pain medications without talking with your primary care practitioner   Special Instructions: If you have smoked or chewed Tobacco  in the last 2 yrs please stop smoking, stop any regular Alcohol  and or any Recreational drug use.   Wear Seat belts while driving.   Please note: You were cared for by a hospitalist during your hospital stay. Once you are discharged, your primary care physician will handle any further medical issues. Please note that NO REFILLS for any discharge medications will be authorized once you are discharged, as it is imperative that you return to your primary care physician (or establish a relationship with a primary care physician if you do not have one) for your post hospital discharge needs so that they can reassess your need for medications and monitor your lab values  Time coordinating discharge: Over 30 minutes  SIGNED:   Modena Andes, MD  [facility] 06/05/2023, 9:46 AM *Please note that this is a verbal dictation therefore any spelling or grammatical errors are due to the  "Dragon Medical One" system interpretation. If 7PM-7AM, please contact night-coverage [email or web address]

## 2023-06-05 NOTE — TOC Progression Note (Addendum)
 Transition of Care (TOC) - Progression Note   Patient for discharge today, requires PTAR. Confined address with patient. Patient stated daughter Jade Ellison took her boys to daycare will be back at the hospital in a hour. Per patient Jade Ellison does not want PTAR called until she returns to hospital. PTAR paperwork on chart. Nurse and discharge lounge staff aware   Messaged Randel Buss with [facility] regarding discharge today   1311 Nurse reported daughter at bedside and ready for PTAR to be called. NCM called PTAR , paperwork on chart , long wait   Provided transportation resources to AVS and asked nurse to reprint. Also provide hard copy of some resources to daughter . Also encouraged daughter to call number on insurance card to see if she has transportation benefit.  Daughter asking if patient does not make it to her PCP follow up appointment if [facility] can draw labs. Randel Buss with [facility] explained if PCP gives them orders to draw labs they can, however usually PCP wants to see patient before ordering labs. Daughter aware   Patient Details  Name: Jade Ellison MRN: [id number] Date of Birth: 11-01-1945  Transition of Care [facility]) CM/SW Contact  Jade Ellison, Jade Late, RN Phone Number: 06/05/2023, 10:22 AM  Clinical Narrative:       Expected Discharge Plan: Home w Home Health Services Barriers to Discharge: Continued Medical Work up  Expected Discharge Plan and Services   Discharge Planning Services: CM Consult Post Acute Care Choice: Home Health Living arrangements for the past 2 months: Single Family Home Expected Discharge Date: 06/05/23               DME Arranged: N/A         HH Arranged: RN, PT, OT HH Agency: [facility] Date [facility] Agency Contacted: 06/03/23 Time HH Agency Contacted: 1039 Representative spoke with at [facility] Agency: Randel Buss   Social Determinants of Health (SDOH) Interventions SDOH Screenings   Food Insecurity: No Food Insecurity (06/01/2023)  Housing: Low Risk  (06/01/2023)   Transportation Needs: No Transportation Needs (06/01/2023)  Utilities: Not At Risk (06/01/2023)  Alcohol Screen: Low Risk  (08/22/2022)  Depression (PHQ2-9): Low Risk  (11/18/2022)  Financial Resource Strain: Low Risk  (08/22/2022)  Physical Activity: Sufficiently Active (08/22/2022)  Social Connections: Socially Isolated (06/01/2023)  Stress: No Stress Concern Present (08/22/2022)  Tobacco Use: Low Risk  (06/02/2023)  Health Literacy: Adequate Health Literacy (08/22/2022)    Readmission Risk Interventions     No data to display

## 2023-06-05 NOTE — Progress Notes (Signed)
 Physical Therapy Treatment Patient Details Name: Jade Ellison MRN: [id number] DOB: 1945-12-10 Today's Date: 06/05/2023   History of Present Illness Patient is 78 y.o. female who presents to the ER from home due to AMS with dysuria. In ED UA positive for pyuria, pt tachycardic and Tmax of 103.1. 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    PT Comments  Pt received and agreeable to session with daughter and granddaughter present for training. Session focused on bed mobility and caregiver training. Pt requires max A +2 to roll L/R and is noted to have a BM upon rolling. Pt requires total A for hygiene tasks with pt's daughter and granddaughter assisting. Pt appears short of breath in sidelying causing limited tolerance. Pt's daughter reports having a lift at home. Education provided on placement of lift pad in supine and using it to get pt to EOB. Encouraged daughter to have HHPT demonstrate use and safe techniques with lift. Pt continues to benefit from PT services to progress toward functional mobility goals.    If plan is discharge home, recommend the following: Two people to help with walking and/or transfers;Two people to help with bathing/dressing/bathroom;Assistance with cooking/housework;Assist for transportation;Help with stairs or ramp for entrance   Can travel by private vehicle        Equipment Recommendations  Hospital bed;Hoyer lift    Recommendations for Other Services       Precautions / Restrictions Precautions Precautions: Fall Recall of Precautions/Restrictions: Intact Restrictions Weight Bearing Restrictions Per Provider Order: No     Mobility  Bed Mobility Overal bed mobility: Needs Assistance Bed Mobility: Rolling Rolling: Max assist, +2 for  physical assistance, Used rails         General bed mobility comments: Pt rolling L/R multiple times for hygiene and linen change with improved abiltity to roll L compared to R    Transfers                        Ambulation/Gait                   Stairs             Wheelchair Mobility     Tilt Bed    Modified Rankin (Stroke Patients Only)       Balance       Sitting balance - Comments: nt                                    Communication Communication Communication: No apparent difficulties  Cognition Arousal: Alert Behavior During Therapy: WFL for tasks assessed/performed   PT - Cognitive impairments: No apparent impairments                         Following commands: Intact      Cueing Cueing Techniques: Verbal cues  Exercises      General Comments General comments (skin integrity, edema, etc.): daughter and granddaughter present and assisting with hygiene tasks      Pertinent Vitals/Pain Pain Assessment Pain Assessment: No/denies pain     PT Goals (current goals can now be found in the care plan section) Acute Rehab PT Goals Patient Stated Goal:  return home, hip not to hurt PT Goal Formulation: With patient Time For Goal Achievement: 06/16/23 Progress towards PT goals: Progressing toward goals    Frequency    Min 1X/week       AM-PAC PT "6 Clicks" Mobility   Outcome Measure  Help needed turning from your back to your side while in a flat bed without using bedrails?: Total Help needed moving from lying on your back to sitting on the side of a flat bed without using bedrails?: Total Help needed moving to and from a bed to a chair (including a wheelchair)?: Total Help needed standing up from a chair using your arms (e.g., wheelchair or bedside chair)?: Total Help needed to walk in hospital room?: Total Help needed climbing 3-5 steps with a railing? : Total 6 Click Score: 6    End of  Session   Activity Tolerance: Patient tolerated treatment well Patient left: in bed;with call bell/phone within reach;with family/visitor present Nurse Communication: Mobility status;Need for lift equipment PT Visit Diagnosis: Other abnormalities of gait and mobility (R26.89);Muscle weakness (generalized) (M62.81);Difficulty in walking, not elsewhere classified (R26.2);Pain     Time: 1200-1231 PT Time Calculation (min) (ACUTE ONLY): 31 min  Charges:    $Therapeutic Activity: 23-37 mins PT General Charges $$ ACUTE PT VISIT: 1 Visit                     Michaelle Adolphus, PTA Acute Rehabilitation Services Secure Chat Preferred  [phone]    Michaelle Adolphus 06/05/2023, 12:59 PM

## 2023-06-06 LAB — CULTURE, BLOOD (ROUTINE X 2)
Culture: NO GROWTH
Special Requests: ADEQUATE

## 2023-06-09 ENCOUNTER — Telehealth: Payer: Self-pay

## 2023-06-09 ENCOUNTER — Encounter: Payer: Self-pay | Admitting: *Deleted

## 2023-06-09 NOTE — Transitions of Care (Post Inpatient/ED Visit) (Signed)
   06/09/2023  Name: Jade Ellison MRN: [id number] DOB: 1945/03/24  Today's TOC FU Call Status: Today's TOC FU Call Status:: Unsuccessful Call (1st Attempt)  Attempted to reach the patient regarding the most recent Inpatient/ED visit.  Follow Up Plan: Additional outreach attempts will be made to reach the patient to complete the Transitions of Care (Post Inpatient/ED visit) call.   Percy Comp J. Karren Newland RN, MSN [facility], [facility] Health RN Care Manager Direct Dial: [phone]  Fax: [phone] Website: [email or web address]

## 2023-06-09 NOTE — Telephone Encounter (Signed)
 This encounter was created in error - please disregard.

## 2023-06-10 ENCOUNTER — Telehealth: Payer: Self-pay

## 2023-06-10 NOTE — Transitions of Care (Post Inpatient/ED Visit) (Signed)
   06/10/2023  Name: Jade Ellison MRN: [id number] DOB: [birth year]  Today's TOC FU Call Status:    Attempted to reach the patient regarding the most recent Inpatient/ED visit.  Follow Up Plan: Additional outreach attempts will be made to reach the patient to complete the Transitions of Care (Post Inpatient/ED visit) call.   Ahmod Gillespie J. Zameer Borman RN, MSN [facility], [facility] Health RN Care Manager Direct Dial: [phone]  Fax: [phone] Website: [email or web address]

## 2023-06-11 ENCOUNTER — Telehealth: Payer: Self-pay

## 2023-06-11 DIAGNOSIS — R6520 Severe sepsis without septic shock: Secondary | ICD-10-CM

## 2023-06-11 DIAGNOSIS — L89159 Pressure ulcer of sacral region, unspecified stage: Secondary | ICD-10-CM

## 2023-06-11 DIAGNOSIS — N10 Acute tubulo-interstitial nephritis: Secondary | ICD-10-CM

## 2023-06-11 DIAGNOSIS — F330 Major depressive disorder, recurrent, mild: Secondary | ICD-10-CM

## 2023-06-11 DIAGNOSIS — A419 Sepsis, unspecified organism: Secondary | ICD-10-CM

## 2023-06-11 NOTE — Telephone Encounter (Signed)
 Copied from CRM [id number]. Topic: General - Other >> Jun 10, 2023  4:56 PM Baldo Levan wrote: Reason for CRM: Tyra Galley From [facility] called in stating that home care is now in with the patient and requesting the nursing PT and OT orders be signed. She stated she is not seeing any issues other than that she just wanted to let the doctor know the patients glucose monitor is not working so she is not currently testing her blood sugars. If any questions please contact Tyra Galley at [phone]

## 2023-06-11 NOTE — Telephone Encounter (Signed)
 Left detailed VM with fax number to send orders to be signed.  Dm/cma

## 2023-06-12 NOTE — Transitions of Care (Post Inpatient/ED Visit) (Signed)
 06/12/2023  Name: Jade Ellison MRN: [id number] DOB: 05-29-1945  Today's TOC FU Call Status: Today's TOC FU Call Status:: Successful TOC FU Call Completed TOC FU Call Complete Date: 06/11/23 Patient's Name and Date of Birth confirmed.  Transition Care Management Follow-up Telephone Call Discharge Facility: [facility]) Type of Discharge: Inpatient Admission How have you been since you were released from the hospital?: Better (Feeling better. no urinary symtoms) Any questions or concerns?: No  Items Reviewed: Did you receive and understand the discharge instructions provided?: Yes Medications obtained,verified, and reconciled?: Yes (Medications Reviewed) Any new allergies since your discharge?: No Dietary orders reviewed?: Yes Type of Diet Ordered:: heart healthy carb modified. Do you have support at home?: Yes People in Home [RPT]: child(ren), adult, grandchild(ren)  Medications Reviewed Today: Medications Reviewed Today     Reviewed by Vanetta Generous, RN (Registered Nurse) on 06/11/23 at 1218  Med List Status: <None>   Medication Order Taking? Sig Documenting Provider Last Dose Status Informant  acetaminophen  (TYLENOL ) 325 MG tablet [id number] Yes Take 650 mg by mouth every 6 (six) hours as needed for moderate pain or headache. [provider] Taking Active Self, Child, Pharmacy Records  ALPRAZolam  (XANAX ) 0.25 MG tablet [id number] Yes Take 1 tablet (0.25 mg total) by mouth 2 (two) times daily as needed for anxiety. Graig Lawyer, MD Taking Active Self, Child, Pharmacy Records  apixaban  (ELIQUIS ) 5 MG TABS tablet [id number] Yes Take 1 tablet (5 mg total) by mouth 2 (two) times daily. Graig Lawyer, MD Taking Active Self, Child, Pharmacy Records  atorvastatin  (LIPITOR) 20 MG tablet [id number] Yes Take 1 tablet (20 mg total) by mouth daily. Graig Lawyer, MD Taking Active Self, Child, Pharmacy Records  cefadroxil (DURICEF) 1 g tablet [id number] Yes Take 1 g by mouth  2 (two) times daily. [provider] Taking Active            Med Note (ROSE, Margrette Shield Jun 11, 2023 12:18 PM) Takes for 5 days.   desonide  (DESOWEN ) 0.05 % cream [id number] Yes Apply 1 Application topically 2 (two) times daily as needed (dry skin/irritation). Graig Lawyer, MD Taking Active Self, Child, Pharmacy Records  dicyclomine  (BENTYL ) 20 MG tablet [id number] Yes TAKE 1 TABLET(20 MG) BY MOUTH THREE TIMES DAILY AFTER MEALS  Patient taking differently: Take 20 mg by mouth 2 (two) times daily.   Nandigam, Kavitha V, MD Taking Active Self, Child, Pharmacy Records  DULoxetine  (CYMBALTA ) 60 MG capsule [id number] Yes Take 1 capsule (60 mg total) by mouth daily. Graig Lawyer, MD Taking Active Self, Child, Pharmacy Records  furosemide  (LASIX ) 40 MG tablet [id number] Yes Take 1 tablet (40 mg total) by mouth daily. Jude Norton, NP Taking Active Self, Child, Pharmacy Records  glipiZIDE  (GLUCOTROL  XL) 5 MG 24 hr tablet [id number] Yes Take 1 tablet (5 mg total) by mouth daily. Graig Lawyer, MD Taking Active Self, Child, Pharmacy Records  HYDROcodone -acetaminophen  [facility]) 10-325 MG tablet [id number] Yes Take 1 tablet by mouth daily as needed for severe pain (pain score 7-10). Graig Lawyer, MD Taking Active Self, Child, Pharmacy Records  latanoprost  (XALATAN ) 0.005 % ophthalmic solution [id number] Yes Place 1 drop into both eyes at bedtime.  [provider] Taking Active Self, Child, Pharmacy Records  metFORMIN (GLUCOPHAGE) 1000 MG tablet [id number] Yes Take 1,000 mg by mouth 2 (two) times daily. [provider] Taking Active Self, Child, Pharmacy Records  metoprolol  tartrate (LOPRESSOR ) 50 MG tablet [id number]  Yes Take 1 tablet (50 mg total) by mouth 2 (two) times daily. Graig Lawyer, MD Taking Active Self, Child, Pharmacy Records  Mouthwashes (BIOTENE/CALCIUM  [place]) [id number] Yes Take 1 Dose by mouth daily as needed (dry mouth).  [provider] Taking Active Self,  Child, Pharmacy Records  Multiple Vitamin (MULTIVITAMIN WITH MINERALS) TABS tablet [id number] Yes Take 1 tablet by mouth daily. [provider] Taking Active Self, Child, Pharmacy Records  omeprazole  (PRILOSEC) 40 MG capsule [id number] Yes TAKE 1 CAPSULE(40 MG) BY MOUTH DAILY Graig Lawyer, MD Taking Active Self, Child, Pharmacy Records  pregabalin  (LYRICA ) 50 MG capsule [id number] Yes TAKE 1 CAPSULE(50 MG) BY MOUTH TWICE DAILY Rudd, Malcolm Scrivener, MD Taking Active Self, Child, Pharmacy Records  psyllium (METAMUCIL SMOOTH TEXTURE) 28 % packet [id number] Yes Take 1 packet by mouth daily as needed (constipation).  [provider] Taking Active Self, Child, Pharmacy Records           Med Note Dalton Duff Oct 07, 2021  8:46 AM)    tirzepatide (MOUNJARO) 5 MG/0.5ML Pen [id number] Yes Inject 5 mg into the skin once a week. Graig Lawyer, MD Taking Active Self, Child, Pharmacy Records           Med Note [place], [place] R   Thu May 21, 2023  2:39 PM)              Home Care and Equipment/Supplies: Were Home Health Services Ordered?: Yes [facility] Agency[facility] Has Agency set up a time to come to your home?: Yes First Home Health Visit Date: 06/10/23 Any new equipment or medical supplies ordered?: No  Functional Questionnaire: Do you need assistance with bathing/showering or dressing?: Yes (family) Do you need assistance with meal preparation?: Yes (family) Do you need assistance with eating?: No Do you have difficulty maintaining continence: Yes (pure wick/ incontient ofstoll and daughter assist) Do you need assistance with getting out of bed/getting out of a chair/moving?: Yes (has an adjustable bed. can sit on the side of the bed. but otherwise bed bound.) Do you have difficulty managing or taking your medications?: Yes (family)  Follow up appointments reviewed: PCP Follow-up appointment confirmed?: Yes Date of PCP follow-up appointment?:  06/19/23 Follow-up Provider: Dr. Therese Flash Specialist [facility] Follow-up appointment confirmed?: NA Do you need transportation to your follow-up appointment?: Yes (daughter is trying to get a virtual visit due to patient being bed bound.) Do you understand care options if your condition(s) worsen?: Yes-patient verbalized understanding  SDOH Interventions Today    Flowsheet Row Most Recent Value  SDOH Interventions   Food Insecurity Interventions Intervention Not Indicated  Housing Interventions Intervention Not Indicated  Transportation Interventions Other (Comment)  [patient is mostly bed bound. unable to ambulate,]  Utilities Interventions Intervention Not Indicated      06/11/2023    Long discussion with daughter Jade Ellison.  Daughter reports struggles with transportation of mostly bed bound patient.  Daughter concerned about in person MD follow up. Message PCP about daughters concern.   06/12/2023  In basket message from PCP that states that PCP would prefer in person visit to do a complete assessment. Informed daughter and daughter prefers a virtual visit and will make all efforts for future appointments to be in person.  Home health  nurse and OT have started with PT to start next week.  Daughter is hoping that patient will be stronger soon.  Daughter denies need for [facility] program.  Daughter has accepted  LCSW referral for help in the home and transportation.  I have provided my contact information for daughter to call me if needed.   Referral to LCSW Orpha Blade, RN, BSN, CEN Population Health- Transition of Care Team.  [facility] [phone]

## 2023-06-16 ENCOUNTER — Telehealth: Payer: Self-pay

## 2023-06-16 ENCOUNTER — Other Ambulatory Visit: Payer: Self-pay | Admitting: Family Medicine

## 2023-06-16 NOTE — Telephone Encounter (Signed)
 Spoke to, [place], patient's daughter, she has been out of Metformin for 2 weeks.   Can you please and thank you send it into the pharmacy for her? Thanks. Dm/cma

## 2023-06-16 NOTE — Telephone Encounter (Signed)
 Copied from CRM [id number]. Topic: Appointments - Appointment Scheduling >> Jun 16, 2023  3:16 PM Artemio Larry wrote: Patient daughter Aleen Ammons would like to know if the visit on Friday 06/19/23 with Dr. Therese Flash can be virtual. It's scheduled as office visit for a referral to home health but referral is no longer needed and needs to be scheduled as a hospital follow up from her admission at [facility] 06/01/23. Patient has mobility issues and it's hard for her to get around. Please all Aleen Ammons at [phone] to let her know if appt can be virtual. I let her know that hospital follow ups have to be in person and she insisted on checking with Dr. Therese Flash first.

## 2023-06-16 NOTE — Telephone Encounter (Signed)
 Spoke to [employer], with verbal okay given to order Social worker to help with transportation and PT orders.   Dm/cma

## 2023-06-16 NOTE — Telephone Encounter (Signed)
 Spoke to Dr Therese Flash and it is okay for a virtual if she is unable to get out of the house.  Can you please and thank you change the appointment for me.  Patients daughter is notified. Dm/cma

## 2023-06-16 NOTE — Telephone Encounter (Signed)
 Copied from CRM [id number]. Topic: Clinical - Home Health Verbal Orders >> Jun 16, 2023  4:25 PM Albertha Alosa wrote: Caller/Agency: Jola Nash / PT  Callback Number: [phone] Service Requested: Physical Therapy Frequency: 2 time a week for 4 weeks 1 time a week for 4 weeks  Any new concerns about the patient? Yes, heart rate is in the mid 90s and no symptoms, reports some constipation, wanted to know the option

## 2023-06-16 NOTE — Progress Notes (Signed)
 Complex Care Management Note  Care Guide Note 06/16/2023 Name: [name] MRN: [id number] DOB: 1945/05/18  Jade Ellison is a 78 y.o. year old female who sees Rudd, Malcolm Scrivener, MD for primary care. I reached out to Jade Ellison by phone today to offer complex care management services.  Jade Ellison was given information about Complex Care Management services today including:   The Complex Care Management services include support from the care team which includes your Nurse Care Manager, Clinical Social Worker, or Pharmacist.  The Complex Care Management team is here to help remove barriers to the health concerns and goals most important to you. Complex Care Management services are voluntary, and the patient may decline or stop services at any time by request to their care team member.   Complex Care Management Consent Status: Patient agreed to services and verbal consent obtained.   Follow up plan:  Telephone appointment with complex care management team member scheduled for:  06/17/23 at 3:00 p.m.   Encounter Outcome:  Patient Scheduled  [facility]  [facility], [facility] Care Management Assistant Direct Dial: [phone]  Fax: [phone]

## 2023-06-17 ENCOUNTER — Other Ambulatory Visit: Payer: Self-pay | Admitting: Licensed Clinical Social Worker

## 2023-06-17 NOTE — Patient Outreach (Signed)
 Complex Care Management   Visit Note  06/17/2023  Name:  Jade Ellison MRN: [id number] DOB: 12/15/1945  Situation: Referral received for Complex Care Management related to SDOH Barriers:  Transportation I obtained verbal consent from Caregiver.  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 pylori)    Hemorrhoids    Hernia of unspecified site of abdominal cavity without mention of obstruction or gangrene    History of blood transfusion    History of kidney stones    Hypertension 09 07 2013   TRANSTHORACIC ECHO STUDY CONCLUSIONS    Hypertension 09 07 2013   EJECTION FRACTION- 55%-60% .WALL MOTION WAS NORMAL   Impaired mobility    Iron  deficiency anemia    Morbid obesity (HCC)    Neuropathy    Obesity    Olecranon bursitis of left elbow    OSA (obstructive sleep apnea)    On cpap   Osteoarthritis    right hip   Osteoporosis    Primary osteoarthritis    Bilaterally (knee)   PSVT (paroxysmal supraventricular tachycardia) (HCC)    PSVT (paroxysmal supraventricular tachycardia) (HCC)    Short-term memory loss    Shortness of breath dyspnea    Sleep apnea    on CPAP- does not use machine   Spinal headache 1986   Type 2 diabetes mellitus (HCC)     Assessment: Patient Reported Symptoms:  Cognitive Cognitive Status: Unable to Assess Cognitive/Intellectual Conditions Management [RPT]: Not Assessed      Neurological Neurological Review of  Symptoms: Not assessed    HEENT HEENT Symptoms Reported: Not assessed      Cardiovascular   Cardiovascular Conditions: Hypertension, High blood cholesterol Cardiovascular Management Strategies: Medication therapy  Respiratory Respiratory Symptoms Reported: No symptoms reported Respiratory Conditions: Asthma  Endocrine Patient reports the following symptoms related to hypoglycemia or hyperglycemia : No symptoms reported Is patient diabetic?: Yes Is patient checking blood sugars at home?: Yes Endocrine Conditions: Diabetes Endocrine Management Strategies: Diet modification, Adequate rest, Medication therapy  Gastrointestinal Gastrointestinal Symptoms Reported: Constipation, Incontinence Gastrointestinal Conditions: Constipation    Genitourinary Genitourinary Symptoms Reported: Incontinence Genitourinary Conditions: Incontinence  Integumentary Integumentary Symptoms Reported: Not assessed    Musculoskeletal Musculoskelatal Symptoms Reviewed: Difficulty walking, Weakness Musculoskeletal Conditions: Joint pain Falls in the past year?: No Number of falls in past year: 1 or less Was there an injury with Fall?: No Fall Risk Category Calculator: 0 Patient Fall Risk Level: Low Fall Risk    Psychosocial Psychosocial Symptoms Reported: Not assessed     Quality of Family Relationships: helpful, supportive, involved Do you feel physically threatened by others?: No      11/18/2022    1:21 PM  Depression screen PHQ 2/9  Decreased Interest 0  Down, Depressed, Hopeless 1  PHQ - 2  Score 1    There were no vitals filed for this visit.  Medications Reviewed Today     Reviewed by Fletcher Humble, LCSW (Social Worker) on 06/17/23 at 1709  Med List Status: <None>   Medication Order Taking? Sig Documenting Provider Last Dose Status Informant  acetaminophen  (TYLENOL ) 325 MG tablet [id number] No Take 650 mg by mouth every 6 (six) hours as needed for moderate pain or headache. [provider] Taking Active Self, Child, Pharmacy Records  ALPRAZolam  (XANAX ) 0.25 MG tablet [id number] No Take 1 tablet (0.25 mg total) by mouth 2 (two) times daily as needed for anxiety. Graig Lawyer, MD Taking Active Self, Child, Pharmacy Records  apixaban  (ELIQUIS ) 5 MG TABS tablet [id number] No Take 1 tablet (5 mg total) by mouth 2 (two) times daily. Graig Lawyer, MD Taking Active Self, Child, Pharmacy Records  atorvastatin  (LIPITOR) 20 MG tablet [id number] No Take 1 tablet (20 mg total) by mouth daily. Graig Lawyer, MD Taking Active Self, Child, Pharmacy Records  cefadroxil  (DURICEF) 1 g tablet [id number] No Take 1 g by mouth 2 (two) times daily. [provider] Taking Active            Med Note (ROSE, Margrette Shield Jun 11, 2023 12:18 PM) Takes for 5 days.   desonide  (DESOWEN ) 0.05 % cream [id number] No Apply 1 Application topically 2 (two) times daily as needed (dry skin/irritation). Graig Lawyer, MD Taking Active Self, Child, Pharmacy Records  dicyclomine  (BENTYL ) 20 MG tablet [id number] No TAKE 1 TABLET(20 MG) BY MOUTH THREE TIMES DAILY AFTER MEALS  Patient taking differently: Take 20 mg by mouth 2 (two) times daily.   Nandigam, Kavitha V, MD Taking Active Self, Child, Pharmacy Records  DULoxetine  (CYMBALTA ) 60 MG capsule [id number] No Take 1 capsule (60 mg total) by mouth daily. Graig Lawyer, MD Taking Active Self, Child, Pharmacy Records  furosemide  (LASIX ) 40 MG tablet [id number] No Take 1 tablet (40 mg total) by mouth daily. Jude Norton, NP Taking Active Self, Child, Pharmacy Records  glipiZIDE  (GLUCOTROL  XL) 5 MG 24 hr tablet [id number] No Take 1 tablet (5 mg total) by mouth daily. Graig Lawyer, MD Taking Active Self, Child, Pharmacy Records  HYDROcodone -acetaminophen  [facility]) 10-325 MG tablet [id number] No Take 1 tablet by mouth daily as needed for severe pain (pain score 7-10). Graig Lawyer, MD Taking Active Self, Child, Pharmacy Records  latanoprost  (XALATAN )  0.005 % ophthalmic solution [id number] No Place 1 drop into both eyes at bedtime.  [provider] Taking Active Self, Child, Pharmacy Records  metFORMIN (GLUCOPHAGE) 1000 MG tablet [id number]  TAKE 1 TABLET BY MOUTH TWICE DAILY Graig Lawyer, MD  Active   metoprolol  tartrate (LOPRESSOR ) 50 MG tablet [id number] No Take 1 tablet (50 mg total) by mouth 2 (two) times daily. Graig Lawyer, MD Taking Active Self, Child, Pharmacy Records  Mouthwashes [facility]  [place]) [id number] No Take 1 Dose by mouth daily as needed (dry mouth).  [provider] Taking Active Self, Child, Pharmacy Records  Multiple Vitamin (MULTIVITAMIN WITH MINERALS) TABS tablet [id number] No Take 1 tablet by mouth daily. [provider] Taking Active Self, Child, Pharmacy Records  omeprazole  (PRILOSEC) 40 MG capsule [id number] No TAKE 1 CAPSULE(40 MG) BY MOUTH DAILY Graig Lawyer, MD Taking Active Self, Child, Pharmacy Records  pregabalin  (LYRICA ) 50 MG capsule [id number] No TAKE 1 CAPSULE(50 MG) BY MOUTH TWICE DAILY Rudd, Malcolm Scrivener, MD Taking Active Self, Child, Pharmacy Records  psyllium (METAMUCIL SMOOTH TEXTURE) 28 % packet [id number] No Take 1 packet by mouth daily as needed (constipation).  [provider] Taking Active Self, Child, Pharmacy Records           Med Note Dalton Duff Oct 07, 2021  8:46 AM)    tirzepatide (MOUNJARO) 5 MG/0.5ML Pen [id number] No Inject 5 mg into the skin once a week. Graig Lawyer, MD Taking Active Self, Child, Pharmacy Records           Med Note Verdia Glad, [place] R   Thu May 21, 2023  2:39 PM)              Recommendation:   Contact [employer] for information and assistance with install wheelchair hitch on personal vehicle   Follow Up Plan:   Telephone follow up appointment date/time:  07/01/2023   Fletcher Humble MSW, LCSW Licensed Clinical Social Worker  [facility], Population Health Direct Dial:  [phone]  Fax: [phone]

## 2023-06-17 NOTE — Patient Instructions (Signed)
 Visit Information  Thank you for taking time to visit with me today. Please don't hesitate to contact me if I can be of assistance to you before our next scheduled appointment.  Your next care management appointment is by telephone on 07/01/2023 at 3pm  Telephone follow up appointment date/time:  07/01/2023  3pm  Please call the care guide team at [phone] if you need to cancel, schedule, or reschedule an appointment.   Please call 911 if you are experiencing a Mental Health or Behavioral Health Crisis or need someone to talk to.  Fletcher Humble MSW, LCSW Licensed Clinical Social Worker  [facility], Population Health Direct Dial: [phone]  Fax: [phone]

## 2023-06-18 ENCOUNTER — Telehealth: Payer: Self-pay | Admitting: Family Medicine

## 2023-06-18 ENCOUNTER — Telehealth: Payer: Self-pay

## 2023-06-18 NOTE — Telephone Encounter (Signed)
 Left VM to rtn call to discuss this message.  Dm/cma

## 2023-06-18 NOTE — Telephone Encounter (Signed)
 Copied from CRM [id number]. Topic: Clinical - Home Health Verbal Orders >> Jun 17, 2023 12:36 PM Adonis Hoot wrote: Caller/Agency: [facility] Callback Number: [phone] Service Requested:  Any new concerns about the patient? Yes Patent needs a new glucometer sent to [facility] on file -Finished antibiotic for UTI,usually gets yeast infection afterwards,would like to know if diflucan could be prescribed for patient -Order for in and out cath to obtain urine ,request by daughter for her to have urine and lab work done  by [employer] make sure UTI has cleared

## 2023-06-18 NOTE — Telephone Encounter (Unsigned)
 Copied from CRM [id number]. Topic: General - Other >> Jun 18, 2023  9:04 AM Danae Duncans wrote: Reason for CRM: April- [facility] returning missed call for [place] - contacted [facility]- denise is not in office-April can be reached [phone]

## 2023-06-19 ENCOUNTER — Telehealth (INDEPENDENT_AMBULATORY_CARE_PROVIDER_SITE_OTHER): Payer: Medicare (Managed Care) | Admitting: Family Medicine

## 2023-06-19 ENCOUNTER — Telehealth: Payer: Self-pay | Admitting: Family Medicine

## 2023-06-19 ENCOUNTER — Encounter: Payer: Self-pay | Admitting: Family Medicine

## 2023-06-19 VITALS — Ht 63.0 in

## 2023-06-19 DIAGNOSIS — I130 Hypertensive heart and chronic kidney disease with heart failure and stage 1 through stage 4 chronic kidney disease, or unspecified chronic kidney disease: Secondary | ICD-10-CM | POA: Diagnosis not present

## 2023-06-19 DIAGNOSIS — Z7984 Long term (current) use of oral hypoglycemic drugs: Secondary | ICD-10-CM

## 2023-06-19 DIAGNOSIS — N10 Acute tubulo-interstitial nephritis: Secondary | ICD-10-CM | POA: Diagnosis not present

## 2023-06-19 DIAGNOSIS — I2510 Atherosclerotic heart disease of native coronary artery without angina pectoris: Secondary | ICD-10-CM

## 2023-06-19 DIAGNOSIS — N1831 Chronic kidney disease, stage 3a: Secondary | ICD-10-CM

## 2023-06-19 DIAGNOSIS — I700 Atherosclerosis of aorta: Secondary | ICD-10-CM

## 2023-06-19 DIAGNOSIS — I5032 Chronic diastolic (congestive) heart failure: Secondary | ICD-10-CM

## 2023-06-19 DIAGNOSIS — M170 Bilateral primary osteoarthritis of knee: Secondary | ICD-10-CM

## 2023-06-19 DIAGNOSIS — E1122 Type 2 diabetes mellitus with diabetic chronic kidney disease: Secondary | ICD-10-CM | POA: Diagnosis not present

## 2023-06-19 DIAGNOSIS — F411 Generalized anxiety disorder: Secondary | ICD-10-CM

## 2023-06-19 DIAGNOSIS — R6520 Severe sepsis without septic shock: Secondary | ICD-10-CM | POA: Diagnosis not present

## 2023-06-19 DIAGNOSIS — M160 Bilateral primary osteoarthritis of hip: Secondary | ICD-10-CM | POA: Diagnosis not present

## 2023-06-19 DIAGNOSIS — L89301 Pressure ulcer of unspecified buttock, stage 1: Secondary | ICD-10-CM

## 2023-06-19 DIAGNOSIS — Z7985 Long-term (current) use of injectable non-insulin antidiabetic drugs: Secondary | ICD-10-CM | POA: Diagnosis not present

## 2023-06-19 DIAGNOSIS — I081 Rheumatic disorders of both mitral and tricuspid valves: Secondary | ICD-10-CM

## 2023-06-19 DIAGNOSIS — E1151 Type 2 diabetes mellitus with diabetic peripheral angiopathy without gangrene: Secondary | ICD-10-CM

## 2023-06-19 DIAGNOSIS — A414 Sepsis due to anaerobes: Secondary | ICD-10-CM | POA: Diagnosis not present

## 2023-06-19 DIAGNOSIS — D631 Anemia in chronic kidney disease: Secondary | ICD-10-CM

## 2023-06-19 MED ORDER — HYDROCODONE-ACETAMINOPHEN 10-325 MG PO TABS: 1.0000 | ORAL_TABLET | Freq: Every day | ORAL | 0 refills | Status: AC | PRN

## 2023-06-19 MED ORDER — HYDROXYZINE PAMOATE 25 MG PO CAPS: 25.0000 mg | ORAL_CAPSULE | Freq: Three times a day (TID) | ORAL | 2 refills | Status: DC | PRN

## 2023-06-19 NOTE — Assessment & Plan Note (Signed)
 A1c is in good control. Continue glipizide  5 mg daily, metformin 1,000 mg bid and tirzepatide (Mounjaro) 5 mg weekly.

## 2023-06-19 NOTE — Assessment & Plan Note (Signed)
 She would be a candidate for knee joint replacement were she able to lose weight and have cardiac clearance. Otherwise, we will continue chronic opioid therapy for pain management.

## 2023-06-19 NOTE — Telephone Encounter (Signed)
 Form on providers desk for signature. Dm/cma

## 2023-06-19 NOTE — Assessment & Plan Note (Signed)
-  As above.

## 2023-06-19 NOTE — Assessment & Plan Note (Signed)
 Resolved

## 2023-06-19 NOTE — Assessment & Plan Note (Signed)
 Continue duloxetine  60 mg daily. We will try using hydroxyzine instead of alprazolam .

## 2023-06-19 NOTE — Progress Notes (Signed)
 [facility] [address] [place] [address] Dept: [phone] Dept Fax: [phone]  Virtual Video Visit  I connected with Jade Ellison on 06/19/23 at  3:40 PM EDT by a video enabled telemedicine application and verified that I am speaking with the correct person using two identifiers.  Location patient: Home Location provider: Clinic Persons participating in the virtual visit: Patient, her daughter Paul Boring), Provider  I discussed the limitations of evaluation and management by telemedicine and the availability of in person appointments. The patient expressed understanding and agreed to proceed.  Chief Complaint  Patient presents with   Hospitalization Follow-up    Hospital f/u and discuss home health.    SUBJECTIVE:  HPI: Jade Ellison is a 79 y.o. female who presents for follow-up form her recent hospitalization. Ms. Para was admitted to [facility] from 5/19-5/23/2025 due to acute pyelonephritis with severe sepsis, diverticulitis, and anemia. She notes she is feeling improvements since returning home. She has had a busy week with home health nursing, PT, and social workers coming for visits. She is eating and drinking well. IN the hospital, it was noted she had an area of a stage 1 sacral pressure injury. Her daughter notes she is propping her on pillows, rotating on either side, as [name] cannot lay on her side due to hip issues.  Patient Active Problem List   Diagnosis Date Noted   Pressure injury of skin 06/05/2023   Acute pyelonephritis 06/05/2023   Severe sepsis (HCC) 06/01/2023   Opioid-induced constipation 11/18/2022   Pruritus 10/08/2022   Primary osteoarthritis of both hips 07/02/2022   Acquired thrombophilia (HCC) 07/02/2022   History of stroke 07/02/2022   Recurrent umbilical hernia 07/02/2022   Acute ischemic stroke (HCC) 10/06/2021   Type 2 diabetes mellitus with stage 3a chronic kidney disease  (HCC) 10/06/2021   Paroxysmal atrial fibrillation (HCC) 10/06/2021   Polyp of transverse colon    Polyp of ascending colon    Recurrent UTI 01/16/2019   Short-term memory loss 10/06/2018   GAD (generalized anxiety disorder) 09/20/2017   Irritable bowel syndrome with both constipation and diarrhea 09/20/2017   Chronic kidney disease, stage 3a (HCC) 10/13/2016   Gastroesophageal reflux disease without esophagitis    Diabetic peripheral neuropathy associated with type 2 diabetes mellitus (HCC) 11/17/2014   Barrett's esophagus without dysplasia 06/26/2014   Dyslipidemia 05/24/2013   SVT (supraventricular tachycardia) (HCC) 09/19/2011   Aphagia 09/19/2011   Osteopenia of the elderly 07/29/2011   History of lower GI bleeding 04/10/2011   Normocytic anemia 04/10/2011   Class 3 severe obesity due to excess calories with serious comorbidity and body mass index (BMI) of 50.0 to 59.9 in adult 04/10/2011   Mixed incontinence 04/07/2007   Essential hypertension 03/25/2007   Diverticulosis of colon 03/25/2007   OSA (obstructive sleep apnea) 03/25/2007   History of kidney stones 03/25/2007   Hemorrhoids 03/25/2007   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/2007   Primary osteoarthritis of both knees 12/15/2006   CAD (coronary artery disease) 12/15/2006   Past Surgical History:  Procedure Laterality Date   BIOPSY  11/07/2019   Procedure: BIOPSY;  Surgeon: Sergio Dandy, MD;  Location: [facility] ENDOSCOPY;  Service: Endoscopy;;   CARDIAC CATHETERIZATION  01/13/2001   normal coronary arteries and nomal LV function by cath performed by Dr.Peter Nishan   CESAREAN SECTION  01/14/1984   CHOLECYSTECTOMY     COLONOSCOPY WITH PROPOFOL  N/A 11/07/2019   Procedure: COLONOSCOPY  WITH PROPOFOL ;  Surgeon: Sergio Dandy, MD;  Location: [facility] ENDOS[facility];  Service: Endoscopy;  Laterality: N/A;   ENTEROSCOPY  02/05/2012   Procedure: ENTEROSCOPY;  Surgeon: Pietro Bridegroom, MD;  Location: [facility]  ENDOSCOPY;  Service: Endoscopy;  Laterality: N/A;   ESOPHAGOGASTRODUODENOSCOPY N/A 04/19/2014   Procedure: ESOPHAGOGASTRODUODENOSCOPY (EGD);  Surgeon: Pietro Bridegroom, MD;  Location: [facility];  Service: Endoscopy;  Laterality: N/A;   ESOPHAGOGASTRODUODENOSCOPY (EGD) WITH PROPOFOL  N/A 08/01/2015   Procedure: ESOPHAGOGASTRODUODENOSCOPY (EGD) WITH PROPOFOL ;  Surgeon: Sergio Dandy, MD;  Location: [facility];  Service: Endoscopy;  Laterality: N/A;   ESOPHAGOGASTRODUODENOSCOPY (EGD) WITH PROPOFOL  N/A 11/07/2019   Procedure: ESOPHAGOGASTRODUODENOSCOPY (EGD) WITH PROPOFOL ;  Surgeon: Sergio Dandy, MD;  Location: [facility];  Service: Endoscopy;  Laterality: N/A;   EXTRACORPOREAL SHOCK WAVE LITHOTRIPSY Left    HERNIA REPAIR     HOT HEMOSTASIS  02/05/2012   Procedure: HOT HEMOSTASIS (ARGON PLASMA COAGULATION/BICAP);  Surgeon: Pietro Bridegroom, MD;  Location: [facility];  Service: Endoscopy;  Laterality: N/A;   KNEE ARTHROSCOPY Left    POLYPECTOMY  11/07/2019   Procedure: POLYPECTOMY;  Surgeon: Sergio Dandy, MD;  Location: [facility];  Service: Endoscopy;;   TUBAL LIGATION     UMBILICAL HERNIA REPAIR     Family History  Problem Relation Age of Onset   Heart disease Mother    Heart disease Father    Diabetes Sister    Stroke Brother    Hypertension Brother    Diabetes Brother    Cancer Brother        Renal   Cancer Brother        Prostate   Heart disease Brother    Heart disease Brother    Cancer Brother        Prostate, bladder   Diabetes Paternal Grandmother    Colon cancer Neg Hx    Social History   Tobacco Use   Smoking status: Never    Passive exposure: Never   Smokeless tobacco: Never  Vaping Use   Vaping status: Never Used  Substance Use Topics   Alcohol use: Yes    Alcohol/week: 1.0 standard drink of alcohol    Types: 1 Glasses of wine per week    Comment: wine sometimes on holidays and bedtime- rarely   Drug use: No    Current Outpatient  Medications:    acetaminophen  (TYLENOL ) 325 MG tablet, Take 650 mg by mouth every 6 (six) hours as needed for moderate pain or headache., Disp: , Rfl:    ALPRAZolam  (XANAX ) 0.25 MG tablet, Take 1 tablet (0.25 mg total) by mouth 2 (two) times daily as needed for anxiety., Disp: 30 tablet, Rfl: 0   apixaban  (ELIQUIS ) 5 MG TABS tablet, Take 1 tablet (5 mg total) by mouth 2 (two) times daily., Disp: 60 tablet, Rfl: 5   atorvastatin  (LIPITOR) 20 MG tablet, Take 1 tablet (20 mg total) by mouth daily., Disp: 90 tablet, Rfl: 3   desonide  (DESOWEN ) 0.05 % cream, Apply 1 Application topically 2 (two) times daily as needed (dry skin/irritation)., Disp: 30 g, Rfl: 1   dicyclomine  (BENTYL ) 20 MG tablet, TAKE 1 TABLET(20 MG) BY MOUTH THREE TIMES DAILY AFTER MEALS (Patient taking differently: Take 20 mg by mouth 2 (two) times daily.), Disp: 90 tablet, Rfl: 0   DULoxetine  (CYMBALTA ) 60 MG capsule, Take 1 capsule (60 mg total) by mouth daily., Disp: 90 capsule, Rfl: 3   furosemide  (LASIX ) 40 MG tablet, Take 1 tablet (40 mg total)  by mouth daily., Disp: 90 tablet, Rfl: 3   glipiZIDE  (GLUCOTROL  XL) 5 MG 24 hr tablet, Take 1 tablet (5 mg total) by mouth daily., Disp: 90 tablet, Rfl: 3   HYDROcodone -acetaminophen  (NORCO) 10-325 MG tablet, Take 1 tablet by mouth daily as needed for severe pain (pain score 7-10)., Disp: 12 tablet, Rfl: 0   latanoprost  (XALATAN ) 0.005 % ophthalmic solution, Place 1 drop into both eyes at bedtime. , Disp: , Rfl:    metFORMIN (GLUCOPHAGE) 1000 MG tablet, TAKE 1 TABLET BY MOUTH TWICE DAILY, Disp: 180 tablet, Rfl: 3   metoprolol  tartrate (LOPRESSOR ) 50 MG tablet, Take 1 tablet (50 mg total) by mouth 2 (two) times daily., Disp: 180 tablet, Rfl: 2   Mouthwashes (BIOTENE/CALCIUM  MT), Take 1 Dose by mouth daily as needed (dry mouth). , Disp: , Rfl:    Multiple Vitamin (MULTIVITAMIN WITH MINERALS) TABS tablet, Take 1 tablet by mouth daily., Disp: , Rfl:    omeprazole  (PRILOSEC) 40 MG capsule, TAKE 1  CAPSULE(40 MG) BY MOUTH DAILY, Disp: 90 capsule, Rfl: 3   pregabalin  (LYRICA ) 50 MG capsule, TAKE 1 CAPSULE(50 MG) BY MOUTH TWICE DAILY, Disp: 180 capsule, Rfl: 3   psyllium (METAMUCIL SMOOTH TEXTURE) 28 % packet, Take 1 packet by mouth daily as needed (constipation). , Disp: , Rfl:    tirzepatide (MOUNJARO) 5 MG/0.5ML Pen, Inject 5 mg into the skin once a week., Disp: 6 mL, Rfl: 3 Allergies  Allergen Reactions   Neurontin [Gabapentin] Other (See Comments)    Dizziness   Codeine Rash   Fosamax [Alendronate Sodium] Other (See Comments)    Upset stomach    Nsaids Other (See Comments)    Abdominal Pain   ROS: See pertinent positives and negatives per HPI.  OBSERVATIONS/OBJECTIVE:  VITALS per patient if applicable: Today's Vitals   06/19/23 1539  Height: 5\' 3"  (1.6 m)   Body mass index is 50.77 kg/m.    GENERAL: Alert and oriented. Appears well and in no acute distress. PSYCH/NEURO: Pleasant and cooperative. No obvious depression or anxiety. Speech and thought processing grossly intact.  Lab Results: Lab Results  Component Value Date   HGBA1C 6.5 (H) 06/03/2023   HGBA1C 8.1 (H) 02/13/2023   HGBA1C 7.1 (H) 10/08/2022   ASSESSMENT AND PLAN:  Problem List Items Addressed This Visit       Endocrine   Type 2 diabetes mellitus with stage 3a chronic kidney disease (HCC)   A1c is in good control. Continue glipizide  5 mg daily, metformin 1,000 mg bid and tirzepatide (Mounjaro) 5 mg weekly.        Musculoskeletal and Integument   Primary osteoarthritis of both hips   As above.      Relevant Medications   HYDROcodone -acetaminophen  (NORCO) 10-325 MG tablet   Other Relevant Orders   For home use only DME Other see comment   Primary osteoarthritis of both knees - Primary   She would be a candidate for knee joint replacement were she able to lose weight and have cardiac clearance. Otherwise, we will continue chronic opioid therapy for pain management.      Relevant  Medications   HYDROcodone -acetaminophen  (NORCO) 10-325 MG tablet   Other Relevant Orders   For home use only DME Other see comment     Genitourinary   Acute pyelonephritis   Resolved.        Other   GAD (generalized anxiety disorder)   Continue duloxetine  60 mg daily. We will try using hydroxyzine instead of alprazolam .  Relevant Medications   hydrOXYzine (VISTARIL) 25 MG capsule   Pressure injury of skin   I will order an eggcrate mattress to try and prevent further injuries.      Relevant Orders   For home use only DME Other see comment   Severe sepsis (HCC)   Resolved.        I discussed the assessment and treatment plan with the patient. The patient was provided an opportunity to ask questions and all were answered. The patient agreed with the plan and demonstrated an understanding of the instructions.   The patient was advised to call back or seek an in-person evaluation if the symptoms worsen or if the condition fails to improve as anticipated.  Return in about 3 months (around 09/19/2023) for Reassessment.   Graig Lawyer, MD

## 2023-06-19 NOTE — Assessment & Plan Note (Signed)
 I will order an eggcrate mattress to try and prevent further injuries.

## 2023-06-19 NOTE — Telephone Encounter (Signed)
 Patient dropped off document Home Health Certificate (Order ID [id number]), to be filled out by provider. Patient requested to send it back via Fax within 7-days. Document is located in providers tray at front office.Please advise at [phone]

## 2023-06-22 NOTE — Telephone Encounter (Signed)
 Form was faxed on 06/19/23. Dm/cma

## 2023-06-24 ENCOUNTER — Telehealth: Payer: Self-pay

## 2023-06-24 DIAGNOSIS — N10 Acute tubulo-interstitial nephritis: Secondary | ICD-10-CM

## 2023-06-24 DIAGNOSIS — E1122 Type 2 diabetes mellitus with diabetic chronic kidney disease: Secondary | ICD-10-CM

## 2023-06-24 NOTE — Telephone Encounter (Signed)
 Copied from CRM [id number]. Topic: Clinical - Request for Lab/Test Order >> Jun 23, 2023  4:58 PM Luane Rumps D wrote: Reason for CRM: April with [facility],  Patients daughter had spoken to April from [place] and stated that she was supposed to receive a fax for lab orders for blood/urine but she has not yet received it. Fax #: [phone] Phone #: [phone]

## 2023-06-24 NOTE — Telephone Encounter (Signed)
 Order printed and faxed to [facility] @ [phone].  Dm/cma

## 2023-06-24 NOTE — Telephone Encounter (Signed)
 Can you put in the orders you need done and I can call and get the fax number to send them to?  Thanks. Dm/cma

## 2023-06-25 ENCOUNTER — Other Ambulatory Visit (HOSPITAL_COMMUNITY): Payer: Self-pay

## 2023-06-25 ENCOUNTER — Telehealth: Payer: Self-pay

## 2023-06-25 NOTE — Telephone Encounter (Signed)
 Pharmacy Patient Advocate Encounter   Received notification from CoverMyMeds that prior authorization for hydrOXYzine  Pamoate 25MG  capsules  is required/requested.   Insurance verification completed.   The patient is insured through [employer] Medicare .   Per test claim: PA required; PA submitted to above mentioned insurance via CoverMyMeds Key/confirmation #/EOC [id number] Status is pending

## 2023-06-26 ENCOUNTER — Other Ambulatory Visit: Payer: Self-pay | Admitting: Family Medicine

## 2023-06-26 ENCOUNTER — Telehealth: Payer: Self-pay

## 2023-06-26 ENCOUNTER — Encounter: Payer: Self-pay | Admitting: Family Medicine

## 2023-06-26 DIAGNOSIS — N3000 Acute cystitis without hematuria: Secondary | ICD-10-CM

## 2023-06-26 MED ORDER — SULFAMETHOXAZOLE-TRIMETHOPRIM 800-160 MG PO TABS: 1.0000 | ORAL_TABLET | Freq: Two times a day (BID) | ORAL | 0 refills | Status: DC

## 2023-06-26 NOTE — Telephone Encounter (Signed)
 Copied from CRM [id number]. Topic: Clinical - Medication Question >> Jun 26, 2023  3:43 PM [place] wrote: Reason for CRM: Wanting to ask clinical question about medications  [phone] option 3  reference # [id number]

## 2023-06-26 NOTE — Telephone Encounter (Signed)
 Called number back and was asked if provider had discussed the the effects of Hydroxyzine  in older adults.  Advised that we had and they will fill the prescription now. Dm/cma

## 2023-06-26 NOTE — Telephone Encounter (Signed)
 Copied from CRM [id number]. Topic: Clinical - Medical Advice >> Jun 25, 2023  1:20 PM Trula Gable C wrote: Reason for CRM: [facility] need to verify if patient as a diagnosis of congested heart failure  Can leave a voicemail on this number   [phone]

## 2023-06-26 NOTE — Telephone Encounter (Signed)
Left VM to rtn call. Dm/cma       

## 2023-06-26 NOTE — Telephone Encounter (Signed)
Please review and advise. Thanks. Dm/cma  

## 2023-06-26 NOTE — Progress Notes (Signed)
 Cath UA obtained by home health shows leukocyte esterase 3+, protein 2+, nitrite -, occult blood 1+. Microscopic shows > 30 WBC/hpf and many bacteria. Culture is pending. I will treat empirically with Septra.  Graig Lawyer, MD

## 2023-06-26 NOTE — Telephone Encounter (Signed)
See other message.  Dm/cma  

## 2023-06-26 NOTE — Telephone Encounter (Signed)
 Copied from CRM [id number]. Topic: Clinical - Medication Prior Auth >> Jun 25, 2023  1:55 PM [place] A wrote: Reason for CRM: Amalia Badder from [facility] called to check status of PA- her contact information is [phone] option 3 Reference # [id number]

## 2023-06-26 NOTE — Telephone Encounter (Signed)
 Can you help with this?

## 2023-06-29 ENCOUNTER — Other Ambulatory Visit (HOSPITAL_COMMUNITY): Payer: Self-pay

## 2023-06-29 NOTE — Telephone Encounter (Signed)
 Spoke to physical Therapist, Josiah Nigh, and advised of providers message.  He stated that he got the message last week and passed the information on to the main office. Dm/cma

## 2023-06-29 NOTE — Telephone Encounter (Signed)
 Pharmacy Patient Advocate Encounter  Received notification from [employer]  that Prior Authorization for hydrOXYzine  Pamoate 25MG  capsules has been APPROVED from 06/28/2023 to 06/25/2024. Ran test claim, Copay is $0.00. This test claim was processed through [facility]- copay amounts may vary at other pharmacies due to pharmacy/plan contracts, or as the patient moves through the different stages of their insurance plan.   PA #/Case ID/Reference #:[id number]

## 2023-06-30 LAB — BASIC METABOLIC PANEL WITH GFR: EGFR (African American): 46

## 2023-07-01 ENCOUNTER — Telehealth: Payer: Self-pay

## 2023-07-01 ENCOUNTER — Other Ambulatory Visit: Payer: Medicare (Managed Care) | Admitting: Licensed Clinical Social Worker

## 2023-07-01 NOTE — Telephone Encounter (Signed)
 Patients, daughter, [name], notified VIA phone regarding labs done by home health.  Also advised that next appointment is due 09/19/23 and she will call back to schedule when she looks at her work schedule.  Dm/cma

## 2023-07-02 ENCOUNTER — Ambulatory Visit: Payer: Self-pay

## 2023-07-02 NOTE — Telephone Encounter (Signed)
 Copied from CRM [id number]. Topic: Clinical - Red Word Triage >> Jul 02, 2023 12:15 PM Dewanda Foots wrote: Red Word that prompted transfer to Nurse Triage: Jola Nash PT from [facility] wanted to speak to a nurse in regards to this patient not feeling well today.  Pt is having lightheadedness, low blood pressure, heartrate is irregular, but hx of AFIB, 2 days since bowel movement

## 2023-07-02 NOTE — Patient Outreach (Signed)
 Complex Care Management   Visit Note  07/02/2023  Name:  Jade Ellison MRN: [id number] DOB: 1945/12/20  Situation: Referral received for Complex Care Management related to SDOH Barriers:  Transportation I obtained verbal consent from Caregiver.  Visit completed with Esperanza Hedges  on the phone. LCSW A. Renaud Celli provided contact information to install wheelchair device on pt car with [employer].   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 pylori)    Hemorrhoids    Hernia of unspecified site of abdominal cavity without mention of obstruction or gangrene    History of blood transfusion    History of kidney stones    Hypertension 09 07 2013   TRANSTHORACIC ECHO STUDY CONCLUSIONS    Hypertension 09 07 2013   EJECTION FRACTION- 55%-60% .WALL MOTION WAS NORMAL   Impaired mobility    Iron  deficiency anemia    Morbid obesity (HCC)    Neuropathy    Obesity    Olecranon bursitis of left elbow    OSA (obstructive sleep apnea)    On cpap   Osteoarthritis    right hip   Osteoporosis    Primary osteoarthritis    Bilaterally (knee)   PSVT (paroxysmal supraventricular tachycardia) (HCC)    PSVT (paroxysmal supraventricular tachycardia) (HCC)    Short-term memory loss    Shortness of breath dyspnea    Sleep apnea    on CPAP- does not use machine   Spinal headache 1986   Type 2 diabetes mellitus (HCC)     Assessment: Patient Reported Symptoms:  Cognitive Cognitive Status: Unable to  Assess Cognitive/Intellectual Conditions Management [RPT]: Not Assessed      Neurological Neurological Review of Symptoms: Not assessed    HEENT HEENT Symptoms Reported: Not assessed      Cardiovascular Cardiovascular Symptoms Reported: Not assessed    Respiratory Respiratory Symptoms Reported: Not assesed    Endocrine Patient reports the following symptoms related to hypoglycemia or hyperglycemia : Not assessed Is patient diabetic?: Yes Is patient checking blood sugars at home?: Yes    Gastrointestinal Gastrointestinal Symptoms Reported: Not assessed      Genitourinary Genitourinary Symptoms Reported: No symptoms reported    Integumentary Integumentary Symptoms Reported: Not assessed    Musculoskeletal Musculoskelatal Symptoms Reviewed: Not assessed   Falls in the past year?: No Number of falls in past year: 1 or less Was there an injury with Fall?: No Fall Risk Category Calculator: 0 Patient Fall Risk Level: Low Fall Risk    Psychosocial Psychosocial Symptoms Reported: Not assessed     Quality of Family Relationships: helpful, stressful, supportive Do you feel physically threatened by others?: No      11/18/2022    1:21 PM  Depression screen PHQ 2/9  Decreased Interest 0  Down, Depressed, Hopeless 1  PHQ - 2 Score 1  There were no vitals filed for this visit.  Medications Reviewed Today     Reviewed by Fletcher Humble, LCSW (Social Worker) on 07/02/23 at 1551  Med List Status: <None>   Medication Order Taking? Sig Documenting Provider Last Dose Status Informant  acetaminophen  (TYLENOL ) 325 MG tablet [id number] No Take 650 mg by mouth every 6 (six) hours as needed for moderate pain or headache. [provider] Taking Active Self, Child, Pharmacy Records  ALPRAZolam  (XANAX ) 0.25 MG tablet [id number] No Take 1 tablet (0.25 mg total) by mouth 2 (two) times daily as needed for anxiety. Graig Lawyer, MD Taking Active Self, Child, Pharmacy Records   apixaban  (ELIQUIS ) 5 MG TABS tablet [id number] No Take 1 tablet (5 mg total) by mouth 2 (two) times daily. Graig Lawyer, MD Taking Active Self, Child, Pharmacy Records  atorvastatin  (LIPITOR) 20 MG tablet [id number] No Take 1 tablet (20 mg total) by mouth daily. Graig Lawyer, MD Taking Active Self, Child, Pharmacy Records  desonide  (DESOWEN ) 0.05 % cream [id number] No Apply 1 Application topically 2 (two) times daily as needed (dry skin/irritation). Graig Lawyer, MD Taking Active Self, Child, Pharmacy Records  dicyclomine  (BENTYL ) 20 MG tablet [id number] No TAKE 1 TABLET(20 MG) BY MOUTH THREE TIMES DAILY AFTER MEALS  Patient taking differently: Take 20 mg by mouth 2 (two) times daily.   Nandigam, Kavitha V, MD Taking Active Self, Child, Pharmacy Records  DULoxetine  (CYMBALTA ) 60 MG capsule [id number] No Take 1 capsule (60 mg total) by mouth daily. Graig Lawyer, MD Taking Active Self, Child, Pharmacy Records  furosemide  (LASIX ) 40 MG tablet [id number] No Take 1 tablet (40 mg total) by mouth daily. Jude Norton, NP Taking Active Self, Child, Pharmacy Records  glipiZIDE  (GLUCOTROL  XL) 5 MG 24 hr tablet [id number] No Take 1 tablet (5 mg total) by mouth daily. Graig Lawyer, MD Taking Active Self, Child, Pharmacy Records  HYDROcodone -acetaminophen  [facility]) 10-325 MG tablet [id number]  Take 1 tablet by mouth daily as needed for severe pain (pain score 7-10). Graig Lawyer, MD  Active   hydrOXYzine  (VISTARIL ) 25 MG capsule [id number]  Take 1 capsule (25 mg total) by mouth every 8 (eight) hours as needed. Graig Lawyer, MD  Active   latanoprost  (XALATAN ) 0.005 % ophthalmic solution [id number] No Place 1 drop into both eyes at bedtime.  [provider] Taking Active Self, Child, Pharmacy Records  metFORMIN (GLUCOPHAGE) 1000 MG tablet [id number] No TAKE 1 TABLET BY MOUTH TWICE DAILY Rudd, Malcolm Scrivener, MD Taking Active   metoprolol  tartrate (LOPRESSOR ) 50 MG tablet [id number] No Take 1 tablet (50 mg  total) by mouth 2 (two) times daily. Graig Lawyer, MD Taking Active Self, Child, Pharmacy Records  Mouthwashes (BIOTENE/CALCIUM  [place]) [id number] No Take 1 Dose by mouth daily as needed (dry mouth).  [provider] Taking Active Self, Child, Pharmacy Records  Multiple Vitamin (MULTIVITAMIN WITH MINERALS) TABS tablet [id number] No Take 1 tablet by mouth daily. [provider] Taking Active Self, Child, Pharmacy Records  omeprazole  (PRILOSEC) 40 MG capsule [id number] No TAKE 1 CAPSULE(40 MG) BY MOUTH DAILY Graig Lawyer, MD Taking Active Self, Child, Pharmacy Records  pregabalin  (LYRICA ) 50 MG capsule [id number] No TAKE 1 CAPSULE(50 MG) BY MOUTH TWICE DAILY Rudd, Malcolm Scrivener, MD Taking Active Self, Child, Pharmacy Records  psyllium (METAMUCIL SMOOTH TEXTURE) 28 % packet [id number] No Take 1 packet by mouth daily as needed (constipation).  [provider] Taking Active Self, Child, Pharmacy Records  Med Note Conny Del, DANA L   Mon Oct 07, 2021  8:46 AM)    sulfamethoxazole-trimethoprim (BACTRIM DS) 800-160 MG tablet [id number]  Take 1 tablet by mouth 2 (two) times daily. Graig Lawyer, MD  Active   tirzepatide  (MOUNJARO ) 5 MG/0.5ML Pen [id number] No Inject 5 mg into the skin once a week. Graig Lawyer, MD Taking Active Self, Child, Pharmacy Records           Med Note Verdia Glad, [place] R   Thu May 21, 2023  2:39 PM)              Recommendation:   Continue Current Plan of Care  Follow Up Plan:   Patient has met all care management goals. Care Management case will be closed. Patient has been provided contact information should new needs arise.   Fletcher Humble MSW, LCSW Licensed Clinical Social Worker  [facility], Population [facility] Direct Dial: [phone]  Fax: [phone]

## 2023-07-02 NOTE — Patient Instructions (Signed)

## 2023-07-02 NOTE — Telephone Encounter (Addendum)
 Please see triage note from RN Brian Campanile dated today addressing symptoms/concerns.

## 2023-07-02 NOTE — Telephone Encounter (Signed)
 FYI Only or Action Required?: FYI only for provider.  Patient was last seen in primary care on 06/19/2023 by Graig Lawyer, MD. Called Nurse Triage reporting Hypotension. Symptoms began today. Interventions attempted: Rest, hydration, or home remedies. Symptoms are: gradually improving.  Triage Disposition: No disposition on file. CAL notified  Patient/caregiver understands and will follow disposition?: no   Copied from CRM [id number]. Topic: Clinical - Red Word Triage >> Jul 02, 2023 12:15 PM Dewanda Foots wrote: Red Word that prompted transfer to Nurse Triage: Jola Nash PT from [facility] wanted to speak to a nurse in regards to this patient not feeling well today.  Pt is having lightheadedness, low blood pressure, heartrate is irregular, but hx of AFIB, 2 days since bowel movement Answer Assessment - Initial Assessment Questions 1. BLOOD PRESSURE: What is the blood pressure? Did you take at least two measurements 5 minutes apart?   Manual cuff:  90/60 at beginning of PT visit at 1200   Automatic wrist cuff 128/84 at 1225   Manual cuff 120/70 sitting up, pulse 70's and regular at 1230 2. ONSET: When did you take your blood pressure?     First reading was at 1200 3. HOW: How did you obtain the blood pressure? (e.g., visiting nurse, automatic home BP monitor)     Blood pressures taken by manual and automatic and regular cuff by home physical therapist 4. HISTORY: Do you have a history of low blood pressure? What is your blood pressure normally?     Patient states that her blood pressure is pretty low most of the time     Physical therapist reports that blood pressures run from 115-140's systolic and 70's to 80's diastolic 5. MEDICINES: Are you taking any medications for blood pressure? If Yes, ask: Have they been changed recently?     Patient takes multiple medication and was recently d/c'd from hospital for UTI.  Patient's daughter questions lasix  dosage. Dosage was recently  increased from 20 mg to 40mg  6. PULSE RATE: Do you know what your pulse rate is?      70's per physical therapist during visit 7. OTHER SYMPTOMS: Have you been sick recently? Have you had a recent injury?     Patient states that she had a funny feeling in her head kind of like she was going to pass out this morning when she woke up around 6 am.  Patient then took morning meds with no change in symptoms  Patient is bed bound. Symptoms increased when she raised the [facility]  Patient reports drinking 4-5 bottles of water per day, but patient uses purewick and Physical therapist  reports that there is no sediment in urine, but that it appears concentrated and on the darker side  Took mounjaro  today.  But has not eaten more than a few sips of glucerna at the time of call. Patient has not blood sugar monitoring equipment.   This nurse recommends transport to ED via EMS for evaluation Patient refuses.  Daughter lives with patient and is in home.  She is aware of symptoms and recommendation.  They plan to treat with fluids and meals and will call back or call 911 if any other symptoms arise.  Protocols used: Blood Pressure - Low-A-AH

## 2023-07-03 NOTE — Telephone Encounter (Signed)
 Called and spoke [name], patient's daughter, she is feeling better today with BP 128/70.  Advised of recommendations and if she starts feeling she will take her to the ER and if still having BP issues then will call and make an in-person appointment with Dr Therese Flash. Dm/cma

## 2023-07-03 NOTE — Telephone Encounter (Signed)
 Called number and the PA was for Hydroxyzine  and it has been approved. Dm/cma

## 2023-07-08 ENCOUNTER — Telehealth: Payer: Self-pay

## 2023-07-08 DIAGNOSIS — E1122 Type 2 diabetes mellitus with diabetic chronic kidney disease: Secondary | ICD-10-CM

## 2023-07-08 NOTE — Telephone Encounter (Signed)
 Copied from CRM [id number]. Topic: Clinical - Medication Question >> Jul 08, 2023  1:19 PM Chiquita SQUIBB wrote: Reason for CRM: [facility] is calling in regarding the patients glucose monitor, she is checking to see if this has been sent over yet, and an update on it. Please call [phone] with any questions.

## 2023-07-08 NOTE — Telephone Encounter (Signed)
Will call in the morning. Dm/cma

## 2023-07-09 MED ORDER — BLOOD GLUCOSE MONITORING SUPPL DEVI: 1.0000 | Freq: Three times a day (TID) | 0 refills | Status: AC

## 2023-07-09 MED ORDER — LANCET DEVICE MISC: 1.0000 | Freq: Three times a day (TID) | 0 refills | Status: AC

## 2023-07-09 MED ORDER — BLOOD GLUCOSE TEST VI STRP: 1.0000 | ORAL_STRIP | Freq: Every morning | 0 refills | Status: AC

## 2023-07-09 MED ORDER — LANCETS MISC. MISC: 1.0000 | Freq: Three times a day (TID) | 0 refills | Status: AC

## 2023-07-09 NOTE — Telephone Encounter (Signed)
 Patients BS meter has been broken and hasn't been checking sugars.  Can you please and thank you send an order for a meter/supplies to the pharmacy for her? Thanks. Dm/cma

## 2023-07-09 NOTE — Telephone Encounter (Signed)
Left VM to rtn call. Dm/cma       

## 2023-07-09 NOTE — Addendum Note (Signed)
 Addended by: THEDORA GARNETTE HERO on: 07/09/2023 02:07 PM   Modules accepted: Orders

## 2023-07-10 NOTE — Telephone Encounter (Signed)
 Noted. Dm/cma

## 2023-07-14 ENCOUNTER — Telehealth: Payer: Self-pay

## 2023-07-14 ENCOUNTER — Telehealth: Payer: Self-pay | Admitting: Family Medicine

## 2023-07-14 NOTE — Telephone Encounter (Signed)
 Copied from CRM [id number]. Topic: Clinical - Home Health Verbal Orders >> Jul 14, 2023  1:57 PM Deaijah H wrote: Caller/Agency: April w/ [facility] Callback Number: [phone] Service Requested: Wound Care treated w/ xerosorm Frequency: daily  Any new concerns about the patient? No

## 2023-07-14 NOTE — Telephone Encounter (Signed)
Will call in the morning. Dm/cma

## 2023-07-14 NOTE — Telephone Encounter (Signed)
 Copied from CRM [id number]. Topic: General - Other >> Jul 14, 2023  3:03 PM Gennette ORN wrote: Reason for CRM: April from [facility] is calling back for [place]. Her is April number  [phone] .

## 2023-07-14 NOTE — Telephone Encounter (Signed)
Left VM to rtn call. Dm/cma       

## 2023-07-15 NOTE — Telephone Encounter (Signed)
 Called and gave verbal approval for wound care. Dm/cma

## 2023-07-15 NOTE — Telephone Encounter (Signed)
 Patient has a sound on the lower abdomen/skin fold.  Is it okay for wound treatment up to 4 weeks? Please review and advise.   Thanks. Dm/cma

## 2023-07-24 ENCOUNTER — Telehealth: Payer: Self-pay

## 2023-07-24 NOTE — Telephone Encounter (Signed)
 April called back and gave her the verbal okay for the urine and Culture to be done.   Dm/cma

## 2023-07-24 NOTE — Telephone Encounter (Signed)
Please review and advise. Thanks. Dm/cma  

## 2023-07-24 NOTE — Telephone Encounter (Signed)
 Left VM to return call to give okay for urine. Dm/cma

## 2023-07-24 NOTE — Telephone Encounter (Signed)
 Copied from CRM [id number]. Topic: Clinical - Medical Advice >> Jul 24, 2023 11:31 AM Jade Ellison wrote: Reason for CRM: April, nurse with [facility], called in asking for a verbal order for a UA. Patient's daughter stated the the patient is having some burning with urination. April stated this would have to be an in/out cath. Callback number for April is [phone].

## 2023-07-28 ENCOUNTER — Other Ambulatory Visit: Payer: Self-pay | Admitting: Family Medicine

## 2023-07-28 ENCOUNTER — Telehealth: Payer: Self-pay

## 2023-07-28 DIAGNOSIS — N3000 Acute cystitis without hematuria: Secondary | ICD-10-CM

## 2023-07-28 MED ORDER — SULFAMETHOXAZOLE-TRIMETHOPRIM 800-160 MG PO TABS: 1.0000 | ORAL_TABLET | Freq: Two times a day (BID) | ORAL | 0 refills | Status: DC

## 2023-07-28 NOTE — Telephone Encounter (Signed)
 Left VM to rtn call regarding urine results. Dm/cma

## 2023-07-28 NOTE — Progress Notes (Signed)
 Received cath UA results.  Leukocyte esterase 3+ Protein 1+ Occult blood 1+ Nitrite + WBC > 30/hpf Bacteria Many  Urine culture pending. I will order empiric Septra  DS bid.  Garnette EMERSON Simpler, MD

## 2023-07-29 ENCOUNTER — Telehealth: Payer: Self-pay

## 2023-07-29 DIAGNOSIS — M170 Bilateral primary osteoarthritis of knee: Secondary | ICD-10-CM

## 2023-07-29 DIAGNOSIS — N3000 Acute cystitis without hematuria: Secondary | ICD-10-CM

## 2023-07-29 DIAGNOSIS — E1122 Type 2 diabetes mellitus with diabetic chronic kidney disease: Secondary | ICD-10-CM

## 2023-07-29 DIAGNOSIS — L89301 Pressure ulcer of unspecified buttock, stage 1: Secondary | ICD-10-CM

## 2023-07-29 DIAGNOSIS — I2510 Atherosclerotic heart disease of native coronary artery without angina pectoris: Secondary | ICD-10-CM

## 2023-07-29 DIAGNOSIS — M160 Bilateral primary osteoarthritis of hip: Secondary | ICD-10-CM

## 2023-07-29 NOTE — Telephone Encounter (Signed)
 Copied from CRM [id number]. Topic: General - Other >> Jul 29, 2023 11:44 AM Berneda FALCON wrote: Reason for CRM: Signe Eastern (PT) from [facility] would like the Dr to know that he was supposed to see the patient today but the patient cancelled due to diarrhea multiple rounds. Follow up with the patient's daughter, Charleen [phone]  Contact number for Signe is [phone]

## 2023-07-29 NOTE — Addendum Note (Signed)
 Addended by: THEDORA GARNETTE HERO on: 07/29/2023 01:42 PM   Modules accepted: Orders

## 2023-07-29 NOTE — Telephone Encounter (Signed)
 Called and spoke to patient's daughter and she then gave the phone to her mother.  I advised that RX was sent to pharmacy for the urine infection and the culture was still pending.   Patient advised me that she is basically be ridden .  Dm/cma

## 2023-08-03 ENCOUNTER — Telehealth: Payer: Self-pay

## 2023-08-03 ENCOUNTER — Other Ambulatory Visit: Payer: Self-pay

## 2023-08-03 DIAGNOSIS — E1122 Type 2 diabetes mellitus with diabetic chronic kidney disease: Secondary | ICD-10-CM

## 2023-08-03 NOTE — Progress Notes (Signed)
 Complex Care Management Note  Care Guide Note 08/03/2023 Name: Jade Ellison MRN: [id number] DOB: [birth year]  Jade Ellison is a 78 y.o. year old female who sees Rudd, Garnette HERO, MD for primary care. I reached out to Jade Ellison by phone today to offer complex care management services.  [name] was given information about Complex Care Management services today including:   The Complex Care Management services include support from the care team which includes your Nurse Care Manager, Clinical Social Worker, or Pharmacist.  The Complex Care Management team is here to help remove barriers to the health concerns and goals most important to you. Complex Care Management services are voluntary, and the patient may decline or stop services at any time by request to their care team member.   Complex Care Management Consent Status: Patient agreed to services and verbal consent obtained.   Follow up plan:  Telephone appointment with complex care management team member scheduled for:  08/03/23 at 2:30 p.m.   Encounter Outcome:  Patient Scheduled  [name]  [facility] Health Care Management Assistant Direct Dial: [phone]  Fax: [phone]

## 2023-08-03 NOTE — Patient Outreach (Signed)
 Complex Care Management   Visit Note  08/04/2023  Name:  Jade Ellison MRN: [id number] DOB: 08-04-1945  Situation: Referral received for Complex Care Management related to Diabetes with Complications I obtained verbal consent from Patient.  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 pylori)    Hemorrhoids    Hernia of unspecified site of abdominal cavity without mention of obstruction or gangrene    History of blood transfusion    History of kidney stones    Hypertension 09 07 2013   TRANSTHORACIC ECHO STUDY CONCLUSIONS    Hypertension 09 07 2013   EJECTION FRACTION- 55%-60% .WALL MOTION WAS NORMAL   Impaired mobility    Iron  deficiency anemia    Morbid obesity (HCC)    Neuropathy    Obesity    Olecranon bursitis of left elbow    OSA (obstructive sleep apnea)    On cpap   Osteoarthritis    right hip   Osteoporosis    Primary osteoarthritis    Bilaterally (knee)   PSVT (paroxysmal supraventricular tachycardia) (HCC)    PSVT (paroxysmal supraventricular tachycardia) (HCC)    Short-term memory loss    Shortness of breath dyspnea    Sleep apnea    on CPAP- does not use machine   Spinal headache 1986   Type 2 diabetes mellitus (HCC)     Assessment: Patient's daughter request personal care resources and states she will be self pay for patient. She reports patient has a motorized wheelchair, but needs some accessories to use  motorized chair with her vehicle. Emmi education regarding Diabetes sent via email.   Patient Reported Symptoms:  Cognitive Cognitive Status: Alert and oriented to person, place, and time, Insightful and able to interpret abstract concepts, Normal speech and language skills      Neurological Neurological Review of Symptoms: Other: [place] Neurological Symptoms/Conditions [RPT]: patient reports a history of 2 strokes and states she sometimes does not remember well. Neurological Management Strategies: Coping strategies, Medication therapy, Routine screening Neurological Self-Management Outcome: 4 (good)  HEENT HEENT Symptoms Reported: No symptoms reported      Cardiovascular Cardiovascular Symptoms Reported: No symptoms reported    Respiratory Respiratory Symptoms Reported: No symptoms reported    Endocrine Endocrine Symptoms Reported: No symptoms reported Is patient diabetic?: Yes Is patient checking blood sugars at home?:  (daughter reports patient's BS is not routinely checked. She reports blood sugar last checked yesterday and it was 167 after eating on yesterday. Daughter states she will check patient BS more often.) Endocrine Self-Management Outcome: 3 (uncertain)  Gastrointestinal Gastrointestinal Symptoms Reported: No symptoms reported      Genitourinary Genitourinary Symptoms Reported: Incontinence Additional Genitourinary Details: uses pur wik, signs/symptoms burning, odor, cloudy    Integumentary Integumentary Symptoms Reported: Other Additional Integumentary  Details: daughter reports patient has a couple blisters top of buttocks. she reports area under paitent's stomach fold treated with medicated spray. and states patient has an left flank area above left hip that is healing. discussed keeping skin clean/dry. changing positions regularly.    Musculoskeletal Musculoskelatal Symptoms Reviewed: Limited mobility, Difficulty walking Additional Musculoskeletal Details: patient  reports limited mobility due to osteoarthritis of both knees and hips. patient has a motorized chair. duaghter reports patient has a lift, but states it is becoming harder to move patient. Currently patient is active with [place]  home health. Musculoskeletal Management Strategies: Medication therapy, Routine screening, Exercise, Medical device, Adequate rest Musculoskeletal Self-Management Outcome: 3 (uncertain) Falls in the past year?: No Patient at Risk for Falls Due to: Impaired balance/gait, Impaired mobility Fall risk Follow up: Falls prevention discussed, Education provided, Falls evaluation completed  Psychosocial Psychosocial Symptoms Reported: Sadness - if selected complete PHQ 2-9 Additional Psychological Details: daughter expresses caregiver stress. she is primary caregiver for patient in addition is taking care of 3 children. patient expresses some sadness re: her health status. both patient and daughter are agreeable to LCSW referral Behavioral Management Strategies: Adequate rest, Medication therapy Behavioral Health Self-Management Outcome: 3 (uncertain)          08/03/2023    3:27 PM  Depression screen PHQ 2/9  Decreased Interest 0  Down, Depressed, Hopeless 1  PHQ - 2 Score 1    There were no vitals filed for this visit.  Medications Reviewed Today     Reviewed by Sayvion Vigen M, RN (Registered Nurse) on 08/03/23 at 1454  Med List Status: <None>   Medication Order Taking? Sig Documenting Provider Last Dose Status Informant  acetaminophen  (TYLENOL ) 325 MG tablet [id number] Yes Take 650 mg by mouth every 6 (six) hours as needed for moderate pain or headache. [provider]  Active Self, Child, Pharmacy Records  ALPRAZolam  (XANAX ) 0.25 MG tablet [id number] Yes Take 1 tablet (0.25 mg total) by mouth 2 (two) times daily as needed for anxiety. Thedora Garnette HERO, MD  Active Self, Child, Pharmacy Records  apixaban  (ELIQUIS ) 5 MG TABS tablet [id number] Yes Take 1 tablet  (5 mg total) by mouth 2 (two) times daily. Thedora Garnette HERO, MD  Active Self, Child, Pharmacy Records  atorvastatin  (LIPITOR) 20 MG tablet [id number] Yes Take 1 tablet (20 mg total) by mouth daily. Thedora Garnette HERO, MD  Active Self, Child, Pharmacy Records  Blood Glucose Monitoring Suppl DEVI [id number]  1 each by Does not apply route in the morning, at noon, and at bedtime. May substitute to any manufacturer covered by patient's insurance. Thedora Garnette HERO, MD  Active   desonide  (DESOWEN ) 0.05 % cream [id number]  Apply 1 Application topically 2 (two) times daily as needed (dry skin/irritation). Thedora Garnette HERO, MD  Active Self, Child, Pharmacy Records  dicyclomine  (BENTYL ) 20 MG tablet [id number] Yes TAKE 1 TABLET(20 MG) BY MOUTH THREE TIMES DAILY AFTER MEALS Nandigam, Kavitha V, MD  Active Self, Child, Pharmacy Records  DULoxetine  (CYMBALTA ) 60 MG capsule [id number] Yes Take 1 capsule (60 mg total) by mouth daily. Thedora Garnette HERO, MD  Active Self, Child, Pharmacy Records  furosemide  (LASIX ) 40 MG tablet [id number] Yes Take 1 tablet (40 mg total) by mouth daily. Daneen Damien BROCKS, NP  Active Self, Child, Pharmacy Records  glipiZIDE  (GLUCOTROL  XL) 5 MG 24 hr tablet [id number] Yes Take 1 tablet (5 mg total) by mouth daily. Thedora Garnette HERO, MD  Active Self, Child, Pharmacy Records  Glucose Blood (BLOOD GLUCOSE TEST STRIPS) STRP [id number]  1 each by In Vitro route every morning. May substitute to any manufacturer covered by patient's insurance. Thedora Garnette HERO, MD  Active   HYDROcodone -acetaminophen  [facility]) 10-325 MG tablet [id number] Yes Take 1 tablet by mouth daily as needed for severe pain (pain score 7-10). Thedora Garnette HERO, MD  Active   hydrOXYzine  (VISTARIL ) 25 MG capsule [id number] Yes Take 1 capsule (25 mg total) by mouth every 8 (eight) hours as needed. Thedora Garnette HERO, MD  Active   Lancet Device MISC [id number]  1 each by Does not apply route in the morning, at noon, and at bedtime. May substitute to any  manufacturer covered by patient's insurance. Thedora Garnette HERO, MD  Active   Lancets Misc. MISC [id number]  1 each by Does not apply route in the morning, at noon, and at bedtime. May substitute to any manufacturer covered by patient's insurance. Thedora Garnette HERO, MD  Active   latanoprost  (XALATAN ) 0.005 % ophthalmic solution [id number] Yes Place 1 drop into both eyes at bedtime.  [provider]  Active Self, Child, Pharmacy Records  metFORMIN (GLUCOPHAGE) 1000 MG tablet [id number] Yes TAKE 1 TABLET BY MOUTH TWICE DAILY Thedora Garnette HERO, MD  Active   metoprolol  tartrate (LOPRESSOR ) 50 MG tablet [id number] Yes Take 1 tablet (50 mg total) by mouth 2 (two) times daily. Thedora Garnette HERO, MD  Active Self, Child, Pharmacy Records  Mouthwashes (BIOTENE/CALCIUM  [place]) [id number] Yes Take 1 Dose by mouth daily as needed (dry mouth).  [provider]  Active Self, Child, Pharmacy Records  Multiple Vitamin (MULTIVITAMIN WITH MINERALS) TABS tablet [id number] Yes Take 1 tablet by mouth daily. [provider]  Active Self, Child, Pharmacy Records  omeprazole  (PRILOSEC) 40 MG capsule [id number] Yes TAKE 1 CAPSULE(40 MG) BY MOUTH DAILY Thedora Garnette HERO, MD  Active Self, Child, Pharmacy Records  pregabalin  (LYRICA ) 50 MG capsule [id number] Yes TAKE 1 CAPSULE(50 MG) BY MOUTH TWICE DAILY Rudd, Garnette HERO, MD  Active Self, Child, Pharmacy Records  psyllium (METAMUCIL SMOOTH TEXTURE) 28 % packet [id number] Yes Take 1 packet by mouth daily as needed (constipation).  [provider]  Active Self, Child, Pharmacy Records           Med Note TORRIE, LONELL CROME   Mon Oct 07, 2021  8:46 AM)    sulfamethoxazole -trimethoprim  (BACTRIM  DS) 800-160 MG tablet [id number]  Take 1 tablet by mouth 2 (two) times daily.  Patient not taking: Reported on 08/03/2023   Thedora Garnette HERO, MD  Active   sulfamethoxazole -trimethoprim  (BACTRIM  DS) 800-160 MG tablet [id number]  Take 1 tablet by mouth 2 (two) times daily.  Patient not  taking: Reported on 08/03/2023   Thedora Garnette HERO, MD  Active   tirzepatide  (MOUNJARO ) 5 MG/0.5ML Pen [id number] Yes Inject 5 mg into the skin once a week. Thedora Garnette HERO, MD  Active Self, Child, Pharmacy Records           Med Note [place], [place] R   Thu May 21, 2023  2:39 PM)            Recommendation:   Referral to: LCSW for caregiver stress, and patient counseling re: patient expressed sadness regarding status of her health condition/coping strategies  Follow Up Plan:   Telephone follow up appointment date/time:  08/17/23  Heddy Shutter, RN, MSN, BSN, CCM [place]  [facility], Population Health Case Manager Phone: [phone]

## 2023-08-04 ENCOUNTER — Telehealth: Payer: Self-pay

## 2023-08-04 DIAGNOSIS — N3000 Acute cystitis without hematuria: Secondary | ICD-10-CM

## 2023-08-04 MED ORDER — FLUCONAZOLE 150 MG PO TABS
150.0000 mg | ORAL_TABLET | Freq: Once | ORAL | 0 refills | Status: AC
Start: 2023-08-04 — End: 2023-08-04

## 2023-08-04 NOTE — Telephone Encounter (Signed)
 Pt scheduled for office visit. Her daughter wants to see if she could get a rx for Diflucan  sent to [facility] on [address] [facility] due to yeast caused by the antibiotic

## 2023-08-04 NOTE — Addendum Note (Signed)
 Addended by: THEDORA GARNETTE HERO on: 08/04/2023 05:21 PM   Modules accepted: Orders

## 2023-08-04 NOTE — Patient Instructions (Signed)
 Visit Information  Thank you for taking time to visit with me today. Please don't hesitate to contact me if I can be of assistance to you before our next scheduled appointment.  Our next appointment is by telephone on 08/17/23 at 12:00 pm Please call the care guide team at [phone] if you need to cancel or reschedule your appointment.   Following is a copy of your care plan:   Goals Addressed             This Visit's Progress    VBCI RN Care Plan       Problems:  Care Coordination needs related to personal care service needs Chronic Disease Management support and education needs related to DMII  Goal: Over the next 90 days the Patient will attend all scheduled medical appointments: with providers as evidenced by patient/caregiver report and/or review of chart        continue to work with RN Care Manager and/or Social Worker to address care management and care coordination needs related to DMII as evidenced by adherence to care management team scheduled appointments     take all medications exactly as prescribed and will call provider for medication related questions as evidenced by patient/caregiver report and/or review of chart    verbalize understanding of plan for management of DMII as evidenced by patient/caregiver report and/or review of chart  Interventions:  Diabetes Interventions: Assessed patient's understanding of A1c goal: <8% Provided education to patient about basic DM disease process Reviewed medications with patient and discussed importance of medication adherence Discussed plans with patient for ongoing care management follow up and provided patient with direct contact information for care management team Referral made to social work team for assistance with caregiver stress resources, coping strategies related to sadness related to her condition. Screening for signs and symptoms of depression related to chronic disease state  Assessed social determinant of health  barriers Lab Results  Component Value Date   HGBA1C 6.5 (H) 06/03/2023    Patient Self-Care Activities:  Attend all scheduled provider appointments Call provider office for new concerns or questions  Take medications as prescribed   Work with the social worker to address care coordination needs and will continue to work with the clinical team to address health care and disease management related needs Review emmi education heart-healthy diet, diabetes: nutrition and healthy eating, diabetes type 2  Plan:  Telephone follow up appointment with care management team member scheduled for:  08/17/23           Please call the Suicide and Crisis Lifeline: 988 call the [place]  National Suicide Prevention Lifeline: 226-706-3823 or TTY: 628 793 0128 TTY 548-611-0813) to talk to a trained counselor call 1-800-273-TALK (toll free, 24 hour hotline) if you are experiencing a Mental Health or Behavioral Health Crisis or need someone to talk to.  Patient verbalizes understanding of instructions and care plan provided today and agrees to view in MyChart. Active MyChart status and patient understanding of how to access instructions and care plan via MyChart confirmed with patient.     Heddy Shutter, RN, MSN, BSN, CCM   [facility], Population Health Case Manager Phone: [phone]

## 2023-08-04 NOTE — Telephone Encounter (Signed)
 Copied from CRM [id number]. Topic: Clinical - Medication Question >> Aug 04, 2023  2:50 PM Franky GRADE wrote: Reason for CRM: Patient is taking hydrOXYzine  (VISTARIL ) 25 MG capsule [[id number]] to help her sleep; however, it is not working and she would like to know if there is anything else Dr.Rudd could prescribe. Best call back number is [phone]

## 2023-08-05 NOTE — Telephone Encounter (Unsigned)
 Copied from CRM [id number]. Topic: General - Other >> Aug 05, 2023 12:43 PM Henretta I wrote: Reason for CRM:Jim hoffman from home health was calling to notify provider that patient is being discharged from home health services as she has reached maximum at this time and still remains bed bound Last week she had diarrhea took imodium  and resolved this week she has had 3 days of diarrhea took imodium and didn't resolve thinks it might be related to fruit intake  No fever nausea or vomiting, no strong smell with diarrhea  Any further questions he can be reached at [phone]  any recommendations call kelly patients daughter

## 2023-08-08 ENCOUNTER — Other Ambulatory Visit: Payer: Self-pay | Admitting: Family Medicine

## 2023-08-08 DIAGNOSIS — I480 Paroxysmal atrial fibrillation: Secondary | ICD-10-CM

## 2023-08-17 ENCOUNTER — Telehealth: Payer: Medicare (Managed Care)

## 2023-08-17 NOTE — Patient Instructions (Signed)
 Jade Ellison - I am sorry I was unable to reach you today for our scheduled appointment. I work with Thedora, Garnette HERO, MD and am calling to support your healthcare needs. Please contact me at [phone] at your earliest convenience. I look forward to speaking with you soon.   Thank you,  Heddy Shutter, RN, MSN, BSN, CCM [place]  [facility], Population Health Case Manager Phone: [phone]

## 2023-08-18 ENCOUNTER — Telehealth: Payer: Medicare (Managed Care) | Admitting: Family Medicine

## 2023-08-18 ENCOUNTER — Other Ambulatory Visit (HOSPITAL_COMMUNITY): Payer: Self-pay

## 2023-08-18 ENCOUNTER — Telehealth: Payer: Self-pay

## 2023-08-18 VITALS — Ht 63.0 in | Wt 280.0 lb

## 2023-08-18 DIAGNOSIS — R300 Dysuria: Secondary | ICD-10-CM | POA: Diagnosis not present

## 2023-08-18 DIAGNOSIS — F5101 Primary insomnia: Secondary | ICD-10-CM | POA: Diagnosis not present

## 2023-08-18 DIAGNOSIS — K582 Mixed irritable bowel syndrome: Secondary | ICD-10-CM

## 2023-08-18 MED ORDER — TRAZODONE HCL 50 MG PO TABS: 25.0000 mg | ORAL_TABLET | Freq: Every evening | ORAL | 3 refills | Status: AC | PRN

## 2023-08-18 MED ORDER — DICYCLOMINE HCL 20 MG PO TABS: ORAL_TABLET | ORAL | 0 refills | Status: DC

## 2023-08-18 MED ORDER — ESTRADIOL 0.1 MG/GM VA CREA: TOPICAL_CREAM | VAGINAL | 12 refills | Status: AC

## 2023-08-18 NOTE — Assessment & Plan Note (Signed)
 We will give a trial of trazodone  25 mg at bedtime.

## 2023-08-18 NOTE — Progress Notes (Signed)
 [facility] [address] [place] [address] Dept: [phone] Dept Fax: [phone]  Virtual Video Visit  I connected with Jade Ellison on 08/18/23 at  1:20 PM EDT by a video enabled telemedicine application and verified that I am speaking with the correct person using two identifiers.  Location patient: Home Location provider: Clinic Persons participating in the virtual visit: Patient, daughter Jade Ellison), and Provider  I discussed the limitations of evaluation and management by telemedicine and the availability of in person appointments. The patient expressed understanding and agreed to proceed.  Chief Complaint  Patient presents with   Follow-up    F/u to discuss meds. Not sleeping well and having side effects on the Hydroxyzine    SUBJECTIVE:  HPI: Jade Ellison is a 77 y.o. female who presents with a complaint of ineffectiveness of her hydroxyzine  that she takes at bedtime. She notes that This does not always help with her sleep, but also it can cause daytime drowsiness for her. She is not up at nighttime, so is not at risk for falls at night.  Ms. Voorheis has a history of IBS with mixed diarrhea and constipation. She manages her symptoms with dicyclomine  20 mg bid. She feels this does help./ She has some occasional diarrhea, but she relates this to her tirzepatide  use.  Ms. Urbanski has a history of prior urosepsis. She and her daughter both worry about her developing a recurrent UTI. Ms. Crigler does have dysuria fairly frequently. She also has a history of urinary incontinence. She uses a Pure Wick system at night. Her daughter notes that at times, the suction of the device will cause it to adhere to the skin. She does get some discomfort when this is pulled away. She is not having vaginal discharge. She was previously prescribed vaginal estrogen for GUSM, but has not been using this.  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) 10/06/2021   Paroxysmal atrial fibrillation (HCC) 10/06/2021   Polyp of transverse colon    Polyp of ascending colon    Recurrent UTI 01/16/2019   Short-term memory loss 10/06/2018   GAD (generalized anxiety disorder) 09/20/2017   Irritable bowel syndrome with both constipation and diarrhea 09/20/2017   Chronic kidney disease, stage 3a (HCC) 10/13/2016   Gastroesophageal reflux disease without esophagitis    Diabetic peripheral neuropathy associated with type 2 diabetes mellitus (HCC) 11/17/2014   Barrett's esophagus without dysplasia 06/26/2014   Dyslipidemia 05/24/2013   SVT (supraventricular tachycardia) (HCC) 09/19/2011   Aphagia 09/19/2011   Osteopenia of the elderly 07/29/2011   History of lower GI bleeding 04/10/2011   Normocytic anemia 04/10/2011   Class 3 severe obesity due to excess calories with serious comorbidity and body mass index (BMI) of 50.0 to 59.9 in adult 04/10/2011   Mixed incontinence 04/07/2007   Essential hypertension 03/25/2007   Diverticulosis of colon 03/25/2007   OSA (obstructive sleep apnea) 03/25/2007   History of kidney stones 03/25/2007   Hemorrhoids 03/25/2007   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/2007  Primary osteoarthritis of both knees 12/15/2006   CAD (coronary artery disease) 12/15/2006   Past Surgical History:  Procedure Laterality Date   BIOPSY  11/07/2019   Procedure: BIOPSY;  Surgeon: Shila Gustav GAILS, MD;  Location: [facility];  Service:  Endoscopy;;   CARDIAC CATHETERIZATION  01/13/2001   normal coronary arteries and nomal LV function by cath performed by Dr.Peter Nishan   CESAREAN SECTION  01/14/1984   CHOLECYSTECTOMY     COLONOSCOPY WITH PROPOFOL  N/A 11/07/2019   Procedure: COLONOSCOPY WITH PROPOFOL ;  Surgeon: Shila Gustav GAILS, MD;  Location: [facility];  Service: Endoscopy;  Laterality: N/A;   ENTEROSCOPY  02/05/2012   Procedure: ENTEROSCOPY;  Surgeon: Princella CHRISTELLA Nida, MD;  Location: [facility];  Service: Endoscopy;  Laterality: N/A;   ESOPHAGOGASTRODUODENOSCOPY N/A 04/19/2014   Procedure: ESOPHAGOGASTRODUODENOSCOPY (EGD);  Surgeon: Princella CHRISTELLA Nida, MD;  Location: [facility];  Service: Endoscopy;  Laterality: N/A;   ESOPHAGOGASTRODUODENOSCOPY (EGD) WITH PROPOFOL  N/A 08/01/2015   Procedure: ESOPHAGOGASTRODUODENOSCOPY (EGD) WITH PROPOFOL ;  Surgeon: Gustav GAILS Shila, MD;  Location: [facility];  Service: Endoscopy;  Laterality: N/A;   ESOPHAGOGASTRODUODENOSCOPY (EGD) WITH PROPOFOL  N/A 11/07/2019   Procedure: ESOPHAGOGASTRODUODENOSCOPY (EGD) WITH PROPOFOL ;  Surgeon: Shila Gustav GAILS, MD;  Location: [facility];  Service: Endoscopy;  Laterality: N/A;   EXTRACORPOREAL SHOCK WAVE LITHOTRIPSY Left    HERNIA REPAIR     HOT HEMOSTASIS  02/05/2012   Procedure: HOT HEMOSTASIS (ARGON PLASMA COAGULATION/BICAP);  Surgeon: Princella CHRISTELLA Nida, MD;  Location: [facility];  Service: Endoscopy;  Laterality: N/A;   KNEE ARTHROSCOPY Left    POLYPECTOMY  11/07/2019   Procedure: POLYPECTOMY;  Surgeon: Shila Gustav GAILS, MD;  Location: [facility];  Service: Endoscopy;;   TUBAL LIGATION     UMBILICAL HERNIA REPAIR     Family History  Problem Relation Age of Onset   Heart disease Mother    Heart disease Father    Diabetes Sister    Stroke Brother    Hypertension Brother    Diabetes Brother    Cancer Brother        Renal   Cancer Brother        Prostate   Heart disease Brother    Heart disease Brother    Cancer Brother         Prostate, bladder   Diabetes Paternal Grandmother    Colon cancer Neg Hx    Social History   Tobacco Use   Smoking status: Never    Passive exposure: Never   Smokeless tobacco: Never  Vaping Use   Vaping status: Never Used  Substance Use Topics   Alcohol use: Yes    Alcohol/week: 1.0 standard drink of alcohol    Types: 1 Glasses of wine per week    Comment: wine sometimes on holidays and bedtime- rarely   Drug use: No    Current Outpatient Medications:    acetaminophen  (TYLENOL ) 325 MG tablet, Take 650 mg by mouth every 6 (six) hours as needed for moderate pain or headache., Disp: , Rfl:    apixaban  (ELIQUIS ) 5 MG TABS tablet, Take 1 tablet (5 mg total) by mouth 2 (two) times daily., Disp: 60 tablet, Rfl: 5   atorvastatin  (LIPITOR) 20 MG tablet, Take 1 tablet (20 mg total) by mouth daily., Disp: 90 tablet, Rfl: 3   Blood Glucose Monitoring Suppl DEVI, 1 each by Does not apply route in the morning, at noon, and at bedtime. May substitute to any manufacturer covered by patient's insurance., Disp: 1 each,  Rfl: 0   desonide  (DESOWEN ) 0.05 % cream, Apply 1 Application topically 2 (two) times daily as needed (dry skin/irritation)., Disp: 30 g, Rfl: 1   DULoxetine  (CYMBALTA ) 60 MG capsule, Take 1 capsule (60 mg total) by mouth daily., Disp: 90 capsule, Rfl: 3   estradiol  (ESTRACE ) 0.1 MG/GM vaginal cream, Place 1 Applicatorful vaginally at bedtime for 7 days, THEN 1 Applicatorful 2 (two) times a week., Disp: 42.5 g, Rfl: 12   furosemide  (LASIX ) 40 MG tablet, Take 1 tablet (40 mg total) by mouth daily., Disp: 90 tablet, Rfl: 3   glipiZIDE  (GLUCOTROL  XL) 5 MG 24 hr tablet, Take 1 tablet (5 mg total) by mouth daily., Disp: 90 tablet, Rfl: 3   Glucose Blood (BLOOD GLUCOSE TEST STRIPS) STRP, 1 each by In Vitro route every morning. May substitute to any manufacturer covered by patient's insurance., Disp: 100 strip, Rfl: 0   HYDROcodone -acetaminophen  (NORCO) 10-325 MG tablet, Take 1 tablet by  mouth daily as needed for severe pain (pain score 7-10)., Disp: 12 tablet, Rfl: 0   Lancets (FREESTYLE) lancets, daily., Disp: , Rfl:    latanoprost  (XALATAN ) 0.005 % ophthalmic solution, Place 1 drop into both eyes at bedtime. , Disp: , Rfl:    metFORMIN (GLUCOPHAGE) 1000 MG tablet, TAKE 1 TABLET BY MOUTH TWICE DAILY, Disp: 180 tablet, Rfl: 3   metoprolol  tartrate (LOPRESSOR ) 50 MG tablet, TAKE 1 TABLET(50 MG) BY MOUTH TWICE DAILY, Disp: 180 tablet, Rfl: 1   Mouthwashes (BIOTENE/CALCIUM  MT), Take 1 Dose by mouth daily as needed (dry mouth). , Disp: , Rfl:    Multiple Vitamin (MULTIVITAMIN WITH MINERALS) TABS tablet, Take 1 tablet by mouth daily., Disp: , Rfl:    omeprazole  (PRILOSEC) 40 MG capsule, TAKE 1 CAPSULE(40 MG) BY MOUTH DAILY, Disp: 90 capsule, Rfl: 3   pregabalin  (LYRICA ) 50 MG capsule, TAKE 1 CAPSULE(50 MG) BY MOUTH TWICE DAILY, Disp: 180 capsule, Rfl: 3   psyllium (METAMUCIL SMOOTH TEXTURE) 28 % packet, Take 1 packet by mouth daily as needed (constipation). , Disp: , Rfl:    tirzepatide  (MOUNJARO ) 5 MG/0.5ML Pen, Inject 5 mg into the skin once a week., Disp: 6 mL, Rfl: 3   traZODone  (DESYREL ) 50 MG tablet, Take 0.5 tablets (25 mg total) by mouth at bedtime as needed for sleep., Disp: 30 tablet, Rfl: 3   dicyclomine  (BENTYL ) 20 MG tablet, TAKE 1 TABLET(20 MG) BY MOUTH THREE TIMES DAILY AFTER MEALS, Disp: 90 tablet, Rfl: 0 Allergies  Allergen Reactions   Neurontin [Gabapentin] Other (See Comments)    Dizziness   Codeine Rash   Fosamax [Alendronate Sodium] Other (See Comments)    Upset stomach    Nsaids Other (See Comments)    Abdominal Pain   ROS: See pertinent positives and negatives per HPI.  OBSERVATIONS/OBJECTIVE:  VITALS per patient if applicable: Today's Vitals   08/18/23 1314  Weight: 280 lb (127 kg)  Height: 5' 3 (1.6 m)   Body mass index is 49.6 kg/m.   GENERAL: Alert and oriented. Appears well and in no acute distress. PSYCH/NEURO: Pleasant and cooperative.  No obvious depression or anxiety. Speech and thought processing grossly intact.  Lab Results:   ASSESSMENT AND PLAN:  Problem List Items Addressed This Visit       Digestive   Irritable bowel syndrome with both constipation and diarrhea   I will renew Ms. Skelton's dicyclomine  20 mg bid.      Relevant Medications   dicyclomine  (BENTYL ) 20 MG tablet  Other   Dysuria   Last UA and culture did not confirm a UTI. There could be some periurethral irritation due tot he Pure Wick suction. We discussed applying a barrier cream. I suspect this is more of a GUSM issue. Her urogynecologist diagnosed this in the past.  I recommend we renew her estradiol  and have her start using this.       Relevant Medications   estradiol  (ESTRACE ) 0.1 MG/GM vaginal cream   Primary insomnia - Primary   We will give a trial of trazodone  25 mg at bedtime.       Relevant Medications   traZODone  (DESYREL ) 50 MG tablet     I discussed the assessment and treatment plan with the patient. The patient was provided an opportunity to ask questions and all were answered. The patient agreed with the plan and demonstrated an understanding of the instructions.   The patient was advised to call back or seek an in-person evaluation if the symptoms worsen or if the condition fails to improve as anticipated.  Return in about 4 weeks (around 09/15/2023) for Reassessment, recommend in person for lab work.SABRA Garnette CHRISTELLA Thedora, MD

## 2023-08-18 NOTE — Telephone Encounter (Signed)
 Pharmacy Patient Advocate Encounter   Received notification from CoverMyMeds that prior authorization for Dicyclomine  HCl 20MG  tablets is required/requested.   Insurance verification completed.   The patient is insured through [employer] .   Per test claim: PA required; PA submitted to above mentioned insurance via CoverMyMeds Key/confirmation #/EOC [id number] Status is pending

## 2023-08-18 NOTE — Assessment & Plan Note (Signed)
 I will renew Jade Ellison's dicyclomine  20 mg bid.

## 2023-08-18 NOTE — Assessment & Plan Note (Signed)
 Last UA and culture did not confirm a UTI. There could be some periurethral irritation due tot he Pure Wick suction. We discussed applying a barrier cream. I suspect this is more of a GUSM issue. Her urogynecologist diagnosed this in the past.  I recommend we renew her estradiol  and have her start using this.

## 2023-08-19 NOTE — Telephone Encounter (Signed)
 Pharmacy Patient Advocate Encounter  Received notification from [employer] that Prior Authorization for Dicyclomine  HCl 20MG  tablet  has been APPROVED from 05/20/23 to 08/17/24   PA #/Case ID/Reference #: [id number]

## 2023-08-24 ENCOUNTER — Ambulatory Visit (INDEPENDENT_AMBULATORY_CARE_PROVIDER_SITE_OTHER): Payer: Medicare (Managed Care)

## 2023-08-24 DIAGNOSIS — Z0000 Encounter for general adult medical examination without abnormal findings: Secondary | ICD-10-CM

## 2023-08-24 DIAGNOSIS — Z5982 Transportation insecurity: Secondary | ICD-10-CM | POA: Diagnosis not present

## 2023-08-24 NOTE — Patient Instructions (Signed)
 Ms. Jade Ellison , Thank you for taking time out of your busy schedule to complete your Annual Wellness Visit with me. I enjoyed our conversation and look forward to speaking with you again next year. I, as well as your care team,  appreciate your ongoing commitment to your health goals. Please review the following plan we discussed and let me know if I can assist you in the future. Your Game plan/ To Do List    Referrals: If you haven't heard from the office you've been referred to, please reach out to them at the phone provided.   Follow up Visits: We will see or speak with you next year for your Next Medicare AWV with our clinical staff Have you seen your provider in the last 6 months (3 months if uncontrolled diabetes)? Yes  Clinician Recommendations:  Aim for 30 minutes of exercise or brisk walking, 6-8 glasses of water, and 5 servings of fruits and vegetables each day.       This is a list of the screenings recommended for you:  Health Maintenance  Topic Date Due   Eye exam for diabetics  Never done   Yearly kidney health urinalysis for diabetes  Never done   Hepatitis C Screening  Never done   DTaP/Tdap/Td vaccine (1 - Tdap) Never done   Zoster (Shingles) Vaccine (1 of 2) Never done   Pneumococcal Vaccine for age over 2 (3 of 3 - PCV20 or PCV21) 01/13/2023   Complete foot exam   07/02/2023   Flu Shot  08/14/2023   COVID-19 Vaccine (7 - 2024-25 season) 09/03/2023*   Hemoglobin A1C  12/04/2023   Yearly kidney function blood test for diabetes  06/29/2024   Medicare Annual Wellness Visit  08/23/2024   DEXA scan (bone density measurement)  Completed   Hepatitis B Vaccine  Aged Out   HPV Vaccine  Aged Out   Meningitis B Vaccine  Aged Out   Colon Cancer Screening  Discontinued  *Topic was postponed. The date shown is not the original due date.    Advanced directives: (Copy Requested) Please bring a copy of your health care power of attorney and living will to the office to be added to  your chart at your convenience. You can mail to [facility] [address] [place], [place] [address] or email to [email or web address] Advance Care Planning is important because it:  [x]  Makes sure you receive the medical care that is consistent with your values, goals, and preferences  [x]  It provides guidance to your family and loved ones and reduces their decisional burden about whether or not they are making the right decisions based on your wishes.  Follow the link provided in your after visit summary or read over the paperwork we have mailed to you to help you started getting your Advance Directives in place. If you need assistance in completing these, please reach out to us  so that we can help you!  See attachments for Preventive Care and Fall Prevention Tips.

## 2023-08-24 NOTE — Progress Notes (Signed)
 Subjective:   Jade Ellison is a 78 y.o. who presents for a Medicare Wellness preventive visit.  As a reminder, Annual Wellness Visits don't include a physical exam, and some assessments may be limited, especially if this visit is performed virtually. We may recommend an in-person follow-up visit with your provider if needed.  Visit Complete: Virtual I connected with  Jade Ellison on 08/24/23 by a audio enabled telemedicine application and verified that I am speaking with the correct person using two identifiers.  Patient Location: Home  Provider Location: Office/Clinic  I discussed the limitations of evaluation and management by telemedicine. The patient expressed understanding and agreed to proceed.  Vital Signs: Because this visit was a virtual/telehealth visit, some criteria may be missing or patient reported. Any vitals not documented were not able to be obtained and vitals that have been documented are patient reported.  VideoError- Librarian, academic were attempted between this provider and patient, however failed, due to patient having technical difficulties OR patient did not have access to video capability.  We continued and completed visit with audio only.   Persons Participating in Visit: Patient assisted by daughter.  AWV Questionnaire: No: Patient Medicare AWV questionnaire was not completed prior to this visit.  Cardiac Risk Factors include: advanced age (>43men, >38 women);diabetes mellitus;dyslipidemia;hypertension     Objective:    Today's Vitals   There is no height or weight on file to calculate BMI.     08/24/2023    3:51 PM 08/03/2023    3:30 PM 06/01/2023   10:32 PM 06/01/2023    4:57 PM 08/22/2022    3:59 PM 11/12/2021    9:10 PM 11/07/2019   10:08 AM  Advanced Directives  Does Patient Have a Medical Advance Directive? No No No Unable to assess, patient is non-responsive or altered mental status No No No  Would patient  like information on creating a medical advance directive? No - Patient declined Yes (MAU/Ambulatory/Procedural Areas - Information given) No - Patient declined   Yes (MAU/Ambulatory/Procedural Areas - Information given) Yes (MAU/Ambulatory/Procedural Areas - Information given)    Current Medications (verified) Outpatient Encounter Medications as of 08/24/2023  Medication Sig   acetaminophen  (TYLENOL ) 325 MG tablet Take 650 mg by mouth every 6 (six) hours as needed for moderate pain or headache.   apixaban  (ELIQUIS ) 5 MG TABS tablet Take 1 tablet (5 mg total) by mouth 2 (two) times daily.   atorvastatin  (LIPITOR) 20 MG tablet Take 1 tablet (20 mg total) by mouth daily.   Blood Glucose Monitoring Suppl DEVI 1 each by Does not apply route in the morning, at noon, and at bedtime. May substitute to any manufacturer covered by patient's insurance.   desonide  (DESOWEN ) 0.05 % cream Apply 1 Application topically 2 (two) times daily as needed (dry skin/irritation).   dicyclomine  (BENTYL ) 20 MG tablet TAKE 1 TABLET(20 MG) BY MOUTH THREE TIMES DAILY AFTER MEALS   DULoxetine  (CYMBALTA ) 60 MG capsule Take 1 capsule (60 mg total) by mouth daily.   estradiol  (ESTRACE ) 0.1 MG/GM vaginal cream Place 1 Applicatorful vaginally at bedtime for 7 days, THEN 1 Applicatorful 2 (two) times a week.   furosemide  (LASIX ) 40 MG tablet Take 1 tablet (40 mg total) by mouth daily.   glipiZIDE  (GLUCOTROL  XL) 5 MG 24 hr tablet Take 1 tablet (5 mg total) by mouth daily.   Glucose Blood (BLOOD GLUCOSE TEST STRIPS) STRP 1 each by In Vitro route every morning. May substitute to  any manufacturer covered by [employer].   HYDROcodone -acetaminophen  (NORCO) 10-325 MG tablet Take 1 tablet by mouth daily as needed for severe pain (pain score 7-10).   Lancets (FREESTYLE) lancets daily.   latanoprost  (XALATAN ) 0.005 % ophthalmic solution Place 1 drop into both eyes at bedtime.    metFORMIN (GLUCOPHAGE) 1000 MG tablet TAKE 1 TABLET BY  MOUTH TWICE DAILY   metoprolol  tartrate (LOPRESSOR ) 50 MG tablet TAKE 1 TABLET(50 MG) BY MOUTH TWICE DAILY   Mouthwashes (BIOTENE/CALCIUM  MT) Take 1 Dose by mouth daily as needed (dry mouth).    Multiple Vitamin (MULTIVITAMIN WITH MINERALS) TABS tablet Take 1 tablet by mouth daily.   omeprazole  (PRILOSEC) 40 MG capsule TAKE 1 CAPSULE(40 MG) BY MOUTH DAILY   pregabalin  (LYRICA ) 50 MG capsule TAKE 1 CAPSULE(50 MG) BY MOUTH TWICE DAILY   psyllium (METAMUCIL SMOOTH TEXTURE) 28 % packet Take 1 packet by mouth daily as needed (constipation).    tirzepatide  (MOUNJARO ) 5 MG/0.5ML Pen Inject 5 mg into the skin once a week.   traZODone  (DESYREL ) 50 MG tablet Take 0.5 tablets (25 mg total) by mouth at bedtime as needed for sleep.   No facility-administered encounter medications on file as of 08/24/2023.    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 pylori)    Hemorrhoids    Hernia of unspecified site of abdominal cavity without mention of obstruction or gangrene    History of blood transfusion    History of kidney stones    Hypertension 09 07 2013   TRANSTHORACIC ECHO STUDY CONCLUSIONS    Hypertension 09 07 2013   EJECTION FRACTION- 55%-60% .WALL MOTION WAS NORMAL   Impaired mobility    Iron  deficiency anemia    Morbid obesity (HCC)    Neuropathy    Obesity    Olecranon bursitis of left elbow    OSA  (obstructive sleep apnea)    On cpap   Osteoarthritis    right hip   Osteoporosis    Primary osteoarthritis    Bilaterally (knee)   PSVT (paroxysmal supraventricular tachycardia) (HCC)    PSVT (paroxysmal supraventricular tachycardia) (HCC)    Short-term memory loss    Shortness of breath dyspnea    Sleep apnea    on CPAP- does not use machine   Spinal headache 1986   Type 2 diabetes mellitus (HCC)    Past Surgical History:  Procedure Laterality Date   BIOPSY  11/07/2019   Procedure: BIOPSY;  Surgeon: Shila Gustav GAILS, MD;  Location: [facility];  Service: Endoscopy;;   CARDIAC CATHETERIZATION  01/13/2001   normal coronary arteries and nomal LV function by cath performed by Dr.Peter Nishan   CESAREAN SECTION  01/14/1984   CHOLECYSTECTOMY     COLONOSCOPY WITH PROPOFOL  N/A 11/07/2019   Procedure:  COLONOSCOPY WITH PROPOFOL ;  Surgeon: Shila Gustav GAILS, MD;  Location: [facility];  Service: Endoscopy;  Laterality: N/A;   ENTEROSCOPY  02/05/2012   Procedure: ENTEROSCOPY;  Surgeon: Princella CHRISTELLA Nida, MD;  Location: [facility];  Service: Endoscopy;  Laterality: N/A;   ESOPHAGOGASTRODUODENOSCOPY N/A 04/19/2014   Procedure: ESOPHAGOGASTRODUODENOSCOPY (EGD);  Surgeon: Princella CHRISTELLA Nida, MD;  Location: [facility];  Service: Endoscopy;  Laterality: N/A;   ESOPHAGOGASTRODUODENOSCOPY (EGD) WITH PROPOFOL  N/A 08/01/2015   Procedure: ESOPHAGOGASTRODUODENOSCOPY (EGD) WITH PROPOFOL ;  Surgeon: Gustav GAILS Shila, MD;  Location: [facility];  Service: Endoscopy;  Laterality: N/A;   ESOPHAGOGASTRODUODENOSCOPY (EGD) WITH PROPOFOL  N/A 11/07/2019   Procedure: ESOPHAGOGASTRODUODENOSCOPY (EGD) WITH PROPOFOL ;  Surgeon: Shila Gustav GAILS, MD;  Location: [facility];  Service: Endoscopy;  Laterality: N/A;   EXTRACORPOREAL SHOCK WAVE LITHOTRIPSY Left    HERNIA REPAIR     HOT HEMOSTASIS  02/05/2012   Procedure: HOT HEMOSTASIS (ARGON PLASMA COAGULATION/BICAP);  Surgeon: Princella CHRISTELLA Nida, MD;  Location: [facility];  Service: Endoscopy;  Laterality: N/A;   KNEE ARTHROSCOPY Left    POLYPECTOMY  11/07/2019   Procedure: POLYPECTOMY;  Surgeon: Shila Gustav GAILS, MD;  Location: [facility];  Service: Endoscopy;;   TUBAL LIGATION     UMBILICAL HERNIA REPAIR     Family History  Problem Relation Age of Onset   Heart disease Mother    Heart disease Father    Diabetes Sister    Stroke Brother    Hypertension Brother    Diabetes Brother    Cancer Brother        Renal   Cancer Brother        Prostate   Heart disease Brother    Heart disease Brother    Cancer Brother        Prostate, bladder   Diabetes Paternal Grandmother    Colon cancer Neg Hx    Social History   Socioeconomic History   Marital status: Widowed    Spouse name: Not on file   Number of children: 2   Years of education: Not on file   Highest education level: Some college, no degree  Occupational History   Occupation: RETIRED    Employer: RETIRED  Tobacco Use   Smoking status: Never    Passive exposure: Never   Smokeless tobacco: Never  Vaping Use   Vaping status: Never Used  Substance and Sexual Activity   Alcohol use: Not Currently    Alcohol/week: 1.0 standard drink of alcohol    Types: 1 Glasses of wine per week    Comment: wine sometimes on holidays and bedtime- rarely   Drug use: Yes    Types: Hydrocodone    Sexual activity: Not Currently  Other Topics Concern   Not on file  Social History Narrative   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   Social Drivers of Corporate investment banker Strain: Low Risk  (08/24/2023)   Overall Financial Resource Strain (CARDIA)    Difficulty of Paying Living Expenses: Not hard at all  Food Insecurity: No Food Insecurity (08/24/2023)   Hunger Vital Sign    Worried About Running Out of Food in the Last Year: Never true    Ran Out of Food in the Last Year: Never true  Transportation Needs: Unmet Transportation Needs (08/24/2023)   PRAPARE - Transportation     Lack of Transportation (Medical): Yes    Lack of Transportation (Non-Medical): Yes  Physical Activity: Sufficiently Active (08/24/2023)   Exercise  Vital Sign    Days of Exercise per Week: 7 days    Minutes of Exercise per Session: 30 min  Stress: No Stress Concern Present (08/24/2023)   [employer] of Occupational Health - Occupational Stress Questionnaire    Feeling of Stress: Not at all  Social Connections: Socially Isolated (08/24/2023)   Social Connection and Isolation Panel    Frequency of Communication with Friends and Family: More than three times a week    Frequency of Social Gatherings with Friends and Family: Once a week    Attends Religious Services: Never    Database administrator or Organizations: No    Attends Banker Meetings: Never    Marital Status: Widowed    Tobacco Counseling Counseling given: Not Answered    Clinical Intake:  Pre-visit preparation completed: Yes  Pain : No/denies pain     Nutritional Risks: None Diabetes: Yes CBG done?: No Did pt. bring in CBG monitor from home?: No  Lab Results  Component Value Date   HGBA1C 6.5 (H) 06/03/2023   HGBA1C 8.1 (H) 02/13/2023   HGBA1C 7.1 (H) 10/08/2022     How often do you need to have someone help you when you read instructions, pamphlets, or other written materials from your doctor or pharmacy?: 1 - Never  Interpreter Needed?: No  Information entered by :: NAllen LPN   Activities of Daily Living     08/24/2023    3:44 PM 06/01/2023   10:32 PM  In your present state of health, do you have any difficulty performing the following activities:  Hearing? 0 0  Vision? 0 0  Difficulty concentrating or making decisions? 0 0  Walking or climbing stairs? 1   Dressing or bathing? 1   Doing errands, shopping? 1 1  Preparing Food and eating ? Y   Using the Toilet? Y   In the past six months, have you accidently leaked urine? Y   Comment incontinent   Do you have problems with  loss of bowel control? Y   Managing your Medications? Y   Managing your Finances? Y   Housekeeping or managing your Housekeeping? Y     Patient Care Team: Thedora Garnette HERO, MD as PCP - General (Family Medicine) Court Dorn PARAS, MD as PCP - Cardiology (Cardiology) Court Dorn PARAS, MD as Consulting Physician (Cardiology) O'Neal, Darryle Ned, MD as Consulting Physician (Cardiology) Rosemarie Eather RAMAN, MD as Consulting Physician (Neurology) Wallace, Juana M, RN as VBCI Care Management  I have updated your Care Teams any recent Medical Services you may have received from other providers in the past year.     Assessment:   This is a routine wellness examination for Jade Ellison.  Hearing/Vision screen Hearing Screening - Comments:: Denies hearing issues Vision Screening - Comments:: Regular eye exams, [facility]   Goals Addressed             This Visit's Progress    Patient Stated       08/24/2023, wants to get out of the bed more and start physical therapy       Depression Screen     08/24/2023    3:53 PM 08/03/2023    3:27 PM 11/18/2022    1:21 PM 08/22/2022    4:01 PM 07/29/2022   10:36 AM 07/02/2022    4:06 PM  PHQ 2/9 Scores  PHQ - 2 Score 0 1 1 0 4 4  PHQ- 9 Score 3   1 14  14    Fall Risk     08/24/2023    3:52 PM 08/03/2023    3:27 PM 07/02/2023    3:54 PM 06/17/2023    5:17 PM 06/11/2023   12:37 PM  Fall Risk   Falls in the past year? Exclusion - non ambulatory 0 0 0 0  Number falls in past yr:   0 0 0  Injury with Fall?   0 0 0  Risk for fall due to : Impaired mobility;Impaired balance/gait;Medication side effect Impaired balance/gait;Impaired mobility     Follow up Falls prevention discussed;Falls evaluation completed Falls prevention discussed;Education provided;Falls evaluation completed       MEDICARE RISK AT HOME:  Medicare Risk at Home Any stairs in or around the home?: Yes (has aramp) If so, are there any without handrails?: No Home free of loose  throw rugs in walkways, pet beds, electrical cords, etc?: Yes Adequate lighting in your home to reduce risk of falls?: Yes Life alert?: No Use of a cane, walker or w/c?: Yes Grab bars in the bathroom?: No (has bed baths) Shower chair or bench in shower?: No (has bed baths) Elevated toilet seat or a handicapped toilet?: No  TIMED UP AND GO:  Was the test performed?  No  Cognitive Function: 6CIT completed    05/04/2018    4:00 PM  MMSE - Mini Mental State Exam  Orientation to time 5  Orientation to Place 5  Registration 3  Attention/ Calculation 3  Recall 3  Language- name 2 objects 2  Language- repeat 1  Language- follow 3 step command 3  Language- read & follow direction 1  Write a sentence 1  Copy design 0  Total score 27        08/24/2023    3:55 PM 08/22/2022    4:02 PM  6CIT Screen  What Year? 0 points 0 points  What month? 0 points 0 points  What time? 0 points 0 points  Count back from 20 0 points 0 points  Months in reverse 0 points 0 points  Repeat phrase 0 points 2 points  Total Score 0 points 2 points    Immunizations Immunization History  Administered Date(s) Administered   Fluad Quad(high Dose 65+) 11/16/2015, 12/01/2016, 09/18/2017, 11/23/2019, 11/29/2020   Fluad Trivalent(High Dose 65+) 11/18/2022   Influenza, High Dose Seasonal PF 10/16/2011, 10/26/2014, 10/06/2018, 10/06/2018   Influenza,trivalent, recombinat, inj, PF 11/16/2015, 12/01/2016, 09/18/2017   Influenza-Unspecified 11/16/2015, 12/01/2016, 09/18/2017   Moderna Covid Bivalent Peds Booster(34mo Thru 54yrs) 11/25/2021   Moderna Sars-Covid-2 Vaccination 02/25/2019, 03/25/2019   PFIZER(Purple Top)SARS-COV-2 Vaccination 11/23/2019   Pfizer Covid-19 Vaccine Bivalent Booster 67yrs & up 11/29/2020, 03/26/2022   Pneumococcal Conjugate-13 11/18/2022   Pneumococcal Polysaccharide-23 09/13/2009   RSV,unspecified 11/25/2021    Screening Tests Health Maintenance  Topic Date Due   OPHTHALMOLOGY  EXAM  Never done   Diabetic kidney evaluation - Urine ACR  Never done   Hepatitis C Screening  Never done   DTaP/Tdap/Td (1 - Tdap) Never done   Zoster Vaccines- Shingrix (1 of 2) Never done   Pneumococcal Vaccine: 50+ Years (3 of 3 - PCV20 or PCV21) 01/13/2023   FOOT EXAM  07/02/2023   INFLUENZA VACCINE  08/14/2023   COVID-19 Vaccine (7 - 2024-25 season) 09/03/2023 (Originally 09/14/2022)   HEMOGLOBIN A1C  12/04/2023   Diabetic kidney evaluation - eGFR measurement  06/29/2024   Medicare Annual Wellness (AWV)  08/23/2024   DEXA SCAN  Completed   Hepatitis B  Vaccines  Aged Out   HPV VACCINES  Aged Out   Meningococcal B Vaccine  Aged Out   Colonoscopy  Discontinued    Health Maintenance  Health Maintenance Due  Topic Date Due   OPHTHALMOLOGY EXAM  Never done   Diabetic kidney evaluation - Urine ACR  Never done   Hepatitis C Screening  Never done   DTaP/Tdap/Td (1 - Tdap) Never done   Zoster Vaccines- Shingrix (1 of 2) Never done   Pneumococcal Vaccine: 50+ Years (3 of 3 - PCV20 or PCV21) 01/13/2023   FOOT EXAM  07/02/2023   INFLUENZA VACCINE  08/14/2023   Health Maintenance Items Addressed: Due for shingles and TDAP.  Additional Screening:  Vision Screening: Recommended annual ophthalmology exams for early detection of glaucoma and other disorders of the eye. Would you like a referral to an eye doctor? No    Dental Screening: Recommended annual dental exams for proper oral hygiene  Community Resource Referral / Chronic Care Management: CRR required this visit?  No   CCM required this visit?  No   Plan:    I have personally reviewed and noted the following in the patient's chart:   Medical and social history Use of alcohol, tobacco or illicit drugs  Current medications and supplements including opioid prescriptions. Patient is not currently taking opioid prescriptions. Functional ability and status Nutritional status Physical activity Advanced directives List of  other physicians Hospitalizations, surgeries, and ER visits in previous 12 months Vitals Screenings to include cognitive, depression, and falls Referrals and appointments  In addition, I have reviewed and discussed with patient certain preventive protocols, quality metrics, and best practice recommendations. A written personalized care plan for preventive services as well as general preventive health recommendations were provided to patient.   Ardella FORBES Dawn, LPN   1/88/7974   After Visit Summary: (MyChart) Due to this being a telephonic visit, the after visit summary with patients personalized plan was offered to patient via MyChart   Notes: Nothing significant to report at this time.

## 2023-08-25 ENCOUNTER — Encounter: Payer: Self-pay | Admitting: Family Medicine

## 2023-08-26 ENCOUNTER — Telehealth: Payer: Self-pay | Admitting: *Deleted

## 2023-08-26 NOTE — Progress Notes (Signed)
 Complex Care Management Note  Care Guide Note 08/26/2023 Name: [name] MRN: [id number] DOB: Sep 25, 1945  Jade Ellison is a 78 y.o. year old female who sees Rudd, Garnette HERO, MD for primary care. I reached out to Jade Ellison and daughter Jade Ellison by phone today to offer complex care management services.  [name] was given information about Complex Care Management services today including:   The Complex Care Management services include support from the care team which includes your Nurse Care Manager, Clinical Social Worker, or Pharmacist.  The Complex Care Management team is here to help remove barriers to the health concerns and goals most important to you. Complex Care Management services are voluntary, and the patient may decline or stop services at any time by request to their care team member.   Complex Care Management Consent Status: Patient agreed to services and verbal consent obtained.   Follow up plan:  Telephone appointment with complex care management team member scheduled for:  09/04/2023  Encounter Outcome:  Patient Scheduled  Thedford Franks, CMA [place]  [facility], [facility] Guide Direct Dial: [phone]  Fax: [phone] Website: [email or web address]

## 2023-08-27 ENCOUNTER — Telehealth: Payer: Self-pay | Admitting: *Deleted

## 2023-08-27 NOTE — Progress Notes (Signed)
 Complex Care Management Note Care Guide Note  08/27/2023 Name: EYVETTE CORDON MRN: [id number] DOB: 1945/11/22  Orlean LITTIE Pan is a 78 y.o. year old female who is a primary care patient of Rudd, Garnette HERO, MD . The community resource team was consulted for assistance with Transportation Needs  and Caregiver Stress  SDOH screenings and interventions completed:  Yes     SDOH Interventions Today    Flowsheet Row Most Recent Value  SDOH Interventions   Transportation Interventions SCAT (Specialized Community Area Transporation)  Armed forces training and education officer to daughter and faxed to dr]  Stress Interventions Community Resources Provided  Teacher, English as a foreign language resources provided]  Social Connections Interventions Community Resources Provided  [place] information on social senior programs]   Patients daughter provided information on transportation and applications and also caregiver stress information provided   Care guide performed the following interventions: Patient provided with information about care guide support team and interviewed to confirm resource needs.  Follow Up Plan:  No further follow up planned at this time. The patient has been provided with needed resources.  Encounter Outcome:  Patient Visit Completed Edi Gorniak Greenauer-Moran  [facility] HealthPopulation Health Care Guide  Direct [phone] Fax:[phone] Website: [email or web address]

## 2023-08-31 ENCOUNTER — Encounter: Payer: Self-pay | Admitting: Family Medicine

## 2023-09-04 ENCOUNTER — Other Ambulatory Visit: Payer: Medicare (Managed Care) | Admitting: Licensed Clinical Social Worker

## 2023-09-06 NOTE — Patient Outreach (Signed)
 Complex Care Management   Visit Note  09/04/2023  Name:  Jade Ellison MRN: [id number] DOB: August 12, 1945  Situation: Referral received for Complex Care Management related to Mental/Behavioral Health diagnosis sadness, caregiver strain from family and SDOH Barriers:  Transportation I obtained verbal consent from Caregiver.  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 pylori)    Hemorrhoids    Hernia of unspecified site of abdominal cavity without mention of obstruction or gangrene    History of blood transfusion    History of kidney stones    Hypertension 09 07 2013   TRANSTHORACIC ECHO STUDY CONCLUSIONS    Hypertension 09 07 2013   EJECTION FRACTION- 55%-60% .WALL MOTION WAS NORMAL   Impaired mobility    Iron  deficiency anemia    Morbid obesity (HCC)    Neuropathy    Obesity    Olecranon bursitis of left elbow    OSA (obstructive sleep apnea)    On cpap   Osteoarthritis    right hip   Osteoporosis    Primary osteoarthritis    Bilaterally (knee)   PSVT (paroxysmal supraventricular tachycardia) (HCC)    PSVT (paroxysmal supraventricular tachycardia) (HCC)    Short-term memory loss    Shortness of breath dyspnea    Sleep apnea    on CPAP- does not use machine   Spinal headache 1986   Type 2 diabetes mellitus (HCC)     Assessment: Patient Reported Symptoms:  Cognitive Cognitive Status: Alert and oriented to person, place, and time,  Normal speech and language skills      Neurological Neurological Review of Symptoms: Other: [place] Neurological Symptoms/Conditions [RPT]: Memory concerns (2 strokes in the past) Neurological Management Strategies: Adequate rest, Coping strategies, Routine screening, Medication therapy Neurological Self-Management Outcome: 4 (good)  Endocrine Endocrine Symptoms Reported: No symptoms reported Is patient diabetic?: Yes Endocrine Self-Management Outcome: 3 (uncertain)  Gastrointestinal Gastrointestinal Symptoms Reported: No symptoms reported      Genitourinary Genitourinary Symptoms Reported: Incontinence    Integumentary Integumentary Symptoms Reported: Wound Skin Management Strategies: Routine screening  Musculoskeletal Musculoskelatal Symptoms Reviewed: Difficulty walking, Limited mobility        Psychosocial Psychosocial Symptoms Reported: Sadness - if selected complete PHQ 2-9 Additional Psychological Details: daughter expresses caregiver stress. she is primary caregiver for patient in addition is taking care of 3 children. patient expresses some sadness re: her health status. Behavioral Management Strategies: Adequate rest, Medication therapy Behavioral Health Self-Management Outcome: 3 (uncertain) Major Change/Loss/Stressor/Fears (CP): Medical condition, self Do you feel physically threatened by others?: No    09/06/2023    PHQ2-9 Depression Screening   Little interest or pleasure in doing things Not at all  Feeling down, depressed, or hopeless Several days  PHQ-2 - Total Score 1  Trouble falling or staying asleep, or  sleeping too much    Feeling tired or having little energy    Poor appetite or overeating     Feeling bad about yourself - or that you are a failure or have let yourself or your family down    Trouble concentrating on things, such as reading the newspaper or watching television    Moving or speaking so slowly that other people could have noticed.  Or the opposite  - being so fidgety or restless that you have been moving around a lot more than usual    Thoughts that you would be better off dead, or hurting yourself in some way    PHQ2-9 Total Score    If you checked off any problems, how difficult have these problems made it for you to do your work, take care of things at home, or get along with other people    Depression Interventions/Treatment      There were no vitals filed for this visit.  Medications Reviewed Today     Reviewed by Merlynn Lyle CROME, LCSW (Social Worker) on 09/06/23 at 1640  Med List Status: <None>   Medication Order Taking? Sig Documenting Provider Last Dose Status Informant  acetaminophen  (TYLENOL ) 325 MG tablet [id number]  Take 650 mg by mouth every 6 (six) hours as needed for moderate pain or headache. [provider]  Active Self, Child, Pharmacy Records  apixaban  (ELIQUIS ) 5 MG TABS tablet [id number]  Take 1 tablet (5 mg total) by mouth 2 (two) times daily. Thedora Garnette HERO, MD  Active Self, Child, Pharmacy Records  atorvastatin  (LIPITOR) 20 MG tablet [id number]  Take 1 tablet (20 mg total) by mouth daily. Thedora Garnette HERO, MD  Active Self, Child, Pharmacy Records  Blood Glucose Monitoring Suppl DEVI [id number]  1 each by Does not apply route in the morning, at noon, and at bedtime. May substitute to any manufacturer covered by patient's insurance. Thedora Garnette HERO, MD  Active   desonide  (DESOWEN ) 0.05 % cream [id number]  Apply 1 Application topically 2 (two) times daily as needed (dry skin/irritation). Thedora Garnette HERO, MD  Active Self, Child, Pharmacy Records  dicyclomine  (BENTYL ) 20 MG tablet [id number]  TAKE 1 TABLET(20 MG) BY MOUTH THREE TIMES DAILY AFTER MEALS Thedora Garnette HERO, MD  Active   DULoxetine  (CYMBALTA ) 60 MG capsule [id number]  Take 1 capsule (60 mg total) by mouth daily. Thedora Garnette HERO, MD  Active Self, Child, Pharmacy Records  estradiol  (ESTRACE ) 0.1 MG/GM vaginal cream [id number]  Place 1 Applicatorful vaginally at  bedtime for 7 days, THEN 1 Applicatorful 2 (two) times a week. Thedora Garnette HERO, MD  Active   furosemide  (LASIX ) 40 MG tablet [id number]  Take 1 tablet (40 mg total) by mouth daily. Daneen Damien BROCKS, NP  Expired 08/24/23 2359 Self, Child, Pharmacy Records  glipiZIDE  (GLUCOTROL  XL) 5 MG 24 hr tablet [id number]  Take 1 tablet (5 mg total) by mouth daily. Thedora Garnette HERO, MD  Active Self, Child, Pharmacy Records  Glucose Blood (BLOOD GLUCOSE TEST STRIPS) STRP [id number]  1 each by In Vitro route every morning. May substitute to any manufacturer covered by patient's insurance. Thedora Garnette HERO, MD  Active   HYDROcodone -acetaminophen  [facility]) 10-325 MG tablet [id number]  Take 1 tablet by mouth daily as needed for severe pain (pain score 7-10). Thedora Garnette HERO, MD  Active   Lancets (FREESTYLE) lancets [id number]  daily. [provider]  Active   latanoprost  (XALATAN ) 0.005 % ophthalmic solution [id number]  Place 1 drop into  both eyes at bedtime.  [provider]  Active Self, Child, Pharmacy Records  metFORMIN (GLUCOPHAGE) 1000 MG tablet [id number]  TAKE 1 TABLET BY MOUTH TWICE DAILY Thedora Garnette HERO, MD  Active   metoprolol  tartrate (LOPRESSOR ) 50 MG tablet [id number]  TAKE 1 TABLET(50 MG) BY MOUTH TWICE DAILY Thedora Garnette HERO, MD  Active   Mouthwashes (BIOTENE/CALCIUM  MT) [id number]  Take 1 Dose by mouth daily as needed (dry mouth).  [provider]  Active Self, Child, Pharmacy Records  Multiple Vitamin (MULTIVITAMIN WITH MINERALS) TABS tablet [id number]  Take 1 tablet by mouth daily. [provider]  Active Self, Child, Pharmacy Records  omeprazole  (PRILOSEC) 40 MG capsule [id number]  TAKE 1 CAPSULE(40 MG) BY MOUTH DAILY Thedora Garnette HERO, MD  Active Self, Child, Pharmacy Records  pregabalin  (LYRICA ) 50 MG capsule [id number]  TAKE 1 CAPSULE(50 MG) BY MOUTH TWICE DAILY Thedora Garnette HERO, MD  Active Self, Child, Pharmacy Records  psyllium (METAMUCIL SMOOTH TEXTURE) 28 % packet [id number]  Take 1  packet by mouth daily as needed (constipation).  [provider]  Active Self, Child, Pharmacy Records           Med Note TORRIE, LONELL CROME   Mon Oct 07, 2021  8:46 AM)    tirzepatide  (MOUNJARO ) 5 MG/0.5ML Pen [id number]  Inject 5 mg into the skin once a week. Thedora Garnette HERO, MD  Active Self, Child, Pharmacy Records           Med Note [place], [place] R   Thu May 21, 2023  2:39 PM)    traZODone  (DESYREL ) 50 MG tablet [id number]  Take 0.5 tablets (25 mg total) by mouth at bedtime as needed for sleep. Thedora Garnette HERO, MD  Active             Recommendation:   PCP Follow-up Continue Current Plan of Care  Follow Up Plan:   Telephone follow-up in 1 month  Lyle Rung, BSW, MSW, LCSW Licensed Clinical Social Worker [facility]   [email or web address] Direct Dial: [phone]

## 2023-09-06 NOTE — Patient Instructions (Addendum)
 Visit Information  Thank you for taking time to visit with me today. Please don't hesitate to contact me if I can be of assistance to you before our next scheduled appointment.  Our next appointment is 09/17/23 at 9:15 am Please call the care guide team at [phone] if you need to cancel or reschedule your appointment.   Following is a copy of your care plan:   Goals Addressed             This Visit's Progress    VBCI Social Work Care Plan       Problems:   Transportation  CSW Clinical Goal(s):   Over the next 3 weeks the Caregiver will explore community resource options for unmet needs related to Transportation.  Interventions:  Social Determinants of Health in Patient with diabetes : SDOH assessments completed: Transportation Evaluation of current treatment plan related to unmet needs Transportation resources: encouraged pt to contact [employer] for possible wheelchair hitch  Findhelp referral placed for [employer]   Patient Goals/Self-Care Activities:  Contact resources provided for wheelchair transportation assistance   Plan:   Telephone follow up appointment with care management team member scheduled for:           Please call the Suicide and Crisis Lifeline: 988 call the [place]  National Suicide Prevention Lifeline: 765-549-4947 or TTY: 680-105-3850 TTY 519-488-6879) to talk to a trained counselor go to [facility] [address] [place] [phone]) call 911 if you are experiencing a Mental Health or Behavioral Health Crisis or need someone to talk to.  Patient verbalizes understanding of instructions and care plan provided today and agrees to view in MyChart. Active MyChart status and patient understanding of how to access instructions and care plan via MyChart confirmed with patient.     Lyle Rung, BSW, MSW, LCSW Licensed Clinical Social Worker [facility]   [facility] [email or web address] Direct Dial: [phone]

## 2023-09-09 ENCOUNTER — Other Ambulatory Visit: Payer: Self-pay | Admitting: Family Medicine

## 2023-09-09 DIAGNOSIS — I480 Paroxysmal atrial fibrillation: Secondary | ICD-10-CM

## 2023-09-16 ENCOUNTER — Encounter: Payer: Self-pay | Admitting: Family Medicine

## 2023-09-17 ENCOUNTER — Other Ambulatory Visit: Payer: Medicare (Managed Care) | Admitting: Licensed Clinical Social Worker

## 2023-09-17 ENCOUNTER — Telehealth (INDEPENDENT_AMBULATORY_CARE_PROVIDER_SITE_OTHER): Payer: Medicare (Managed Care) | Admitting: Internal Medicine

## 2023-09-17 DIAGNOSIS — R300 Dysuria: Secondary | ICD-10-CM | POA: Diagnosis not present

## 2023-09-17 MED ORDER — SULFAMETHOXAZOLE-TRIMETHOPRIM 800-160 MG PO TABS
1.0000 | ORAL_TABLET | Freq: Two times a day (BID) | ORAL | 0 refills | Status: AC
Start: 2023-09-17 — End: 2023-09-24

## 2023-09-17 NOTE — Progress Notes (Signed)
 [facility] [address] [place] [address] Dept: [phone] Dept Fax: [phone]  Virtual Video Visit  I connected with Jade Ellison on 09/21/23 at  4:00 PM EDT by a video enabled telemedicine application and verified that I am speaking with the correct person using two identifiers.   Location patient: Home Location provider: Clinic Total time: 7 minutes Persons participating in the virtual visit: Patient; Angelyn Hollingsworth CMA; Rosina Senters, FNP-C  I discussed the limitations of evaluation and management by telemedicine and the availability of in-person appointments. The patient expressed understanding and agreed to proceed.  Chief Complaint  Patient presents with   Dysuria    Burning sensation when voiding. Pt uses Purewick device at home due to incontenience. Pat's daughter, Burnard, noticed sediment in the bottom of the canister and is concerned about possible UTI. Urine cloudy, yellow, no foul odor present. Pt has not c/o flank or abdominal pain. Pt was septic in May due to a UTI, so daughter is very concerned.     SUBJECTIVE:  HPI:   History of Present Illness   Jade Ellison is a 78 year old female with a history of urinary tract infections who presents with burning during urination. She is accompanied by her daughter, Burnard.  She has been experiencing a burning sensation during urination for the past three to four weeks. The sensation is described as burning rather than pain, and there is no hematuria. Her urine appears clear, although her daughter noted some sediment in her Purewick canister yesterday. Associated foul odor in urine.   There are no associated symptoms such as abdominal pain, fever, nausea, or vomiting. Her daughter has been monitoring her temperature and has not observed any fever.   She has a history of being admitted to the hospital a few months ago for sepsis secondary to a urinary tract  infection.     The following portions of the patient's history were reviewed and updated as appropriate: medical history, surgical history, medications, allergies, social history, and family history.    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 pylori)    Hemorrhoids    Hernia of unspecified site of abdominal cavity without mention of obstruction or gangrene    History of blood transfusion    History of kidney stones    Hypertension 09 07 2013   TRANSTHORACIC ECHO STUDY CONCLUSIONS    Hypertension 09 07 2013   EJECTION FRACTION- 55%-60% .WALL MOTION WAS NORMAL   Impaired mobility    Iron  deficiency anemia    Morbid obesity (HCC)    Neuropathy    Obesity    Olecranon bursitis of left elbow    OSA (obstructive sleep apnea)    On cpap   Osteoarthritis    right hip   Osteoporosis    Primary osteoarthritis  Bilaterally (knee)   PSVT (paroxysmal supraventricular tachycardia) (HCC)    PSVT (paroxysmal supraventricular tachycardia) (HCC)    Short-term memory loss    Shortness of breath dyspnea    Sleep apnea    on CPAP- does not use machine   Spinal headache 1986   Type 2 diabetes mellitus (HCC)    Past Surgical History:  Procedure Laterality Date   BIOPSY  11/07/2019   Procedure: BIOPSY;  Surgeon: Shila Gustav GAILS, MD;  Location: [facility];  Service: Endoscopy;;   CARDIAC CATHETERIZATION  01/13/2001   normal coronary arteries and nomal LV function by cath performed by Dr.Peter Nishan    CESAREAN SECTION  01/14/1984   CHOLECYSTECTOMY     COLONOSCOPY WITH PROPOFOL  N/A 11/07/2019   Procedure: COLONOSCOPY WITH PROPOFOL ;  Surgeon: Shila Gustav GAILS, MD;  Location: [facility];  Service: Endoscopy;  Laterality: N/A;   ENTEROSCOPY  02/05/2012   Procedure: ENTEROSCOPY;  Surgeon: Princella CHRISTELLA Nida, MD;  Location: [facility];  Service: Endoscopy;  Laterality: N/A;   ESOPHAGOGASTRODUODENOSCOPY N/A 04/19/2014   Procedure: ESOPHAGOGASTRODUODENOSCOPY (EGD);  Surgeon: Princella CHRISTELLA Nida, MD;  Location: [facility];  Service: Endoscopy;  Laterality: N/A;   ESOPHAGOGASTRODUODENOSCOPY (EGD) WITH PROPOFOL  N/A 08/01/2015   Procedure: ESOPHAGOGASTRODUODENOSCOPY (EGD) WITH PROPOFOL ;  Surgeon: Gustav GAILS Shila, MD;  Location: [facility];  Service: Endoscopy;  Laterality: N/A;   ESOPHAGOGASTRODUODENOSCOPY (EGD) WITH PROPOFOL  N/A 11/07/2019   Procedure: ESOPHAGOGASTRODUODENOSCOPY (EGD) WITH PROPOFOL ;  Surgeon: Shila Gustav GAILS, MD;  Location: [facility];  Service: Endoscopy;  Laterality: N/A;   EXTRACORPOREAL SHOCK WAVE LITHOTRIPSY Left    HERNIA REPAIR     HOT HEMOSTASIS  02/05/2012   Procedure: HOT HEMOSTASIS (ARGON PLASMA COAGULATION/BICAP);  Surgeon: Princella CHRISTELLA Nida, MD;  Location: [facility];  Service: Endoscopy;  Laterality: N/A;   KNEE ARTHROSCOPY Left    POLYPECTOMY  11/07/2019   Procedure: POLYPECTOMY;  Surgeon: Shila Gustav GAILS, MD;  Location: [facility];  Service: Endoscopy;;   TUBAL LIGATION     UMBILICAL HERNIA REPAIR       Current Outpatient Medications:    acetaminophen  (TYLENOL ) 325 MG tablet, Take 650 mg by mouth every 6 (six) hours as needed for moderate pain or headache., Disp: , Rfl:    atorvastatin  (LIPITOR) 20 MG tablet, Take 1 tablet (20 mg total) by mouth daily., Disp: 90 tablet, Rfl: 3   Blood Glucose Monitoring Suppl DEVI, 1 each by Does not apply route in the morning, at noon, and at bedtime. May substitute to any manufacturer covered by patient's insurance.,  Disp: 1 each, Rfl: 0   desonide  (DESOWEN ) 0.05 % cream, Apply 1 Application topically 2 (two) times daily as needed (dry skin/irritation)., Disp: 30 g, Rfl: 1   dicyclomine  (BENTYL ) 20 MG tablet, TAKE 1 TABLET(20 MG) BY MOUTH THREE TIMES DAILY AFTER MEALS, Disp: 90 tablet, Rfl: 0   DULoxetine  (CYMBALTA ) 60 MG capsule, Take 1 capsule (60 mg total) by mouth daily., Disp: 90 capsule, Rfl: 3   ELIQUIS  5 MG TABS tablet, TAKE 1 TABLET(5 MG) BY MOUTH TWICE DAILY, Disp: 60 tablet, Rfl: 5   estradiol  (ESTRACE ) 0.1 MG/GM vaginal cream, Place 1 Applicatorful vaginally at bedtime for 7 days, THEN 1 Applicatorful 2 (two) times a week., Disp: 42.5 g, Rfl: 12   furosemide  (LASIX ) 40 MG tablet, Take 1 tablet (40 mg total) by mouth daily., Disp: 90 tablet, Rfl: 3   glipiZIDE  (GLUCOTROL  XL) 5 MG 24 hr tablet, Take 1 tablet (5 mg total) by mouth daily., Disp:  90 tablet, Rfl: 3   Glucose Blood (BLOOD GLUCOSE TEST STRIPS) STRP, 1 each by In Vitro route every morning. May substitute to any manufacturer covered by patient's insurance., Disp: 100 strip, Rfl: 0   HYDROcodone -acetaminophen  (NORCO) 10-325 MG tablet, Take 1 tablet by mouth daily as needed for severe pain (pain score 7-10)., Disp: 12 tablet, Rfl: 0   Lancets (FREESTYLE) lancets, daily., Disp: , Rfl:    latanoprost  (XALATAN ) 0.005 % ophthalmic solution, Place 1 drop into both eyes at bedtime. , Disp: , Rfl:    metFORMIN (GLUCOPHAGE) 1000 MG tablet, TAKE 1 TABLET BY MOUTH TWICE DAILY, Disp: 180 tablet, Rfl: 3   metoprolol  tartrate (LOPRESSOR ) 50 MG tablet, TAKE 1 TABLET(50 MG) BY MOUTH TWICE DAILY, Disp: 180 tablet, Rfl: 1   Mouthwashes (BIOTENE/CALCIUM  MT), Take 1 Dose by mouth daily as needed (dry mouth). , Disp: , Rfl:    Multiple Vitamin (MULTIVITAMIN WITH MINERALS) TABS tablet, Take 1 tablet by mouth daily., Disp: , Rfl:    omeprazole  (PRILOSEC) 40 MG capsule, TAKE 1 CAPSULE(40 MG) BY MOUTH DAILY, Disp: 90 capsule, Rfl: 3   pregabalin  (LYRICA ) 50 MG capsule,  TAKE 1 CAPSULE(50 MG) BY MOUTH TWICE DAILY, Disp: 180 capsule, Rfl: 3   psyllium (METAMUCIL SMOOTH TEXTURE) 28 % packet, Take 1 packet by mouth daily as needed (constipation). , Disp: , Rfl:    sulfamethoxazole -trimethoprim  (BACTRIM  DS) 800-160 MG tablet, Take 1 tablet by mouth 2 (two) times daily for 7 days., Disp: 14 tablet, Rfl: 0   tirzepatide  (MOUNJARO ) 5 MG/0.5ML Pen, Inject 5 mg into the skin once a week., Disp: 6 mL, Rfl: 3   traZODone  (DESYREL ) 50 MG tablet, Take 0.5 tablets (25 mg total) by mouth at bedtime as needed for sleep., Disp: 30 tablet, Rfl: 3 Allergies  Allergen Reactions   Neurontin [Gabapentin] Other (See Comments)    Dizziness   Codeine Rash   Fosamax [Alendronate Sodium] Other (See Comments)    Upset stomach    Nsaids Other (See Comments)    Abdominal Pain    Social History   Socioeconomic History   Marital status: Widowed    Spouse name: Not on file   Number of children: 2   Years of education: Not on file   Highest education level: Some college, no degree  Occupational History   Occupation: RETIRED    Employer: RETIRED  Tobacco Use   Smoking status: Never    Passive exposure: Never   Smokeless tobacco: Never  Vaping Use   Vaping status: Never Used  Substance and Sexual Activity   Alcohol use: Not Currently    Alcohol/week: 1.0 standard drink of alcohol    Types: 1 Glasses of wine per week    Comment: wine sometimes on holidays and bedtime- rarely   Drug use: Yes    Types: Hydrocodone    Sexual activity: Not Currently  Other Topics Concern   Not on file  Social History Narrative   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   Social Drivers of Corporate investment banker Strain: Low Risk  (08/24/2023)   Overall Financial Resource Strain (CARDIA)    Difficulty of Paying Living Expenses: Not hard at all  Food Insecurity: No Food Insecurity (09/17/2023)   Hunger Vital Sign    Worried About Running Out of Food in the Last Year: Never true     Ran Out of Food in the Last Year: Never true  Transportation Needs: Unmet Transportation Needs (09/17/2023)  PRAPARE - Administrator, Civil Service (Medical): Yes    Lack of Transportation (Non-Medical): Yes  Physical Activity: Sufficiently Active (08/24/2023)   Exercise Vital Sign    Days of Exercise per Week: 7 days    Minutes of Exercise per Session: 30 min  Stress: No Stress Concern Present (09/04/2023)   [employer] of Occupational Health - Occupational Stress Questionnaire    Feeling of Stress: Not at all  Social Connections: Socially Isolated (08/24/2023)   Social Connection and Isolation Panel    Frequency of Communication with Friends and Family: More than three times a week    Frequency of Social Gatherings with Friends and Family: Once a week    Attends Religious Services: Never    Database administrator or Organizations: No    Attends Banker Meetings: Never    Marital Status: Widowed  Intimate Partner Violence: Not At Risk (09/04/2023)   Humiliation, Afraid, Rape, and Kick questionnaire    Fear of Current or Ex-Partner: No    Emotionally Abused: No    Physically Abused: No    Sexually Abused: No    Family History  Problem Relation Age of Onset   Heart disease Mother    Heart disease Father    Diabetes Sister    Stroke Brother    Hypertension Brother    Diabetes Brother    Cancer Brother        Renal   Cancer Brother        Prostate   Heart disease Brother    Heart disease Brother    Cancer Brother        Prostate, bladder   Diabetes Paternal Grandmother    Colon cancer Neg Hx      ROS: A complete ROS was performed with pertinent positives/negatives noted in the HPI. The remainder of the ROS are negative.    OBJECTIVE:  VITALS per patient if applicable: Today's Vitals   There is no height or weight on file to calculate BMI.   GENERAL: Alert and oriented. Appears well and in no acute distress. HEENT: Atraumatic.  Conjunctiva clear. No obvious abnormalities on inspection of external nose and ears. NECK: Normal movements of the head and neck. LUNGS: On inspection, no signs of respiratory distress. Breathing rate appears normal. No obvious gross SOB, gasping or wheezing, and no conversational dyspnea. CV: No obvious cyanosis. MS: Moves all visible extremities without noticeable abnormality. PSYCH/NEURO: Pleasant and cooperative. No obvious depression or anxiety. Speech and thought processing grossly intact.  ASSESSMENT AND PLAN: Assessment and Plan    Urinary tract infection Possible UTI based on symptoms and history. - Prescribed Bactrim  due to previous effectiveness. - Instructed to bring urine sample for analysis. - Advised to start antibiotic treatment tonight. - Follow up after urine sample results are available.       I discussed the assessment and treatment plan with the patient. The patient was provided an opportunity to ask questions and all were answered. The patient agreed with the plan and demonstrated an understanding of the instructions.   The patient was advised to call back or seek an in-person evaluation if the symptoms worsen or if the condition fails to improve as anticipated.  Return if symptoms worsen or fail to improve.  Rosina Senters, FNP

## 2023-09-17 NOTE — Patient Instructions (Signed)
 Visit Information  Thank you for taking time to visit with me today. Please don't hesitate to contact me if I can be of assistance to you before our next scheduled appointment.  Following is a copy of your care plan:   Goals Addressed             This Visit's Progress    VBCI Social Work Care Plan       Problems:   Depression/Anxiety Transportation barriers Caregiver   CSW Clinical Goal(s):   Over the next 8 weeks the Caregiver will explore community resource options for unmet needs related to Transportation and Depression/Anxiety management  Interventions:  Inter-disciplinary care team collaboration (see longitudinal plan of care) Patient provided patient history.  Assessment of needs, progress, barriers completed. Advised patient to answer all calls from care providers and to keep phone nearby. Advised parents to contact 911 or 988 if a crisis occurs.  Clinical interventions provided:Solution-Focused Strategies, Active listening / Reflection utilized , Problem Solving /Task Center ,  Patient's main support network includes daughter Charleen. [facility] resource education provided to family and resources emailed successfully.    Social Determinants of Health in Patient with diabetes : SDOH assessments completed: Transportation Evaluation of current treatment plan related to unmet needs Transportation resources: encouraged pt to contact [employer] for possible wheelchair hitch  Findhelp referral placed for [employer] application received per daughter Stress management resource education provided  Patient Goals/Self-Care Activities:  Contact resources provided for wheelchair transportation assistance   Plan:   Telephone follow up appointment with care management team member scheduled for:  10/01/23 at 10:45 am        Please call the Suicide and Crisis Lifeline: 988 call the [place]  National Suicide Prevention Lifeline: 878-371-7282 or TTY: 321-195-8460 TTY  580-600-8897) to talk to a trained counselor call 1-800-273-TALK (toll free, 24 hour hotline) go to [facility] [address], [place] [phone]) call 911 if you are experiencing a Mental Health or Behavioral Health Crisis or need someone to talk to.  Patient verbalizes understanding of instructions and care plan provided today and agrees to view in MyChart. Active MyChart status and patient understanding of how to access instructions and care plan via MyChart confirmed with patient.        24- Hour Availability:    [facility]  [address] [place], [place] [phone] Crisis [phone]   [facility] [phone]  [place] [facility]  [phone]    [facility]  [phone] (after hours)   Therapeutic Alternative/Mobile Crisis   [phone]   [place]  National Suicide Hotline  (947)413-8072 [place]) OR 988   Call 988 for mental health emergencies   [facility]  [phone]);  [place] and [employer]  [phone]); [place], [place], [place], [place], [place], [place], [place]    [facility] for [facility] Residents For 24/7 walk-up access to mental health services for [facility] children (4+), adolescents and adults, please visit the [facility] located at [address] in [place], [place].  *[facility] also provides comprehensive outpatient behavioral health services in a variety of locations around the [place].  Connect With Us  [address] [place], [place] [address] HelpLine: (316)024-0619 or [phone]  Get Directions  Find Help 24/7 By Phone Call our 24-hour HelpLine at 9790569748 or [phone] for immediate assistance for mental health and  substance abuse issues.  Walk-In Help [place]: [facility] (Ages 4 and Up) [place]: Emergency Dept., [facility] Additional Resources National Hopeline Network: 1-800-SUICIDE The National Suicide Prevention Lifeline: 8-199-726-UJOX     Lyle Rung, BSW, MSW, LCSW Licensed Clinical Social Worker [employer]   [facility] [email or web address] Direct Dial: [phone]

## 2023-09-17 NOTE — Patient Outreach (Signed)
 Complex Care Management   Visit Note  09/17/2023  Name:  Jade Ellison MRN: [id number] DOB: 01/22/1945  Situation: Referral received for Complex Care Management related to Mental/Behavioral Health diagnosis depression/anxiety. I obtained verbal consent from Caregiver.  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 pylori)    Hemorrhoids    Hernia of unspecified site of abdominal cavity without mention of obstruction or gangrene    History of blood transfusion    History of kidney stones    Hypertension 09 07 2013   TRANSTHORACIC ECHO STUDY CONCLUSIONS    Hypertension 09 07 2013   EJECTION FRACTION- 55%-60% .WALL MOTION WAS NORMAL   Impaired mobility    Iron  deficiency anemia    Morbid obesity (HCC)    Neuropathy    Obesity    Olecranon bursitis of left elbow    OSA (obstructive sleep apnea)    On cpap   Osteoarthritis    right hip   Osteoporosis    Primary osteoarthritis    Bilaterally (knee)   PSVT (paroxysmal supraventricular tachycardia) (HCC)    PSVT (paroxysmal supraventricular tachycardia) (HCC)    Short-term memory loss    Shortness of breath dyspnea    Sleep apnea    on CPAP- does not use machine   Spinal headache 1986   Type 2 diabetes mellitus (HCC)     Assessment: Patient Reported Symptoms:  Cognitive Cognitive Status: Able to follow simple commands, Alert and oriented to person, place, and time, Normal speech and  language skills Cognitive/Intellectual Conditions Management [RPT]: None reported or documented in medical history or problem list   Health Maintenance Behaviors: Annual physical exam Healing Pattern: Average  Neurological Neurological Review of Symptoms: Other: [place] Neurological Symptoms/Conditions [RPT]: Memory concerns (2 strokes in the past) Neurological Management Strategies: Adequate rest, Coping strategies, Routine screening Neurological Self-Management Outcome: 4 (good)  HEENT HEENT Symptoms Reported: No symptoms reported      Cardiovascular Cardiovascular Symptoms Reported: No symptoms reported    Respiratory Respiratory Symptoms Reported: No symptoms reported    Psychosocial Psychosocial Symptoms Reported: Sadness - if selected complete PHQ 2-9 Additional Psychological Details: daughter expresses caregiver stress. she is primary caregiver for patient in addition is taking care of 3 children. patient expresses some sadness re: her health status. BH resources sent to daughter Behavioral Management Strategies: Adequate rest, Medication therapy Behavioral Health Self-Management Outcome: 3 (uncertain) Major Change/Loss/Stressor/Fears (CP): Medical condition, self Techniques to Cope with Loss/Stress/Change: Diversional activities, Medication Quality of Family Relationships: involved, helpful Do you feel physically threatened by others?: No      07/02/2022    4:06 PM  GAD 7 : Generalized Anxiety Score  Nervous, Anxious, on Edge 2  Control/stop worrying 2  Worry too much - different things 2  Trouble relaxing 2  Restless 0  Easily annoyed or irritable 2  Afraid - awful might happen 2  Total GAD 7 Score 12  Anxiety Difficulty Very difficult    09/17/2023    PHQ2-9 Depression Screening   Little interest or pleasure in doing things Not at all  Feeling down, depressed, or hopeless More than half the days  PHQ-2 - Total Score 2  Trouble falling or staying asleep, or sleeping  too much Nearly every day  Feeling tired or having little energy Several days  Poor appetite or overeating  Several days  Feeling bad about yourself - or that you are a failure or have let yourself or your family down Not at all  Trouble concentrating on things, such as reading the newspaper or watching television Not at all  Moving or speaking so slowly that other people could have noticed.  Or the opposite - being so fidgety or restless that you have been moving around a lot more than usual Not at all  Thoughts that you would be better off dead, or hurting yourself in some way Not at all  PHQ2-9 Total Score 7  If you checked off any problems, how difficult have these problems made it for you to do your work, take care of things at home, or get along with other people Somewhat difficult  Depression Interventions/Treatment Community Resources Provided    There were no vitals filed for this visit.  Medications Reviewed Today     Reviewed by Merlynn Lyle CROME, LCSW (Social Worker) on 09/17/23 at 1507  Med List Status: <None>   Medication Order Taking? Sig Documenting Provider Last Dose Status Informant  acetaminophen  (TYLENOL ) 325 MG tablet [id number]  Take 650 mg by mouth every 6 (six) hours as needed for moderate pain or headache. [provider]  Active Self, Child, Pharmacy Records  atorvastatin  (LIPITOR) 20 MG tablet [id number]  Take 1 tablet (20 mg total) by mouth daily. Thedora Garnette HERO, MD  Active Self, Child, Pharmacy Records  Blood Glucose Monitoring Suppl DEVI [id number]  1 each by Does not apply route in the morning, at noon, and at bedtime. May substitute to any manufacturer covered by patient's insurance. Thedora Garnette HERO, MD  Active   desonide  (DESOWEN ) 0.05 % cream [id number]  Apply 1 Application topically 2 (two) times daily as needed (dry skin/irritation). Thedora Garnette HERO, MD  Active Self, Child, Pharmacy Records  dicyclomine  (BENTYL ) 20 MG tablet [id number]  TAKE 1 TABLET(20  MG) BY MOUTH THREE TIMES DAILY AFTER MEALS Thedora Garnette HERO, MD  Active   DULoxetine  (CYMBALTA ) 60 MG capsule [id number]  Take 1 capsule (60 mg total) by mouth daily. Thedora Garnette HERO, MD  Active Self, Child, Pharmacy Records  ELIQUIS  5 MG TABS tablet [id number]  TAKE 1 TABLET(5 MG) BY MOUTH TWICE DAILY Thedora Garnette HERO, MD  Active   estradiol  (ESTRACE ) 0.1 MG/GM vaginal cream [id number]  Place 1 Applicatorful vaginally at bedtime for 7 days, THEN 1 Applicatorful 2 (two) times a week. Thedora Garnette HERO, MD  Active   furosemide  (LASIX ) 40 MG tablet [id number]  Take 1 tablet (40 mg total) by mouth daily. Daneen Damien BROCKS, NP  Active Self, Child, Pharmacy Records  glipiZIDE  (GLUCOTROL  XL) 5 MG 24 hr tablet [id number]  Take 1 tablet (5 mg total) by mouth daily. Thedora Garnette HERO, MD  Active Self, Child, Pharmacy Records  Glucose Blood (BLOOD GLUCOSE TEST STRIPS) STRP [id number]  1 each by In Vitro route every morning. May substitute to any  manufacturer covered by [employer]. Thedora Garnette HERO, MD  Active   HYDROcodone -acetaminophen  [facility]) 10-325 MG tablet [id number]  Take 1 tablet by mouth daily as needed for severe pain (pain score 7-10). Thedora Garnette HERO, MD  Active   Lancets (FREESTYLE) lancets [id number]  daily. [provider]  Active   latanoprost  (XALATAN ) 0.005 % ophthalmic solution [id number]  Place 1 drop into both eyes at bedtime.  [provider]  Active Self, Child, Pharmacy Records  metFORMIN (GLUCOPHAGE) 1000 MG tablet [id number]  TAKE 1 TABLET BY MOUTH TWICE DAILY Thedora Garnette HERO, MD  Active   metoprolol  tartrate (LOPRESSOR ) 50 MG tablet [id number]  TAKE 1 TABLET(50 MG) BY MOUTH TWICE DAILY Thedora Garnette HERO, MD  Active   Mouthwashes (BIOTENE/CALCIUM  MT) [id number]  Take 1 Dose by mouth daily as needed (dry mouth).  [provider]  Active Self, Child, Pharmacy Records  Multiple Vitamin (MULTIVITAMIN WITH MINERALS) TABS tablet [id number]  Take 1 tablet by mouth daily. [provider]  Active Self, Child, Pharmacy Records  omeprazole  (PRILOSEC) 40 MG capsule [id number]  TAKE 1 CAPSULE(40 MG) BY MOUTH DAILY Thedora Garnette HERO, MD  Active Self, Child, Pharmacy Records  pregabalin  (LYRICA ) 50 MG capsule [id number]  TAKE 1 CAPSULE(50 MG) BY MOUTH TWICE DAILY Rudd, Garnette HERO, MD  Active Self, Child, Pharmacy Records  psyllium (METAMUCIL SMOOTH TEXTURE) 28 % packet [id number]  Take 1 packet by mouth daily as needed (constipation).  [provider]  Active Self, Child, Pharmacy Records           Med Note TORRIE, LONELL CROME   Mon Oct 07, 2021  8:46 AM)    tirzepatide  (MOUNJARO ) 5 MG/0.5ML Pen [id number]  Inject 5 mg into the skin once a week. Thedora Garnette HERO, MD  Active Self, Child, Pharmacy Records           Med Note [place], [place] R   Thu May 21, 2023  2:39 PM)    traZODone  (DESYREL ) 50 MG tablet [id number]  Take 0.5 tablets (25 mg total) by mouth at bedtime as needed for sleep. Thedora Garnette HERO, MD  Active             Recommendation:   PCP Follow-up Continue Current Plan of Care Review BH resources  Follow Up Plan:   Telephone follow-up in 1 month  Lyle Rung, BSW, MSW, LCSW Licensed Clinical Social Worker [employer]   [facility] [email or web address] Direct Dial: [phone]

## 2023-09-21 ENCOUNTER — Encounter: Payer: Self-pay | Admitting: Family Medicine

## 2023-09-22 ENCOUNTER — Other Ambulatory Visit: Payer: Medicare (Managed Care)

## 2023-09-22 NOTE — Patient Instructions (Signed)
 Visit Information  Thank you for taking time to visit with me today. Please don't hesitate to contact me if I can be of assistance to you before our next scheduled appointment.  Your next care management appointment is by telephone on 10/01/23 at 2:00 pm  Please call the care guide team at [phone] if you need to cancel, schedule, or reschedule an appointment.   Please call the Suicide and Crisis Lifeline: 988 call the [place]  National Suicide Prevention Lifeline: (667) 835-0565 or TTY: 936-157-1209 TTY 772-640-8682) to talk to a trained counselor call 1-800-273-TALK (toll free, 24 hour hotline) if you are experiencing a Mental Health or Behavioral Health Crisis or need someone to talk to.   Heddy Shutter, RN, MSN, BSN, CCM [place]  [facility], Population Health Case Manager Phone: [phone]

## 2023-09-22 NOTE — Patient Outreach (Signed)
 Complex Care Management   Visit Note  09/22/2023  Name:  Jade Ellison MRN: [id number] DOB: 1945/04/26  Situation:  Referral received for Complex Care Management related to Diabetes with Complications I obtained verbal consent from daughter Jade Ellison.  Visit completed with daughter Jade Ellison  on the phone  RNCM called to follow up. Daughter reports she is unable to talk at this time and request to reschedule appointment. RNCM confirmed she is active with LCSW. No problems expressed at this time.  Follow Up Plan:   Telephone follow up appointment date/time:  10/01/23 at 9:00 am  Jade Shutter, RN, MSN, BSN, CCM [place]  [facility], Population Health Case Manager Phone: [phone]

## 2023-10-01 ENCOUNTER — Telehealth: Payer: Medicare (Managed Care)

## 2023-10-01 ENCOUNTER — Other Ambulatory Visit: Payer: Medicare (Managed Care) | Admitting: Licensed Clinical Social Worker

## 2023-10-01 NOTE — Patient Instructions (Signed)
 Visit Information  Thank you for taking time to visit with me today. Please don't hesitate to contact me if I can be of assistance to you before our next scheduled appointment.  Our next appointment is by telephone on 10/29/23 at 9am Please call the care guide team at [phone] if you need to cancel or reschedule your appointment.   Following is a copy of your care plan:   Goals Addressed             This Visit's Progress    VBCI Social Work Care Plan       Problems:   Depression/Anxiety Transportation barriers Caregiver strain  CSW Clinical Goal(s):   Over the next 8 weeks the patient and daughter -Materials engineer will explore community resource options for unmet needs related to Transportation and Depression/Anxiety management  Interventions:  Inter-disciplinary care team collaboration (see longitudinal plan of care) Patient provided patient history.  Assessment of needs, progress, barriers completed. Advised patient to answer all calls from care providers and to keep phone nearby. Advised parents to contact 911 or 988 if a crisis occurs.  Clinical interventions provided:Solution-Focused Strategies, Active listening / Reflection utilized , Problem Solving /Task Center ,  Patient's main support network includes daughter Charleen. [facility] BH resource education provided to family and resources emailed successfully.    Social Determinants of Health in Patient with diabetes : SDOH assessments completed: Transportation Evaluation of current treatment plan related to unmet needs Transportation resources: encouraged pt to contact [employer] for possible wheelchair hitch  Findhelp referral placed for [employer] application received per daughter, referral made today on 10/01/23 to [employer] as transportation barriers continue Stress management resource education provided  Patient Goals/Self-Care Activities:  Contact resources provided for wheelchair transportation  assistance   Plan:   The care management team will reach out to the patient again over the next 30 days.        Please call the Suicide and Crisis Lifeline: 988 call the [place]  National Suicide Prevention Lifeline: 979-536-4836 or TTY: 540-079-0342 TTY 226-706-4240) to talk to a trained counselor go to [facility] [address] [place] [phone]) call 911 if you are experiencing a Mental Health or Behavioral Health Crisis or need someone to talk to.  Patient verbalizes understanding of instructions and care plan provided today and agrees to view in MyChart. Active MyChart status and patient understanding of how to access instructions and care plan via MyChart confirmed with patient.     Lyle Rung, BSW, MSW, LCSW Licensed Clinical Social Worker [employer]   [email or web address] Direct Dial: [phone]

## 2023-10-01 NOTE — Patient Instructions (Signed)
 Orlean LITTIE Pan - I am sorry I was unable to reach you today for our scheduled appointment. I work with Thedora, Garnette HERO, MD and am calling to support your healthcare needs. Please contact me at [phone] at your earliest convenience. I look forward to speaking with you soon.   Thank you,  Heddy Shutter, RN, MSN, BSN, CCM [place]  [facility], Population Health Case Manager Phone: [phone]

## 2023-10-01 NOTE — Patient Outreach (Signed)
 Complex Care Management   Visit Note  10/01/2023  Name:  Jade Ellison MRN: [id number] DOB: 1945-02-13  Situation: Referral received for Complex Care Management related to Mental/Behavioral Health diagnosis depression. I obtained verbal consent from Caregiver.  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 pylori)    Hemorrhoids    Hernia of unspecified site of abdominal cavity without mention of obstruction or gangrene    History of blood transfusion    History of kidney stones    Hypertension 09 07 2013   TRANSTHORACIC ECHO STUDY CONCLUSIONS    Hypertension 09 07 2013   EJECTION FRACTION- 55%-60% .WALL MOTION WAS NORMAL   Impaired mobility    Iron  deficiency anemia    Morbid obesity (HCC)    Neuropathy    Obesity    Olecranon bursitis of left elbow    OSA (obstructive sleep apnea)    On cpap   Osteoarthritis    right hip   Osteoporosis    Primary osteoarthritis    Bilaterally (knee)   PSVT (paroxysmal supraventricular tachycardia) (HCC)    PSVT (paroxysmal supraventricular tachycardia) (HCC)    Short-term memory loss    Shortness of breath dyspnea    Sleep apnea    on CPAP- does not use machine   Spinal headache 1986   Type 2 diabetes mellitus (HCC)     Assessment: Patient Reported Symptoms:  Cognitive Cognitive Status: Struggling with memory recall, Able to follow simple commands, Alert and oriented to person, place,  and time Cognitive/Intellectual Conditions Management [RPT]: None reported or documented in medical history or problem list   Health Maintenance Behaviors: Annual physical exam Healing Pattern: Average Health Facilitated by: Stress management, Rest  Neurological Neurological Review of Symptoms: Other: [place] Neurological Symptoms/Conditions [RPT]: Memory concerns (2 strokes in the past) Neurological Management Strategies: Routine screening, Adequate rest, Coping strategies Neurological Self-Management Outcome: 4 (good)  Psychosocial Psychosocial Symptoms Reported: Sadness - if selected complete PHQ 2-9 Behavioral Management Strategies: Adequate rest, Medication therapy Behavioral Health Self-Management Outcome: 3 (uncertain) Major Change/Loss/Stressor/Fears (CP): Medical condition, self Techniques to Cope with Loss/Stress/Change: Diversional activities, Medication Quality of Family Relationships: involved Do you feel physically threatened by others?: No    10/01/2023    PHQ2-9 Depression Screening   Little interest or pleasure in doing things Not at all  Feeling down, depressed, or hopeless Not at all  PHQ-2 - Total Score 0  Trouble falling or staying asleep, or sleeping too much More than half the days  Feeling tired or having little energy Several days  Poor appetite or overeating  Several days  Feeling bad about yourself - or that you are a failure or have let yourself or your family down Not at all  Trouble concentrating on things, such as reading the  newspaper or watching television Not at all  Moving or speaking so slowly that other people could have noticed.  Or the opposite - being so fidgety or restless that you have been moving around a lot more than usual Not at all  Thoughts that you would be better off dead, or hurting yourself in some way Not at all  PHQ2-9 Total Score 4  If you checked off any problems, how difficult have these problems made it for you to do your work,  take care of things at home, or get along with other people    Depression Interventions/Treatment Community Resources Provided    There were no vitals filed for this visit.  Medications Reviewed Today     Reviewed by Merlynn Lyle CROME, LCSW (Social Worker) on 10/01/23 at 1101  Med List Status: <None>   Medication Order Taking? Sig Documenting Provider Last Dose Status Informant  acetaminophen  (TYLENOL ) 325 MG tablet [id number]  Take 650 mg by mouth every 6 (six) hours as needed for moderate pain or headache. [provider]  Active Self, Child, Pharmacy Records  atorvastatin  (LIPITOR) 20 MG tablet [id number]  Take 1 tablet (20 mg total) by mouth daily. Thedora Garnette HERO, MD  Active Self, Child, Pharmacy Records  Blood Glucose Monitoring Suppl DEVI [id number]  1 each by Does not apply route in the morning, at noon, and at bedtime. May substitute to any manufacturer covered by patient's insurance. Thedora Garnette HERO, MD  Active   desonide  (DESOWEN ) 0.05 % cream [id number]  Apply 1 Application topically 2 (two) times daily as needed (dry skin/irritation). Thedora Garnette HERO, MD  Active Self, Child, Pharmacy Records  dicyclomine  (BENTYL ) 20 MG tablet [id number]  TAKE 1 TABLET(20 MG) BY MOUTH THREE TIMES DAILY AFTER MEALS Thedora Garnette HERO, MD  Active   DULoxetine  (CYMBALTA ) 60 MG capsule [id number]  Take 1 capsule (60 mg total) by mouth daily. Thedora Garnette HERO, MD  Active Self, Child, Pharmacy Records  ELIQUIS  5 MG TABS tablet [id number]  TAKE 1 TABLET(5 MG) BY MOUTH TWICE DAILY Thedora Garnette HERO, MD  Active   estradiol  (ESTRACE ) 0.1 MG/GM vaginal cream [id number]  Place 1 Applicatorful vaginally at bedtime for 7 days, THEN 1 Applicatorful 2 (two) times a week. Thedora Garnette HERO, MD  Active   furosemide  (LASIX ) 40 MG tablet [id number]  Take 1 tablet (40 mg total) by mouth daily. Daneen Damien BROCKS, NP  Expired 09/17/23 2359 Self, Child, Pharmacy Records  glipiZIDE  (GLUCOTROL  XL) 5 MG 24 hr tablet [id number]  Take 1 tablet  (5 mg total) by mouth daily. Thedora Garnette HERO, MD  Active Self, Child, Pharmacy Records  Glucose Blood (BLOOD GLUCOSE TEST STRIPS) STRP [id number]  1 each by In Vitro route every morning. May substitute to any manufacturer covered by patient's insurance. Thedora Garnette HERO, MD  Active   HYDROcodone -acetaminophen  [facility]) 10-325 MG tablet [id number]  Take 1 tablet by mouth daily as needed for severe pain (pain score 7-10). Thedora Garnette HERO, MD  Active   Lancets (FREESTYLE) lancets [id number]  daily. [provider]  Active   latanoprost  (XALATAN ) 0.005 % ophthalmic solution [id number]  Place 1 drop into both eyes at bedtime.  [provider]  Active Self, Child, Pharmacy Records  metFORMIN (GLUCOPHAGE) 1000 MG tablet [id number]  TAKE 1 TABLET BY MOUTH TWICE DAILY Thedora Garnette HERO, MD  Active   metoprolol  tartrate (LOPRESSOR ) 50 MG tablet [id number]  TAKE 1 TABLET(50 MG) BY MOUTH TWICE DAILY Thedora Garnette HERO, MD  Active   Mouthwashes (BIOTENE/CALCIUM  MT) [id number]  Take 1 Dose by mouth daily as needed (dry mouth).  [provider]  Active Self, Child, Pharmacy Records  Multiple Vitamin (MULTIVITAMIN WITH MINERALS) TABS tablet [id number]  Take 1 tablet by mouth daily. [provider]  Active Self, Child, Pharmacy Records  omeprazole  (PRILOSEC) 40 MG capsule [id number]  TAKE 1 CAPSULE(40 MG) BY MOUTH DAILY Thedora Garnette HERO, MD  Active Self, Child, Pharmacy Records  pregabalin  (LYRICA ) 50 MG capsule [id number]  TAKE 1 CAPSULE(50 MG) BY MOUTH TWICE DAILY Rudd, Garnette HERO, MD  Active Self, Child, Pharmacy Records  psyllium (METAMUCIL SMOOTH TEXTURE) 28 % packet [id number]  Take 1 packet by mouth daily as needed (constipation).  [provider]  Active Self, Child, Pharmacy Records           Med Note TORRIE, DANA LITTIE   Mon Oct 07, 2021  8:46 AM)    tirzepatide  (MOUNJARO ) 5 MG/0.5ML Pen [id number]  Inject 5 mg into the skin once a week. Thedora Garnette HERO, MD  Active Self, Child, Pharmacy  Records           Med Note [place], [place] R   Thu May 21, 2023  2:39 PM)    traZODone  (DESYREL ) 50 MG tablet [id number]  Take 0.5 tablets (25 mg total) by mouth at bedtime as needed for sleep. Thedora Garnette HERO, MD  Active             Recommendation:   PCP Follow-up Specialty provider follow-up Return call back to [facility] RNCM (missed appointment this morning) Continue Current Plan of Care  Follow Up Plan:   Telephone follow-up in 1 month  Lyle Rung, BSW, MSW, LCSW Licensed Clinical Social Worker [employer] [facility]   [facility] [email or web address] Direct Dial: [phone]

## 2023-10-08 ENCOUNTER — Other Ambulatory Visit: Payer: Medicare (Managed Care)

## 2023-10-08 NOTE — Patient Outreach (Signed)
 BSW attempted outreach contact twice during scheduled appt without success. BSW tried both phone numbers on file and left a VM requesting a call back.  Upon chart review, this pt is assigned to a clinic that [place] does not manage patients from. BSW will route chart to LCSW Lyle Rung for further instructions.

## 2023-10-08 NOTE — Patient Instructions (Signed)
 Orlean LITTIE Pan - I am sorry I was unable to reach you today for our scheduled appointment. I work with Thedora, Garnette HERO, MD and am calling to support your healthcare needs. Please contact me at [phone] at your earliest convenience. I look forward to speaking with you soon.   Thank you,   Laymon Doll, BSW [place] - [facility] Social Worker [phone]

## 2023-10-15 ENCOUNTER — Other Ambulatory Visit: Payer: Medicare (Managed Care)

## 2023-10-15 NOTE — Patient Instructions (Signed)
 Visit Information  Thank you for taking time to visit with me today. Please don't hesitate to contact me if I can be of assistance to you before our next scheduled appointment.  Your next care management appointment is by telephone on 11/16/23 at 10:00 am   Please call the care guide team at [phone] if you need to cancel, schedule, or reschedule an appointment.   Please call the Suicide and Crisis Lifeline: 988 call the [place]  National Suicide Prevention Lifeline: 629-617-0984 or TTY: (559) 185-7994 TTY 250-298-8105) to talk to a trained counselor if you are experiencing a Mental Health or Behavioral Health Crisis or need someone to talk to.  Heddy Shutter, RN, MSN, BSN, CCM [place]  [facility], Population Health Case Manager Phone: [phone]

## 2023-10-15 NOTE — Patient Outreach (Signed)
 Complex Care Management   Visit Note  10/15/2023  Name:  Jade Ellison MRN: [id number] DOB: [birth year]  Situation: Referral received for Complex Care Management related to DM I obtained verbal consent from [facility] [place](dpr/daughter).  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 pylori)    Hemorrhoids    Hernia of unspecified site of abdominal cavity without mention of obstruction or gangrene    History of blood transfusion    History of kidney stones    Hypertension 09 07 2013   TRANSTHORACIC ECHO STUDY CONCLUSIONS    Hypertension 09 07 2013   EJECTION FRACTION- 55%-60% .WALL MOTION WAS NORMAL   Impaired mobility    Iron  deficiency anemia    Morbid obesity (HCC)    Neuropathy    Obesity    Olecranon bursitis of left elbow    OSA (obstructive sleep apnea)    On cpap   Osteoarthritis    right hip   Osteoporosis    Primary osteoarthritis    Bilaterally (knee)   PSVT (paroxysmal supraventricular tachycardia)    PSVT (paroxysmal supraventricular tachycardia)    Short-term memory loss    Shortness of breath dyspnea    Sleep apnea    on CPAP- does not use machine   Spinal headache 1986   Type 2 diabetes mellitus (HCC)     Assessment: Patient Reported Symptoms:  Cognitive Cognitive Status:  (alert oriented . spoke with daughter)      Neurological Neurological Review of Symptoms: No symptoms reported    HEENT HEENT Symptoms  Reported: No symptoms reported      Cardiovascular Cardiovascular Symptoms Reported: No symptoms reported Does patient have uncontrolled Hypertension?: No Is patient checking Blood Pressure at home?: Yes Patient's Recent BP reading at home: 119/82  HR 80's 2days ago Cardiovascular Management Strategies: Routine screening, Medication therapy  Respiratory Respiratory Symptoms Reported: No symptoms reported    Endocrine Endocrine Symptoms Reported: No symptoms reported Is patient diabetic?: Yes Is patient checking blood sugars at home?: Yes List most recent blood sugar readings, include date and time of day: daughter reports BS checked yesterday,but does nto know the reading. 184 fasting last checked about 2 days ago.    Gastrointestinal Gastrointestinal Symptoms Reported: No symptoms reported      Genitourinary Genitourinary Symptoms Reported: Incontinence Additional Genitourinary Details: denies any signs/symptoms at this time    Integumentary Integumentary Symptoms Reported: No symptoms reported Additional Integumentary Details: keeps barrier cream on bottom. inner thigh little thigh applying desiten cream and keep ing it dry.    Musculoskeletal Musculoskelatal Symptoms Reviewed: Limited mobility        Psychosocial Psychosocial Symptoms Reported: Not assessed          10/15/2023    PHQ2-9 Depression Screening   Little interest or  pleasure in doing things    Feeling down, depressed, or hopeless    PHQ-2 - Total Score    Trouble falling or staying asleep, or sleeping too much    Feeling tired or having little energy    Poor appetite or overeating     Feeling bad about yourself - or that you are a failure or have let yourself or your family down    Trouble concentrating on things, such as reading the newspaper or watching television    Moving or speaking so slowly that other people could have noticed.  Or the opposite - being so fidgety or restless that you have been moving  around a lot more than usual    Thoughts that you would be better off dead, or hurting yourself in some way    PHQ2-9 Total Score    If you checked off any problems, how difficult have these problems made it for you to do your work, take care of things at home, or get along with other people    Depression Interventions/Treatment      There were no vitals filed for this visit.  Medications Reviewed Today     Reviewed by Ericka Marcellus M, RN (Registered Nurse) on 10/15/23 at 1011  Med List Status: <None>   Medication Order Taking? Sig Documenting Provider Last Dose Status Informant  acetaminophen  (TYLENOL ) 325 MG tablet [id number] Yes Take 650 mg by mouth every 6 (six) hours as needed for moderate pain or headache. [provider]  Active Self, Child, Pharmacy Records  atorvastatin  (LIPITOR) 20 MG tablet [id number] Yes Take 1 tablet (20 mg total) by mouth daily. Thedora Garnette HERO, MD  Active Self, Child, Pharmacy Records  Blood Glucose Monitoring Suppl DEVI [id number]  1 each by Does not apply route in the morning, at noon, and at bedtime. May substitute to any manufacturer covered by patient's insurance. Thedora Garnette HERO, MD  Active   desonide  (DESOWEN ) 0.05 % cream [id number] Yes Apply 1 Application topically 2 (two) times daily as needed (dry skin/irritation). Thedora Garnette HERO, MD  Active Self, Child, Pharmacy Records  dicyclomine  (BENTYL ) 20 MG tablet [id number] Yes TAKE 1 TABLET(20 MG) BY MOUTH THREE TIMES DAILY AFTER MEALS Thedora Garnette HERO, MD  Active   DULoxetine  (CYMBALTA ) 60 MG capsule [id number] Yes Take 1 capsule (60 mg total) by mouth daily. Thedora Garnette HERO, MD  Active Self, Child, Pharmacy Records  ELIQUIS  5 MG TABS tablet [id number] Yes TAKE 1 TABLET(5 MG) BY MOUTH TWICE DAILY Thedora Garnette HERO, MD  Active   estradiol  (ESTRACE ) 0.1 MG/GM vaginal cream [id number] Yes Place 1 Applicatorful vaginally at bedtime for 7 days, THEN 1 Applicatorful 2 (two) times a week. Thedora Garnette HERO, MD  Active    furosemide  (LASIX ) 40 MG tablet [id number] Yes Take 1 tablet (40 mg total) by mouth daily. Daneen Damien BROCKS, NP  Active Self, Child, Pharmacy Records  glipiZIDE  (GLUCOTROL  XL) 5 MG 24 hr tablet [id number] Yes Take 1 tablet (5 mg total) by mouth daily. Thedora Garnette HERO, MD  Active Self, Child, Pharmacy Records  Glucose Blood (BLOOD GLUCOSE TEST STRIPS) STRP [id number]  1 each by In Vitro route every morning. May substitute to any manufacturer covered by patient's insurance. Thedora Garnette HERO, MD  Active   HYDROcodone -acetaminophen  [facility]) 10-325 MG tablet [id number] Yes Take 1 tablet by mouth daily as needed for severe pain (pain score 7-10). Thedora Garnette HERO, MD  Active   Lancets (FREESTYLE) lancets [id number]  daily. [provider]  Active   latanoprost  (XALATAN ) 0.005 % ophthalmic solution [id number] Yes Place 1 drop into both eyes at bedtime.  [provider]  Active Self, Child, Pharmacy Records  metFORMIN (GLUCOPHAGE) 1000 MG tablet [id number] Yes TAKE 1 TABLET BY MOUTH TWICE DAILY Thedora Garnette HERO, MD  Active   metoprolol  tartrate (LOPRESSOR ) 50 MG tablet [id number] Yes TAKE 1 TABLET(50 MG) BY MOUTH TWICE DAILY Thedora Garnette HERO, MD  Active   Mouthwashes (BIOTENE/CALCIUM  MT) [id number] Yes Take 1 Dose by mouth daily as needed (dry mouth).  [provider]  Active Self, Child, Pharmacy Records  Multiple Vitamin (MULTIVITAMIN WITH MINERALS) TABS tablet [id number] Yes Take 1 tablet by mouth daily. [provider]  Active Self, Child, Pharmacy Records  omeprazole  (PRILOSEC) 40 MG capsule [id number] Yes TAKE 1 CAPSULE(40 MG) BY MOUTH DAILY Thedora Garnette HERO, MD  Active Self, Child, Pharmacy Records  pregabalin  (LYRICA ) 50 MG capsule [id number] Yes TAKE 1 CAPSULE(50 MG) BY MOUTH TWICE DAILY Rudd, Garnette HERO, MD  Active Self, Child, Pharmacy Records  psyllium (METAMUCIL SMOOTH TEXTURE) 28 % packet [id number] Yes Take 1 packet by mouth daily as needed (constipation).  [provider]   Active Self, Child, Pharmacy Records           Med Note TORRIE, LONELL CROME   Mon Oct 07, 2021  8:46 AM)    tirzepatide  (MOUNJARO ) 5 MG/0.5ML Pen [id number] Yes Inject 5 mg into the skin once a week. Thedora Garnette HERO, MD  Active Self, Child, Pharmacy Records           Med Note [place], [place] R   Thu May 21, 2023  2:39 PM)    traZODone  (DESYREL ) 50 MG tablet [id number] Yes Take 0.5 tablets (25 mg total) by mouth at bedtime as needed for sleep. Thedora Garnette HERO, MD  Active           Recommendation:   Referral to: In home based primary care provider  Follow Up Plan:   Telephone follow up appointment date/time:  11/16/23 at 10:00 am  Heddy Shutter, RN, MSN, BSN, CCM [place]  [facility], Population Health Case Manager Phone: [phone]

## 2023-10-16 NOTE — Patient Outreach (Signed)
 Complex Care Management   Visit Note  10/16/2023  Name:  Jade Ellison MRN: [id number] DOB: Nov 06, 1945  Situation: Care Coordination/Collaboration: RNCM completed referral to [facility] home based primary provider program.  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 pylori)    Hemorrhoids    Hernia of unspecified site of abdominal cavity without mention of obstruction or gangrene    History of blood transfusion    History of kidney stones    Hypertension 09 07 2013   TRANSTHORACIC ECHO STUDY CONCLUSIONS    Hypertension 09 07 2013   EJECTION FRACTION- 55%-60% .WALL MOTION WAS NORMAL   Impaired mobility    Iron  deficiency anemia    Morbid obesity (HCC)    Neuropathy    Obesity    Olecranon bursitis of left elbow    OSA (obstructive sleep apnea)    On cpap   Osteoarthritis    right hip   Osteoporosis    Primary osteoarthritis    Bilaterally (knee)   PSVT (paroxysmal supraventricular tachycardia)    PSVT (paroxysmal supraventricular tachycardia)    Short-term memory loss    Shortness of breath dyspnea    Sleep apnea    on CPAP- does not use machine   Spinal headache 1986   Type 2 diabetes mellitus (HCC)    Assessment: Daughter reports she is unable to get patient out of the house for appointments. Request switch to home base provider. Daughter is aware patient would not be followed by current PCP.  Recommendation:  referral for home based primary care per daughter's  request.  Follow Up Plan:   Telephone follow up appointment date/time:  as previously scheduled  Heddy Shutter, RN, MSN, BSN, CCM [place]  [facility], Population Health Case Manager Phone: [phone]

## 2023-10-19 ENCOUNTER — Telehealth: Payer: Self-pay

## 2023-10-19 DIAGNOSIS — R8290 Unspecified abnormal findings in urine: Secondary | ICD-10-CM

## 2023-10-19 NOTE — Telephone Encounter (Signed)
 Jade Ellison, and she will have her daughter pick up a urine specimen cup and then bring it back. Dm/cma

## 2023-10-19 NOTE — Patient Outreach (Signed)
 Complex Care Management   Visit Note  10/19/2023  Name:  Jade Ellison MRN: [id number] DOB: 09-02-1945  Situation: RNCM received message from patient's daughter to see if [facility] has availlability in there Home based primary care provider program. Per [facility], they have received the referral and are not able to contact patient this week, but will reach out to patient next week. Daughter called and made aware. Daughter expressing concern that patient may have a UTI. She states patient's urine has an odor. She would like to bring in a urine sample to the office to be tested. Daughter states she has called the office and is awaiting a return call. She asked RNCM to also notify PCP.  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 pylori)    Hemorrhoids    Hernia of unspecified site of abdominal cavity without mention of obstruction or gangrene    History of blood transfusion    History of kidney stones    Hypertension 09 07 2013   TRANSTHORACIC ECHO STUDY CONCLUSIONS    Hypertension 09 07 2013   EJECTION FRACTION- 55%-60% .WALL MOTION WAS NORMAL   Impaired mobility    Iron  deficiency anemia    Morbid obesity (HCC)    Neuropathy    Obesity    Olecranon bursitis of left elbow    OSA (obstructive sleep apnea)    On cpap   Osteoarthritis    right hip   Osteoporosis    Primary osteoarthritis    Bilaterally (knee)   PSVT (paroxysmal supraventricular tachycardia)    PSVT (paroxysmal  supraventricular tachycardia)    Short-term memory loss    Shortness of breath dyspnea    Sleep apnea    on CPAP- does not use machine   Spinal headache 1986   Type 2 diabetes mellitus (HCC)     Assessment: Daughter reports concern that patient's urine has an odor. She states patient complains of burning sensation on the outside of skin near pur wik and is appying desiten ointment. Denies fever, but she is concerned about the odor. Daughter reports difficulty getting patient into an office visit.   There were no vitals filed for this visit.  Medications Reviewed Today   Medications were not reviewed in this encounter    Recommendation:   Continue Current Plan of Care  Follow Up Plan:   As previously scheduled  Arnetha Silverthorne, RN, MSN, BSN, CCM [facility], Population Health Case Manager Phone: [phone]

## 2023-10-19 NOTE — Patient Instructions (Signed)
 Visit Information  Thank you for taking time to visit with me today. Please don't hesitate to contact me if I can be of assistance to you before our next scheduled appointment.  Your next care management appointment is by telephone on 11/16/23 at 10:00 am   Please call the care guide team at [phone] if you need to cancel, schedule, or reschedule an appointment.   Please  if you are experiencing a Mental Health or Behavioral Health Crisis or need someone to talk to.  Heddy Shutter, RN, MSN, BSN, CCM [place]  [facility], Population Health Case Manager Phone: [phone]

## 2023-10-19 NOTE — Addendum Note (Signed)
 Addended by: THEDORA GARNETTE HERO on: 10/19/2023 01:40 PM   Modules accepted: Orders

## 2023-10-21 ENCOUNTER — Other Ambulatory Visit: Payer: Medicare (Managed Care)

## 2023-10-21 DIAGNOSIS — R8290 Unspecified abnormal findings in urine: Secondary | ICD-10-CM

## 2023-10-21 NOTE — Addendum Note (Signed)
 Addended by: ALESSANDRA DEDRA PARAS on: 10/21/2023 03:05 PM   Modules accepted: Orders

## 2023-10-22 ENCOUNTER — Ambulatory Visit: Payer: Self-pay | Admitting: Family Medicine

## 2023-10-23 LAB — URINALYSIS W MICROSCOPIC + REFLEX CULTURE
Bilirubin Urine: NEGATIVE
Glucose, UA: NEGATIVE
Hyaline Cast: NONE SEEN /LPF
Ketones, ur: NEGATIVE
Nitrites, Initial: NEGATIVE
Specific Gravity, Urine: 1.009 (ref 1.001–1.035)
pH: >=8.5 — AB (ref 5.0–8.0)

## 2023-10-23 LAB — URINE CULTURE
MICRO NUMBER:: 17074973
SPECIMEN QUALITY:: ADEQUATE

## 2023-10-23 LAB — CULTURE INDICATED

## 2023-10-26 ENCOUNTER — Other Ambulatory Visit: Payer: Self-pay | Admitting: Family Medicine

## 2023-10-26 DIAGNOSIS — K582 Mixed irritable bowel syndrome: Secondary | ICD-10-CM

## 2023-10-26 DIAGNOSIS — E1142 Type 2 diabetes mellitus with diabetic polyneuropathy: Secondary | ICD-10-CM

## 2023-10-26 DIAGNOSIS — E1122 Type 2 diabetes mellitus with diabetic chronic kidney disease: Secondary | ICD-10-CM

## 2023-10-28 NOTE — Telephone Encounter (Signed)
 Called and spoke to [place], patient's daughter, they are in the process of having House calls through [facility] and they will be taking over her medications. Dm/cma

## 2023-10-29 ENCOUNTER — Telehealth: Payer: Medicare (Managed Care) | Admitting: Licensed Clinical Social Worker

## 2023-10-29 ENCOUNTER — Encounter: Payer: Self-pay | Admitting: Licensed Clinical Social Worker

## 2023-10-29 NOTE — Patient Instructions (Signed)
 Jade Ellison - I am sorry I was unable to reach you today for our scheduled appointment. I work with Financial risk analyst, [facility] and am calling to support your healthcare needs. Please contact me at [phone] at your earliest convenience. I look forward to speaking with you soon.   Thank you,  Lyle Rung, BSW, MSW, LCSW Licensed Clinical Social Worker [facility]   [facility] [email or web address] Direct Dial: [phone]

## 2023-11-02 ENCOUNTER — Telehealth: Payer: Self-pay

## 2023-11-02 NOTE — Patient Outreach (Unsigned)
 Complex Care Management   Visit Note  11/02/2023  Name:  Jade Ellison MRN: [id number] DOB: 1945/07/09  Situation: Referral received for Complex Care Management related to {Criteria:32550} I obtained verbal consent from Daughter/dpr, Jade Ellison.  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 pylori)    Hemorrhoids    Hernia of unspecified site of abdominal cavity without mention of obstruction or gangrene    History of blood transfusion    History of kidney stones    Hypertension 09 07 2013   TRANSTHORACIC ECHO STUDY CONCLUSIONS    Hypertension 09 07 2013   EJECTION FRACTION- 55%-60% .WALL MOTION WAS NORMAL   Impaired mobility    Iron  deficiency anemia    Morbid obesity (HCC)    Neuropathy    Obesity    Olecranon bursitis of left elbow    OSA (obstructive sleep apnea)    On cpap   Osteoarthritis    right hip   Osteoporosis    Primary osteoarthritis    Bilaterally (knee)   PSVT (paroxysmal supraventricular tachycardia)    PSVT (paroxysmal supraventricular tachycardia)    Short-term memory loss    Shortness of breath dyspnea    Sleep apnea    on CPAP- does not use machine   Spinal headache 1986   Type 2 diabetes mellitus (HCC)    Assessment: RNCM called to follow up to confirm that patient is active with [facility]. Daughter. RNCM spoke with  daughter Jade Ellison who was very appreciative and thanked [facility] for care management  services and facilitating patient's transition to [facility] home based Primary Care program. RNCM discussed case closure. Daughter encouraged to call if [facility] for patient health questions/concerns. Daughter confirmed she has the contact number. Daughter encouraged to call RNCM if care management needs in the future.  Recommendation:   No recommendations at this time.  Follow Up Plan:   Patient has met all care management goals. Care Management case will be closed. Patient has been provided contact information should new needs arise.   Heddy Shutter, RN, MSN, BSN, CCM [place]  [facility], Population Health Case Manager Phone: [phone]

## 2023-11-03 NOTE — Patient Instructions (Signed)
 Visit Information  Thank you for taking time to visit with me today.   Patient is active with [facility] - home based Primary Care Provider program for ongoing care needs. Patient has met all care management goals-closed from [facility] Care Management at this time..   Please call the Suicide and Crisis Lifeline: 988 call the [place]  National Suicide Prevention Lifeline: (678) 652-2342 or TTY: 719-886-7330 TTY 585 017 6850) to talk to a trained counselor if you are experiencing a Mental Health or Behavioral Health Crisis or need someone to talk to.  Heddy Shutter, RN, MSN, BSN, CCM [place]  [facility], Population Health Case Manager Phone: [phone]

## 2023-11-05 ENCOUNTER — Other Ambulatory Visit: Payer: Self-pay | Admitting: Family Medicine

## 2023-11-05 DIAGNOSIS — E1142 Type 2 diabetes mellitus with diabetic polyneuropathy: Secondary | ICD-10-CM

## 2023-11-16 ENCOUNTER — Telehealth: Payer: Medicare (Managed Care)

## 2024-02-03 ENCOUNTER — Other Ambulatory Visit: Payer: Self-pay | Admitting: Family Medicine

## 2024-02-03 NOTE — Telephone Encounter (Signed)
Left VM to rtn call. Dm/cma       

## 2024-02-09 ENCOUNTER — Telehealth: Payer: Self-pay

## 2024-02-09 NOTE — Telephone Encounter (Signed)
 Pt's daughter Burnard states that pt is now followed by [facility]. They take care of patient's primary care needs (house calls) as well as medication management. Burnard will notify [facility] of the refill request for glipizide .

## 2024-02-18 ENCOUNTER — Other Ambulatory Visit: Payer: Self-pay | Admitting: Family Medicine

## 2024-02-18 DIAGNOSIS — E1142 Type 2 diabetes mellitus with diabetic polyneuropathy: Secondary | ICD-10-CM

## 2024-08-26 ENCOUNTER — Ambulatory Visit: Payer: Medicare (Managed Care)
